# Patient Record
Sex: Female | Born: 1935
Health system: Southern US, Community
[De-identification: ages and names within clinical notes are randomized; demographics above are authoritative.]

## PROBLEM LIST (undated history)

## (undated) DIAGNOSIS — I480 Paroxysmal atrial fibrillation: Secondary | ICD-10-CM

## (undated) DIAGNOSIS — I509 Heart failure, unspecified: Secondary | ICD-10-CM

## (undated) DIAGNOSIS — I10 Essential (primary) hypertension: Secondary | ICD-10-CM

## (undated) DIAGNOSIS — F419 Anxiety disorder, unspecified: Secondary | ICD-10-CM

## (undated) DIAGNOSIS — M8580 Other specified disorders of bone density and structure, unspecified site: Secondary | ICD-10-CM

## (undated) DIAGNOSIS — R0602 Shortness of breath: Secondary | ICD-10-CM

## (undated) DIAGNOSIS — Z7901 Long term (current) use of anticoagulants: Secondary | ICD-10-CM

## (undated) DIAGNOSIS — I313 Pericardial effusion (noninflammatory): Secondary | ICD-10-CM

## (undated) DIAGNOSIS — G459 Transient cerebral ischemic attack, unspecified: Secondary | ICD-10-CM

## (undated) DIAGNOSIS — G4700 Insomnia, unspecified: Secondary | ICD-10-CM

## (undated) HISTORY — DX: Essential (primary) hypertension: I10

## (undated) HISTORY — DX: Long term (current) use of anticoagulants: Z79.01

## (undated) HISTORY — DX: Paroxysmal atrial fibrillation: I48.0

## (undated) HISTORY — PX: APPENDECTOMY: SHX54

## (undated) HISTORY — PX: POLYPECTOMY: SHX149

## (undated) HISTORY — PX: CATARACT EXTRACTION: SUR2

## (undated) HISTORY — DX: Other specified disorders of bone density and structure, unspecified site: M85.80

## (undated) HISTORY — DX: Insomnia, unspecified: G47.00

## (undated) HISTORY — DX: Transient cerebral ischemic attack, unspecified: G45.9

## (undated) HISTORY — PX: OTHER SURGICAL HISTORY: SHX169

---

## 1998-01-21 ENCOUNTER — Other Ambulatory Visit: Admission: RE | Admit: 1998-01-21 | Discharge: 1998-01-21 | Payer: Self-pay | Admitting: *Deleted

## 1998-09-17 ENCOUNTER — Emergency Department (HOSPITAL_COMMUNITY): Admission: EM | Admit: 1998-09-17 | Discharge: 1998-09-17 | Payer: Self-pay | Admitting: *Deleted

## 1998-09-17 ENCOUNTER — Encounter: Payer: Self-pay | Admitting: *Deleted

## 1998-09-23 ENCOUNTER — Ambulatory Visit (HOSPITAL_COMMUNITY): Admission: RE | Admit: 1998-09-23 | Discharge: 1998-09-23 | Payer: Self-pay | Admitting: Orthopedic Surgery

## 1998-09-23 ENCOUNTER — Encounter: Payer: Self-pay | Admitting: Orthopedic Surgery

## 1998-09-25 ENCOUNTER — Encounter: Payer: Self-pay | Admitting: Orthopedic Surgery

## 1998-09-25 ENCOUNTER — Ambulatory Visit (HOSPITAL_COMMUNITY): Admission: RE | Admit: 1998-09-25 | Discharge: 1998-09-25 | Payer: Self-pay | Admitting: Orthopedic Surgery

## 1999-09-02 ENCOUNTER — Encounter: Payer: Self-pay | Admitting: Gynecology

## 1999-09-02 ENCOUNTER — Encounter: Admission: RE | Admit: 1999-09-02 | Discharge: 1999-09-02 | Payer: Self-pay | Admitting: Gynecology

## 2000-02-05 ENCOUNTER — Other Ambulatory Visit: Admission: RE | Admit: 2000-02-05 | Discharge: 2000-02-05 | Payer: Self-pay | Admitting: Gynecology

## 2000-09-02 ENCOUNTER — Encounter: Payer: Self-pay | Admitting: Gynecology

## 2000-09-02 ENCOUNTER — Encounter: Admission: RE | Admit: 2000-09-02 | Discharge: 2000-09-02 | Payer: Self-pay | Admitting: Gynecology

## 2001-02-20 ENCOUNTER — Other Ambulatory Visit: Admission: RE | Admit: 2001-02-20 | Discharge: 2001-02-20 | Payer: Self-pay | Admitting: Gynecology

## 2001-09-06 ENCOUNTER — Encounter: Payer: Self-pay | Admitting: Gynecology

## 2001-09-06 ENCOUNTER — Encounter: Admission: RE | Admit: 2001-09-06 | Discharge: 2001-09-06 | Payer: Self-pay | Admitting: Gynecology

## 2002-03-07 ENCOUNTER — Other Ambulatory Visit: Admission: RE | Admit: 2002-03-07 | Discharge: 2002-03-07 | Payer: Self-pay | Admitting: Gynecology

## 2003-01-07 ENCOUNTER — Encounter: Admission: RE | Admit: 2003-01-07 | Discharge: 2003-01-07 | Payer: Self-pay | Admitting: Gynecology

## 2003-04-15 ENCOUNTER — Other Ambulatory Visit: Admission: RE | Admit: 2003-04-15 | Discharge: 2003-04-15 | Payer: Self-pay | Admitting: Gynecology

## 2003-11-30 ENCOUNTER — Emergency Department (HOSPITAL_COMMUNITY): Admission: EM | Admit: 2003-11-30 | Discharge: 2003-11-30 | Payer: Self-pay | Admitting: Family Medicine

## 2003-12-05 ENCOUNTER — Ambulatory Visit: Payer: Self-pay | Admitting: Cardiology

## 2004-01-02 ENCOUNTER — Ambulatory Visit: Payer: Self-pay | Admitting: Cardiology

## 2004-01-30 ENCOUNTER — Ambulatory Visit: Payer: Self-pay | Admitting: Internal Medicine

## 2004-02-27 ENCOUNTER — Ambulatory Visit: Payer: Self-pay | Admitting: Cardiology

## 2004-03-02 ENCOUNTER — Encounter: Admission: RE | Admit: 2004-03-02 | Discharge: 2004-03-02 | Payer: Self-pay | Admitting: Gynecology

## 2004-03-26 ENCOUNTER — Ambulatory Visit: Payer: Self-pay | Admitting: Cardiology

## 2004-04-20 ENCOUNTER — Ambulatory Visit: Payer: Self-pay | Admitting: Internal Medicine

## 2004-04-23 ENCOUNTER — Ambulatory Visit: Payer: Self-pay | Admitting: Internal Medicine

## 2004-05-18 ENCOUNTER — Ambulatory Visit: Payer: Self-pay | Admitting: Cardiology

## 2004-06-15 ENCOUNTER — Ambulatory Visit: Payer: Self-pay | Admitting: Cardiology

## 2004-07-13 ENCOUNTER — Ambulatory Visit: Payer: Self-pay | Admitting: Cardiology

## 2004-08-05 ENCOUNTER — Ambulatory Visit: Payer: Self-pay | Admitting: Cardiology

## 2004-09-04 ENCOUNTER — Ambulatory Visit: Payer: Self-pay | Admitting: Cardiology

## 2004-09-29 ENCOUNTER — Ambulatory Visit: Payer: Self-pay | Admitting: Cardiology

## 2004-10-22 ENCOUNTER — Ambulatory Visit: Payer: Self-pay | Admitting: Internal Medicine

## 2004-10-29 ENCOUNTER — Ambulatory Visit: Payer: Self-pay | Admitting: Cardiology

## 2004-10-29 ENCOUNTER — Ambulatory Visit: Payer: Self-pay

## 2004-11-26 ENCOUNTER — Ambulatory Visit: Payer: Self-pay | Admitting: Cardiology

## 2004-12-09 ENCOUNTER — Ambulatory Visit: Payer: Self-pay | Admitting: Internal Medicine

## 2004-12-28 ENCOUNTER — Ambulatory Visit: Payer: Self-pay | Admitting: Cardiology

## 2005-01-21 ENCOUNTER — Ambulatory Visit: Payer: Self-pay | Admitting: *Deleted

## 2005-02-03 ENCOUNTER — Ambulatory Visit: Payer: Self-pay | Admitting: Internal Medicine

## 2005-02-04 ENCOUNTER — Ambulatory Visit: Payer: Self-pay | Admitting: Cardiology

## 2005-02-18 ENCOUNTER — Ambulatory Visit: Payer: Self-pay | Admitting: Cardiology

## 2005-03-18 ENCOUNTER — Ambulatory Visit: Payer: Self-pay | Admitting: Cardiology

## 2005-03-22 ENCOUNTER — Encounter: Admission: RE | Admit: 2005-03-22 | Discharge: 2005-03-22 | Payer: Self-pay | Admitting: Gynecology

## 2005-04-08 ENCOUNTER — Ambulatory Visit: Payer: Self-pay | Admitting: Cardiology

## 2005-05-07 ENCOUNTER — Ambulatory Visit: Payer: Self-pay | Admitting: Cardiology

## 2005-06-04 ENCOUNTER — Ambulatory Visit: Payer: Self-pay | Admitting: Internal Medicine

## 2005-06-16 ENCOUNTER — Ambulatory Visit: Payer: Self-pay | Admitting: Internal Medicine

## 2005-06-16 ENCOUNTER — Ambulatory Visit: Payer: Self-pay | Admitting: Cardiology

## 2005-07-12 ENCOUNTER — Other Ambulatory Visit: Admission: RE | Admit: 2005-07-12 | Discharge: 2005-07-12 | Payer: Self-pay | Admitting: Gynecology

## 2005-07-14 ENCOUNTER — Ambulatory Visit: Payer: Self-pay | Admitting: *Deleted

## 2005-07-30 ENCOUNTER — Ambulatory Visit: Payer: Self-pay | Admitting: Internal Medicine

## 2005-08-06 ENCOUNTER — Ambulatory Visit: Payer: Self-pay | Admitting: Cardiology

## 2005-08-20 ENCOUNTER — Ambulatory Visit: Payer: Self-pay | Admitting: Cardiology

## 2005-09-03 ENCOUNTER — Ambulatory Visit: Payer: Self-pay | Admitting: Cardiology

## 2005-09-20 ENCOUNTER — Ambulatory Visit: Payer: Self-pay | Admitting: Cardiology

## 2005-10-11 ENCOUNTER — Ambulatory Visit: Payer: Self-pay | Admitting: Cardiology

## 2005-11-01 ENCOUNTER — Ambulatory Visit: Payer: Self-pay | Admitting: Internal Medicine

## 2005-11-08 ENCOUNTER — Ambulatory Visit: Payer: Self-pay | Admitting: Cardiology

## 2005-11-18 ENCOUNTER — Ambulatory Visit: Payer: Self-pay | Admitting: Internal Medicine

## 2005-12-06 ENCOUNTER — Ambulatory Visit: Payer: Self-pay | Admitting: Cardiovascular Disease

## 2005-12-23 ENCOUNTER — Ambulatory Visit: Payer: Self-pay | Admitting: Internal Medicine

## 2006-01-06 ENCOUNTER — Ambulatory Visit: Payer: Self-pay | Admitting: Internal Medicine

## 2006-01-20 ENCOUNTER — Ambulatory Visit: Payer: Self-pay | Admitting: Cardiology

## 2006-02-01 ENCOUNTER — Ambulatory Visit: Payer: Self-pay | Admitting: Internal Medicine

## 2006-02-14 ENCOUNTER — Ambulatory Visit: Payer: Self-pay | Admitting: Internal Medicine

## 2006-02-17 ENCOUNTER — Ambulatory Visit: Payer: Self-pay | Admitting: Internal Medicine

## 2006-03-17 ENCOUNTER — Ambulatory Visit: Payer: Self-pay | Admitting: Cardiology

## 2006-04-18 ENCOUNTER — Ambulatory Visit: Payer: Self-pay | Admitting: Cardiology

## 2006-05-03 ENCOUNTER — Encounter: Admission: RE | Admit: 2006-05-03 | Discharge: 2006-05-03 | Payer: Self-pay | Admitting: Internal Medicine

## 2006-05-11 ENCOUNTER — Encounter: Admission: RE | Admit: 2006-05-11 | Discharge: 2006-05-11 | Payer: Self-pay | Admitting: Orthopedic Surgery

## 2006-05-17 ENCOUNTER — Ambulatory Visit: Payer: Self-pay | Admitting: Cardiology

## 2006-06-16 ENCOUNTER — Ambulatory Visit: Payer: Self-pay | Admitting: Cardiology

## 2006-06-16 ENCOUNTER — Ambulatory Visit: Payer: Self-pay | Admitting: Internal Medicine

## 2006-07-01 ENCOUNTER — Ambulatory Visit: Payer: Self-pay | Admitting: Cardiology

## 2006-07-28 ENCOUNTER — Ambulatory Visit: Payer: Self-pay | Admitting: Internal Medicine

## 2006-08-25 ENCOUNTER — Ambulatory Visit: Payer: Self-pay | Admitting: Cardiology

## 2006-09-22 ENCOUNTER — Ambulatory Visit: Payer: Self-pay | Admitting: Cardiology

## 2006-10-20 ENCOUNTER — Ambulatory Visit: Payer: Self-pay | Admitting: Cardiology

## 2006-11-03 ENCOUNTER — Encounter: Payer: Self-pay | Admitting: *Deleted

## 2006-11-03 DIAGNOSIS — N809 Endometriosis, unspecified: Secondary | ICD-10-CM | POA: Insufficient documentation

## 2006-11-03 DIAGNOSIS — M949 Disorder of cartilage, unspecified: Secondary | ICD-10-CM

## 2006-11-03 DIAGNOSIS — I10 Essential (primary) hypertension: Secondary | ICD-10-CM | POA: Insufficient documentation

## 2006-11-03 DIAGNOSIS — M899 Disorder of bone, unspecified: Secondary | ICD-10-CM | POA: Insufficient documentation

## 2006-11-03 DIAGNOSIS — G4700 Insomnia, unspecified: Secondary | ICD-10-CM | POA: Insufficient documentation

## 2006-11-03 DIAGNOSIS — Z9189 Other specified personal risk factors, not elsewhere classified: Secondary | ICD-10-CM | POA: Insufficient documentation

## 2006-11-03 DIAGNOSIS — I4891 Unspecified atrial fibrillation: Secondary | ICD-10-CM | POA: Insufficient documentation

## 2006-11-17 ENCOUNTER — Ambulatory Visit: Payer: Self-pay | Admitting: Cardiology

## 2006-11-23 ENCOUNTER — Ambulatory Visit: Payer: Self-pay | Admitting: Internal Medicine

## 2006-12-01 ENCOUNTER — Ambulatory Visit: Payer: Self-pay | Admitting: Cardiology

## 2006-12-29 ENCOUNTER — Ambulatory Visit: Payer: Self-pay | Admitting: Cardiovascular Disease

## 2007-01-30 ENCOUNTER — Ambulatory Visit: Payer: Self-pay | Admitting: Cardiology

## 2007-02-01 ENCOUNTER — Encounter: Payer: Self-pay | Admitting: Internal Medicine

## 2007-02-07 ENCOUNTER — Encounter: Admission: RE | Admit: 2007-02-07 | Discharge: 2007-02-07 | Payer: Self-pay | Admitting: Surgery

## 2007-02-13 ENCOUNTER — Ambulatory Visit: Payer: Self-pay | Admitting: Cardiology

## 2007-02-27 ENCOUNTER — Ambulatory Visit: Payer: Self-pay | Admitting: Internal Medicine

## 2007-03-30 ENCOUNTER — Ambulatory Visit: Payer: Self-pay | Admitting: Cardiovascular Disease

## 2007-04-27 ENCOUNTER — Ambulatory Visit: Payer: Self-pay | Admitting: Internal Medicine

## 2007-05-03 ENCOUNTER — Ambulatory Visit: Payer: Self-pay | Admitting: Cardiovascular Disease

## 2007-05-16 ENCOUNTER — Ambulatory Visit: Payer: Self-pay | Admitting: Cardiology

## 2007-06-06 ENCOUNTER — Ambulatory Visit: Payer: Self-pay | Admitting: Cardiovascular Disease

## 2007-06-12 ENCOUNTER — Encounter: Admission: RE | Admit: 2007-06-12 | Discharge: 2007-06-12 | Payer: Self-pay | Admitting: Gynecology

## 2007-06-15 ENCOUNTER — Ambulatory Visit: Payer: Self-pay | Admitting: Internal Medicine

## 2007-06-16 ENCOUNTER — Ambulatory Visit: Payer: Self-pay | Admitting: Internal Medicine

## 2007-06-16 DIAGNOSIS — R197 Diarrhea, unspecified: Secondary | ICD-10-CM | POA: Insufficient documentation

## 2007-06-20 ENCOUNTER — Encounter: Payer: Self-pay | Admitting: Internal Medicine

## 2007-06-22 ENCOUNTER — Telehealth: Payer: Self-pay | Admitting: Internal Medicine

## 2007-06-27 ENCOUNTER — Encounter: Payer: Self-pay | Admitting: Internal Medicine

## 2007-07-04 ENCOUNTER — Ambulatory Visit: Payer: Self-pay | Admitting: Internal Medicine

## 2007-07-18 ENCOUNTER — Ambulatory Visit: Payer: Self-pay | Admitting: Cardiovascular Disease

## 2007-08-08 ENCOUNTER — Ambulatory Visit: Payer: Self-pay | Admitting: Internal Medicine

## 2007-08-21 ENCOUNTER — Ambulatory Visit: Payer: Self-pay | Admitting: Cardiovascular Disease

## 2007-08-30 ENCOUNTER — Ambulatory Visit: Payer: Self-pay | Admitting: Internal Medicine

## 2007-08-30 DIAGNOSIS — R1310 Dysphagia, unspecified: Secondary | ICD-10-CM | POA: Insufficient documentation

## 2007-09-04 ENCOUNTER — Ambulatory Visit: Payer: Self-pay | Admitting: Cardiology

## 2007-09-20 ENCOUNTER — Telehealth (INDEPENDENT_AMBULATORY_CARE_PROVIDER_SITE_OTHER): Payer: Self-pay | Admitting: *Deleted

## 2007-09-21 ENCOUNTER — Encounter: Payer: Self-pay | Admitting: Internal Medicine

## 2007-09-25 ENCOUNTER — Ambulatory Visit: Payer: Self-pay | Admitting: Cardiology

## 2007-09-26 ENCOUNTER — Encounter: Payer: Self-pay | Admitting: Internal Medicine

## 2007-09-26 ENCOUNTER — Ambulatory Visit (HOSPITAL_COMMUNITY): Admission: RE | Admit: 2007-09-26 | Discharge: 2007-09-26 | Payer: Self-pay | Admitting: Internal Medicine

## 2007-09-28 ENCOUNTER — Encounter: Payer: Self-pay | Admitting: Internal Medicine

## 2007-10-03 ENCOUNTER — Encounter (INDEPENDENT_AMBULATORY_CARE_PROVIDER_SITE_OTHER): Payer: Self-pay | Admitting: *Deleted

## 2007-10-23 ENCOUNTER — Ambulatory Visit: Payer: Self-pay | Admitting: Cardiovascular Disease

## 2007-10-27 ENCOUNTER — Ambulatory Visit: Payer: Self-pay | Admitting: Gastroenterology

## 2007-11-03 ENCOUNTER — Ambulatory Visit: Payer: Self-pay | Admitting: Cardiology

## 2007-11-15 ENCOUNTER — Telehealth: Payer: Self-pay | Admitting: Internal Medicine

## 2007-11-17 ENCOUNTER — Ambulatory Visit: Payer: Self-pay | Admitting: Cardiology

## 2007-12-08 ENCOUNTER — Ambulatory Visit: Payer: Self-pay | Admitting: Internal Medicine

## 2007-12-15 ENCOUNTER — Ambulatory Visit: Payer: Self-pay | Admitting: Cardiovascular Disease

## 2008-01-04 ENCOUNTER — Ambulatory Visit: Payer: Self-pay | Admitting: Cardiology

## 2008-02-01 ENCOUNTER — Ambulatory Visit: Payer: Self-pay | Admitting: Cardiovascular Disease

## 2008-02-21 ENCOUNTER — Telehealth: Payer: Self-pay | Admitting: Internal Medicine

## 2008-02-22 ENCOUNTER — Ambulatory Visit: Payer: Self-pay | Admitting: Internal Medicine

## 2008-02-22 DIAGNOSIS — R310 Gross hematuria: Secondary | ICD-10-CM | POA: Insufficient documentation

## 2008-02-22 LAB — CONVERTED CEMR LAB
Ketones, ur: NEGATIVE mg/dL
Urine Glucose: NEGATIVE mg/dL
Urobilinogen, UA: 0.2 (ref 0.0–1.0)

## 2008-02-27 ENCOUNTER — Encounter: Payer: Self-pay | Admitting: Internal Medicine

## 2008-02-29 ENCOUNTER — Ambulatory Visit: Payer: Self-pay | Admitting: Internal Medicine

## 2008-03-22 ENCOUNTER — Encounter: Payer: Self-pay | Admitting: Internal Medicine

## 2008-03-28 ENCOUNTER — Ambulatory Visit: Payer: Self-pay | Admitting: Cardiovascular Disease

## 2008-04-23 ENCOUNTER — Ambulatory Visit: Payer: Self-pay | Admitting: Cardiology

## 2008-05-21 ENCOUNTER — Ambulatory Visit: Payer: Self-pay | Admitting: Cardiology

## 2008-06-04 ENCOUNTER — Ambulatory Visit: Payer: Self-pay | Admitting: Internal Medicine

## 2008-06-18 ENCOUNTER — Encounter: Admission: RE | Admit: 2008-06-18 | Discharge: 2008-06-18 | Payer: Self-pay | Admitting: Gynecology

## 2008-06-18 ENCOUNTER — Ambulatory Visit: Payer: Self-pay | Admitting: Cardiology

## 2008-06-25 ENCOUNTER — Encounter: Payer: Self-pay | Admitting: *Deleted

## 2008-07-09 ENCOUNTER — Encounter (INDEPENDENT_AMBULATORY_CARE_PROVIDER_SITE_OTHER): Payer: Self-pay | Admitting: Cardiology

## 2008-07-09 ENCOUNTER — Ambulatory Visit: Payer: Self-pay | Admitting: Internal Medicine

## 2008-07-09 LAB — CONVERTED CEMR LAB: Protime: 17.1

## 2008-07-23 ENCOUNTER — Ambulatory Visit: Payer: Self-pay | Admitting: Internal Medicine

## 2008-07-31 ENCOUNTER — Encounter: Payer: Self-pay | Admitting: *Deleted

## 2008-08-08 ENCOUNTER — Ambulatory Visit: Payer: Self-pay | Admitting: Cardiology

## 2008-08-08 LAB — CONVERTED CEMR LAB
POC INR: 3.1
Prothrombin Time: 21.4 s

## 2008-09-04 ENCOUNTER — Encounter: Payer: Self-pay | Admitting: Internal Medicine

## 2008-09-05 ENCOUNTER — Ambulatory Visit: Payer: Self-pay | Admitting: Internal Medicine

## 2008-10-03 ENCOUNTER — Ambulatory Visit: Payer: Self-pay | Admitting: Cardiovascular Disease

## 2008-10-03 LAB — CONVERTED CEMR LAB: POC INR: 3.5

## 2008-10-31 ENCOUNTER — Ambulatory Visit: Payer: Self-pay | Admitting: Internal Medicine

## 2008-10-31 LAB — CONVERTED CEMR LAB: POC INR: 2.2

## 2008-11-18 ENCOUNTER — Ambulatory Visit: Payer: Self-pay | Admitting: Internal Medicine

## 2008-11-27 ENCOUNTER — Ambulatory Visit: Payer: Self-pay | Admitting: Cardiology

## 2008-12-26 ENCOUNTER — Ambulatory Visit: Payer: Self-pay | Admitting: Cardiovascular Disease

## 2009-01-22 ENCOUNTER — Ambulatory Visit: Payer: Self-pay | Admitting: Cardiology

## 2009-02-18 ENCOUNTER — Ambulatory Visit: Payer: Self-pay | Admitting: Internal Medicine

## 2009-02-19 ENCOUNTER — Ambulatory Visit: Payer: Self-pay | Admitting: Cardiology

## 2009-03-19 ENCOUNTER — Ambulatory Visit: Payer: Self-pay | Admitting: Internal Medicine

## 2009-04-01 ENCOUNTER — Encounter: Payer: Self-pay | Admitting: Internal Medicine

## 2009-04-16 ENCOUNTER — Ambulatory Visit: Payer: Self-pay | Admitting: Internal Medicine

## 2009-05-14 ENCOUNTER — Ambulatory Visit: Payer: Self-pay | Admitting: Cardiology

## 2009-05-14 LAB — CONVERTED CEMR LAB: POC INR: 2

## 2009-06-10 ENCOUNTER — Ambulatory Visit: Payer: Self-pay | Admitting: Internal Medicine

## 2009-06-10 LAB — CONVERTED CEMR LAB: POC INR: 2.4

## 2009-06-25 ENCOUNTER — Encounter: Admission: RE | Admit: 2009-06-25 | Discharge: 2009-06-25 | Payer: Self-pay | Admitting: Gynecology

## 2009-07-09 ENCOUNTER — Ambulatory Visit: Payer: Self-pay | Admitting: Internal Medicine

## 2009-08-06 ENCOUNTER — Ambulatory Visit: Payer: Self-pay | Admitting: Internal Medicine

## 2009-08-06 LAB — CONVERTED CEMR LAB: POC INR: 2.2

## 2009-08-11 ENCOUNTER — Ambulatory Visit: Payer: Self-pay | Admitting: Internal Medicine

## 2009-08-11 LAB — CONVERTED CEMR LAB
Basophils Relative: 0.5 % (ref 0.0–3.0)
Calcium: 9.5 mg/dL (ref 8.4–10.5)
GFR calc non Af Amer: 87.48 mL/min (ref >60)
Hemoglobin: 14.6 g/dL (ref 12.0–15.0)
Lymphocytes Relative: 33.4 % (ref 12.0–46.0)
Monocytes Relative: 8.4 % (ref 3.0–12.0)
Neutro Abs: 4 10*3/uL (ref 1.4–7.7)
RBC: 4.2 M/uL (ref 3.87–5.11)
Sodium: 141 meq/L (ref 135–145)
TSH: 0.96 microintl units/mL (ref 0.35–5.50)
Vitamin B-12: >1500 pg/mL — ABNORMAL HIGH (ref 211–911)

## 2009-09-04 ENCOUNTER — Ambulatory Visit: Payer: Self-pay | Admitting: Cardiovascular Disease

## 2009-09-22 ENCOUNTER — Ambulatory Visit: Payer: Self-pay | Admitting: Cardiology

## 2009-09-22 LAB — CONVERTED CEMR LAB: POC INR: 2.3

## 2009-10-20 ENCOUNTER — Ambulatory Visit: Payer: Self-pay | Admitting: Cardiovascular Disease

## 2009-11-17 ENCOUNTER — Ambulatory Visit: Payer: Self-pay | Admitting: Internal Medicine

## 2009-12-08 ENCOUNTER — Telehealth: Payer: Self-pay | Admitting: Internal Medicine

## 2009-12-15 ENCOUNTER — Ambulatory Visit: Payer: Self-pay | Admitting: Cardiovascular Disease

## 2009-12-15 LAB — CONVERTED CEMR LAB: POC INR: 2.1

## 2010-01-09 ENCOUNTER — Telehealth: Payer: Self-pay | Admitting: Internal Medicine

## 2010-01-09 ENCOUNTER — Ambulatory Visit: Payer: Self-pay | Admitting: Internal Medicine

## 2010-01-09 DIAGNOSIS — F341 Dysthymic disorder: Secondary | ICD-10-CM | POA: Insufficient documentation

## 2010-01-12 ENCOUNTER — Ambulatory Visit: Payer: Self-pay | Admitting: Internal Medicine

## 2010-01-12 LAB — CONVERTED CEMR LAB: INR: 2.3

## 2010-02-09 ENCOUNTER — Ambulatory Visit: Admission: RE | Admit: 2010-02-09 | Discharge: 2010-02-09 | Payer: Self-pay | Source: Home / Self Care

## 2010-02-15 ENCOUNTER — Encounter: Payer: Self-pay | Admitting: Orthopedic Surgery

## 2010-02-24 NOTE — Assessment & Plan Note (Signed)
Summary: 1 year return.amber      Allergies Added: NKDA  Primary Provider:  Norins  CC:  1 year return/  Pt feeling good but is unhappy with weight gain.  Marland Kitchen  History of Present Illness: Selena Taylor is seen in followup for paroxysmal atrial fibrillation which is largely quiet.   They have recently moved to [facility] loft.He is in respite care at [facility].  she is adjusting to her husband's dementia and comes in   much more relaxed.  she had no palpitations and has no problems with chest pain shortness of breath or peripheral edema  Current Medications (verified): 1)  Coumadin 5 Mg  Tabs (Warfarin Sodium) .... Take As Directed By Coumadin Clinic. 2)  Altace 5 Mg  Tabs (Ramipril) .... Take One Tablet Once Daily 3)  Cardizem Cd 240 Mg  Cp24 (Diltiazem Hcl Coated Beads) .... Take One Tablet Once Daily 4)  Multivitamins   Tabs (Multiple Vitamin) .... Take One Tablet Once Daily 5)  Fish Oil 1000 Mg  Caps (Omega-3 Fatty Acids) .... Take One Capsule Daily 6)  Vitamin B .... Take One Tablet Once Daily 7)  Grape Seed .... Take One Tablet Once Daily 8)  Calcium 500 500 Mg  Tabs (Calcium Carbonate) .... Take One Tablet Once Daily 9)  Vitamin D 1000 Unit  Tabs (Cholecalciferol) .... Take One Tablet Once Daily 10)  Ambien 10 Mg  Tabs (Zolpidem Tartrate) .... Take One Tablet At Bedtime  Allergies (verified): No Known Drug Allergies  Past History:  Past Medical History: Last updated: 06/03/2008 Current Problems:  TRANSIENT ISCHEMIC ATTACKS, HX OF (ICD-V12.50) PAROXYSMAL ATRIAL FIBRILLATION (ICD-427.31) HYPERTENSION (ICD-401.9) GROSS HEMATURIA (ICD-599.71) DYSPHAGIA (ICD-787.20) DIARRHEA (ICD-787.91) POLYPECTOMY, HX OF (ICD-V15.9) Hx of ENDOMETRIOSIS (ICD-617.9) OSTEOPENIA (ICD-733.90) INSOMNIA, CHRONIC (ICD-307.42) TOTAL HYSTERECTOMY AND BILATERAL SALPINGOOPHERECTOMY, HX OF (ICD-V45.77)  Past Surgical History: Last updated: 10/27/2007 POLYPECTOMY, HX OF (ICD-V15.9) TOTAL HYSTERECTOMY  AND BILATERAL SALPINGOOPHERECTOMY, HX OF (ICD-V45.77) Appendectomy Skin Cancer Removal  Family History: Last updated: 10/27/2007 Myloma: Mother No FH of Colon Cancer:  Social History: Last updated: 10/27/2007 Occupation: Retired Patient has never smoked.  Alcohol Use - yes -wine Illicit Drug Use - no  Vital Signs:  Patient profile:   75 year old female Height:      62 inches Weight:      127 pounds BMI:     23.31 Pulse rate:   69 / minute Pulse rhythm:   regular BP sitting:   128 / 72  (left arm) Cuff size:   regular  Vitals Entered By: Selena Taylor CMA (Jun 10, 2009 10:51 AM)  Physical Exam  General:  The patient was alert and oriented in no acute distress. HEENT Normal.  Neck veins were flat, carotids were brisk.  Lungs were clear.  Heart sounds were regular without murmurs or gallops.  Abdomen was soft with active bowel sounds. There is no clubbing cyanosis or edema. Skin Warm and dry    EKG  Procedure date:  06/10/2009  Findings:      sinus rhythm at 69 Intervals 0.14/0.08/0.42 Axis LX Isolated PVC  EKG  Procedure date:  06/10/2009  Findings:      again isolated PAC  Impression & Recommendations:  Problem # 1:  PAROXYSMAL ATRIAL FIBRILLATION (ICD-427.31) the patient is maintaining sinus rhythm as best as we know. Her prior TIA she is on Coumadin and tolerating that well.  One issue that we will need to discuss his whether she would like take Pradaxa. Her updated medication  list for this problem includes:    Coumadin 5 Mg Tabs (Warfarin sodium) .Marland Kitchen... Take as directed by coumadin clinic.  Orders: EKG w/ Interpretation (93000)  Problem # 2:  TRANSIENT ISCHEMIC ATTACKS, HX OF (ICD-V12.50) as above  Patient Instructions: 1)  Your physician wants you to follow-up in:  12 months with Dr Graciela Husbands. You will receive a reminder letter in the mail two months in advance. If you don't receive a letter, please call our office to schedule the follow-up  appointment.

## 2010-02-24 NOTE — Medication Information (Signed)
Summary: rov/ez  Anticoagulant Therapy  Managed by: Eda Keys, PharmD Referring MD: Sherryl Manges MD PCP: Link Snuffer MD: Ladona Ridgel MD, Sharlot Gowda Indication 1: Atrial Fibrillation (ICD-427.31) Lab Used: [facility] Site: [facility] INR POC 2.5 INR RANGE 2 - 3  Dietary changes: no    Health status changes: no    Bleeding/hemorrhagic complications: no    Recent/future hospitalizations: no    Any changes in medication regimen? no    Recent/future dental: no  Any missed doses?: no       Is patient compliant with meds? yes       Allergies: No Known Drug Allergies  Anticoagulation Management History:      The patient is taking warfarin and comes in today for a routine follow up visit.  Positive risk factors for bleeding include an age of 33 years or older.  The bleeding index is 'intermediate risk'.  Positive CHADS2 values include History of HTN.  Negative CHADS2 values include Age > 84 years old.  The start date was 01/31/2001.  Anticoagulation responsible provider: Ladona Ridgel MD, Sharlot Gowda.  INR POC: 2.5.  Cuvette Lot#: [id number].  Exp: 04/2010.    Anticoagulation Management Assessment/Plan:      The patient's current anticoagulation dose is Coumadin 5 mg  tabs: Take as directed by coumadin clinic..  The target INR is 2.0-3.0.  The next INR is due 04/16/2009.  Anticoagulation instructions were given to patient.  Results were reviewed/authorized by Eda Keys, PharmD.  She was notified by Eda Keys.         Prior Anticoagulation Instructions: INR: 2.0 Take extra 1/2 tablet tomorrow then resume to same dosage of 1/2 tablet daily except 1 tablet on Mondays, Wednesdays and Fridays Recheck in 4 weeks  Current Anticoagulation Instructions: INR 2.5  Continue taking 1 tablet on Monday, Wednesday, and Friday and take 1/2 tablet all other days.  Return to clinic in 4 weeks.

## 2010-02-24 NOTE — Medication Information (Signed)
Summary: rov/sp      Allergies Added: NKDA Anticoagulant Therapy  Managed by: Earvin Hansen, Pharm D Referring MD: Sherryl Manges MD PCP: Link Snuffer MD: Excell Seltzer MD, Casimiro Needle Indication 1: Atrial Fibrillation (ICD-427.31) Lab Used: [facility] Site: [facility] INR POC 2.6 INR RANGE 2 - 3  Dietary changes: no    Health status changes: no    Bleeding/hemorrhagic complications: no    Recent/future hospitalizations: no    Any changes in medication regimen? no    Recent/future dental: no  Any missed doses?: no       Is patient compliant with meds? yes       Current Medications (verified): 1)  Coumadin 5 Mg  Tabs (Warfarin Sodium) .... Take As Directed By Coumadin Clinic. 2)  Altace 5 Mg  Tabs (Ramipril) .... Take One Tablet Once Daily 3)  Cardizem Cd 240 Mg  Cp24 (Diltiazem Hcl Coated Beads) .... Take One Tablet Once Daily 4)  Multivitamins   Tabs (Multiple Vitamin) .... Take One Tablet Once Daily 5)  Fish Oil 1000 Mg  Caps (Omega-3 Fatty Acids) .... Take One Capsule Daily 6)  Vitamin B .... Take One Tablet Once Daily 7)  Grape Seed .... Take One Tablet Once Daily 8)  Calcium 500 500 Mg  Tabs (Calcium Carbonate) .... Take One Tablet Once Daily 9)  Vitamin D 1000 Unit  Tabs (Cholecalciferol) .... Take One Tablet Once Daily 10)  Ambien 10 Mg  Tabs (Zolpidem Tartrate) .... Take One Tablet At Bedtime  Allergies (verified): No Known Drug Allergies  Anticoagulation Management History:      Positive risk factors for bleeding include an age of 72 years or older.  The bleeding index is 'intermediate risk'.  Positive CHADS2 values include History of HTN.  Negative CHADS2 values include Age > 61 years old.  The start date was 01/31/2001.  Anticoagulation responsible provider: Excell Seltzer MD, Casimiro Needle.  INR POC: 2.6.  Exp: 10/2010.    Anticoagulation Management Assessment/Plan:      The patient's current anticoagulation dose is Coumadin 5 mg  tabs: Take as directed by coumadin  clinic..  The target INR is 2.0-3.0.  The next INR is due 11/17/2009.  Anticoagulation instructions were given to patient.  Results were reviewed/authorized by Earvin Hansen, Pharm D.  She was notified by Earvin Hansen PharmD.         Prior Anticoagulation Instructions: INR 2.3  Continue taking 1/2 tablet (2.5mg ) every day except take 1 tablet (5mg ) on Mondays, Wedensdays, and Fridays.  Recheck 9/26.   Current Anticoagulation Instructions: Continue taking 1/2 tablet (2.5 mg) daily except for 1 tablet (5 mg) on Mondays, Wednesdays, and Fridays.

## 2010-02-24 NOTE — Medication Information (Signed)
Summary: rov/tm  Anticoagulant Therapy  Managed by: Bethena Midget, RN, BSN Referring MD: Sherryl Manges MD PCP: Link Snuffer MD: Johney Frame MD, Fayrene Fearing Indication 1: Atrial Fibrillation (ICD-427.31) Lab Used: [facility] Site: [facility] INR POC 2.2 INR RANGE 2 - 3  Dietary changes: no    Health status changes: no    Bleeding/hemorrhagic complications: no    Recent/future hospitalizations: no    Any changes in medication regimen? no    Recent/future dental: no  Any missed doses?: no       Is patient compliant with meds? yes       Allergies: No Known Drug Allergies  Anticoagulation Management History:      The patient is taking warfarin and comes in today for a routine follow up visit.  Positive risk factors for bleeding include an age of 73 years or older.  The bleeding index is 'intermediate risk'.  Positive CHADS2 values include History of HTN.  Negative CHADS2 values include Age > 5 years old.  The start date was 01/31/2001.  Anticoagulation responsible provider: Annlouise Gerety MD, Fayrene Fearing.  INR POC: 2.2.  Cuvette Lot#: [id number].  Exp: 09/2010.    Anticoagulation Management Assessment/Plan:      The patient's current anticoagulation dose is Coumadin 5 mg  tabs: Take as directed by coumadin clinic..  The target INR is 2.0-3.0.  The next INR is due 09/03/2009.  Anticoagulation instructions were given to patient.  Results were reviewed/authorized by Bethena Midget, RN, BSN.  She was notified by Bethena Midget, RN, BSN.         Prior Anticoagulation Instructions: INR 2.6 Continue 2.5mg s daily except 5mg s on Mondays, Wednesdays and Fridays. Recheck in 4 weeks.   Current Anticoagulation Instructions: INR 2.2 Continue 2.5mg s daily except 5mg s on Mondays, Wednesdays and Fridays. Recheck in 4 weeks.

## 2010-02-24 NOTE — Medication Information (Signed)
Summary: rov/ewj  Anticoagulant Therapy  Managed by: Bethena Midget, RN, BSN Referring MD: Sherryl Manges MD PCP: Link Snuffer MD: Shirlee Latch MD, Desi Rowe Indication 1: Atrial Fibrillation (ICD-427.31) Lab Used: [facility] Site: [facility] INR POC 2.0 INR RANGE 2 - 3  Dietary changes: no    Health status changes: no    Bleeding/hemorrhagic complications: no    Recent/future hospitalizations: no    Any changes in medication regimen? no    Recent/future dental: no  Any missed doses?: no       Is patient compliant with meds? yes       Allergies: No Known Drug Allergies  Anticoagulation Management History:      The patient is taking warfarin and comes in today for a routine follow up visit.  Positive risk factors for bleeding include an age of 73 years or older.  The bleeding index is 'intermediate risk'.  Positive CHADS2 values include History of HTN.  Negative CHADS2 values include Age > 29 years old.  The start date was 01/31/2001.  Anticoagulation responsible provider: Shirlee Latch MD, Shama Monfils.  INR POC: 2.0.  Cuvette Lot#: [id number].  Exp: 06/2010.    Anticoagulation Management Assessment/Plan:      The patient's current anticoagulation dose is Coumadin 5 mg  tabs: Take as directed by coumadin clinic..  The target INR is 2.0-3.0.  The next INR is due 06/10/2009.  Anticoagulation instructions were given to patient.  Results were reviewed/authorized by Bethena Midget, RN, BSN.  She was notified by Bethena Midget, RN, BSN.         Prior Anticoagulation Instructions: INR 2.0  Take 1 tablet tomorrow then resume same dosage 1/2 tablet daily except 1 tablet on Mondays, Wednesdays, and Fridays.  Recheck in 4 weeks.    Current Anticoagulation Instructions: INR 2.0 Continue 2.5mg s daily except 5mg s on Mondays, Wednesdays and Fridays. Recheck in 4 weeks.

## 2010-02-24 NOTE — Assessment & Plan Note (Signed)
Summary: SHINGLES SHOT/AETNA,MEDICARE/LB - coming at 9:30 am/pt called...   Nurse Visit   Allergies: No Known Drug Allergies  Immunizations Administered:  Zostavax # 1:    Vaccine Type: Zostavax    Site: left deltoid    Mfr: Merck    Dose: 0.5 ml    Route: [place]    Given by: Lucious Groves    Exp. Date: 02/21/2010    Lot #: [id number]    VIS given: 11/06/04 given February 18, 2009.  Orders Added: 1)  Zoster (Shingles) Vaccine Live [90736] 2)  Admin 1st Vaccine [id number]

## 2010-02-24 NOTE — Assessment & Plan Note (Signed)
Summary: yearly f/u / medicare / labs after - pt aware/cd   Vital Signs:  Patient profile:   75 year old female Height:      62 inches Weight:      125 pounds BMI:     22.95 O2 Sat:      97 % on Room air Temp:     98.1 degrees F oral Pulse rate:   69 / minute BP sitting:   118 / 70  (left arm) Cuff size:   regular  Vitals Entered By: Bill Salinas CMA (August 11, 2009 2:26 PM)  O2 Flow:  Room air  Vision Screening:      Vision Comments: Last eye exam was Aug. 2010 with normal exam   Primary Care Provider:  Taiwo Fish   History of Present Illness: Patient presents for medical follow-up. 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 She feels safe, there has been no violence, but she is seeing that she will not be able to keep up this role indefinitely.  She has  been to Gyn and had a normal exam in the fall. She has had a mammogram.  She is current with Dr. Graciela Husbands and she cointinues on coumadin that has been well controlled.   She has not had colonoscopy. We discussed the value of this study and that she can be bridged with lovenox for the procedure.   She is current with immunizations.   Allergies: No Known Drug Allergies  Past History:  Past Medical History: Last updated: 06/03/2008 Current Problems:  TRANSIENT ISCHEMIC ATTACKS, HX OF (ICD-V12.50) PAROXYSMAL ATRIAL FIBRILLATION (ICD-427.31) HYPERTENSION (ICD-401.9) GROSS HEMATURIA (ICD-599.71) DYSPHAGIA (ICD-787.20) DIARRHEA (ICD-787.91) POLYPECTOMY, HX OF (ICD-V15.9) Hx of ENDOMETRIOSIS (ICD-617.9) OSTEOPENIA (ICD-733.90) INSOMNIA, CHRONIC (ICD-307.42) TOTAL HYSTERECTOMY AND BILATERAL SALPINGOOPHERECTOMY, HX OF (ICD-V45.77)  Past Surgical History: Last updated: 10/27/2007 POLYPECTOMY, HX OF (ICD-V15.9) TOTAL HYSTERECTOMY AND BILATERAL SALPINGOOPHERECTOMY, HX OF (ICD-V45.77) Appendectomy Skin Cancer Removal  Family History: Myloma: Mother Father- deceased @87 :  Pneumonia after CABG Brother - had myloma- in remission for 8 years ('11) No FH of Colon Cancer:  Social History: HS[employer]; Became a stewardness Married - 1959 1 son - '65; 1 daughter '60; 2 grandchildren 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 End of life Care: no DNR, DNI, no futile or heroic measures. Patient has never smoked.  Alcohol Use - yes -wine Illicit Drug Use - no  Review of Systems       The patient complains of weight gain.  The patient denies anorexia, fever, weight loss, vision loss, decreased hearing, chest pain, syncope, dyspnea on exertion, prolonged cough, headaches, abdominal pain, severe indigestion/heartburn, incontinence, difficulty walking, depression, abnormal bleeding, enlarged lymph nodes, and breast masses.         nocturia  Physical Exam  General:  WNWD well groomed white female in no distress Head:  Normocephalic and atraumatic without obvious abnormalities.  Eyes:  vision grossly intact, pupils equal, pupils round, corneas and lenses clear, no injection, no optic disk abnormalities, and no retinal abnormalitiies.   Ears:  External ear exam shows no significant lesions or deformities.  Otoscopic examination reveals clear canals, tympanic membranes are intact bilaterally without bulging, retraction, inflammation or discharge. Hearing is grossly normal bilaterally. Nose:  no external deformity and no external erythema.   Mouth:  Oral mucosa and oropharynx without lesions or exudates.  Teeth in good repair. Neck:  supple, full ROM, no thyromegaly, and no carotid bruits.   Chest Wall:  no  deformities and no tenderness.   Breasts:  deferred to gyn Lungs:  Normal respiratory effort, chest expands symmetrically. Lungs are clear to auscultation, no crackles or wheezes. Heart:  Normal rate and regular rhythm with frequent PVCs. S1 and S2 normal without gallop, murmur, click, rub or other extra sounds. Abdomen:  soft, non-tender, normal bowel  sounds, no distention, no guarding, and no hepatomegaly.   Genitalia:  deferred to gyn Msk:  normal ROM, no joint tenderness, no joint swelling, no joint warmth, and no joint deformities.   Pulses:  2+ radial and DP pulses Extremities:  No clubbing, cyanosis, edema, or deformity noted with normal full range of motion of all joints.   Neurologic:  alert & oriented X3, cranial nerves II-XII intact, gait normal, and DTRs symmetrical and normal.   Skin:  turgor normal and color normal.  Fragil skin - full exam deferred to dermatology Cervical Nodes:  no anterior cervical adenopathy and no posterior cervical adenopathy.   Psych:  Oriented X3, normally interactive, good eye contact, and not anxious appearing.     Impression & Recommendations:  Problem # 1:  TRANSIENT ISCHEMIC ATTACKS, HX OF (ICD-V12.50) Very stable with no events. she is on full anticoagulation.   Problem # 2:  PAROXYSMAL ATRIAL FIBRILLATION (ICD-427.31)  In sinus rhythm today. she has recently seen Dr. Graciela Husbands who felt she was doing well. She is fully anticoagulated.  Her updated medication list for this problem includes:    Coumadin 5 Mg Tabs (Warfarin sodium) .Marland Kitchen... Take as directed by coumadin clinic.    Cardizem Cd 240 Mg Cp24 (Diltiazem hcl coated beads) .Marland Kitchen... Take one tablet once daily  Orders: TLB-TSH (Thyroid Stimulating Hormone) (84443-TSH)  Problem # 3:  HYPERTENSION (ICD-401.9)  Her updated medication list for this problem includes:    Altace 5 Mg Tabs (Ramipril) .Marland Kitchen... Take one tablet once daily    Cardizem Cd 240 Mg Cp24 (Diltiazem hcl coated beads) .Marland Kitchen... Take one tablet once daily  Orders: TLB-BMP (Basic Metabolic Panel-BMET) (80048-METABOL)  BP today: 118/70 Prior BP: 128/72 (06/10/2009)  Excellent control on present medications - continue the same.   Problem # 4:  Preventive Health Care (ICD-V70.0) Patient with no new medical problems or events. Her exam is normal. Her lab results are within normal  limits. 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 We did discuss the need for intermediate and long-term planning in regard to the care of her husband and the financial nuance of the situation. She is advised to seek expert counsel in this regard.   She is current with her gynecologist. She is current with mammography. She has not had colonoscopy because of her concerns centered on anti-coagulation and stroke prevention I explained the process of bridging and that we can safely manage this should she decide to heed my advice to have colorectal cancer screening.   She is stable emotionally and not depressed, though exhausted. She has no fall risk or injury risk. She does try to exercise several times a week and she is very careful with her diet.  In summary - a very nice woman who is medically stable at this time. She will return as needed or 1 year.   Complete Medication List: 1)  Coumadin 5 Mg Tabs (Warfarin sodium) .... Take as directed by coumadin clinic. 2)  Altace 5 Mg Tabs (Ramipril) .... Take one tablet once daily 3)  Cardizem Cd 240 Mg Cp24 (Diltiazem hcl coated  beads) .... Take one tablet once daily 4)  Multivitamins Tabs (Multiple vitamin) .... Take one tablet once daily 5)  Fish Oil 1000 Mg Caps (Omega-3 fatty acids) .... Take one capsule daily 6)  Vitamin B  .... Take one tablet once daily 7)  Grape Seed  .... Take one tablet once daily 8)  Calcium 500 500 Mg Tabs (Calcium carbonate) .... Take one tablet once daily 9)  Vitamin D 1000 Unit Tabs (Cholecalciferol) .... Take one tablet once daily 10)  Ambien 10 Mg Tabs (Zolpidem tartrate) .... Take one tablet at bedtime  Other Orders: TLB-CBC Platelet - w/Differential (85025-CBCD) TLB-B12 + Folate Pnl (82746_82607-B12/FOL) TD Toxoids IM 7 YR + (16109) Admin 1st Vaccine (60454) Subsequent annual wellness visit with prevention plan (U9811)   Selena Taylor Note: All result statuses are Final  unless otherwise noted.  Tests: (1) BMP (METABOL)   Sodium                    141 mEq/L                   135-145   Potassium                 4.5 mEq/L                   3.5-5.1   Chloride                  108 mEq/L                   96-112   Carbon Dioxide            29 mEq/L                    19-32   Glucose                   91 mg/dL                    91-47   BUN                       13 mg/dL                    8-29   Creatinine                0.7 mg/dL                   5.6-2.1   Calcium                   9.5 mg/dL                   3.0-86.5   GFR                       87.48 mL/min                >60  Tests: (2) CBC Platelet w/Diff (CBCD)   White Cell Count          7.3 K/uL                    4.5-10.5   Red Cell Count            4.20 Mil/uL  3.87-5.11   Hemoglobin                14.6 g/dL                   60.4-54.0   Hematocrit                42.1 %                      36.0-46.0   MCV                  [H]  100.2 fl                    78.0-100.0   MCHC                      34.6 g/dL                   98.1-19.1   RDW                       14.0 %                      11.5-14.6   Platelet Count            207.0 K/uL                  150.0-400.0   Neutrophil %              55.4 %                      43.0-77.0   Lymphocyte %              33.4 %                      12.0-46.0   Monocyte %                8.4 %                       3.0-12.0   Eosinophils%              2.3 %                       0.0-5.0   Basophils %               0.5 %                       0.0-3.0   Neutrophill Absolute      4.0 K/uL                    1.4-7.7   Lymphocyte Absolute       2.4 K/uL                    0.7-4.0   Monocyte Absolute         0.6 K/uL                    0.1-1.0  Eosinophils, Absolute                             0.2 K/uL  0.0-0.7   Basophils Absolute        0.0 K/uL                    0.0-0.1  Tests: (3) TSH (TSH)   FastTSH                   0.96 uIU/mL                  0.35-5.50  Tests: (4) B12 + Folate Panel (B12/FOL)   Vitamin B12          [H]  >1500 pg/mL                 211-911   Folate                    16.7 ng/mL Prescriptions: AMBIEN 10 MG  TABS (ZOLPIDEM TARTRATE) Take one tablet at bedtime  #90 x 3   Entered and Authorized by:   Jacques Navy MD   Signed by:   Jacques Navy MD on 08/11/2009   Method used:   Print then Give to Patient   RxID:   [id number]    Preventive Care Screening  Bone Density:    Date:  06/26/2008    Results:  abnormal std dev   Immunizations Administered:  Tetanus Vaccine:    Vaccine Type: Td    Site: right deltoid    Mfr: Sanofi Pasteur    Dose: 0.5 ml    Route: IM    Given by: Ami Bullins CMA    Exp. Date: 02/26/2011    Lot #: [id number]    VIS given: 12/13/06 version given August 11, 2009.  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 she is able to exercise is clear evidence, along with a normal exam, that Selena Taylor is fully independent in all of her own personal activities of daily living.

## 2010-02-24 NOTE — Medication Information (Signed)
Summary: rov/eac  Anticoagulant Therapy  Managed by: Cloyde Reams, RN, BSN Referring MD: Sherryl Manges MD PCP: Link Snuffer MD: Johney Frame MD, Fayrene Fearing Indication 1: Atrial Fibrillation (ICD-427.31) Lab Used: [facility] Site: [facility] INR POC 2.0 INR RANGE 2 - 3  Dietary changes: no    Health status changes: no    Bleeding/hemorrhagic complications: no    Recent/future hospitalizations: no    Any changes in medication regimen? yes       Details: Pt was on abx since last visit.  Completed a couple weeks ago.    Recent/future dental: no  Any missed doses?: no       Is patient compliant with meds? yes       Allergies (verified): No Known Drug Allergies  Anticoagulation Management History:      The patient is taking warfarin and comes in today for a routine follow up visit.  Positive risk factors for bleeding include an age of 73 years or older.  The bleeding index is 'intermediate risk'.  Positive CHADS2 values include History of HTN.  Negative CHADS2 values include Age > 39 years old.  The start date was 01/31/2001.  Anticoagulation responsible provider: Kanyia Heaslip MD, Fayrene Fearing.  INR POC: 2.0.  Cuvette Lot#: [id number].  Exp: 05/2010.    Anticoagulation Management Assessment/Plan:      The patient's current anticoagulation dose is Coumadin 5 mg  tabs: Take as directed by coumadin clinic..  The target INR is 2.0-3.0.  The next INR is due 05/14/2009.  Anticoagulation instructions were given to patient.  Results were reviewed/authorized by Cloyde Reams, RN, BSN.  She was notified by Cloyde Reams RN.         Prior Anticoagulation Instructions: INR 2.5  Continue taking 1 tablet on Monday, Wednesday, and Friday and take 1/2 tablet all other days.  Return to clinic in 4 weeks.   Current Anticoagulation Instructions: INR 2.0  Take 1 tablet tomorrow then resume same dosage 1/2 tablet daily except 1 tablet on Mondays, Wednesdays, and Fridays.  Recheck in 4 weeks.

## 2010-02-24 NOTE — Medication Information (Signed)
Summary: rov/ewj  Anticoagulant Therapy  Managed by: Leota Sauers, PharmD, BCPS, CPP Referring MD: Sherryl Manges MD PCP: Link Snuffer MD: Excell Seltzer MD, Casimiro Needle Indication 1: Atrial Fibrillation (ICD-427.31) Lab Used: [facility] Site: [facility] INR POC 2.1 INR RANGE 2 - 3  Dietary changes: yes       Details: inc salads  Health status changes: no    Bleeding/hemorrhagic complications: no    Recent/future hospitalizations: no    Any changes in medication regimen? no    Recent/future dental: no  Any missed doses?: no       Is patient compliant with meds? yes       Current Medications (verified): 1)  Coumadin 5 Mg  Tabs (Warfarin Sodium) .... Take As Directed By Coumadin Clinic. 2)  Altace 5 Mg  Tabs (Ramipril) .... Take One Tablet Once Daily 3)  Cardizem Cd 240 Mg  Cp24 (Diltiazem Hcl Coated Beads) .... Take One Tablet Once Daily 4)  Multivitamins   Tabs (Multiple Vitamin) .... Take One Tablet Once Daily 5)  Fish Oil 1000 Mg  Caps (Omega-3 Fatty Acids) .... Take One Capsule Daily 6)  Vitamin B .... Take One Tablet Once Daily 7)  Grape Seed .... Take One Tablet Once Daily 8)  Calcium 500 500 Mg  Tabs (Calcium Carbonate) .... Take One Tablet Once Daily 9)  Vitamin D 1000 Unit  Tabs (Cholecalciferol) .... Take One Tablet Once Daily 10)  Ambien 10 Mg  Tabs (Zolpidem Tartrate) .... Take One Tablet At Bedtime  Allergies (verified): No Known Drug Allergies  Anticoagulation Management History:      The patient is taking warfarin and comes in today for a routine follow up visit.  Positive risk factors for bleeding include an age of 73 years or older.  The bleeding index is 'intermediate risk'.  Positive CHADS2 values include History of HTN.  Negative CHADS2 values include Age > 73 years old.  The start date was 01/31/2001.  Anticoagulation responsible provider: Excell Seltzer MD, Casimiro Needle.  INR POC: 2.1.  Cuvette Lot#: [id number].  Exp: 11/2010.    Anticoagulation Management  Assessment/Plan:      The patient's current anticoagulation dose is Coumadin 5 mg  tabs: Take as directed by coumadin clinic..  The target INR is 2.0-3.0.  The next INR is due 01/12/2010.  Anticoagulation instructions were given to patient.  Results were reviewed/authorized by Leota Sauers, PharmD, BCPS, CPP.         Prior Anticoagulation Instructions: INR 2.2  Continue on same dosage 1/2 tablet daily except 1 tablet on Mondays, Wednesdays, and Fridays.  Recheck in 4 weeks.  Current Anticoagulation Instructions: INR 2.1  Couamdin 5mg  tabs, 1 tab MON,WED, FRI 1/2 tab all other days

## 2010-02-24 NOTE — Medication Information (Signed)
Summary: rov/tm  Anticoagulant Therapy  Managed by: Shelby Dubin, PharmD, BCPS, CPP Referring MD: Sherryl Manges MD PCP: Link Snuffer MD: Juanda Chance MD, Wah Sabic Indication 1: Atrial Fibrillation (ICD-427.31) Lab Used: [facility] Site: [facility] INR POC 2.0 INR RANGE 2 - 3  Dietary changes: no    Health status changes: no    Bleeding/hemorrhagic complications: no    Recent/future hospitalizations: no    Any changes in medication regimen? no    Recent/future dental: no  Any missed doses?: no       Is patient compliant with meds? yes       Allergies (verified): No Known Drug Allergies  Anticoagulation Management History:      The patient is taking warfarin and comes in today for a routine follow up visit.  Positive risk factors for bleeding include an age of 75 years or older.  The bleeding index is 'intermediate risk'.  Positive CHADS2 values include History of HTN.  Negative CHADS2 values include Age > 1 years old.  The start date was 01/31/2001.  Anticoagulation responsible provider: Juanda Chance MD, Smitty Cords.  INR POC: 2.0.  Cuvette Lot#: [id number].  Exp: 04/2010.    Anticoagulation Management Assessment/Plan:      The patient's current anticoagulation dose is Coumadin 5 mg  tabs: Take as directed by coumadin clinic..  The target INR is 2.0-3.0.  The next INR is due 03/19/2009.  Anticoagulation instructions were given to patient.  Results were reviewed/authorized by Shelby Dubin, PharmD, BCPS, CPP.  She was notified by Ysidro Evert, Pharm D Candidate.         Prior Anticoagulation Instructions: INR 2.2 Continue 2.5mg s daily except 5mg s on Mondays, Wednesdays and Fridays. Recheck in 4 wks.  Current Anticoagulation Instructions: INR: 2.0 Take extra 1/2 tablet tomorrow then resume to same dosage of 1/2 tablet daily except 1 tablet on Mondays, Wednesdays and Fridays Recheck in 4 weeks

## 2010-02-24 NOTE — Letter (Signed)
Summary: Beather Arbour MD  Beather Arbour MD   Imported By: Sherian Rein 04/14/2009 09:56:17  _____________________________________________________________________  External Attachment:    Type:   Image     Comment:   External Document

## 2010-02-24 NOTE — Medication Information (Signed)
Summary: rov/tm  Anticoagulant Therapy  Managed by: Weston Brass, PharmD Referring MD: Sherryl Manges MD PCP: Link Snuffer MD: Excell Seltzer MD, Casimiro Needle Indication 1: Atrial Fibrillation (ICD-427.31) [facility]  Site: [facility] INR POC 2.4 INR RANGE 2 - 3  Dietary changes: no    Health status changes: no    Bleeding/hemorrhagic complications: no    Recent/future hospitalizations: no    Any changes in medication regimen? no    Recent/future dental: no  Any missed doses?: no       Is patient compliant with meds? yes       Allergies: No Known Drug Allergies  Anticoagulation Management History:      The patient is taking warfarin and comes in today for a routine follow up visit.  Positive risk factors for bleeding include an age of 75 years or older.  The bleeding index is 'intermediate risk'.  Positive CHADS2 values include History of HTN.  Negative CHADS2 values include Age > 19 years old.  The start date was 01/31/2001.  Anticoagulation responsible provider: Excell Seltzer MD, Casimiro Needle.  INR POC: 2.4.  Cuvette Lot#: [id number].  Exp: 10/2010.    Anticoagulation Management Assessment/Plan:      The patient's current anticoagulation dose is Coumadin 5 mg  tabs: Take as directed by coumadin clinic..  The target INR is 2.0-3.0.  The next INR is due 10/01/2009.  Anticoagulation instructions were given to patient.  Results were reviewed/authorized by Weston Brass, PharmD.  She was notified by Liana Gerold, PharmD Candidate.         Prior Anticoagulation Instructions: INR 2.2 Continue 2.5mg s daily except 5mg s on Mondays, Wednesdays and Fridays. Recheck in 4 weeks.   Current Anticoagulation Instructions: INR 2.4  Continue with 1/2 tablet daily except 1 tablet Mon, Wed and Fri.  Return to clinic in 4 weeks.

## 2010-02-24 NOTE — Medication Information (Signed)
Summary: rov/tm  Anticoagulant Therapy  Managed by: Bethena Midget, RN, BSN Referring MD: Sherryl Manges MD PCP: Link Snuffer MD: Gala Romney MD, Reuel Boom Indication 1: Atrial Fibrillation (ICD-427.31) Lab Used: [facility] Site: [facility] INR POC 2.6 INR RANGE 2 - 3  Dietary changes: no    Health status changes: no    Bleeding/hemorrhagic complications: no    Recent/future hospitalizations: no    Any changes in medication regimen? no    Recent/future dental: no  Any missed doses?: no       Is patient compliant with meds? yes       Allergies: No Known Drug Allergies  Anticoagulation Management History:      The patient is taking warfarin and comes in today for a routine follow up visit.  Positive risk factors for bleeding include an age of 73 years or older.  The bleeding index is 'intermediate risk'.  Positive CHADS2 values include History of HTN.  Negative CHADS2 values include Age > 73 years old.  The start date was 01/31/2001.  Anticoagulation responsible provider: Bensimhon MD, Reuel Boom.  INR POC: 2.6.  Cuvette Lot#: [id number].  Exp: 08/2010.    Anticoagulation Management Assessment/Plan:      The patient's current anticoagulation dose is Coumadin 5 mg  tabs: Take as directed by coumadin clinic..  The target INR is 2.0-3.0.  The next INR is due 08/06/2009.  Anticoagulation instructions were given to patient.  Results were reviewed/authorized by Bethena Midget, RN, BSN.  She was notified by Bethena Midget, RN, BSN.         Prior Anticoagulation Instructions: INR 2.4 Continue 2.5mg  everyday except 5mg  on Mondays, Wednesdays and Fridays. Recheck in 4 weeks.   Current Anticoagulation Instructions: INR 2.6 Continue 2.5mg s daily except 5mg s on Mondays, Wednesdays and Fridays. Recheck in 4 weeks.

## 2010-02-24 NOTE — Progress Notes (Signed)
Summary: Selena Taylor  Phone Note Call from Patient Call back at [facility] Phone [phone]   Caller: Patient Summary of Call: Patient called requesting a refill for her Ambeien. She states giving husband some of hers and made her run out quicker. Please adivse Thanks.Alvy Beal Archie CMA  December 08, 2009 1:06 PM   Follow-up for Phone Call        see phone note under Wynn Maudlin. Ok to refill ambien # 90 with 3 refills.  Follow-up by: Jacques Navy MD,  December 08, 2009 1:34 PM  Additional Follow-up for Phone Call Additional follow up Details #1::        Pt informed  Additional Follow-up by: Lamar Sprinkles, CMA,  December 09, 2009 10:25 AM    Additional Follow-up for Phone Call Additional follow up Details #2::    Pt informed, rx to be faxed today Follow-up by: Lamar Sprinkles, CMA,  December 09, 2009 10:26 AM  Prescriptions: AMBIEN 10 MG  TABS (ZOLPIDEM TARTRATE) Take one tablet at bedtime  #90 x 1   Entered by:   Lamar Sprinkles, CMA   Authorized by:   Jacques Navy MD   Signed by:   Lamar Sprinkles, CMA on 12/09/2009   Method used:   Printed then faxed to ...       [facility] (mail-order)             , [place]         Ph: [phone]       Fax: [phone]   RxID:   [id number]

## 2010-02-24 NOTE — Medication Information (Signed)
Summary: Selena Taylor  Anticoagulant Therapy  Managed by: Cloyde Reams, RN, BSN Referring MD: Sherryl Manges MD PCP: Link Snuffer MD: Johney Frame MD, Fayrene Fearing Indication 1: Atrial Fibrillation (ICD-427.31) Lab Used: [facility] Site: [facility] INR POC 2.2 INR RANGE 2 - 3  Dietary changes: yes       Details: Diet has varied, pt moving eating out more.   Health status changes: no    Bleeding/hemorrhagic complications: no    Recent/future hospitalizations: no    Any changes in medication regimen? no    Recent/future dental: no  Any missed doses?: no       Is patient compliant with meds? yes       Allergies: No Known Drug Allergies  Anticoagulation Management History:      The patient is taking warfarin and comes in today for a routine follow up visit.  Positive risk factors for bleeding include an age of 73 years or older.  The bleeding index is 'intermediate risk'.  Positive CHADS2 values include History of HTN.  Negative CHADS2 values include Age > 75 years old.  The start date was 01/31/2001.  Anticoagulation responsible Aleja Yearwood: Allred MD, Fayrene Fearing.  INR POC: 2.2.  Cuvette Lot#: [id number].  Exp: 11/2010.    Anticoagulation Management Assessment/Plan:      The patient's current anticoagulation dose is Coumadin 5 mg  tabs: Take as directed by coumadin clinic..  The target INR is 2.0-3.0.  The next INR is due 12/15/2009.  Anticoagulation instructions were given to patient.  Results were reviewed/authorized by Cloyde Reams, RN, BSN.  She was notified by Cloyde Reams RN.         Prior Anticoagulation Instructions: Continue taking 1/2 tablet (2.5 mg) daily except for 1 tablet (5 mg) on Mondays, Wednesdays, and Fridays.  Current Anticoagulation Instructions: INR 2.2  Continue on same dosage 1/2 tablet daily except 1 tablet on Mondays, Wednesdays, and Fridays.  Recheck in 4 weeks.

## 2010-02-24 NOTE — Assessment & Plan Note (Signed)
Summary: flu shot/men/cd   Nurse Visit   Allergies: No Known Drug Allergies  Orders Added: 1)  Flu Vaccine 44yrs + MEDICARE PATIENTS [Q2039] 2)  Administration Flu vaccine - MCR [G0008]       Flu Vaccine Consent Questions     Do you have a history of severe allergic reactions to this vaccine? no    Any prior history of allergic reactions to egg and/or gelatin? no    Do you have a sensitivity to the preservative Thimersol? no    Do you have a past history of Guillan-Barre Syndrome? no    Do you currently have an acute febrile illness? no    Have you ever had a severe reaction to latex? no    Vaccine information given and explained to patient? yes    Are you currently pregnant? no    Lot Number:[id number]   Exp Date:07/25/2010   Site Given  Left Deltoid IM

## 2010-02-24 NOTE — Medication Information (Signed)
Summary: rov/sp  Anticoagulant Therapy  Managed by: Weston Brass, PharmD Referring MD: Sherryl Manges MD PCP: Link Snuffer MD: Riley Kill MD, Maisie Fus Indication 1: Atrial Fibrillation (ICD-427.31) Lab Used: [facility] Site: [facility] INR POC 2.3 INR RANGE 2 - 3  Dietary changes: no    Health status changes: no    Bleeding/hemorrhagic complications: yes       Details: Pt came in today due to a bruise on her lower right leg.  It looks normal and has started getting better since it happened.    Recent/future hospitalizations: no    Any changes in medication regimen? no    Recent/future dental: no  Any missed doses?: no       Is patient compliant with meds? yes       Allergies: No Known Drug Allergies  Anticoagulation Management History:      The patient is taking warfarin and comes in today for a routine follow up visit.  Positive risk factors for bleeding include an age of 73 years or older.  The bleeding index is 'intermediate risk'.  Positive CHADS2 values include History of HTN.  Negative CHADS2 values include Age > 58 years old.  The start date was 01/31/2001.  Anticoagulation responsible provider: Riley Kill MD, Maisie Fus.  INR POC: 2.3.  Cuvette Lot#: [id number].  Exp: 10/2010.    Anticoagulation Management Assessment/Plan:      The patient's current anticoagulation dose is Coumadin 5 mg  tabs: Take as directed by coumadin clinic..  The target INR is 2.0-3.0.  The next INR is due 10/20/2009.  Anticoagulation instructions were given to patient.  Results were reviewed/authorized by Weston Brass, PharmD.  She was notified by Gweneth Fritter, PharmD Candidate.         Prior Anticoagulation Instructions: INR 2.4  Continue with 1/2 tablet daily except 1 tablet Mon, Wed and Fri.  Return to clinic in 4 weeks.  Current Anticoagulation Instructions: INR 2.3  Continue taking 1/2 tablet (2.5mg ) every day except take 1 tablet (5mg ) on Mondays, Wedensdays, and Fridays.  Recheck 9/26.

## 2010-02-24 NOTE — Medication Information (Signed)
Summary: rov/tm  Anticoagulant Therapy  Managed by: Bethena Midget, RN, BSN Referring MD: Sherryl Manges MD PCP: Link Snuffer MD: Graciela Husbands MD, Viviann Spare Indication 1: Atrial Fibrillation (ICD-427.31) Lab Used: [facility] Site: [facility] INR POC 2.4 INR RANGE 2 - 3  Dietary changes: no    Health status changes: no    Bleeding/hemorrhagic complications: no    Recent/future hospitalizations: no    Any changes in medication regimen? no    Recent/future dental: no  Any missed doses?: no       Is patient compliant with meds? yes      Comments: Seeing Dr Graciela Husbands today   Allergies: No Known Drug Allergies  Anticoagulation Management History:      The patient is taking warfarin and comes in today for a routine follow up visit.  Positive risk factors for bleeding include an age of 73 years or older.  The bleeding index is 'intermediate risk'.  Positive CHADS2 values include History of HTN.  Negative CHADS2 values include Age > 62 years old.  The start date was 01/31/2001.  Anticoagulation responsible provider: Graciela Husbands MD, Viviann Spare.  INR POC: 2.4.  Cuvette Lot#: [id number].  Exp: 08/2010.    Anticoagulation Management Assessment/Plan:      The patient's current anticoagulation dose is Coumadin 5 mg  tabs: Take as directed by coumadin clinic..  The target INR is 2.0-3.0.  The next INR is due 07/09/2009.  Anticoagulation instructions were given to patient.  Results were reviewed/authorized by Bethena Midget, RN, BSN.  She was notified by Bethena Midget, RN, BSN.         Prior Anticoagulation Instructions: INR 2.0 Continue 2.5mg s daily except 5mg s on Mondays, Wednesdays and Fridays. Recheck in 4 weeks.   Current Anticoagulation Instructions: INR 2.4 Continue 2.5mg  everyday except 5mg  on Mondays, Wednesdays and Fridays. Recheck in 4 weeks.

## 2010-02-26 NOTE — Progress Notes (Signed)
Summary: OV NOW  Phone Note Call from Patient   Summary of Call: Pt's daugther called very upset & concerned. Pt woke up this am w/nausea and all over body shakyness. No pain but has some c/o numbness in one hand. She is comming in now for eval.  Initial call taken by: Lamar Sprinkles, CMA,  January 09, 2010 10:24 AM  Follow-up for Phone Call        pt here for appt Follow-up by: Ami Bullins CMA,  January 09, 2010 11:00 AM

## 2010-02-26 NOTE — Medication Information (Signed)
Summary: rov coumadin = [facility]   Anticoagulant Therapy  Managed by: Leota Sauers, PharmD, BCPS, CPP Referring MD: Sherryl Manges MD PCP: Link Snuffer MD: Tenny Craw MD, Gunnar Fusi Indication 1: Atrial Fibrillation (ICD-427.31) Lab Used: [facility] Site: [facility] INR RANGE 2 - 3  Dietary changes: no    Health status changes: no    Bleeding/hemorrhagic complications: no    Recent/future hospitalizations: no    Any changes in medication regimen? no    Recent/future dental: no  Any missed doses?: no       Is patient compliant with meds? yes       Allergies: No Known Drug Allergies  Anticoagulation Management History:      Positive risk factors for bleeding include an age of 73 years or older.  The bleeding index is 'intermediate risk'.  Positive CHADS2 values include History of HTN.  Negative CHADS2 values include Age > 73 years old.  The start date was 01/31/2001.  Today's INR is 2.3.  Anticoagulation responsible provider: Tenny Craw MD, Gunnar Fusi.  Cuvette Lot#: [id number].  Exp: 01/2011.    Anticoagulation Management Assessment/Plan:      The patient's current anticoagulation dose is Coumadin 5 mg  tabs: Take as directed by coumadin clinic..  The target INR is 2.0-3.0.  The next INR is due 02/09/2010.  Anticoagulation instructions were given to patient.  Results were reviewed/authorized by Leota Sauers, PharmD, BCPS, CPP.         Prior Anticoagulation Instructions: INR 2.1  Couamdin 5mg  tabs, 1 tab MON,WED, FRI 1/2 tab all other days  Current Anticoagulation Instructions: INR 2.3 The patient is to continue with the same dose of coumadin.  This dosage includes:  1 tablet (5mg ) on Mon, Wed and Fri 1/2 tablet (2.5mg ) the rest of the days Recheck INR in 4 weeks

## 2010-02-26 NOTE — Medication Information (Signed)
Summary: ROV/tp   Anticoagulant Therapy  Managed by: Geoffry Paradise, PharmD Referring MD: Sherryl Manges MD PCP: Link Snuffer MD: Daleen Squibb MD, Maisie Fus Indication 1: Atrial Fibrillation (ICD-427.31) Lab Used: [facility] Site: [facility] INR RANGE 2 - 3  Dietary changes: no    Health status changes: no    Bleeding/hemorrhagic complications: no    Recent/future hospitalizations: no    Any changes in medication regimen? no    Recent/future dental: no  Any missed doses?: no       Is patient compliant with meds? yes       Allergies: No Known Drug Allergies  Anticoagulation Management History:      Positive risk factors for bleeding include an age of 75 years or older.  The bleeding index is 'intermediate risk'.  Positive CHADS2 values include History of HTN.  Negative CHADS2 values include Age > 31 years old.  The start date was 01/31/2001.  Her last INR was 2.3.  Anticoagulation responsible provider: Daleen Squibb MD, Maisie Fus.  Exp: 01/2011.    Anticoagulation Management Assessment/Plan:      The patient's current anticoagulation dose is Coumadin 5 mg  tabs: Take as directed by coumadin clinic..  The target INR is 2.0-3.0.  The next INR is due 03/09/2010.  Anticoagulation instructions were given to patient.  Results were reviewed/authorized by Geoffry Paradise, PharmD.         Prior Anticoagulation Instructions: INR 2.3 The patient is to continue with the same dose of coumadin.  This dosage includes:  1 tablet (5mg ) on Mon, Wed and Fri 1/2 tablet (2.5mg ) the rest of the days Recheck INR in 4 weeks  Current Anticoagulation Instructions: INR:  2.5  Your INR is at goal today.  Please continue your Coumadin at 1 tablet Monday, Wednesday, Friday and 1/2 a table on other days of the week.  Please return to clinic in 4 weeks for another INr check.

## 2010-02-26 NOTE — Assessment & Plan Note (Signed)
Summary: nausea/chills/SD   Vital Signs:  Patient profile:   75 year old female Height:      62 inches Weight:      122 pounds BMI:     22.39 O2 Sat:      98 % on Room air Temp:     97.7 degrees F oral Pulse rate:   87 / minute BP sitting:   160 / 88  (left arm) Cuff size:   regular  Vitals Entered By: Bill Salinas CMA (January 09, 2010 11:08 AM)  O2 Flow:  Room air CC: pt here with onset of nausea and trimbling all over that started this morning/ ab   Primary Care Provider:  [facility]  CC:  pt here with onset of nausea and trimbling all over that started this morning/ ab.  History of Present Illness: Mrs. Crocker is seen acutely for agitation and tremors. She reports that this morning after assisting her husband with his morning toilet she found herself having uncontrolable tremors and a feeling of severe anxiety. She identifies no particular triggering event. Her daughter is present and is responsible for bringing her to the doctor. 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 ON questioning Mrs. Jess Barters admits that she feels helpless, hopeless, lonely, sad and tearful, exhausted, sufferes anhedonia, perseveration and is more irritible.   Current Medications (verified): 1)  Coumadin 5 Mg  Tabs (Warfarin Sodium) .... Take As Directed By Coumadin Clinic. 2)  Altace 5 Mg  Tabs (Ramipril) .... Take One Tablet Once Daily 3)  Cardizem Cd 240 Mg  Cp24 (Diltiazem Hcl Coated Beads) .... Take One Tablet Once Daily 4)  Multivitamins   Tabs (Multiple Vitamin) .... Take One Tablet Once Daily 5)  Fish Oil 1000 Mg  Caps (Omega-3 Fatty Acids) .... Take One Capsule Daily 6)  Vitamin B .... Take One Tablet Once Daily 7)  Grape Seed .... Take One Tablet Once Daily 8)  Calcium 500 500 Mg  Tabs (Calcium Carbonate) .... Take One Tablet Once Daily 9)  Vitamin D 1000 Unit   Tabs (Cholecalciferol) .... Take One Tablet Once Daily 10)  Ambien 10 Mg  Tabs (Zolpidem Tartrate) .... Take One Tablet At Bedtime  Allergies (verified): No Known Drug Allergies  Past History:  Past Medical History: Last updated: 06/03/2008 Current Problems:  TRANSIENT ISCHEMIC ATTACKS, HX OF (ICD-V12.50) PAROXYSMAL ATRIAL FIBRILLATION (ICD-427.31) HYPERTENSION (ICD-401.9) GROSS HEMATURIA (ICD-599.71) DYSPHAGIA (ICD-787.20) DIARRHEA (ICD-787.91) POLYPECTOMY, HX OF (ICD-V15.9) Hx of ENDOMETRIOSIS (ICD-617.9) OSTEOPENIA (ICD-733.90) INSOMNIA, CHRONIC (ICD-307.42) TOTAL HYSTERECTOMY AND BILATERAL SALPINGOOPHERECTOMY, HX OF (ICD-V45.77)  Past Surgical History: Last updated: 10/27/2007 POLYPECTOMY, HX OF (ICD-V15.9) TOTAL HYSTERECTOMY AND BILATERAL SALPINGOOPHERECTOMY, HX OF (ICD-V45.77) Appendectomy Skin Cancer Removal  Social History: Last updated: 08/11/2009 HSG; Became a stewardness Married - 1959 1 son - '65; 1 daughter '60; 2 grandchildren 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 End of life Care: no DNR, DNI, no futile or heroic measures. Patient has never smoked.  Alcohol Use - yes -wine Illicit Drug Use - no  Review of Systems Psych:  Complains of anxiety, depression, easily tearful, and irritability; denies alternate hallucination ( auditory/visual), easily angered, mental problems, sense of great danger, suicidal thoughts/plans, and thoughts of violence.  Physical Exam  General:  alert, well-developed, well-nourished, and well-hydrated.   Head:  normocephalic and atraumatic.   Eyes:  pupils equal  and pupils round.   Lungs:  normal respiratory effort.   Heart:  normal rate and regular rhythm.   Neurologic:  alert & oriented X3, cranial nerves II-XII intact, and gait normal.  Involuntary tremulousness. Psych:  Oriented X3, memory intact for recent and remote, normally interactive, good eye contact, and severely anxious.     Impression &  Recommendations:  Problem # 1:  DEPRESSION/ANXIETY (ICD-300.4) Patient with severe anxiety and depression. No suidcidal ideation or thoughts of harm to self or others. Has insight into the difficulties of her situation and does admit that alternative arrangements need to be made in regard to the care of her husband. This is a difficult task to undertaker emotionally and finanacially. Her daughter, present today, along with her son-in-law and son are very supportive.  Plan - acute treatment - Alprazolam 0.5 mg given in the office           Will start sertraline 50mg  by mouth once daily and alprazolam 0.5 mg q4 as needed           f/u office visit in 2-3 weeks. She will call sooner if needed.   Complete Medication List: 1)  Coumadin 5 Mg Tabs (Warfarin sodium) .... Take as directed by coumadin clinic. 2)  Altace 5 Mg Tabs (Ramipril) .... Take one tablet once daily 3)  Cardizem Cd 240 Mg Cp24 (Diltiazem hcl coated beads) .... Take one tablet once daily 4)  Multivitamins Tabs (Multiple vitamin) .... Take one tablet once daily 5)  Fish Oil 1000 Mg Caps (Omega-3 fatty acids) .... Take one capsule daily 6)  Vitamin B  .... Take one tablet once daily 7)  Grape Seed  .... Take one tablet once daily 8)  Calcium 500 500 Mg Tabs (Calcium carbonate) .... Take one tablet once daily 9)  Vitamin D 1000 Unit Tabs (Cholecalciferol) .... Take one tablet once daily 10)  Ambien 10 Mg Tabs (Zolpidem tartrate) .... Take one tablet at bedtime 11)  Sertraline Hcl 50 Mg Tabs (Sertraline hcl) .Marland Kitchen.. 1 by mouth once daily 12)  Alprazolam 0.5 Mg Tabs (Alprazolam) .Marland Kitchen.. 1 by mouth q 4 as needed for anxiety Prescriptions: ALPRAZOLAM 0.5 MG TABS (ALPRAZOLAM) 1 by mouth q 4 as needed for anxiety  #10 x 2   Entered and Authorized by:   Jacques Navy MD   Signed by:   Jacques Navy MD on 01/09/2010   Method used:   Handwritten   RxID:   [id number] SERTRALINE HCL 50 MG TABS (SERTRALINE HCL) 1 by mouth once daily   #30 x 12   Entered and Authorized by:   Jacques Navy MD   Signed by:   Jacques Navy MD on 01/09/2010   Method used:   Electronically to        [facility] [address] [phone]* (retail)       [address]       [place], [place]  [address]       Ph: [phone] or [phone]       Fax: [phone]   RxID:   [id number]    Orders Added: 1)  Est. Patient Level III [75643]

## 2010-03-09 ENCOUNTER — Encounter (INDEPENDENT_AMBULATORY_CARE_PROVIDER_SITE_OTHER): Payer: Medicare Other

## 2010-03-09 ENCOUNTER — Encounter: Payer: Self-pay | Admitting: Cardiology

## 2010-03-09 DIAGNOSIS — I4891 Unspecified atrial fibrillation: Secondary | ICD-10-CM

## 2010-03-09 DIAGNOSIS — Z7901 Long term (current) use of anticoagulants: Secondary | ICD-10-CM

## 2010-03-16 DIAGNOSIS — I4891 Unspecified atrial fibrillation: Secondary | ICD-10-CM

## 2010-03-16 DIAGNOSIS — Z8679 Personal history of other diseases of the circulatory system: Secondary | ICD-10-CM

## 2010-03-16 DIAGNOSIS — G459 Transient cerebral ischemic attack, unspecified: Secondary | ICD-10-CM

## 2010-03-18 NOTE — Medication Information (Signed)
Summary: Coumadin Clinic  Medications Added SERTRALINE HCL 50 MG TABS (SERTRALINE HCL) 1/2 a tablet by mouth once daily       Anticoagulant Therapy  Managed by: Geoffry Paradise, PharmD Referring MD: Sherryl Manges MD PCP: Link Snuffer MD: Jens Som MD, Arlys John Indication 1: Atrial Fibrillation (ICD-427.31) Lab Used: [facility] Site: [facility] INR POC 2.4 INR RANGE 2 - 3  Dietary changes: no    Health status changes: no    Bleeding/hemorrhagic complications: no    Recent/future hospitalizations: no    Any changes in medication regimen? no    Recent/future dental: no  Any missed doses?: no       Is patient compliant with meds? yes       Allergies: No Known Drug Allergies  Anticoagulation Management History:      The patient is taking warfarin and comes in today for a routine follow up visit.  Positive risk factors for bleeding include an age of 73 years or older.  The bleeding index is 'intermediate risk'.  Positive CHADS2 values include History of HTN.  Negative CHADS2 values include Age > 30 years old.  The start date was 01/31/2001.  Her last INR was 2.3.  Anticoagulation responsible provider: Jens Som MD, Arlys John.  INR POC: 2.4.  Cuvette Lot#: [id number].  Exp: 01/2011.    Anticoagulation Management Assessment/Plan:      The patient's current anticoagulation dose is Coumadin 5 mg  tabs: Take as directed by coumadin clinic..  The target INR is 2.0-3.0.  The next INR is due 04/06/2010.  Anticoagulation instructions were given to patient.  Results were reviewed/authorized by Geoffry Paradise, PharmD.         Prior Anticoagulation Instructions: INR:  2.5  Your INR is at goal today.  Please continue your Coumadin at 1 tablet Monday, Wednesday, Friday and 1/2 a table on other days of the week.  Please return to clinic in 4 weeks for another INr check.    Current Anticoagulation Instructions: INR:  2.4 (goal 2-3)  Your INR is at goal today.  Continue taking 1/2 a tablet  everyday except 1 tablet on Monday, Wednesday, and Friday.  Return to clinic in 4 weeks for another INR check.

## 2010-04-06 ENCOUNTER — Encounter: Payer: Self-pay | Admitting: Cardiology

## 2010-04-06 ENCOUNTER — Encounter (INDEPENDENT_AMBULATORY_CARE_PROVIDER_SITE_OTHER): Payer: Medicare Other

## 2010-04-06 DIAGNOSIS — I4891 Unspecified atrial fibrillation: Secondary | ICD-10-CM

## 2010-04-06 DIAGNOSIS — Z7901 Long term (current) use of anticoagulants: Secondary | ICD-10-CM

## 2010-04-14 NOTE — Medication Information (Signed)
Summary: ROV/TP  Anticoagulant Therapy  Managed by: Bethena Midget, RN, BSN Referring MD: Sherryl Manges MD PCP: Link Snuffer MD: Riley Kill MD, Maisie Fus Indication 1: Atrial Fibrillation (ICD-427.31) Lab Used: [facility] Site: [facility] INR POC 2.6 INR RANGE 2 - 3  Dietary changes: no    Health status changes: no    Bleeding/hemorrhagic complications: no    Recent/future hospitalizations: no    Any changes in medication regimen? no    Recent/future dental: no  Any missed doses?: no       Is patient compliant with meds? yes       Allergies: No Known Drug Allergies  Anticoagulation Management History:      The patient is taking warfarin and comes in today for a routine follow up visit.  Positive risk factors for bleeding include an age of 73 years or older.  The bleeding index is 'intermediate risk'.  Positive CHADS2 values include History of HTN.  Negative CHADS2 values include Age > 73 years old.  The start date was 01/31/2001.  Her last INR was 2.3.  Anticoagulation responsible provider: Riley Kill MD, Maisie Fus.  INR POC: 2.6.  Cuvette Lot#: [id number].  Exp: 03/2011.    Anticoagulation Management Assessment/Plan:      The patient's current anticoagulation dose is Coumadin 5 mg  tabs: Take as directed by coumadin clinic..  The target INR is 2.0-3.0.  The next INR is due 05/04/2010.  Anticoagulation instructions were given to patient.  Results were reviewed/authorized by Bethena Midget, RN, BSN.  She was notified by Bethena Midget, RN, BSN.         Prior Anticoagulation Instructions: INR:  2.4 (goal 2-3)  Your INR is at goal today.  Continue taking 1/2 a tablet everyday except 1 tablet on Monday, Wednesday, and Friday.  Return to clinic in 4 weeks for another INR check.    Current Anticoagulation Instructions: INR 2.6 Continue 2.5mg  everyday except 5mg s on Mondays, Wednesdays and Fridays. Recheck in 4 weeks.

## 2010-05-04 ENCOUNTER — Encounter: Payer: Medicare Other | Admitting: *Deleted

## 2010-05-05 ENCOUNTER — Ambulatory Visit (INDEPENDENT_AMBULATORY_CARE_PROVIDER_SITE_OTHER): Payer: Medicare Other | Admitting: *Deleted

## 2010-05-05 DIAGNOSIS — Z7901 Long term (current) use of anticoagulants: Secondary | ICD-10-CM

## 2010-05-05 DIAGNOSIS — I4891 Unspecified atrial fibrillation: Secondary | ICD-10-CM

## 2010-05-05 DIAGNOSIS — G459 Transient cerebral ischemic attack, unspecified: Secondary | ICD-10-CM

## 2010-05-05 DIAGNOSIS — Z8679 Personal history of other diseases of the circulatory system: Secondary | ICD-10-CM

## 2010-05-05 LAB — POCT INR: INR: 2.8

## 2010-05-11 ENCOUNTER — Ambulatory Visit (INDEPENDENT_AMBULATORY_CARE_PROVIDER_SITE_OTHER): Payer: Medicare Other | Admitting: Internal Medicine

## 2010-05-11 DIAGNOSIS — H9209 Otalgia, unspecified ear: Secondary | ICD-10-CM

## 2010-05-11 DIAGNOSIS — F341 Dysthymic disorder: Secondary | ICD-10-CM

## 2010-05-11 DIAGNOSIS — H9202 Otalgia, left ear: Secondary | ICD-10-CM

## 2010-05-11 DIAGNOSIS — R252 Cramp and spasm: Secondary | ICD-10-CM

## 2010-05-11 NOTE — Progress Notes (Signed)
  Subjective:    Patient ID: Selena Taylor, female    DOB: [birth year], 75 y.o.   MRN: [id number]  HPI Presents acutely for a problem with a sore throat and pain in the area of the left ear. She has had no fever or chills, hearing loss, drainage or swallowing problems. The pain is much better at this time.   She is having nocturnal leg cramps  Anxiety - she was unable to tolerate Zoloft due to [place].   PMH, FamHx and SocHx reviewed  In centricity for any changes and relevance.   Review of Systems  Constitutional: Negative.   HENT:       Mild left earache- resolved. Mild sore throat, more left, resolved.  Eyes: Negative.   Respiratory: Negative.   Cardiovascular: Negative.   Genitourinary: Negative.   Musculoskeletal:       Leg cramping  Neurological: Negative.   Psychiatric/Behavioral: Negative for dysphoric mood and decreased concentration.       Objective:   Physical Exam WNWD white woman in no distress HEENT - EACs/TMs normal, throat clear, minimal tenderness over left TMJ, no crepitus. Lungs- CTAP Cor- RRR Neuro- nonfocal Psych - alert, oriented, calm       Assessment & Plan:  1. Leg cramps - mild symptoms  Plan - quinine via tonic water; consider Ginko, stretches  2. Earache- no sign of infection. Suspect possible mild TMJ  Plan - small bites, no gum, etc.  3. Anxiety - intolerant of SSRI due to [place]. Doing well by her report at this time. No medications indicated.

## 2010-06-01 ENCOUNTER — Ambulatory Visit (INDEPENDENT_AMBULATORY_CARE_PROVIDER_SITE_OTHER): Payer: Medicare Other | Admitting: *Deleted

## 2010-06-01 DIAGNOSIS — I4891 Unspecified atrial fibrillation: Secondary | ICD-10-CM

## 2010-06-01 DIAGNOSIS — G459 Transient cerebral ischemic attack, unspecified: Secondary | ICD-10-CM

## 2010-06-01 DIAGNOSIS — Z8679 Personal history of other diseases of the circulatory system: Secondary | ICD-10-CM

## 2010-06-03 ENCOUNTER — Other Ambulatory Visit: Payer: Self-pay | Admitting: Internal Medicine

## 2010-06-03 DIAGNOSIS — Z1231 Encounter for screening mammogram for malignant neoplasm of breast: Secondary | ICD-10-CM

## 2010-06-05 ENCOUNTER — Encounter: Payer: Self-pay | Admitting: Internal Medicine

## 2010-06-05 ENCOUNTER — Encounter: Payer: Self-pay | Admitting: *Deleted

## 2010-06-08 ENCOUNTER — Encounter: Payer: Self-pay | Admitting: Internal Medicine

## 2010-06-08 ENCOUNTER — Ambulatory Visit (INDEPENDENT_AMBULATORY_CARE_PROVIDER_SITE_OTHER): Payer: Medicare Other | Admitting: Internal Medicine

## 2010-06-08 VITALS — BP 110/64 | HR 110 | Ht 62.0 in | Wt 122.0 lb

## 2010-06-08 DIAGNOSIS — I4891 Unspecified atrial fibrillation: Secondary | ICD-10-CM

## 2010-06-08 MED ORDER — DILTIAZEM HCL ER COATED BEADS 240 MG PO CP24: 240.0000 mg | ORAL_CAPSULE | Freq: Every day | ORAL | Status: DC

## 2010-06-08 MED ORDER — RAMIPRIL 5 MG PO TABS: 5.0000 mg | ORAL_TABLET | Freq: Every day | ORAL | Status: DC

## 2010-06-08 MED ORDER — DIGOXIN 125 MCG PO TABS: 125.0000 ug | ORAL_TABLET | Freq: Every day | ORAL | Status: DC

## 2010-06-08 MED ORDER — WARFARIN SODIUM 5 MG PO TABS: 5.0000 mg | ORAL_TABLET | ORAL | Status: DC

## 2010-06-08 NOTE — Assessment & Plan Note (Signed)
She is worse and have suggested to her that she work with Dr Debby Bud to find an alternative solution to currnet one

## 2010-06-08 NOTE — Patient Instructions (Addendum)
Your physician has recommended you make the following change in your medication: Start Digoxin (lanoxin) 0.125mg  one tablet once daily.  Your physician wants you to follow-up in: 6 months. You will receive a reminder letter in the mail two months in advance. If you don't receive a letter, please call our office to schedule the follow-up appointment.

## 2010-06-08 NOTE — Assessment & Plan Note (Signed)
History of atrial fibrillation we discussed cardioversion and rate control. Her blood pressure is 110 limiting our of titration. She is not interested in beta blockers because of the potential for psychomotor retardation given her overall sense of ill being. We will put her on Lanoxin 0.125. In the event that her symptoms persist over the next couple of days she will cause and will undertake cardioversion.

## 2010-06-08 NOTE — Progress Notes (Signed)
  HPI  Selena Taylor is a 76 y.o. female  seen in followup for paroxysmal atrial fibrillation which is largely quiet.   They have recently moved to [facility] loft.He is in respite care at [facility].  she is adjusting to her husband's dementia and comes in   much more relaxed.  she  has no problems with chest pain shortness of breath or peripheral edema; she has had palpitations in the last 24 hours.  The situation with her husband has become progressively more difficult. She is feeling overwhelmed depressed and fatigued  Past Medical History  Diagnosis Date  . TIA (transient ischemic attack)   . PAF (paroxysmal atrial fibrillation)   . HTN (hypertension)   . Gross hematuria   . Dysphagia   . Endometriosis   . Osteopenia   . Insomnia     Past Surgical History  Procedure Date  . Polypectomy   . Total hysterectomy and bilateral salpingoopherectomy   . Appendectomy   . Skin cancer removal     Current Outpatient Prescriptions  Medication Sig Dispense Refill  . B Complex-C (B-COMPLEX WITH VITAMIN C) tablet Take 1 tablet by mouth daily.        . Calcium Carbonate-Vitamin D (CALCIUM 500 + D PO) Take by mouth.        . Cholecalciferol (VITAMIN D) 1000 UNITS capsule Take 1,000 Units by mouth daily.        Marland Kitchen diltiazem (CARDIZEM CD) 240 MG 24 hr capsule Take 240 mg by mouth daily.        . Grape Seed 100 MG CAPS Take by mouth.        . Omega-3 Fatty Acids (FISH OIL) 1000 MG CAPS Take by mouth.        . ramipril (ALTACE) 5 MG tablet Take 5 mg by mouth daily.        Marland Kitchen warfarin (COUMADIN) 5 MG tablet Take by mouth as directed.        . zolpidem (AMBIEN) 10 MG tablet Take 10 mg by mouth at bedtime as needed.        Marland Kitchen DISCONTD: MULTIPLE VITAMINS PO Take by mouth.          Allergies  Allergen Reactions  . Novocain     Review of Systems negative except from HPI and PMH  Physical Exam Well developed and well nourished but sad and worn out in no acute distress HENT normal E scleral  and icterus clear Neck Supple JVP flat; carotids brisk and full Clear to ausculation IrRegular rate and rhythmIr Soft with active bowel sounds No clubbing cyanosis and edema Alert and oriented, grossly normal motor and sensory function Skin Warm and Dry  ECG fibrillation at 110 next item multi-site but only x0.23 Axis is 74 Nonspecific T wave Assessment and  Plan

## 2010-06-09 NOTE — Assessment & Plan Note (Signed)
[name]                         ELECTROPHYSIOLOGY OFFICE NOTE   Selena Taylor                         MRN:          [id number]  DATE:06/15/2007                            DOB:          1935/07/10    Selena Taylor comes in in followup for atrial fibrillation.  In the context  of hypertension, she is doing quite well without significant symptomatic  palpitations.  She is undergoing a great deal of stress though.  She is  selling her house, her husband has progressive dementia, and is quite  clingy.   MEDICATIONS INCLUDE:  1. Altace 5.  2. Cardizem 240.  3. Coumadin.  4. Ambien.   PHYSICAL EXAMINATION:  Her blood pressure is 135/69.  Her pulse is 72.  Her weight was 116 down from 130 a year ago.  LUNGS:  Clear.  HEART:  Sounds were regular.  EXTREMITIES:  Without edema.   ELECTROCARDIOGRAM:  Demonstrates sinus rhythm.   IMPRESSION:  1. Paroxysmal atrial fibrillation.  2. Hypertension.  3. Significant psychosocial stress.   We will plan to continue her current medications and I have refilled  them.  We will see her, again, in 1 year's time.     Duke Salvia, MD, [facility]  Electronically Signed    SCK/MedQ  DD: 06/15/2007  DT: 06/15/2007  Job #: [id number]

## 2010-06-09 NOTE — Assessment & Plan Note (Signed)
[facility]                         ELECTROPHYSIOLOGY OFFICE NOTE   KAZUE, CERRO                         MRN:          [id number]  DATE:06/16/2006                            DOB:          1935-04-16    Selena Taylor comes in and she has paroxysmal atrial fibrillation and  hypertension and she is doing really pretty well.  Exercise tolerance is  good.  She just won the [employer] for free-throw shooting in her age  bracket.  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   Her INR was a little bit subtherapeutic today.  She was worrying about  that.  There is no antibiotics and no changes in her diet.  There is  exposure to vitamin D and I will need to explore this.   PHYSICAL EXAMINATION:  VITAL SIGNS:  Blood pressure 124/76, pulse 70.  LUNGS:  Clear.  HEART:  Sounds were regular.   Electrocardiogram today demonstrated sinus rhythm at 65 with intervals  of 0.14/0.07/0.41 with a burst of nonsustained atrial couplets at the  end of the strip.   IMPRESSION:  1. Paroxysmal atrial fibrillation, atrial ectopy.  2. Hypertension.  3. Coumadin.   Selena Taylor is stable and I will plan to see her again in a year.     Duke Salvia, MD, [facility]  Electronically Signed    SCK/MedQ  DD: 06/16/2006  DT: 06/16/2006  Job #: [id number]

## 2010-06-12 NOTE — Assessment & Plan Note (Signed)
[facility]                         ELECTROPHYSIOLOGY OFFICE NOTE   ARTHI, MCDONALD                         MRN:          [id number]  DATE:01/06/2006                            DOB:          27-Apr-1935    Ms. Pensyl comes in today. She is doing pretty well. She has had some  atrial fibrillation. When we increased her Cardizem from 120 to 240 she  actually did much much better. She feels like she may be breaking  through a little bit now at this point. She has had one series of a  couple of bad days but otherwise has been doing quite well. She also  continues to complain of not being able to sleep.   MEDICATIONS:  1. Cardizem 240.  2. Altace 5.  3. Coumadin.  4. Ambien.  5. Prilosec.  6. Other nutraceuticals.   PHYSICAL EXAMINATION:  VITAL SIGNS:  Her blood pressure was 124/76, her  pulse was 70.  LUNGS:  Clear.  HEART:  Sounds were regular.   Electrocardiogram  demonstrated sinus rhythm at 70 with intervals of  0.15/0.08/0.4. The axis was 60 degrees.   IMPRESSION:  1. Paroxysmal atrial fibrillation.  2. Hypertension.  3. Coumadin.  4. Sleep disturbance.   Ms. Bally is stable. I suggested that she discuss with Dr. Debby Bud the  possibility of a sleep referral for her sleep hygiene and her  pharmacotherapy.   We will see her again in 1 year's time.   I have refilled her prescriptions today for Cardizem 240 and Altace 5  and I have told her that she can take an extra Cardizem as needed for  bad days.     Duke Salvia, MD, [facility]  Electronically Signed    SCK/MedQ  DD: 01/06/2006  DT: 01/06/2006  Job #: [id number]

## 2010-06-13 ENCOUNTER — Observation Stay (HOSPITAL_COMMUNITY)
Admission: EM | Admit: 2010-06-13 | Discharge: 2010-06-15 | Disposition: A | Discharge status: Home / Self Care | Payer: Medicare Other | Attending: Cardiology | Admitting: Cardiology

## 2010-06-13 ENCOUNTER — Emergency Department (HOSPITAL_COMMUNITY): Payer: Medicare Other

## 2010-06-13 DIAGNOSIS — F341 Dysthymic disorder: Secondary | ICD-10-CM | POA: Insufficient documentation

## 2010-06-13 DIAGNOSIS — Z7901 Long term (current) use of anticoagulants: Secondary | ICD-10-CM | POA: Insufficient documentation

## 2010-06-13 DIAGNOSIS — R079 Chest pain, unspecified: Secondary | ICD-10-CM | POA: Insufficient documentation

## 2010-06-13 DIAGNOSIS — M948X9 Other specified disorders of cartilage, unspecified sites: Secondary | ICD-10-CM | POA: Insufficient documentation

## 2010-06-13 DIAGNOSIS — I4891 Unspecified atrial fibrillation: Secondary | ICD-10-CM

## 2010-06-13 DIAGNOSIS — Z8673 Personal history of transient ischemic attack (TIA), and cerebral infarction without residual deficits: Secondary | ICD-10-CM | POA: Insufficient documentation

## 2010-06-13 DIAGNOSIS — R5381 Other malaise: Secondary | ICD-10-CM | POA: Insufficient documentation

## 2010-06-13 DIAGNOSIS — I10 Essential (primary) hypertension: Secondary | ICD-10-CM | POA: Insufficient documentation

## 2010-06-13 DIAGNOSIS — I2510 Atherosclerotic heart disease of native coronary artery without angina pectoris: Secondary | ICD-10-CM | POA: Insufficient documentation

## 2010-06-13 LAB — COMPREHENSIVE METABOLIC PANEL
AST: 21 U/L (ref 0–37)
Albumin: 3.7 g/dL (ref 3.5–5.2)
Chloride: 102 mEq/L (ref 96–112)
Creatinine, Ser: 0.58 mg/dL (ref 0.4–1.2)
GFR calc Af Amer: >60 mL/min (ref >60)
Potassium: 3.6 mEq/L (ref 3.5–5.1)
Total Bilirubin: 0.2 mg/dL — ABNORMAL LOW (ref 0.3–1.2)

## 2010-06-13 LAB — CBC
HCT: 42.3 % (ref 36.0–46.0)
MCH: 33 pg (ref 26.0–34.0)
MCHC: 34 g/dL (ref 30.0–36.0)
MCV: 97 fL (ref 78.0–100.0)
RDW: 12.6 % (ref 11.5–15.5)

## 2010-06-13 LAB — PROTIME-INR: INR: 2.14 — ABNORMAL HIGH (ref 0.00–1.49)

## 2010-06-13 LAB — TSH: TSH: 0.865 u[IU]/mL (ref 0.350–4.500)

## 2010-06-13 LAB — CK TOTAL AND CKMB (NOT AT ARMC): CK, MB: 2.8 ng/mL (ref 0.3–4.0)

## 2010-06-13 LAB — POCT CARDIAC MARKERS: CKMB, poc: 1.5 ng/mL (ref 1.0–8.0)

## 2010-06-13 IMAGING — CR DG CHEST 1V PORT
1 series · 1 of 1 positions shown · non-contrast
Comparison: None.

CLINICAL DATA: Chest pain, tachycardia

PORTABLE CHEST - 1 VIEW

[AP]
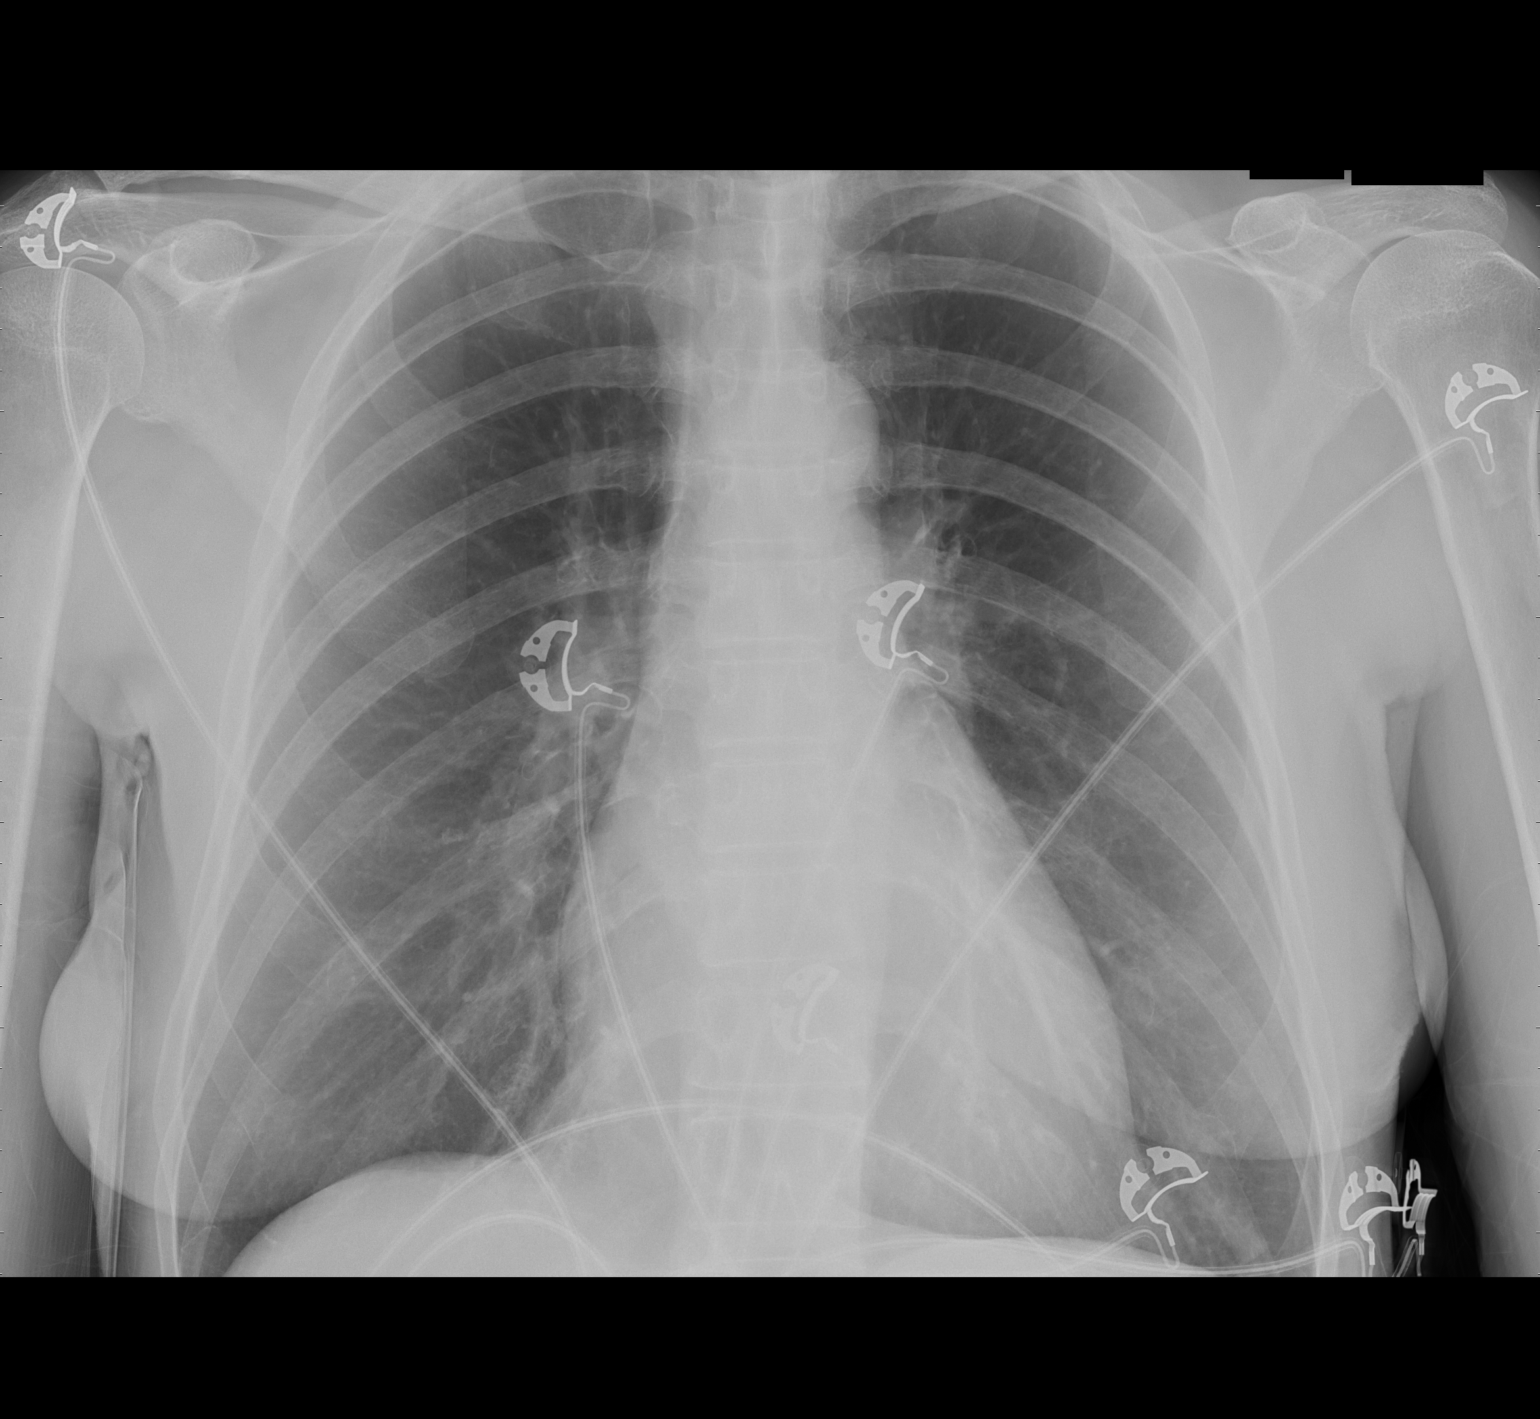

[1 of 1 positions shown; findings below may reference images not displayed]

FINDINGS: Heart size within normal limits.  Negative for heart
failure.  Lungs are clear without pneumonia or effusion
IMPRESSION: Negative

## 2010-06-14 LAB — CARDIAC PANEL(CRET KIN+CKTOT+MB+TROPI)
CK, MB: 2.4 ng/mL (ref 0.3–4.0)
CK, MB: 2.6 ng/mL (ref 0.3–4.0)
Relative Index: INVALID (ref 0.0–2.5)
Total CK: 92 U/L (ref 7–177)
Troponin I: <0.3 ng/mL (ref <0.30)

## 2010-06-15 ENCOUNTER — Encounter: Payer: Self-pay | Admitting: Internal Medicine

## 2010-06-15 ENCOUNTER — Telehealth: Payer: Self-pay

## 2010-06-15 LAB — PROTIME-INR
INR: 1.92 — ABNORMAL HIGH (ref 0.00–1.49)
Prothrombin Time: 22.1 seconds — ABNORMAL HIGH (ref 11.6–15.2)

## 2010-06-15 NOTE — Telephone Encounter (Signed)
[facility] Triage Call Report Triage Record Num: [id number] Operator: Geanie Berlin Patient Name: Selena Taylor Call Date & Time: 06/13/2010 12:20:45PM Patient Phone: [phone] PCP: Illene Regulus Patient Gender: Female PCP Fax : [phone] Patient DOB: 07-14-1935 Practice Name: [facility] Reason for Call: Emergent call: Called re "chest tightness" L breast area intermittently since 06/11/10. Afebrile. Pulse 144 and BP 168/109. Advised to call 911 now for chest pain for > 5 min per Chest Pain Guideline. Protocol(s) Used: Chest Pain Recommended Outcome per Protocol: Activate EMS 911 Reason for Outcome: Chest discomfort associated with shortness of breath, sweating, nausea, vomiting, lightheadedness, or fainting lasting 5 or more minutes now or within the last hour Care Advice: ~ An adult should stay with the patient, preferably one trained in CPR. ~ IMMEDIATE ACTION Write down provider's name. List or place the following in a bag for transport with the patient: current prescription and/or nonprescription medications; alternative treatments, therapies and medications; and street drugs. ~ ~ Place person in a position of comfort and loosen tight clothing. 06/13/2010 12:25:42PM Page 1 of 1 [email or web address]

## 2010-06-16 NOTE — Discharge Summary (Signed)
NAMEANTONYA, Taylor                  ACCOUNT NO.:  [id number]  MEDICAL RECORD NO.:  [id number]           PATIENT TYPE:  O  LOCATION:  3741                         FACILITY:  [facility]  PHYSICIAN:  Camp Gopal C. Sharona Rovner, MD, FACCDATE OF BIRTH:  [birth year]  DATE OF ADMISSION:  06/13/2010 DATE OF DISCHARGE:  06/14/2010                              DISCHARGE SUMMARY   PRIMARY CARDIOLOGIST:  Duke Salvia, MD, [facility].  PRIMARY CARE PHYSICIAN:  Rosalyn Gess. Norins, MD.  REASON FOR ADMISSION:  Atrial fibrillation with rapid ventricular rate.  DISCHARGE DIAGNOSES: 1. Paroxysmal atrial fibrillation.     a.     Converted to normal sinus rhythm prior to discharge. 2. Chronic Coumadin therapy. 3. History of transient ischemic attacks. 4. Hypertension. 5. Depression/anxiety.  ADMISSION HISTORY:  [name] is a 75 year old female patient with a history of paroxysmal atrial fibrillation on chronic Coumadin therapy. He was seen by Dr. Graciela Husbands in the office on Jun 08, 2010.  She was back in AFib at that time, but deferred proceeding with cardioversion.  She then presented to the emergency room on the day of admission with increasing palpitations and chest tightness.  She converted to normal sinus rhythm in the emergency room.  She was kept overnight for observation given the inferolateral ST-segment depression while she had a rapid rate as well as her chest tightness.  HOSPITAL COURSE:  The patient remained in sinus rhythm throughout her admission.  Cardiac enzymes were negative x3, ruling her out for myocardial infarction.  She had had some bradycardia in the middle of the night and her diltiazem drip was stopped.  She was evaluated by Dr. Daleen Squibb on the morning of discharge and felt to be stable enough for discharge to home.  Given her EKG changes and symptoms, we plan on setting her up for an outpatient stress Myoview study.  If this is negative for ischemic heart disease, she can certainly be considered  for class IC antiarrhythmic.  She can follow up with Dr. Graciela Husbands to discuss this further.  LABORATORY AND ANCILLARY DATA:  Hemoglobin 14.4.  INR 2.14.  Potassium 3.6, glucose 131, creatinine 0.58.  Cardiac markers negative x3.  TSH 0.865.  Magnesium 1.8.  Chest x-ray negative.  ALLERGIES:  NOVOCAINE.  DISCHARGE MEDICATIONS: 1. Ambien 10 mg at bedtime p.r.n. 2. Calcium citrate 2 tablets daily. 3. Coumadin 5 mg Monday, Wednesday, Friday and 2.5 mg all other days. 4. Digoxin 0.125 mg daily. 5. Diltiazem CD 240 mg daily. 6. Grapeseed extract 1000 mg q.p.m. 7. Ramipril 5 mg daily.  ACTIVITY:  Increase activity slowly.  DIET:  Low-fat, low-sodium diet.  WOUND CARE:  Not applicable.  FOLLOWUP: 1. She will be set up for an outpatient stress Myoview study in the     next 1-2 weeks. 2. She will be set up for a followup with Dr. Graciela Husbands in the next 2-4     weeks to review the findings on her stress test and to discuss     whether or not she should be placed on a class IC antiarrhythmic.  Total physician and PA time greater  than 30 minutes since discharge.     Tereso Newcomer, PA-C   ______________________________ Jesse Sans Daleen Squibb, MD, [facility]    SW/MEDQ  D:  06/14/2010  T:  06/14/2010  Job:  [id number]  cc:   Duke Salvia, MD, [facility] Rosalyn Gess. Norins, MD  Electronically Signed by Tereso Newcomer PA-C on 06/14/2010 04:12:34 PM Electronically Signed by Valera Castle MD [facility] on 06/16/2010 08:00:36 AM

## 2010-06-23 ENCOUNTER — Encounter: Payer: Medicare Other | Admitting: *Deleted

## 2010-06-24 ENCOUNTER — Other Ambulatory Visit (HOSPITAL_COMMUNITY): Payer: Medicare Other | Admitting: Radiology

## 2010-06-24 ENCOUNTER — Encounter: Payer: Medicare Other | Admitting: *Deleted

## 2010-06-24 ENCOUNTER — Encounter (HOSPITAL_COMMUNITY): Payer: Medicare Other | Admitting: Radiology

## 2010-07-01 NOTE — Discharge Summary (Signed)
NAMESCARLETT, Selena Taylor                  ACCOUNT NO.:  [id number]  MEDICAL RECORD NO.:  [id number]           PATIENT TYPE:  O  LOCATION:  3741                         FACILITY:  [facility]  PHYSICIAN:  Selena Salvia, MD, FACCDATE OF BIRTH:  06/29/1935  DATE OF ADMISSION:  06/13/2010 DATE OF DISCHARGE:  06/15/2010                              DISCHARGE SUMMARY   PRIMARY CARDIOLOGIST:  Selena Salvia, MD, [facility]  PRIMARY CARE PROVIDER:  Rosalyn Gess. Norins, MD  DISCHARGE DIAGNOSIS:  Paroxysmal atrial fibrillation.  SECONDARY DIAGNOSES: 1. Hypertension. 2. History of transient ischemic attack. 3. Depression, anxiety. 4. Osteopenia. 5. History of endometriosis. 6. History of gross hematuria. 7. Status post abdominal hysterectomy and bilateral salpingo-     oophorectomy. 8. Status post appendectomy. 9. Status post melanoma excision. 10.Status post polypectomy.  ALLERGIES:  NOVOCAINE.  PROCEDURES:  None.  HISTORY OF PRESENT ILLNESS:  A 75 year old female with prior history of paroxysmal atrial fibrillation on chronic Coumadin therapy who was recently seen by Dr. Graciela Taylor in the office on Jun 08, 2010, was noted to be back in atrial fibrillation with rate 110.  Cardioversion was off at that time but she deferred.  She was subsequently placed on digoxin in addition to her long-term diltiazem therapy but unfortunately, the patient experienced increasing tachy palpitations.  On Jun 13, 2010, she found her heart rate to be 114 at home.  She presented to the [facility] emergency department.  While there, she was placed on IV diltiazem infusion at 10 mg an hour and subsequently converted to sinus rhythm. Of note, while she was tachycardic, her ECG showed inferolateral ST- segment depression of approximately 1-2 mm.  Decision was made to observe the patient overnight.  HOSPITAL COURSE:  Initially, the patient did well overnight with plans for discharge on Jun 14, 2010.  However, prior to  discharge, the patient had recurrent atrial fibrillation with rates in the 90s.  After discussion with Electrophysiology, a decision was made to initiate flecainide therapy at 50 mg b.i.d.  With initiation of flecainide, the patient has had no further atrial fibrillation.  We will discharge her home today in good condition.  As she did have inferolateral ST-segment depression on admission, we have arranged for an outpatient exercise Myoview next week.  DISCHARGE LABORATORY DATA:  Hemoglobin 14.4, hematocrit 42.3, WBC 7.5, platelets 227, INR 1.92.  Sodium 138, potassium 3.6, chloride 102, CO2 24, BUN 16, creatinine 0.58, glucose 131.  Total bilirubin 0.2, alkaline phosphatase 61, AST 21, ALT 14, total protein 6.9, albumin 3.7, calcium 9.0, magnesium 1.8, CK 92, MB 2.6, troponin-I less than 0.30.  TSH 0.865.  DISPOSITION:  The patient will be discharged home today in good condition.  FOLLOWUP PLANS AND APPOINTMENTS:  The patient will undergo exercise Myoview next week in our office on Jun 22, 2010, at 9:45 a.m.  She will see [facility] at 2 p.m. on the same day.  She will follow up with Dr. Graciela Taylor in July 27, 2010, at 10:15 a.m.  DISCHARGE MEDICATIONS: 1. Flecainide 50 mg b.i.d. 2. Ambien 10 mg at bedtime. 3.  Citracal 2 tablets daily. 4. Coumadin 5 mg 1 tablet Monday, Wednesday, Friday, 1/2 tablet all     other days. 5. Diltiazem CD 240 mg daily. 6. Grapeseed extract 1000 mg daily. 7. Enalapril 5 mg daily. 8. We have discontinued digoxin.  OUTSTANDING LABORATORY STUDIES:  Followup Myoview and INR as outlined above.  DURATION OF DISCHARGE ENCOUNTER:  45 minutes including physician time.     Selena Taylor, ANP   ______________________________ Selena Salvia, MD, [facility]    CB/MEDQ  D:  06/15/2010  T:  06/15/2010  Job:  [id number]  cc:   Selena Gess. Norins, MD  Electronically Signed by Selena Taylor ANP on 06/18/2010 05:10:33 PM Electronically  Signed by Selena Manges MD [facility] on 07/01/2010 07:35:41 AM

## 2010-07-02 ENCOUNTER — Ambulatory Visit (HOSPITAL_COMMUNITY): Payer: Medicare Other | Attending: Internal Medicine | Admitting: Radiology

## 2010-07-02 ENCOUNTER — Ambulatory Visit (INDEPENDENT_AMBULATORY_CARE_PROVIDER_SITE_OTHER): Payer: Medicare Other | Admitting: *Deleted

## 2010-07-02 VITALS — Ht 62.0 in | Wt 121.0 lb

## 2010-07-02 DIAGNOSIS — R0789 Other chest pain: Secondary | ICD-10-CM

## 2010-07-02 DIAGNOSIS — I4891 Unspecified atrial fibrillation: Secondary | ICD-10-CM

## 2010-07-02 DIAGNOSIS — Z8679 Personal history of other diseases of the circulatory system: Secondary | ICD-10-CM

## 2010-07-02 DIAGNOSIS — R079 Chest pain, unspecified: Secondary | ICD-10-CM | POA: Insufficient documentation

## 2010-07-02 DIAGNOSIS — G459 Transient cerebral ischemic attack, unspecified: Secondary | ICD-10-CM

## 2010-07-02 MED ORDER — TECHNETIUM TC 99M TETROFOSMIN IV KIT
11.0000 | PACK | Freq: Once | INTRAVENOUS | Status: AC | PRN
  Administered 2010-07-02: 11 via INTRAVENOUS

## 2010-07-02 MED ORDER — TECHNETIUM TC 99M TETROFOSMIN IV KIT
33.0000 | PACK | Freq: Once | INTRAVENOUS | Status: AC | PRN
  Administered 2010-07-02: 33 via INTRAVENOUS

## 2010-07-02 NOTE — Progress Notes (Signed)
[facility] SITE 3 NUCLEAR MED [address] [place] [address] [phone]  Cardiology Nuclear Med Study  Selena Taylor is a 75 y.o. female [id number] 03/11/1935   Nuclear Med Background Indication for Stress Test:  Evaluation for Ischemia and Post Hospital on 06/15/10 with PAF, CP and            abnormal EKG, (-) enzymes; Flecainide was started 2-weeks ago, need to                                             assess for ventricular arrhythmia History:  '03 BMW:[id number], EF>70%, '04 Echo:normal LVF, h/o PAF Cardiac Risk Factors: Family History - CAD, Hypertension and TIA  Symptoms:  Chest Tightness (none since discharge), Dizziness, Fatigue, Palpitations and Rapid HR   Nuclear Pre-Procedure Caffeine/Decaff Intake:  None NPO After: 5:00pm   Lungs:  Clear. IV 0.9% NS with Angio Cath:  22g  IV Site: R Hand  IV Started by:  Irean Hong, RN  Chest Size (in):  33 Cup Size: A  Height: 5\' 2"  (1.575 m)  Weight:  121 lb (54.885 kg)  BMI:  Body mass index is 22.13 kg/(m^2). Tech Comments:  N/A    Nuclear Med Study 1 or 2 day study: 1 day  Stress Test Type:  Stress  Reading MD: Cassell Clement, MD  Order Authorizing Provider:  Sherryl Manges, MD  Resting Radionuclide: Technetium 46m Tetrofosmin  Resting Radionuclide Dose: 11 mCi   Stress Radionuclide:  Technetium 67m Tetrofosmin  Stress Radionuclide Dose: 33 mCi           Stress Protocol Rest HR: 60 Stress HR: 130  Rest BP: 137/76 Stress BP: 203/77  Exercise Time (min): 9:45 METS: 10.4   Predicted Max HR: 148 bpm % Max HR: 87.84 bpm Rate Pressure Product: 02725   Dose of Adenosine (mg):  n/a Dose of Lexiscan: n/a mg  Dose of Atropine (mg): n/a Dose of Dobutamine: n/a mcg/kg/min (at max HR)  Stress Test Technologist: Smiley Houseman, CMA-N  Nuclear Technologist:  Domenic Polite, CNMT     Rest Procedure:  Myocardial perfusion imaging was performed at rest 45 minutes following the intravenous administration  of Technetium 35m Tetrofosmin.  Rest ECG: No acute changes.  Stress Procedure:  The patient exercised for 9:45 on the treadmill utilizing the Bruce protocol.  The patient stopped due to fatigue and denied any chest pain.  There were no diagnostic ST-T wave changes.  She did have a mild hypertensive response, 203/77, at peak exercise.  Technetium 7m Tetrofosmin was injected at peak exercise and myocardial perfusion imaging was performed after a brief delay. Stress ECG: No significant change from baseline ECG  QPS Raw Data Images:  Normal; no motion artifact; normal heart/lung ratio. Stress Images:  Normal homogeneous uptake in all areas of the myocardium. Rest Images:  Normal homogeneous uptake in all areas of the myocardium. Subtraction (SDS):  No evidence of ischemia. Transient Ischemic Dilatation (Normal <1.22):  1.07 Lung/Heart Ratio (Normal <0.45):  .27  Quantitative Gated Spect Images QGS EDV:  63 ml QGS ESV:  16 ml QGS cine images:  NL LV Function; NL Wall Motion QGS EF: 74%  Impression Exercise Capacity:  Good exercise capacity. BP Response:  Hypertensive blood pressure response. Clinical Symptoms:  No chest pain. ECG Impression:  No significant ST segment change suggestive of  ischemia. Comparison with Prior Nuclear Study: No significant change from previous study  Overall Impression:  Normal stress nuclear study.     Cassell Clement

## 2010-07-03 NOTE — Progress Notes (Signed)
COPY ROUTED TO DR. KLEIN.Falecha L Clark ° °

## 2010-07-05 NOTE — Progress Notes (Signed)
Please Inform Patient  Thanks  

## 2010-07-08 ENCOUNTER — Ambulatory Visit: Payer: Medicare Other

## 2010-07-13 ENCOUNTER — Other Ambulatory Visit: Payer: Self-pay | Admitting: Internal Medicine

## 2010-07-13 MED ORDER — ZOLPIDEM TARTRATE 10 MG PO TABS: 10.0000 mg | ORAL_TABLET | Freq: Every evening | ORAL | Status: DC | PRN

## 2010-07-14 ENCOUNTER — Ambulatory Visit: Payer: Medicare Other

## 2010-07-15 NOTE — Progress Notes (Signed)
The patient is aware of her results.  

## 2010-07-27 ENCOUNTER — Ambulatory Visit (INDEPENDENT_AMBULATORY_CARE_PROVIDER_SITE_OTHER): Payer: Medicare Other | Admitting: Internal Medicine

## 2010-07-27 ENCOUNTER — Encounter: Payer: Medicare Other | Admitting: *Deleted

## 2010-07-27 ENCOUNTER — Ambulatory Visit (INDEPENDENT_AMBULATORY_CARE_PROVIDER_SITE_OTHER): Payer: Medicare Other | Admitting: *Deleted

## 2010-07-27 DIAGNOSIS — I4891 Unspecified atrial fibrillation: Secondary | ICD-10-CM

## 2010-07-27 DIAGNOSIS — G459 Transient cerebral ischemic attack, unspecified: Secondary | ICD-10-CM

## 2010-07-27 DIAGNOSIS — Z8679 Personal history of other diseases of the circulatory system: Secondary | ICD-10-CM

## 2010-07-27 DIAGNOSIS — I10 Essential (primary) hypertension: Secondary | ICD-10-CM

## 2010-07-27 MED ORDER — PROPRANOLOL HCL 80 MG PO CP24: 80.0000 mg | ORAL_CAPSULE | Freq: Every day | ORAL | Status: DC

## 2010-07-27 MED ORDER — ATENOLOL 50 MG PO TABS: 50.0000 mg | ORAL_TABLET | Freq: Every day | ORAL | Status: DC

## 2010-07-27 MED ORDER — METOPROLOL SUCCINATE ER 50 MG PO TB24: 50.0000 mg | ORAL_TABLET | Freq: Every day | ORAL | Status: DC

## 2010-07-27 NOTE — Patient Instructions (Addendum)
Your physician has recommended you make the following change in your medication: You have been given prescriptions for 3 different beta blockers to take in any order. Do not take more than one prescription at a time. 1) Atenolol 50mg  one tablet daily. 2) Metoprolol succ 50mg  one tablet daily. 3) Inderal LA 80mg  one capsule daily.  Your physician has recommended that you have a Cardioversion (DCCV) after your coumadin levels have been therapeutic for 3 weeks. Electrical Cardioversion uses a jolt of electricity to your heart either through paddles or wired patches attached to your chest. This is a controlled, usually prescheduled, procedure. Defibrillation is done under light anesthesia in the hospital, and you usually go home the day of the procedure. This is done to get your heart back into a normal rhythm. You are not awake for the procedure. Please see the instruction sheet given to you today.

## 2010-07-27 NOTE — Assessment & Plan Note (Signed)
As above; maybe this will be improved by her beta blockers

## 2010-07-27 NOTE — Assessment & Plan Note (Signed)
The patient has persistent atrial fibrillation. Unfortunately her INR is subtherapeutic. We will work on restarting our clock. We will anticipate cardioversion when her INRs have been above 2 for 3 weeks.  Her blood pressure is higher today. This will allow [place] to try beta blocker therapy to try to augment rate control. She's been given prescriptions for atenolol 50, metoprolol succinate 50, and Inderal LA 80. She will take them randomly.

## 2010-07-27 NOTE — Progress Notes (Signed)
  HPI  Selena Taylor is a 75 y.o. female seen in followup for paroxysmal atrial fibrillation which has become persistent. We've tried to get her therapeutic with her INR. This is improved somewhat challenging. In the past blood pressure has precluded up titration of her rate controlling drugs.  Recently her husband has been moved to [place]. This is the major shift. It has brought to attention family dynamic issues with her daughter. She is working on this. It has been a very stressful time.    Past Medical History  Diagnosis Date  . TIA (transient ischemic attack)   . PAF (paroxysmal atrial fibrillation)   . HTN (hypertension)   . Gross hematuria   . Dysphagia   . Endometriosis   . Osteopenia   . Insomnia     Past Surgical History  Procedure Date  . Polypectomy   . Total hysterectomy and bilateral salpingoopherectomy   . Appendectomy   . Skin cancer removal     Current Outpatient Prescriptions  Medication Sig Dispense Refill  . B Complex-C (B-COMPLEX WITH VITAMIN C) tablet Take 1 tablet by mouth daily.        . Calcium Carbonate-Vitamin D (CALCIUM 500 + D PO) Take by mouth.        . Cholecalciferol (VITAMIN D) 1000 UNITS capsule Take 1,000 Units by mouth daily.        Marland Kitchen diltiazem (CARDIZEM CD) 240 MG 24 hr capsule Take 1 capsule (240 mg total) by mouth daily.  90 capsule  3  . flecainide (TAMBOCOR) 50 MG tablet Take 50 mg by mouth 2 (two) times daily.        . Grape Seed 100 MG CAPS Take by mouth.        . Omega-3 Fatty Acids (FISH OIL) 1000 MG CAPS Take by mouth.        . ramipril (ALTACE) 5 MG tablet Take 1 tablet (5 mg total) by mouth daily.  90 tablet  3  . warfarin (COUMADIN) 5 MG tablet Take 1 tablet (5 mg total) by mouth as directed.  90 tablet  3  . zolpidem (AMBIEN) 10 MG tablet Take 1 tablet (10 mg total) by mouth at bedtime as needed.  30 tablet  5    Allergies  Allergen Reactions  . Novocain     Review of Systems negative except from HPI and  PMH  Physical Exam Well developed and well nourished in no acute distress HENT normal E scleral and icterus clear Neck Supple Clear to ausculation Irregularly irregular rate which is rapid Soft with active bowel sounds No clubbing cyanosis and edema Alert and oriented, grossly normal motor and sensory function Skin Warm and Dry  ECG Atrial fibrillation at a rate of 121 Intervals-/0.08/0.33 Axis is 23 Nonspecific ST-T changes Assessment and  Plan

## 2010-07-28 NOTE — H&P (Signed)
Selena Taylor, Selena Taylor                  ACCOUNT NO.:  [id number]  MEDICAL RECORD NO.:  [id number]           PATIENT TYPE:  O  LOCATION:  3741                         FACILITY:  [facility]  PHYSICIAN:  Makaylia Hewett C. Nachmen Mansel, MD, FACCDATE OF BIRTH:  April 09, 1935  DATE OF ADMISSION:  06/13/2010 DATE OF DISCHARGE:                             HISTORY & PHYSICAL   PRIMARY CARDIOLOGIST:  Duke Salvia, MD, [facility]  PRIMARY CARE PHYSICIAN:  Rosalyn Gess. Norins, MD  REASON FOR ADMISSION:  Atrial fibrillation.  HISTORY OF PRESENT ILLNESS:  Selena Taylor is a very pleasant 75 year old female patient with history of paroxysmal atrial fibrillation, on chronic Coumadin therapy.  She saw Dr. Graciela Husbands in the office on Jun 08, 2010, and was noted to be back in atrial fibrillation at a heart rate of 110.  Cardioversion was offered to her at that time, but she deferred. She was placed on digoxin 0.25 mg daily in addition to her diltiazem 240 mg daily.  Since then, she has noted increasing tachy palpitations.  She also noticed some chest tightness.  She checked her blood pressure and heart rate today, and it was 169/110 with a heart rate of 144.  She decided to come to the emergency room.  She felt some chest tightness with this.  She denies any shortness of breath.  She denies any history of orthopnea or PND.  She denies syncope.  She has been dizzy.  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 She does significant amounts of heavy lifting and exertion without chest pain or shortness of breath.  She came in the emergency room, was placed on IV diltiazem at 10 mg per hour.  She converted to normal sinus rhythm just prior to my entering the room with heart rates in the 60s to 70s. She feels better and could tell that she has gone back in normal rhythm.  PAST MEDICAL HISTORY: 1. Paroxysmal atrial fibrillation.     a.     CHADS2 score of 3 with a history of hypertension and TIAs. 2.  Hypertension. 3. Depression/anxiety. 4. History of TIA. 5. History of gross hematuria in the past. 6. History of endometriosis. 7. Osteopenia.  PAST SURGICAL HISTORY: 1. Status post total abdominal hysterectomy and bilateral salpingo-     oophorectomy. 2. Status post appendectomy. 3. Status post melanoma excision. 4. Status post polypectomy.  ALLERGIES:  NOVOCAINE.  MEDICATIONS: 1. B complex vitamin plus C daily. 2. Calcium plus vitamin D daily. 3. Vitamin D 1000 units daily. 4. Digoxin 0.25 mg daily. 5. Diltiazem 240 mg daily. 6. Grape seed extract. 7. Fish oil 1000 mg daily. 8. Altace 5 mg daily. 9. Coumadin 2.5 mg daily except for 5 mg on Mondays, Wednesdays, and     Fridays. 10.Ambien 2 mg nightly p.r.n.  SOCIAL HISTORY:  The patient lives in [place] with her husband.  She is retired.  She denies tobacco or alcohol abuse.  FAMILY HISTORY:  Significant for CAD in her father who had bypass since 61s.  REVIEW OF SYSTEMS:  Please see HPI.  She denies any  fevers, chills, cough, melena, hematochezia, hematuria, dysuria, skin or hair changes. All other systems reviewed and are negative.  PHYSICAL EXAM:  GENERAL:  She is a well-nourished and well-developed female not in distress. VITAL SIGNS:  Blood pressure is 127/67, pulse is 68, respirations 18, temperature 98.6, oxygen saturation 99% on room air. HEENT:  Normal. NECK:  Without JVD. LYMPH:  Without lymphadenopathy. ENDOCRINE:  Without thyromegaly. VASCULAR:  Carotids without bruits bilaterally; dorsalis pedis and posterior tibials pulses are 2+ bilaterally. CARDIAC:  Normal S1 and S2.  Regular rate and rhythm without murmur. LUNGS:  Clear to auscultation bilaterally without wheezing, rhonchi, or rales. ABDOMEN:  Soft and nontender without organomegaly. EXTREMITIES:  Without edema. SKIN:  Warm and dry without rashes. MUSCULOSKELETAL:  Without joint deformity. Neurologic:  She is alert and oriented x3.   Cranial nerves II-XII grossly intact.  Strength is normal and equal in all extremities.  Chest x-ray negative.  EKG, initial atrial fibrillation with a heart rate of 128 and 1-2 mm of inferolateral ST depression.  Followup EKG demonstrates normal sinus rhythm, heart rate 63, normal axis, no acute changes.  LABORATORY DATA:  Hemoglobin 14.4.  Potassium 3.6, creatinine 0.58.  ALT 14, total protein 6.9, albumin 3.7.  INR 2.14.  Troponin I negative x1.  ASSESSMENT AND PLAN:  Paroxysmal atrial fibrillation:  The patient converted to normal sinus rhythm in the emergency room.  She was also interviewed and seen by Dr. Daleen Squibb.  She did note that she has been in and out of atrial fibrillation over the last several weeks.  She is currently on IV diltiazem 10 mg per hour and digoxin 0.25 mg daily.  She is on Coumadin and is therapeutic with her INR.  Her CHAD2 score is 3 with a history of hypertension and TIAs.  She currently feels better, but she did have inferolateral ST depression on her electrocardiogram and her heart rate was elevated.  She also noticed some chest tightness. We will observe her overnight and check serial cardiac markers.  We will plan on proceeding with outpatient stress Myoview study.  If this is negative for ischemia, we will likely place her on flecainide as an antiarrhythmic.  If her cardiac enzymes are positive for myocardial infarction, she will likely need cardiac catheterization as an inpatient.     Tereso Newcomer, PA-C   ______________________________ Jesse Sans Daleen Squibb, MD, [facility]    SW/MEDQ  D:  06/13/2010  T:  06/14/2010  Job:  [id number]  cc:   Duke Salvia, MD, [facility] Rosalyn Gess. Norins, MD  Electronically Signed by Tereso Newcomer PA-C on 07/27/2010 12:04:27 PM Electronically Signed by Valera Castle MD [facility] on 07/28/2010 10:35:53 AM

## 2010-08-03 ENCOUNTER — Ambulatory Visit (INDEPENDENT_AMBULATORY_CARE_PROVIDER_SITE_OTHER): Payer: Medicare Other | Admitting: *Deleted

## 2010-08-03 DIAGNOSIS — Z8679 Personal history of other diseases of the circulatory system: Secondary | ICD-10-CM

## 2010-08-03 DIAGNOSIS — G459 Transient cerebral ischemic attack, unspecified: Secondary | ICD-10-CM

## 2010-08-03 DIAGNOSIS — I4891 Unspecified atrial fibrillation: Secondary | ICD-10-CM

## 2010-08-03 LAB — POCT INR: INR: 2.8

## 2010-08-10 ENCOUNTER — Other Ambulatory Visit: Payer: Self-pay | Admitting: Internal Medicine

## 2010-08-10 ENCOUNTER — Ambulatory Visit (INDEPENDENT_AMBULATORY_CARE_PROVIDER_SITE_OTHER): Payer: Medicare Other | Admitting: *Deleted

## 2010-08-10 DIAGNOSIS — Z8679 Personal history of other diseases of the circulatory system: Secondary | ICD-10-CM

## 2010-08-10 DIAGNOSIS — G459 Transient cerebral ischemic attack, unspecified: Secondary | ICD-10-CM

## 2010-08-10 DIAGNOSIS — I4891 Unspecified atrial fibrillation: Secondary | ICD-10-CM

## 2010-08-17 ENCOUNTER — Ambulatory Visit (INDEPENDENT_AMBULATORY_CARE_PROVIDER_SITE_OTHER): Payer: Medicare Other | Admitting: *Deleted

## 2010-08-17 DIAGNOSIS — G459 Transient cerebral ischemic attack, unspecified: Secondary | ICD-10-CM

## 2010-08-17 DIAGNOSIS — Z8679 Personal history of other diseases of the circulatory system: Secondary | ICD-10-CM

## 2010-08-17 DIAGNOSIS — I4891 Unspecified atrial fibrillation: Secondary | ICD-10-CM

## 2010-08-24 ENCOUNTER — Telehealth: Payer: Self-pay | Admitting: Internal Medicine

## 2010-08-24 ENCOUNTER — Ambulatory Visit (INDEPENDENT_AMBULATORY_CARE_PROVIDER_SITE_OTHER): Payer: Medicare Other | Admitting: *Deleted

## 2010-08-24 DIAGNOSIS — I4891 Unspecified atrial fibrillation: Secondary | ICD-10-CM

## 2010-08-24 DIAGNOSIS — G459 Transient cerebral ischemic attack, unspecified: Secondary | ICD-10-CM

## 2010-08-24 DIAGNOSIS — Z8679 Personal history of other diseases of the circulatory system: Secondary | ICD-10-CM

## 2010-08-24 NOTE — Telephone Encounter (Signed)
Per pt call, pt wants to know if pt needs to continue taking metoprolol 50 mg. If so pt needs a RX of metoprolol called into [facility].  Pt said she has called about this 2x and wants a call back to inform pt is she does need to continue taking medication or not. Please return pt call to advise/discuss.

## 2010-08-24 NOTE — Telephone Encounter (Signed)
I spoke with the patient. She does have fatigue on the metoprolol. She has not tried any of the other 2 beta blockers that she was given. I have advised her to try another to see if she has the fatigue that she is feeling. She is agreeable to doing this. We will also aim at setting her up for DCCV on 09/04/10 at her request. I will call her back on Wednesday to discuss this with her.

## 2010-08-26 ENCOUNTER — Telehealth: Payer: Self-pay | Admitting: *Deleted

## 2010-08-26 ENCOUNTER — Encounter: Payer: Self-pay | Admitting: *Deleted

## 2010-08-26 DIAGNOSIS — I4891 Unspecified atrial fibrillation: Secondary | ICD-10-CM

## 2010-08-26 NOTE — Telephone Encounter (Signed)
Late entry: I left a message earlier today for the patient to call regarding her instructions for DCCV on 09/04/10.

## 2010-08-26 NOTE — Telephone Encounter (Signed)
Pt rtn call [phone] ok to leave message

## 2010-08-26 NOTE — Telephone Encounter (Signed)
I called the patient back and left a message that her DCCV is on 09/04/10 @ 11:00am and to arrive at 9:00am. I made her aware I will leave written instructions in the coumadin clinic for when she comes back on 8/6.

## 2010-08-26 NOTE — Telephone Encounter (Signed)
I left a message for the patient to call regarding her DCCV instructions. The instructions say Dr. Graciela Husbands will be doing her DCCV, but it is acutally Dr. Tenny Craw. The patient is scheduled to come on 08/31/10 for an INR. I will leave written instructions for her with [facility] after speaking with her.

## 2010-08-31 ENCOUNTER — Ambulatory Visit (INDEPENDENT_AMBULATORY_CARE_PROVIDER_SITE_OTHER): Payer: Medicare Other | Admitting: *Deleted

## 2010-08-31 DIAGNOSIS — G459 Transient cerebral ischemic attack, unspecified: Secondary | ICD-10-CM

## 2010-08-31 DIAGNOSIS — I4891 Unspecified atrial fibrillation: Secondary | ICD-10-CM

## 2010-08-31 DIAGNOSIS — Z8679 Personal history of other diseases of the circulatory system: Secondary | ICD-10-CM

## 2010-08-31 LAB — POCT INR: INR: 2.3

## 2010-09-04 ENCOUNTER — Ambulatory Visit (HOSPITAL_COMMUNITY)
Admission: RE | Admit: 2010-09-04 | Discharge: 2010-09-04 | Disposition: A | Discharge status: Home / Self Care | Payer: Medicare Other | Source: Ambulatory Visit | Attending: Internal Medicine | Admitting: Internal Medicine

## 2010-09-04 DIAGNOSIS — R9431 Abnormal electrocardiogram [ECG] [EKG]: Secondary | ICD-10-CM | POA: Insufficient documentation

## 2010-09-04 DIAGNOSIS — I4891 Unspecified atrial fibrillation: Secondary | ICD-10-CM | POA: Insufficient documentation

## 2010-09-07 ENCOUNTER — Other Ambulatory Visit: Payer: Self-pay | Admitting: Internal Medicine

## 2010-09-07 DIAGNOSIS — I4891 Unspecified atrial fibrillation: Secondary | ICD-10-CM

## 2010-09-07 NOTE — Telephone Encounter (Signed)
Pt wants refill but last office note was advised to try 3 different rx. This was July 2 or July 3 with 30 day rx each beta blocker.

## 2010-09-11 ENCOUNTER — Other Ambulatory Visit: Payer: Self-pay | Admitting: *Deleted

## 2010-09-16 ENCOUNTER — Ambulatory Visit (INDEPENDENT_AMBULATORY_CARE_PROVIDER_SITE_OTHER): Payer: Medicare Other | Admitting: *Deleted

## 2010-09-16 ENCOUNTER — Telehealth: Payer: Self-pay | Admitting: *Deleted

## 2010-09-16 ENCOUNTER — Encounter: Payer: Medicare Other | Admitting: *Deleted

## 2010-09-16 DIAGNOSIS — Z8679 Personal history of other diseases of the circulatory system: Secondary | ICD-10-CM

## 2010-09-16 DIAGNOSIS — I4891 Unspecified atrial fibrillation: Secondary | ICD-10-CM

## 2010-09-16 DIAGNOSIS — G459 Transient cerebral ischemic attack, unspecified: Secondary | ICD-10-CM

## 2010-09-16 NOTE — Telephone Encounter (Signed)
Per [facility], the patient has further questions about her medications. I have left a message for her to call.

## 2010-09-17 NOTE — Telephone Encounter (Signed)
I spoke with the patient. She states she does not know if she should continue atenolol or not. She states that when refills are requested on this, the office denies them. She states she could not tolerate metoprolol due to fatigue. She does have less fatigue on atenolol. I have advised her to try her third beta blocker, propranolol, and see how she does with that. She is agreeable with doing that and will contact me after she has taken propranolol. She will then let me know which beta blocker she likes the best, and we will then call in a more permanent RX for her. She is agreeable with this plan.

## 2010-09-17 NOTE — Telephone Encounter (Signed)
Returning call back to nurse.  

## 2010-10-11 ENCOUNTER — Other Ambulatory Visit: Payer: Self-pay | Admitting: Internal Medicine

## 2010-10-14 ENCOUNTER — Ambulatory Visit (INDEPENDENT_AMBULATORY_CARE_PROVIDER_SITE_OTHER): Payer: Medicare Other | Admitting: *Deleted

## 2010-10-14 DIAGNOSIS — I4891 Unspecified atrial fibrillation: Secondary | ICD-10-CM

## 2010-10-14 DIAGNOSIS — Z8679 Personal history of other diseases of the circulatory system: Secondary | ICD-10-CM

## 2010-10-14 DIAGNOSIS — G459 Transient cerebral ischemic attack, unspecified: Secondary | ICD-10-CM

## 2010-10-19 ENCOUNTER — Ambulatory Visit: Payer: Medicare Other

## 2010-10-23 ENCOUNTER — Encounter: Payer: Self-pay | Admitting: Internal Medicine

## 2010-10-30 ENCOUNTER — Other Ambulatory Visit: Payer: Self-pay | Admitting: Internal Medicine

## 2010-11-09 ENCOUNTER — Ambulatory Visit (INDEPENDENT_AMBULATORY_CARE_PROVIDER_SITE_OTHER): Payer: Medicare Other | Admitting: *Deleted

## 2010-11-09 DIAGNOSIS — G459 Transient cerebral ischemic attack, unspecified: Secondary | ICD-10-CM

## 2010-11-09 DIAGNOSIS — I4891 Unspecified atrial fibrillation: Secondary | ICD-10-CM

## 2010-11-09 DIAGNOSIS — Z8679 Personal history of other diseases of the circulatory system: Secondary | ICD-10-CM

## 2010-11-09 LAB — POCT INR: INR: 3.2

## 2010-12-01 ENCOUNTER — Other Ambulatory Visit: Payer: Self-pay | Admitting: Internal Medicine

## 2010-12-07 ENCOUNTER — Ambulatory Visit (INDEPENDENT_AMBULATORY_CARE_PROVIDER_SITE_OTHER): Payer: Medicare Other | Admitting: *Deleted

## 2010-12-07 DIAGNOSIS — Z8679 Personal history of other diseases of the circulatory system: Secondary | ICD-10-CM

## 2010-12-07 DIAGNOSIS — I4891 Unspecified atrial fibrillation: Secondary | ICD-10-CM

## 2010-12-07 DIAGNOSIS — G459 Transient cerebral ischemic attack, unspecified: Secondary | ICD-10-CM

## 2010-12-14 ENCOUNTER — Encounter: Payer: Self-pay | Admitting: Internal Medicine

## 2010-12-14 ENCOUNTER — Ambulatory Visit (INDEPENDENT_AMBULATORY_CARE_PROVIDER_SITE_OTHER): Payer: Medicare Other | Admitting: Internal Medicine

## 2010-12-14 VITALS — BP 128/70 | HR 51 | Ht 62.0 in | Wt 125.0 lb

## 2010-12-14 DIAGNOSIS — I4891 Unspecified atrial fibrillation: Secondary | ICD-10-CM

## 2010-12-14 MED ORDER — PROPRANOLOL HCL ER 80 MG PO CP24: ORAL_CAPSULE | ORAL | Status: DC

## 2010-12-14 MED ORDER — RAMIPRIL 5 MG PO TABS: 5.0000 mg | ORAL_TABLET | Freq: Every day | ORAL | Status: DC

## 2010-12-14 MED ORDER — DILTIAZEM HCL ER COATED BEADS 240 MG PO CP24: 240.0000 mg | ORAL_CAPSULE | Freq: Every day | ORAL | Status: DC

## 2010-12-14 MED ORDER — FLECAINIDE ACETATE 50 MG PO TABS: ORAL_TABLET | ORAL | Status: DC

## 2010-12-14 NOTE — Progress Notes (Signed)
  HPI  Selena Taylor is a 75 y.o. female seen in followup for paroxysmal atrial fibrillation which has become persistent. We've tried to get her therapeutic with her INR. This is improved somewhat challenging. In the past blood pressure has precluded up titration of her rate controlling drugs.  Recently her husband has been moved to [place]. This is the major shift. The dynamics in the family are better   The three beta bolockes all made her tired  She has had a couple of spells where she felt like her legs were not part of her although she could walk     Past Medical History  Diagnosis Date  . TIA (transient ischemic attack)   . PAF (paroxysmal atrial fibrillation)   . HTN (hypertension)   . Gross hematuria   . Dysphagia   . Endometriosis   . Osteopenia   . Insomnia     Past Surgical History  Procedure Date  . Polypectomy   . Total hysterectomy and bilateral salpingoopherectomy   . Appendectomy   . Skin cancer removal     Current Outpatient Prescriptions  Medication Sig Dispense Refill  . atenolol (TENORMIN) 50 MG tablet TAKE 1 TABLET BY MOUTH DAILY  30 tablet  6  . B Complex-C (B-COMPLEX WITH VITAMIN C) tablet Take 1 tablet by mouth daily.        . Calcium Carbonate-Vitamin D (CALCIUM 500 + D PO) Take by mouth.        . Cholecalciferol (VITAMIN D) 1000 UNITS capsule Take 1,000 Units by mouth daily.        Marland Kitchen diltiazem (CARDIZEM CD) 240 MG 24 hr capsule Take 1 capsule (240 mg total) by mouth daily.  90 capsule  3  . flecainide (TAMBOCOR) 50 MG tablet Take 50 mg by mouth daily.       . Grape Seed 100 MG CAPS Take by mouth.        . metoprolol (TOPROL-XL) 50 MG 24 hr tablet Take 1 tablet (50 mg total) by mouth daily.  30 tablet  0  . Omega-3 Fatty Acids (FISH OIL) 1000 MG CAPS Take by mouth.        . propranolol (INDERAL LA) 80 MG 24 hr capsule TAKE 1 CAPSULE BY MOUTH DAILY  30 capsule  0  . ramipril (ALTACE) 5 MG tablet Take 1 tablet (5 mg total) by mouth daily.  90  tablet  3  . warfarin (COUMADIN) 5 MG tablet Take 1 tablet (5 mg total) by mouth as directed.  90 tablet  3  . zolpidem (AMBIEN) 10 MG tablet Take 1 tablet (10 mg total) by mouth at bedtime as needed.  30 tablet  5    Allergies  Allergen Reactions  . Novocain     Review of Systems negative except from HPI and PMH  Physical Exam Well developed and well nourished in no acute distress HENT normal E scleral and icterus clear Neck Supple JVP flat; carotids brisk and full Clear to ausculation Regular rate and rhythm, no murmurs gallops or rub Soft with active bowel sounds No clubbing cyanosis and edema Alert and oriented, grossly normal motor and sensory function Skin Warm and Dry  ECG  Assessment and  Plan

## 2010-12-14 NOTE — Assessment & Plan Note (Signed)
reivewed meds   She will resume her flecanide as bidl  Will begin taking the betablcoker prn as she is having less afib

## 2010-12-14 NOTE — Patient Instructions (Signed)
Your physician has recommended you make the following change in your medication:  1) Increase flecainide to 50mg  one tablet twice daily. 2) Take inderal as needed only  Your physician wants you to follow-up in: 6 months. You will receive a reminder letter in the mail two months in advance. If you don't receive a letter, please call our office to schedule the follow-up appointment.

## 2010-12-18 ENCOUNTER — Ambulatory Visit: Payer: Medicare Other

## 2010-12-22 ENCOUNTER — Other Ambulatory Visit: Payer: Self-pay

## 2010-12-22 ENCOUNTER — Encounter (HOSPITAL_COMMUNITY): Payer: Self-pay | Admitting: Emergency Medicine

## 2010-12-22 ENCOUNTER — Emergency Department (HOSPITAL_COMMUNITY)
Admission: EM | Admit: 2010-12-22 | Discharge: 2010-12-22 | Disposition: A | Discharge status: Home / Self Care | Payer: Medicare Other | Attending: Emergency Medicine | Admitting: Emergency Medicine

## 2010-12-22 DIAGNOSIS — Z8673 Personal history of transient ischemic attack (TIA), and cerebral infarction without residual deficits: Secondary | ICD-10-CM | POA: Insufficient documentation

## 2010-12-22 DIAGNOSIS — Z79899 Other long term (current) drug therapy: Secondary | ICD-10-CM | POA: Insufficient documentation

## 2010-12-22 DIAGNOSIS — R002 Palpitations: Secondary | ICD-10-CM | POA: Insufficient documentation

## 2010-12-22 DIAGNOSIS — Z7901 Long term (current) use of anticoagulants: Secondary | ICD-10-CM | POA: Insufficient documentation

## 2010-12-22 DIAGNOSIS — I10 Essential (primary) hypertension: Secondary | ICD-10-CM | POA: Insufficient documentation

## 2010-12-22 DIAGNOSIS — R079 Chest pain, unspecified: Secondary | ICD-10-CM | POA: Insufficient documentation

## 2010-12-22 DIAGNOSIS — I4891 Unspecified atrial fibrillation: Secondary | ICD-10-CM | POA: Insufficient documentation

## 2010-12-22 LAB — COMPREHENSIVE METABOLIC PANEL
Alkaline Phosphatase: 77 U/L (ref 39–117)
BUN: 11 mg/dL (ref 6–23)
Creatinine, Ser: 0.61 mg/dL (ref 0.50–1.10)
GFR calc Af Amer: >90 mL/min (ref >90)
Glucose, Bld: 112 mg/dL — ABNORMAL HIGH (ref 70–99)
Potassium: 3.8 mEq/L (ref 3.5–5.1)
Total Bilirubin: 0.3 mg/dL (ref 0.3–1.2)
Total Protein: 7.5 g/dL (ref 6.0–8.3)

## 2010-12-22 LAB — CBC
HCT: 43.8 % (ref 36.0–46.0)
Hemoglobin: 14.1 g/dL (ref 12.0–15.0)
MCH: 31.3 pg (ref 26.0–34.0)
MCV: 97.1 fL (ref 78.0–100.0)
RBC: 4.51 MIL/uL (ref 3.87–5.11)

## 2010-12-22 LAB — CK TOTAL AND CKMB (NOT AT ARMC): Total CK: 99 U/L (ref 7–177)

## 2010-12-22 LAB — DIFFERENTIAL
Eosinophils Absolute: 0.3 10*3/uL (ref 0.0–0.7)
Lymphs Abs: 2 10*3/uL (ref 0.7–4.0)
Monocytes Absolute: 0.4 10*3/uL (ref 0.1–1.0)
Monocytes Relative: 7 % (ref 3–12)
Neutrophils Relative %: 56 % (ref 43–77)

## 2010-12-22 LAB — TROPONIN I: Troponin I: <0.3 ng/mL (ref <0.30)

## 2010-12-22 MED ORDER — DILTIAZEM HCL 25 MG/5ML IV SOLN
INTRAVENOUS | Status: AC
  Filled 2010-12-22: qty 5

## 2010-12-22 MED ORDER — DILTIAZEM HCL 50 MG/10ML IV SOLN
10.0000 mg | Freq: Once | INTRAVENOUS | Status: AC
  Administered 2010-12-22: 10 mg via INTRAVENOUS

## 2010-12-22 NOTE — ED Notes (Signed)
Pt was eatting dinner with husband. She lives at home. Pt reports she starting feeling weak and felt like she was going into afib. Pt has sign cardiac hx.

## 2010-12-22 NOTE — ED Provider Notes (Signed)
History     CSN: [id number] Arrival date & time: 12/22/2010  7:05 PM   First MD Initiated Contact with Patient 12/22/10 1943      Chief Complaint  Patient presents with  . Chest Pain    (Consider location/radiation/quality/duration/timing/severity/associated sxs/prior treatment) HPI Comments: History of paroxysmal Afib.  Was having dinner with husband at [facility] facility when she starting having palpitations, feeling like she was going into afib.  Has been admitted before for same.  No cause found in workup many years ago.  Patient is a 75 y.o. female presenting with palpitations.  Palpitations  This is a recurrent problem. The current episode started less than 1 hour ago. The problem occurs constantly. The problem has not changed since onset.The problem is associated with stress. Pertinent negatives include no diaphoresis, no fever, no malaise/fatigue, no numbness, no chest pain, no near-syncope, no orthopnea and no syncope.    Past Medical History  Diagnosis Date  . TIA (transient ischemic attack)   . PAF (paroxysmal atrial fibrillation)   . HTN (hypertension)   . Gross hematuria   . Dysphagia   . Endometriosis   . Osteopenia   . Insomnia     Past Surgical History  Procedure Date  . Polypectomy   . Total hysterectomy and bilateral salpingoopherectomy   . Appendectomy   . Skin cancer removal     No family history on file.  History  Substance Use Topics  . Smoking status: Never Smoker   . Smokeless tobacco: Not on file  . Alcohol Use: 8.4 oz/week    14 Glasses of wine per week    OB History    Grav Para Term Preterm Abortions TAB SAB Ect Mult Living                  Review of Systems  Constitutional: Negative for fever, malaise/fatigue and diaphoresis.  Cardiovascular: Positive for palpitations. Negative for chest pain, orthopnea, syncope and near-syncope.  Neurological: Negative for numbness.  All other systems reviewed and are  negative.    Allergies  Novocain  Home Medications   Current Outpatient Rx  Name Route Sig Dispense Refill  . B COMPLEX-C PO TABS Oral Take 1 tablet by mouth daily.      Marland Kitchen CALCIUM 500 + D PO Oral Take by mouth.      Marland Kitchen VITAMIN D 1000 UNITS PO CAPS Oral Take 1,000 Units by mouth daily.      Marland Kitchen DILTIAZEM HCL COATED BEADS 240 MG PO CP24 Oral Take 1 capsule (240 mg total) by mouth daily. 90 capsule 3  . FLECAINIDE ACETATE 50 MG PO TABS  Take one tablet by mouth twice daily 180 tablet 3  . GRAPE SEED 100 MG PO CAPS Oral Take by mouth.      Marland Kitchen FISH OIL 1000 MG PO CAPS Oral Take by mouth.      Marland Kitchen PROPRANOLOL HCL ER 80 MG PO CP24  Take one capsule by mouth daily as needed 90 capsule 1  . RAMIPRIL 5 MG PO TABS Oral Take 1 tablet (5 mg total) by mouth daily. 90 tablet 3  . WARFARIN SODIUM 5 MG PO TABS Oral Take 0.5-1 mg by mouth as directed. Takes 1 tablet on Monday Wednesday and Friday Takes half tab on Tuesday, Thursday, Saturday and Sunday     . ZOLPIDEM TARTRATE 10 MG PO TABS Oral Take 10 mg by mouth at bedtime as needed. For sleep       BP 154/86  Pulse 115  Temp(Src) 98.2 F (36.8 C) (Oral)  Resp 17  SpO2 100%  Physical Exam  Nursing note and vitals reviewed. Constitutional: She is oriented to person, place, and time. She appears well-developed and well-nourished. No distress.  HENT:  Head: Normocephalic and atraumatic.  Neck: Normal range of motion. Neck supple.  Cardiovascular:       Irregularly irregular.  Rapid.  Pulmonary/Chest: Effort normal and breath sounds normal. No respiratory distress. She has no wheezes.  Abdominal: Soft. Bowel sounds are normal. She exhibits no distension. There is no tenderness.  Musculoskeletal: Normal range of motion.  Neurological: She is alert and oriented to person, place, and time.  Skin: Skin is warm and dry. She is not diaphoretic.    ED Course  Procedures (including critical care time)  Labs Reviewed - No data to display No results  found.   No diagnosis found.   Date: 12/22/2010  Rate: 121  Rhythm: atrial fibrillation  QRS Axis: normal  Intervals: n/a  ST/T Wave abnormalities: nonspecific ST changes  Conduction Disutrbances:none  Narrative Interpretation:   Old EKG Reviewed: changes noted    MDM  The patient arrived in Afib with RVR.  She has a history of this and a cardiologist that follows her for it.  Once she arrived here, she was given 10 mg of iv cardizem and workup was initiated.  Shortly thereafter, she converted to a sinus rhythm.  Her laboratory studies were unremarkable, and the INR was 2.9.  After discussion with the patient, the decision was made to discharge her to home.  She is to follow up with her cardiologist in the next week and return should her symptoms worsen or change.        Geoffery Lyons, MD 12/22/10 [phone]

## 2010-12-23 ENCOUNTER — Telehealth: Payer: Self-pay | Admitting: Internal Medicine

## 2010-12-23 NOTE — Telephone Encounter (Signed)
New message:  Pt went to ED last night with a fib and was given a medication drip.  She came out of the atrial fib and is just very tired.  Will be out after 11:30 and back after 1:30. Please call her and discuss what she should do, if anything.

## 2010-12-23 NOTE — Telephone Encounter (Signed)
Patient was in the ED department last night with Atrial Fibrillation. Patient was treated and back in SR. Patient is to be seen in the office next week . An appointment was made for 12/31/10 at 12:00 noon patient aware.

## 2010-12-24 ENCOUNTER — Other Ambulatory Visit: Payer: Self-pay | Admitting: Internal Medicine

## 2010-12-25 ENCOUNTER — Ambulatory Visit
Admission: RE | Admit: 2010-12-25 | Discharge: 2010-12-25 | Disposition: A | Discharge status: Home / Self Care | Payer: Medicare Other | Source: Ambulatory Visit | Attending: Internal Medicine | Admitting: Internal Medicine

## 2010-12-25 DIAGNOSIS — Z1231 Encounter for screening mammogram for malignant neoplasm of breast: Secondary | ICD-10-CM

## 2010-12-31 ENCOUNTER — Ambulatory Visit (INDEPENDENT_AMBULATORY_CARE_PROVIDER_SITE_OTHER): Payer: Medicare Other | Admitting: *Deleted

## 2010-12-31 ENCOUNTER — Ambulatory Visit (INDEPENDENT_AMBULATORY_CARE_PROVIDER_SITE_OTHER): Payer: Medicare Other | Admitting: Physician Assistant

## 2010-12-31 ENCOUNTER — Encounter: Payer: Self-pay | Admitting: Physician Assistant

## 2010-12-31 VITALS — BP 112/60 | HR 46 | Ht 62.0 in | Wt 125.0 lb

## 2010-12-31 DIAGNOSIS — G459 Transient cerebral ischemic attack, unspecified: Secondary | ICD-10-CM

## 2010-12-31 DIAGNOSIS — I4891 Unspecified atrial fibrillation: Secondary | ICD-10-CM

## 2010-12-31 DIAGNOSIS — Z8679 Personal history of other diseases of the circulatory system: Secondary | ICD-10-CM

## 2010-12-31 MED ORDER — PROPRANOLOL HCL 60 MG PO CP24: 60.0000 mg | ORAL_CAPSULE | Freq: Every day | ORAL | Status: DC

## 2010-12-31 NOTE — Progress Notes (Signed)
lmom for pt to increase flecainide to 75 mg bid and to have a GXT 7-10 days after increasing flecainide. I will try to reach pt back tomorrow.  Danielle Rankin

## 2010-12-31 NOTE — Patient Instructions (Addendum)
Your physician recommends that you schedule a follow-up appointment in: 6 WEEKS WITH DR. Graciela Husbands PER Tereso Newcomer, A-C  Your physician has recommended you make the following change in your medication: STOP TAKING INDERAL 80 MG AND START TAKING INDERAL 60 MG DAILY A NEW PRESCRIPTION WAS CALLED IN TODAY.

## 2010-12-31 NOTE — Assessment & Plan Note (Addendum)
Maintaining sinus rhythm.  She was hospitalized in 5/12 and made a recent trip to the emergency room 11/12 with recurrent atrial fibrillation with rapid ventricular rate.  She is bradycardic.  She is tired and I feel that she is symptomatic from the bradycardia.  I will have her decrease her Inderal to 60 mg a day.  I will discuss her case further with Dr. Graciela Husbands this afternoon to decide whether or not we should increase her flecainide vs changing her to a different antiarrhythmic.  She remains on Coumadin therapy for stroke prophylaxis.  Followup with Dr. Graciela Husbands in 6 weeks.  I did speak with Dr. Graciela Husbands after the patient left the office.  We will increase her flecainide to 75 mg twice a day.  She will be set up for an ETT to rule out proarrhythmia 7-10 days after increasing her dose.

## 2010-12-31 NOTE — Progress Notes (Signed)
[address] [place], [place]  [address] Phone: [phone] Fax:  [phone]  Date:  12/31/2010   Name:  Selena Taylor       DOB:  Jul 24, 1935 MRN:  [id number]  PCP:  Dr. Debby Bud Primary Electrophysiologist:  Dr. Sherryl Manges    History of Present Illness: Selena Taylor is a 75 y.o. female presents for post ED follow up.  She has a history of paroxysmal atrial fibrillation.  It has recently become persistent.  Blood pressures have limited up titration of her rate controlling medications in the past.  She underwent a trial of 3 separate beta blockers that all made her tired recently with Dr. Graciela Husbands.  Other history includes a history of TIA, hypertension.  Cardiolite 8/03: Normal perfusion, EF greater than 70%.  Echocardiogram 1/04: Preserved LV contraction, no valvular abnormalities.  She was last seen by Dr. Graciela Husbands 11/19 and her flecainide was changed from qd to b.i.d. dosing.  Her beta blocker was changed to p.r.n. dosing.  He planmed to see her back in 6 months.  She presented to the emergency room 11/27 with atrial fibrillation.  Heart rate was 121.  She converted to normal sinus rhythm with IV diltiazem.  Followup EKG demonstrated sinus rhythm at a rate of 61.  Labs: Potassium 3.8, creatinine 0.61, ALT 17, hemoglobin 14.1, INR 2.4, troponin negative.  TSH was 0.865 and 5/12.  She was asked to follow up today.  She has been taking her propranolol 80 mg daily over the last month.  She feels tired.  She feels a soreness in her chest at times in the evening.  She denies exertional chest pain or shortness of breath.  She denies orthopnea, PND or edema.  She denies syncope or near-syncope.  She denies any further rapid palpitations since her visit to the emergency room.  Past Medical History  Diagnosis Date  . TIA (transient ischemic attack)   . PAF (paroxysmal atrial fibrillation)   . HTN (hypertension)   . Gross hematuria   . Dysphagia   . Endometriosis   .  Osteopenia   . Insomnia     Current Outpatient Prescriptions  Medication Sig Dispense Refill  . B Complex-C (B-COMPLEX WITH VITAMIN C) tablet Take 1 tablet by mouth daily.        . Calcium Carbonate-Vitamin D (CALCIUM 500 + D PO) Take by mouth.        . Cholecalciferol (VITAMIN D) 1000 UNITS capsule Take 1,000 Units by mouth daily.        Marland Kitchen diltiazem (CARDIZEM CD) 240 MG 24 hr capsule Take 1 capsule (240 mg total) by mouth daily.  90 capsule  3  . flecainide (TAMBOCOR) 50 MG tablet Take one tablet by mouth twice daily  180 tablet  3  . Grape Seed 100 MG CAPS Take by mouth.        . Omega-3 Fatty Acids (FISH OIL) 1000 MG CAPS Take by mouth.        . propranolol (INDERAL LA) 80 MG 24 hr capsule Take one capsule by mouth daily as needed  90 capsule  1  . ramipril (ALTACE) 5 MG tablet Take 1 tablet (5 mg total) by mouth daily.  90 tablet  3  . warfarin (COUMADIN) 5 MG tablet Take 0.5-1 mg by mouth as directed. Takes 1 tablet on Monday Wednesday and Friday Takes half tab on Tuesday, Thursday, Saturday and Sunday       . zolpidem (AMBIEN)  10 MG tablet Take 10 mg by mouth at bedtime as needed. For sleep         Allergies: Allergies  Allergen Reactions  . Novocain     Rapid heart rate    History  Substance Use Topics  . Smoking status: Never Smoker   . Smokeless tobacco: Not on file  . Alcohol Use: 8.4 oz/week    14 Glasses of wine per week     ROS:  Please see the history of present illness.   General ROS: positive for  - weight gain.  Psychological ROS: positive for - anxiety.  All other systems reviewed and negative.   PHYSICAL EXAM: VS:  BP 112/60  Pulse 46  Ht 5\' 2"  (1.575 m)  Wt 125 lb (56.7 kg)  BMI 22.86 kg/m2 Well nourished, well developed, in no acute distress HEENT: normal Neck: no JVD Cardiac:  normal S1, S2; RRR; no murmur Lungs:  clear to auscultation bilaterally, no wheezing, rhonchi or rales Abd: soft, nontender, no hepatomegaly Ext: no edema Skin: warm and  dry Neuro:  CNs 2-12 intact, no focal abnormalities noted  EKG:   Sinus bradycardia, heart rate 46, normal axis, no ischemic changes.  ASSESSMENT AND PLAN:

## 2011-01-01 ENCOUNTER — Other Ambulatory Visit: Payer: Self-pay | Admitting: Physician Assistant

## 2011-01-01 ENCOUNTER — Telehealth: Payer: Self-pay | Admitting: Internal Medicine

## 2011-01-01 ENCOUNTER — Telehealth: Payer: Self-pay | Admitting: *Deleted

## 2011-01-01 DIAGNOSIS — I4891 Unspecified atrial fibrillation: Secondary | ICD-10-CM

## 2011-01-01 NOTE — Telephone Encounter (Signed)
I spoke with the patient. She states that Danielle Rankin, CMA had already called her back.

## 2011-01-01 NOTE — Telephone Encounter (Signed)
Refill request for Zolpidem 10 mg tablets SIG take one tablet by mouth at bedtime prn for sleep QTY 30 Please advise refills

## 2011-01-01 NOTE — Telephone Encounter (Signed)
Fu call °Returning your call °

## 2011-01-01 NOTE — Telephone Encounter (Signed)
Ok for refill x 5 

## 2011-01-03 NOTE — Telephone Encounter (Signed)
k x 5 

## 2011-01-04 ENCOUNTER — Encounter: Payer: Medicare Other | Admitting: *Deleted

## 2011-01-04 MED ORDER — ZOLPIDEM TARTRATE 10 MG PO TABS: 10.0000 mg | ORAL_TABLET | Freq: Every evening | ORAL | Status: DC | PRN

## 2011-01-04 NOTE — Telephone Encounter (Signed)
Refill called in for pt.  

## 2011-01-13 ENCOUNTER — Ambulatory Visit (INDEPENDENT_AMBULATORY_CARE_PROVIDER_SITE_OTHER): Payer: Medicare Other | Admitting: Physician Assistant

## 2011-01-13 ENCOUNTER — Encounter (HOSPITAL_COMMUNITY): Payer: Self-pay | Admitting: Pharmacy Technician

## 2011-01-13 ENCOUNTER — Encounter: Payer: Self-pay | Admitting: *Deleted

## 2011-01-13 ENCOUNTER — Ambulatory Visit (INDEPENDENT_AMBULATORY_CARE_PROVIDER_SITE_OTHER): Payer: Medicare Other | Admitting: *Deleted

## 2011-01-13 DIAGNOSIS — Z8679 Personal history of other diseases of the circulatory system: Secondary | ICD-10-CM

## 2011-01-13 DIAGNOSIS — G459 Transient cerebral ischemic attack, unspecified: Secondary | ICD-10-CM

## 2011-01-13 DIAGNOSIS — I4891 Unspecified atrial fibrillation: Secondary | ICD-10-CM

## 2011-01-13 LAB — POCT INR: INR: 2.5

## 2011-01-13 MED ORDER — SODIUM CHLORIDE 0.9 % IV SOLN: 250.0000 mL | INTRAVENOUS | Status: DC

## 2011-01-13 MED ORDER — SODIUM CHLORIDE 0.9 % IJ SOLN: 3.0000 mL | Freq: Two times a day (BID) | INTRAMUSCULAR | Status: DC

## 2011-01-13 MED ORDER — SODIUM CHLORIDE 0.9 % IJ SOLN: 3.0000 mL | INTRAMUSCULAR | Status: DC | PRN

## 2011-01-13 MED ORDER — HYDROCORTISONE 1 % EX CREA: 1.0000 "application " | TOPICAL_CREAM | Freq: Three times a day (TID) | CUTANEOUS | Status: DC | PRN

## 2011-01-13 NOTE — Progress Notes (Signed)
Patient in today for ETT due to recent increase in flecainide.  However, ECG demonstrates AFib vs AFlutter with HR 100.  D/w Dr. [name] (DOD).  Will cancel ETT.  I will have her INR checked today.  I will speak to Dr. Sherryl Manges this afternoon re:  Change anti-arrhythmic drug vs adjusting flecainide vs proceeding with DCCV to restore NSR.    She has been advised to check her BP and HR tomorrow.  If HR still in 100s or higher, she will take Propranolol 80 mg instead of 60 mg.    I spoke with Dr. Sherryl Manges.  Will proceed with DCCV tomorrow.  She will come to the office in the AM and have an ECG.  Also check INR again.  If in AFib/Flutter and INR ok, proceed with DCCV.  Lab Results  Component Value Date   INR 2.5 01/13/2011   INR 2.2 12/31/2010   INR 2.94* 12/22/2010   PROTIME 17.1 07/09/2008    Tereso Newcomer, PA-C  5:55 PM 01/13/2011

## 2011-01-13 NOTE — Patient Instructions (Signed)
Check your blood pressure and heart rate in the morning. If BP is > 120 (top number) AND heart rate is 90-100 or higher, take Propranolol (Inderal) 80 mg instead of 60 mg.   If heart rate is less than 90, continue to take Propranolol 60 mg. I am going to talk to Dr. Sherryl Manges today about your heart rhythm and will be in touch with you.

## 2011-01-14 ENCOUNTER — Other Ambulatory Visit: Payer: Self-pay

## 2011-01-14 ENCOUNTER — Ambulatory Visit (HOSPITAL_COMMUNITY): Payer: Medicare Other | Admitting: *Deleted

## 2011-01-14 ENCOUNTER — Ambulatory Visit (INDEPENDENT_AMBULATORY_CARE_PROVIDER_SITE_OTHER): Payer: Medicare Other

## 2011-01-14 ENCOUNTER — Ambulatory Visit (HOSPITAL_COMMUNITY)
Admission: RE | Admit: 2011-01-14 | Discharge: 2011-01-14 | Disposition: A | Discharge status: Home / Self Care | Payer: Medicare Other | Source: Ambulatory Visit | Attending: Internal Medicine | Admitting: Internal Medicine

## 2011-01-14 ENCOUNTER — Encounter (HOSPITAL_COMMUNITY): Payer: Self-pay | Admitting: *Deleted

## 2011-01-14 ENCOUNTER — Encounter: Payer: Medicare Other | Admitting: *Deleted

## 2011-01-14 ENCOUNTER — Encounter (HOSPITAL_COMMUNITY)
Admission: RE | Disposition: A | Discharge status: Home / Self Care | Payer: Self-pay | Source: Ambulatory Visit | Attending: Internal Medicine

## 2011-01-14 DIAGNOSIS — I4891 Unspecified atrial fibrillation: Secondary | ICD-10-CM | POA: Insufficient documentation

## 2011-01-14 DIAGNOSIS — I4892 Unspecified atrial flutter: Secondary | ICD-10-CM

## 2011-01-14 HISTORY — PX: CARDIOVERSION: SHX1299

## 2011-01-14 LAB — CBC
Hemoglobin: 14.6 g/dL (ref 12.0–15.0)
MCH: 33.3 pg (ref 26.0–34.0)
MCHC: 33.5 g/dL (ref 30.0–36.0)
Platelets: 221 10*3/uL (ref 150–400)

## 2011-01-14 LAB — BASIC METABOLIC PANEL
Calcium: 9.3 mg/dL (ref 8.4–10.5)
GFR calc Af Amer: 83 mL/min — ABNORMAL LOW (ref >90)
GFR calc non Af Amer: 71 mL/min — ABNORMAL LOW (ref >90)
Potassium: 4.6 mEq/L (ref 3.5–5.1)
Sodium: 141 mEq/L (ref 135–145)

## 2011-01-14 SURGERY — CARDIOVERSION
Anesthesia: Monitor Anesthesia Care | Wound class: Clean

## 2011-01-14 MED ORDER — PROPOFOL 10 MG/ML IV EMUL
INTRAVENOUS | Status: DC | PRN
  Administered 2011-01-14: 100 mg via INTRAVENOUS

## 2011-01-14 MED ORDER — SODIUM CHLORIDE 0.9 % IJ SOLN: 3.0000 mL | Freq: Two times a day (BID) | INTRAMUSCULAR | Status: DC

## 2011-01-14 MED ORDER — SODIUM CHLORIDE 0.9 % IV SOLN: 250.0000 mL | INTRAVENOUS | Status: DC

## 2011-01-14 MED ORDER — SODIUM CHLORIDE 0.9 % IJ SOLN: 3.0000 mL | INTRAMUSCULAR | Status: DC | PRN

## 2011-01-14 MED ORDER — HYDROCORTISONE 1 % EX CREA
1.0000 "application " | TOPICAL_CREAM | Freq: Three times a day (TID) | CUTANEOUS | Status: DC | PRN
  Filled 2011-01-14: qty 28

## 2011-01-14 MED ORDER — SODIUM CHLORIDE 0.9 % IV SOLN
INTRAVENOUS | Status: DC | PRN
  Administered 2011-01-14: 14:00:00 via INTRAVENOUS

## 2011-01-14 NOTE — Procedures (Signed)
Electrical Cardioversion Procedure Note Selena Taylor [id number] Jun 05, 1935  Procedure: Electrical Cardioversion Indications:  Atrial Flutter  Procedure Details Consent: Risks of procedure as well as the alternatives and risks of each were explained to the (patient/caregiver).  Consent for procedure obtained. Time Out: Verified patient identification, verified procedure, site/side was marked, verified correct patient position, special equipment/implants available, medications/allergies/relevent history reviewed, required imaging and test results available.  Performed  Patient placed on cardiac monitor, pulse oximetry, supplemental oxygen as necessary.  Sedation given: propafol 100 mg Pacer pads placed anterior and posterior chest.  Cardioverted 2 time(s).  Cardioverted at 150J.  Evaluation Findings: Post procedure EKG shows: NSR Complications:The patient was initially given 120 J and converted promptly to sinus rhythm.  However 2 minutes later while still under anaesthesia she went back into atrial flutter and was shocked with 150J and converted to NSR again.  Patient did tolerate procedure well.   Cassell Clement 01/14/2011, 2:14 PM

## 2011-01-14 NOTE — Anesthesia Preprocedure Evaluation (Addendum)
Anesthesia Evaluation  Patient identified by MRN, date of birth, ID band Patient awake    Reviewed: Allergy & Precautions, H&P , NPO status , Patient's Chart, lab work & pertinent test results, reviewed documented beta blocker date and time   Airway Mallampati: II TM Distance: >3 FB Neck ROM: Full    Dental  (+) Dental Advisory Given and Teeth Intact   Pulmonary          Cardiovascular hypertension, Pt. on home beta blockers + dysrhythmias     Neuro/Psych    GI/Hepatic   Endo/Other    Renal/GU      Musculoskeletal   Abdominal   Peds  Hematology   Anesthesia Other Findings   Reproductive/Obstetrics                          Anesthesia Physical Anesthesia Plan  ASA: II  Anesthesia Plan: General   Post-op Pain Management:    Induction: Intravenous  Airway Management Planned: Mask  Additional Equipment:   Intra-op Plan:   Post-operative Plan:   Informed Consent:   Plan Discussed with: CRNA and Anesthesiologist  Anesthesia Plan Comments:         Anesthesia Quick Evaluation

## 2011-01-14 NOTE — Progress Notes (Signed)
Monitor NSR, remains alert oriented x 3 without complaints.  Discharge pending

## 2011-01-14 NOTE — Anesthesia Postprocedure Evaluation (Signed)
  Anesthesia Post-op Note  Patient: Selena Taylor  Procedure(s) Performed:  CARDIOVERSION - To be completed in Neuro OR 33 time slot 0830 12/20  Patient Location: PACU and Short Stay  Anesthesia Type: General  Level of Consciousness: awake and alert   Airway and Oxygen Therapy: Patient Spontanous Breathing and Patient connected to nasal cannula oxygen  Post-op Pain: none  Post-op Assessment: Post-op Vital signs reviewed, Patient's Cardiovascular Status Stable, Respiratory Function Stable, Patent Airway, No signs of Nausea or vomiting and Pain level controlled  Post-op Vital Signs: Reviewed and stable  Complications: No apparent anesthesia complications2

## 2011-01-14 NOTE — Transfer of Care (Signed)
Immediate Anesthesia Transfer of Care Note  Patient: Selena Taylor  Procedure(s) Performed:  CARDIOVERSION - To be completed in Neuro OR 33 time slot 0830 12/20  Patient Location: PACU  Anesthesia Type: General  Level of Consciousness: awake, oriented and patient cooperative  Airway & Oxygen Therapy: Patient Spontanous Breathing and Patient connected to nasal cannula oxygen  Post-op Assessment: Report given to PACU RN, Post -op Vital signs reviewed and stable and Patient moving all extremities X 4  Post vital signs: Reviewed and stable  Complications: No apparent anesthesia complications

## 2011-01-14 NOTE — H&P (View-Only) (Signed)
[address] [place], [place]  [address] Phone: [phone] Fax:  [phone]  Date:  12/31/2010   Name:  Selena Taylor       DOB:  01/21/1936 MRN:  [id number]  PCP:  Dr. Norins Primary Electrophysiologist:  Dr. Steven Klein    History of Present Illness: Selena Taylor is a 75 y.o. female presents for post ED follow up.  She has a history of paroxysmal atrial fibrillation.  It has recently become persistent.  Blood pressures have limited up titration of her rate controlling medications in the past.  She underwent a trial of 3 separate beta blockers that all made her tired recently with Dr. Klein.  Other history includes a history of TIA, hypertension.  Cardiolite 8/03: Normal perfusion, EF greater than 70%.  Echocardiogram 1/04: Preserved LV contraction, no valvular abnormalities.  She was last seen by Dr. Klein 11/19 and her flecainide was changed from qd to b.i.d. dosing.  Her beta blocker was changed to p.r.n. dosing.  He planmed to see her back in 6 months.  She presented to the emergency room 11/27 with atrial fibrillation.  Heart rate was 121.  She converted to normal sinus rhythm with IV diltiazem.  Followup EKG demonstrated sinus rhythm at a rate of 61.  Labs: Potassium 3.8, creatinine 0.61, ALT 17, hemoglobin 14.1, INR 2.4, troponin negative.  TSH was 0.865 and 5/12.  She was asked to follow up today.  She has been taking her propranolol 80 mg daily over the last month.  She feels tired.  She feels a soreness in her chest at times in the evening.  She denies exertional chest pain or shortness of breath.  She denies orthopnea, PND or edema.  She denies syncope or near-syncope.  She denies any further rapid palpitations since her visit to the emergency room.  Past Medical History  Diagnosis Date  . TIA (transient ischemic attack)   . PAF (paroxysmal atrial fibrillation)   . HTN (hypertension)   . Gross hematuria   . Dysphagia   . Endometriosis   .  Osteopenia   . Insomnia     Current Outpatient Prescriptions  Medication Sig Dispense Refill  . B Complex-C (B-COMPLEX WITH VITAMIN C) tablet Take 1 tablet by mouth daily.        . Calcium Carbonate-Vitamin D (CALCIUM 500 + D PO) Take by mouth.        . Cholecalciferol (VITAMIN D) 1000 UNITS capsule Take 1,000 Units by mouth daily.        . diltiazem (CARDIZEM CD) 240 MG 24 hr capsule Take 1 capsule (240 mg total) by mouth daily.  90 capsule  3  . flecainide (TAMBOCOR) 50 MG tablet Take one tablet by mouth twice daily  180 tablet  3  . Grape Seed 100 MG CAPS Take by mouth.        . Omega-3 Fatty Acids (FISH OIL) 1000 MG CAPS Take by mouth.        . propranolol (INDERAL LA) 80 MG 24 hr capsule Take one capsule by mouth daily as needed  90 capsule  1  . ramipril (ALTACE) 5 MG tablet Take 1 tablet (5 mg total) by mouth daily.  90 tablet  3  . warfarin (COUMADIN) 5 MG tablet Take 0.5-1 mg by mouth as directed. Takes 1 tablet on Monday Wednesday and Friday Takes half tab on Tuesday, Thursday, Saturday and Sunday       . zolpidem (AMBIEN)   10 MG tablet Take 10 mg by mouth at bedtime as needed. For sleep         Allergies: Allergies  Allergen Reactions  . Novocain     Rapid heart rate    History  Substance Use Topics  . Smoking status: Never Smoker   . Smokeless tobacco: Not on file  . Alcohol Use: 8.4 oz/week    14 Glasses of wine per week     ROS:  Please see the history of present illness.   General ROS: positive for  - weight gain.  Psychological ROS: positive for - anxiety.  All other systems reviewed and negative.   PHYSICAL EXAM: VS:  BP 112/60  Pulse 46  Ht 5' 2" (1.575 m)  Wt 125 lb (56.7 kg)  BMI 22.86 kg/m2 Well nourished, well developed, in no acute distress HEENT: normal Neck: no JVD Cardiac:  normal S1, S2; RRR; no murmur Lungs:  clear to auscultation bilaterally, no wheezing, rhonchi or rales Abd: soft, nontender, no hepatomegaly Ext: no edema Skin: warm and  dry Neuro:  CNs 2-12 intact, no focal abnormalities noted  EKG:   Sinus bradycardia, heart rate 46, normal axis, no ischemic changes.  ASSESSMENT AND PLAN:  

## 2011-01-14 NOTE — Anesthesia Procedure Notes (Signed)
Procedure Name: MAC Date/Time: 01/14/2011 2:10 PM Performed by: Malachi Pro Pre-anesthesia Checklist: Patient identified, Emergency Drugs available, Suction available, Patient being monitored and Timeout performed Patient Re-evaluated:Patient Re-evaluated prior to inductionPreoxygenation: Pre-oxygenation with 100% oxygen Intubation Type: IV induction Ventilation: Mask ventilation without difficulty

## 2011-01-14 NOTE — Anesthesia Postprocedure Evaluation (Signed)
  Anesthesia Post-op Note  Patient: Selena Taylor  Procedure(s) Performed:  CARDIOVERSION - To be completed in Neuro OR 33 time slot 0830 12/20  Patient Location: PACU  Anesthesia Type: General  Level of Consciousness: awake and oriented  Airway and Oxygen Therapy: Patient Spontanous Breathing and Patient connected to nasal cannula oxygen  Post-op Pain: none  Post-op Assessment: Post-op Vital signs reviewed and Patient's Cardiovascular Status Stable  Post-op Vital Signs: Reviewed and stable  Complications: No apparent anesthesia complications

## 2011-01-14 NOTE — Preoperative (Signed)
Beta Blockers   Reason not to administer Beta Blockers:Not Applicable 

## 2011-01-14 NOTE — Interval H&P Note (Signed)
History and Physical Interval Note:  01/14/2011 12:14 PM  Selena Taylor  has presented today for surgery, with the diagnosis of a-fib  The various methods of treatment have been discussed with the patient and family. After consideration of risks, benefits and other options for treatment, the patient has consented to  Procedure(s): CARDIOVERSION as a surgical intervention .  The patients' history has been reviewed, patient examined, no change in status, stable for surgery.  I have reviewed the patients' chart and labs.  Questions were answered to the patient's satisfaction.     Sherryl Manges

## 2011-01-15 ENCOUNTER — Encounter (HOSPITAL_COMMUNITY): Payer: Self-pay | Admitting: Internal Medicine

## 2011-01-22 NOTE — Progress Notes (Signed)
Patient in for ECG prior to DCCV.

## 2011-01-28 ENCOUNTER — Ambulatory Visit (INDEPENDENT_AMBULATORY_CARE_PROVIDER_SITE_OTHER): Payer: Medicare Other | Admitting: *Deleted

## 2011-01-28 DIAGNOSIS — I4891 Unspecified atrial fibrillation: Secondary | ICD-10-CM

## 2011-01-28 DIAGNOSIS — G459 Transient cerebral ischemic attack, unspecified: Secondary | ICD-10-CM

## 2011-01-28 DIAGNOSIS — Z8679 Personal history of other diseases of the circulatory system: Secondary | ICD-10-CM

## 2011-01-28 LAB — POCT INR: INR: 3.1

## 2011-02-10 ENCOUNTER — Encounter: Payer: Self-pay | Admitting: Internal Medicine

## 2011-02-10 ENCOUNTER — Ambulatory Visit (INDEPENDENT_AMBULATORY_CARE_PROVIDER_SITE_OTHER): Payer: Medicare Other | Admitting: Internal Medicine

## 2011-02-10 VITALS — BP 140/80 | HR 52 | Ht 62.5 in | Wt 128.8 lb

## 2011-02-10 DIAGNOSIS — R42 Dizziness and giddiness: Secondary | ICD-10-CM

## 2011-02-10 DIAGNOSIS — I4891 Unspecified atrial fibrillation: Secondary | ICD-10-CM

## 2011-02-10 NOTE — Assessment & Plan Note (Addendum)
While she has been enjoying sinus rhythm, it seems as if she is not tolerating the flecainide with dizziness and a malodor. We will discontinue the drug realizing the atrial fibrillation will recur and give her a week or 2 to let us know what happens with the above-mentioned side effects. In the interim will obtain a 2-D echo to see whether her atrial dimensions are sufficient to also consider catheter ablation  In the event that we pursue drug therapy we would use Rythmol initially. She is young for amiodarone and her QTC precludes the use of a type III

## 2011-02-10 NOTE — Progress Notes (Signed)
HPI  Selena Taylor is a 76 y.o. female seen in followup for paroxysmal atrial fibrillation which has become persistent. We've tried to get her therapeutic with her INR. This is improved somewhat challenging. In the past blood pressure has precluded up titration of her rate controlling drugs.  We have attempted to use flecandie to control her rhythm as opposed to beta blockers whichwere assoc withfatigue  She underwent cardioversion on 12/20  She complains of dizziness and a sense of malodor arising from her body  She is better off is sinus rhythm   Past Medical History  Diagnosis Date  . TIA (transient ischemic attack)   . PAF (paroxysmal atrial fibrillation)   . HTN (hypertension)   . Gross hematuria   . Dysphagia   . Endometriosis   . Osteopenia   . Insomnia     Past Surgical History  Procedure Date  . Polypectomy   . Total hysterectomy and bilateral salpingoopherectomy   . Appendectomy   . Skin cancer removal   . Cardioversion 01/14/2011    Procedure: CARDIOVERSION;  Surgeon: Duke Salvia, MD;  Location: [facility] OR;  Service: Cardiovascular;  Laterality: N/A;  To be completed in Neuro OR 33 time slot 0830 12/20    Current Outpatient Prescriptions  Medication Sig Dispense Refill  . B Complex-C (B-COMPLEX WITH VITAMIN C) tablet Take 1 tablet by mouth daily.        . Calcium Carbonate-Vitamin D (CALCIUM 500 + D PO) Take 1 tablet by mouth daily.       . Cholecalciferol (VITAMIN D) 1000 UNITS capsule Take 1,000 Units by mouth daily.       Marland Kitchen diltiazem (CARDIZEM CD) 240 MG 24 hr capsule Take 1 capsule (240 mg total) by mouth daily.  90 capsule  3  . flecainide (TAMBOCOR) 50 MG tablet Take 75 mg by mouth 2 (two) times daily.       . Grape Seed 100 MG CAPS Take 1 capsule by mouth daily.       . Omega-3 Fatty Acids (FISH OIL) 1000 MG CAPS Take 1,000 mg by mouth daily.       . propranolol (INDERAL LA) 60 MG 24 hr capsule Take 60 mg by mouth daily.        . ramipril (ALTACE) 5 MG tablet  Take 1 tablet (5 mg total) by mouth daily.  90 tablet  3  . warfarin (COUMADIN) 5 MG tablet Take 0.5-1 mg by mouth as directed. Takes 1 tablet on Monday Wednesday and Friday Takes half tab on Tuesday, Thursday, Saturday and Sunday       . zolpidem (AMBIEN) 10 MG tablet Take 1 tablet (10 mg total) by mouth at bedtime as needed. For sleep  30 tablet  5   Current Facility-Administered Medications  Medication Dose Route Frequency Provider Last Rate Last Dose  . 0.9 %  sodium chloride infusion  250 mL Intravenous Continuous [name], PA      . sodium chloride 0.9 % injection 3 mL  3 mL Intravenous Q12H Scott T Weaver, PA      . sodium chloride 0.9 % injection 3 mL  3 mL Intravenous PRN [name], PA        Allergies  Allergen Reactions  . Novocain     Rapid heart rate    Review of Systems negative except from HPI and PMH  Physical Exam BP 140/80  Pulse 52  Ht 5' 2.5" (1.588 m)  Wt 128  lb 12.8 oz (58.423 kg)  BMI 23.18 kg/m2 Well developed and well nourished in no acute distress HENT normal E scleral and icterus clear Neck Supple JVP flat; carotids brisk and full Clear to ausculation Slow but Regular rate and rhythm, no murmurs gallops or rub Soft with active bowel sounds No clubbing cyanosis none Edema Alert and oriented, grossly normal motor and sensory function Skin Warm and Dry  ECG sinus at 52 with QTc 464  Assessment and  Plan

## 2011-02-10 NOTE — Assessment & Plan Note (Signed)
As above Will see whether resolved with discontinuation of the drug

## 2011-02-10 NOTE — Patient Instructions (Addendum)
Your physician wants you to follow-up in: May 2013 with Dr Graciela Husbands ill receive a reminder letter in the mail two months in advance. If you don't receive a letter, please call our office to schedule the follow-up appointment.  Your physician has requested that you have an echocardiogram. Echocardiography is a painless test that uses sound waves to create images of your heart. It provides your doctor with information about the size and shape of your heart and how well your heart's chambers and valves are working. This procedure takes approximately one hour. There are no restrictions for this procedure.  Your physician has recommended you make the following change in your medication:  1) Stop Flecainide  Call the office in 2 weeks and let us know how your symptoms and side effects are doing.

## 2011-02-12 ENCOUNTER — Telehealth: Payer: Self-pay | Admitting: Internal Medicine

## 2011-02-12 NOTE — Telephone Encounter (Signed)
Spoke with patient and she rechecked blood pressure and it was 185/80.  Discussed with Dr [place] and will increase her Altace to 10 mg daily and see Lawson Fiscal NP on 1/21.  Advised patient.  Patient heart rate 62 but is unsure if she is A fib or not.

## 2011-02-12 NOTE — Telephone Encounter (Signed)
New problem Pt said she just took her  BP 186/80 no chest pain, no sob  She said she just feels shaky. She wants to know what she needs to do

## 2011-02-15 ENCOUNTER — Ambulatory Visit (INDEPENDENT_AMBULATORY_CARE_PROVIDER_SITE_OTHER): Payer: Medicare Other | Admitting: Nurse Practitioner

## 2011-02-15 ENCOUNTER — Encounter: Payer: Self-pay | Admitting: Nurse Practitioner

## 2011-02-15 VITALS — BP 146/86 | HR 80 | Ht 62.0 in | Wt 123.0 lb

## 2011-02-15 DIAGNOSIS — R5383 Other fatigue: Secondary | ICD-10-CM

## 2011-02-15 DIAGNOSIS — I10 Essential (primary) hypertension: Secondary | ICD-10-CM

## 2011-02-15 DIAGNOSIS — R5381 Other malaise: Secondary | ICD-10-CM

## 2011-02-15 DIAGNOSIS — I4891 Unspecified atrial fibrillation: Secondary | ICD-10-CM

## 2011-02-15 DIAGNOSIS — R42 Dizziness and giddiness: Secondary | ICD-10-CM

## 2011-02-15 DIAGNOSIS — R002 Palpitations: Secondary | ICD-10-CM

## 2011-02-15 LAB — CBC WITH DIFFERENTIAL/PLATELET
Basophils Absolute: 0 10*3/uL (ref 0.0–0.1)
Basophils Relative: 0.5 % (ref 0.0–3.0)
Eosinophils Absolute: 0.2 10*3/uL (ref 0.0–0.7)
Eosinophils Relative: 2.8 % (ref 0.0–5.0)
HCT: 43 % (ref 36.0–46.0)
Hemoglobin: 14.7 g/dL (ref 12.0–15.0)
Lymphocytes Relative: 25 % (ref 12.0–46.0)
Lymphs Abs: 1.8 10*3/uL (ref 0.7–4.0)
MCHC: 34.2 g/dL (ref 30.0–36.0)
MCV: 100.2 fl — ABNORMAL HIGH (ref 78.0–100.0)
Monocytes Absolute: 0.4 10*3/uL (ref 0.1–1.0)
Monocytes Relative: 6 % (ref 3.0–12.0)
Neutro Abs: 4.7 10*3/uL (ref 1.4–7.7)
Neutrophils Relative %: 65.7 % (ref 43.0–77.0)
Platelets: 211 10*3/uL (ref 150.0–400.0)
RBC: 4.3 Mil/uL (ref 3.87–5.11)
RDW: 13.2 % (ref 11.5–14.6)
WBC: 7.1 10*3/uL (ref 4.5–10.5)

## 2011-02-15 LAB — BASIC METABOLIC PANEL
BUN: 15 mg/dL (ref 6–23)
CO2: 27 mEq/L (ref 19–32)
Calcium: 8.9 mg/dL (ref 8.4–10.5)
Chloride: 105 mEq/L (ref 96–112)
Creatinine, Ser: 0.8 mg/dL (ref 0.4–1.2)
GFR: 76.89 mL/min (ref >60.00)
Glucose, Bld: 100 mg/dL — ABNORMAL HIGH (ref 70–99)
Potassium: 4.7 mEq/L (ref 3.5–5.1)
Sodium: 141 mEq/L (ref 135–145)

## 2011-02-15 LAB — TSH: TSH: 1 u[IU]/mL (ref 0.35–5.50)

## 2011-02-15 MED ORDER — MECLIZINE HCL 25 MG PO TABS: 25.0000 mg | ORAL_TABLET | Freq: Three times a day (TID) | ORAL | Status: AC | PRN

## 2011-02-15 MED ORDER — RAMIPRIL 10 MG PO CAPS: 10.0000 mg | ORAL_CAPSULE | Freq: Every day | ORAL | Status: DC

## 2011-02-15 NOTE — Assessment & Plan Note (Addendum)
Has had complaints of dizziness for almost 2 weeks. She does not feel like it is related to her rhythm. She saw no difference with the discontinuation of her flecainide. We will check some labs today. Will try her on some Meclizine prn. If she sees no improvement, I would favor CT of the head and probable ENT referral. Patient is agreeable to this plan and will call if any problems develop in the interim.    Discussed with Dr. Graciela Husbands on 02/16/11. EKG is reviewed with him as well. Her rate was controlled. She will proceed on with her echo tomorrow. She does need a follow up appointment. If her dizziness persists, will proceed with CT of the head and ENT referral. Will also need to readdress starting her Flecainide.   I have spoken to the patient by phone. She is much better with just one dose of Meclizine. I will see her in 2 weeks. She will get her echo. We will decide about her flecainide at that visit.

## 2011-02-15 NOTE — Assessment & Plan Note (Signed)
Blood pressure was up on Friday. Her altace has been increased. We will check BMET today. She will monitor at home. I am not convinced that her elevated blood pressure is related to the dizziness either.

## 2011-02-15 NOTE — Assessment & Plan Note (Signed)
She continues to have PAF. She is on coumadin. Levels have been therapeutic. She is no longer on Flecainide.

## 2011-02-15 NOTE — Progress Notes (Signed)
Selena Taylor Date of Birth: [birth year] Medical Record #[id number]  History of Present Illness: [name] is seen back today for a work in visit. She is seen for Dr. Graciela Husbands. She has a history of PAF. She remains on chronic coumadin. She had been on Flecainide but this was stopped last week at her appointment with Dr. Graciela Husbands. She is for an echo on Wednesday. The Flecainide was stopped due to dizziness. She sees no difference. She doesn't really think her dizziness is related to her rhythm. She notes that it is a feeling inside of her head. She denies the room spinning. She has not had syncope. No cough or URI reported. No sinus issues. No headaches. Has never tried meclizine. Reportedly has had remote CT of her head about 7 years ago. Never seen by ENT. Remains under a lot of stress with her husband. He is demented and lives in assisted living. She still provides a lot of his care. Her blood pressure was elevated on Friday and her Altace was increased. Blood pressure is better today, but her dizziness persists.   Current Outpatient Prescriptions on File Prior to Visit  Medication Sig Dispense Refill  . B Complex-C (B-COMPLEX WITH VITAMIN C) tablet Take 1 tablet by mouth daily.        . Cholecalciferol (VITAMIN D) 1000 UNITS capsule Take 1,000 Units by mouth daily.       Marland Kitchen diltiazem (CARDIZEM CD) 240 MG 24 hr capsule Take 1 capsule (240 mg total) by mouth daily.  90 capsule  3  . Grape Seed 100 MG CAPS Take 1 capsule by mouth daily.       . Omega-3 Fatty Acids (FISH OIL) 1000 MG CAPS Take 1,000 mg by mouth daily.       . propranolol (INDERAL LA) 60 MG 24 hr capsule Take 60 mg by mouth daily.        . ramipril (ALTACE) 5 MG tablet Take 10 mg by mouth daily.      Marland Kitchen warfarin (COUMADIN) 5 MG tablet Take 0.5-1 mg by mouth as directed. Takes 1 tablet on Monday Wednesday and Friday Takes half tab on Tuesday, Thursday, Saturday and Sunday       . zolpidem (AMBIEN) 10 MG tablet Take 1 tablet (10 mg total) by  mouth at bedtime as needed. For sleep  30 tablet  5  . DISCONTD: propranolol (INDERAL LA) 60 MG 24 hr capsule Take 1 capsule (60 mg total) by mouth daily.  30 capsule  11   Current Facility-Administered Medications on File Prior to Visit  Medication Dose Route Frequency Provider Last Rate Last Dose  . 0.9 %  sodium chloride infusion  250 mL Intravenous Continuous [name], PA      . sodium chloride 0.9 % injection 3 mL  3 mL Intravenous Q12H Scott T Weaver, PA      . sodium chloride 0.9 % injection 3 mL  3 mL Intravenous PRN [name], PA        Allergies  Allergen Reactions  . Novocain     Rapid heart rate    Past Medical History  Diagnosis Date  . TIA (transient ischemic attack)   . PAF (paroxysmal atrial fibrillation)   . HTN (hypertension)   . Gross hematuria   . Dysphagia   . Endometriosis   . Osteopenia   . Insomnia     Past Surgical History  Procedure Date  . Polypectomy   . Total hysterectomy and  bilateral salpingoopherectomy   . Appendectomy   . Skin cancer removal   . Cardioversion 01/14/2011    Procedure: CARDIOVERSION;  Surgeon: Duke Salvia, MD;  Location: [facility] OR;  Service: Cardiovascular;  Laterality: N/A;  To be completed in Neuro OR 33 time slot 0830 12/20    History  Smoking status  . Never Smoker   Smokeless tobacco  . Not on file    History  Alcohol Use  . 8.4 oz/week  . 14 Glasses of wine per week    History reviewed. No pertinent family history.  Review of Systems: The review of systems is positive for dizziness. No chest pain. No shortness of breath.  All other systems were reviewed and are negative.  Physical Exam: BP 146/86  Pulse 80  Ht 5\' 2"  (1.575 m)  Wt 123 lb (55.792 kg)  BMI 22.50 kg/m2 Patient is very pleasant and in no acute distress. She looks younger than her stated age. Skin is warm and dry. Color is normal.  HEENT is unremarkable. Normocephalic/atraumatic. PERRL. Sclera are nonicteric. Neck is supple. No  masses. No JVD. Lungs are clear. Cardiac exam shows a regular rate and rhythm. But her EKG did catch her in atrial fib/flutter with a controlled rate. Abdomen is soft. Extremities are without edema. Gait and ROM are intact. No gross neurologic deficits noted.   LABORATORY DATA: EKG shows sinus rhythm and then with atrial fib/flutter. Her rate was controlled. No pauses noted.   Assessment / Plan:

## 2011-02-15 NOTE — Patient Instructions (Addendum)
Stay on the 10 mg of Altace daily. I have sent the prescription to the drug store.   You may try some Meclizine 25 mg up to three times a day for dizziness. This prescription is at the drug store.  Monitor your blood pressure at home and keep a diary.   We will check some labs today.  Keep your appointment for your echo for later this week.  Call the [facility] office at [phone] if you have any questions, problems or concerns.

## 2011-02-17 ENCOUNTER — Ambulatory Visit (HOSPITAL_COMMUNITY): Payer: Medicare Other | Attending: Internal Medicine | Admitting: Radiology

## 2011-02-17 DIAGNOSIS — Z8673 Personal history of transient ischemic attack (TIA), and cerebral infarction without residual deficits: Secondary | ICD-10-CM | POA: Insufficient documentation

## 2011-02-17 DIAGNOSIS — I4891 Unspecified atrial fibrillation: Secondary | ICD-10-CM | POA: Insufficient documentation

## 2011-02-17 DIAGNOSIS — R5381 Other malaise: Secondary | ICD-10-CM | POA: Insufficient documentation

## 2011-02-17 DIAGNOSIS — R5383 Other fatigue: Secondary | ICD-10-CM | POA: Insufficient documentation

## 2011-02-17 DIAGNOSIS — R002 Palpitations: Secondary | ICD-10-CM | POA: Insufficient documentation

## 2011-02-17 DIAGNOSIS — I10 Essential (primary) hypertension: Secondary | ICD-10-CM | POA: Insufficient documentation

## 2011-02-17 DIAGNOSIS — R42 Dizziness and giddiness: Secondary | ICD-10-CM | POA: Insufficient documentation

## 2011-02-17 DIAGNOSIS — I059 Rheumatic mitral valve disease, unspecified: Secondary | ICD-10-CM

## 2011-02-25 ENCOUNTER — Ambulatory Visit (INDEPENDENT_AMBULATORY_CARE_PROVIDER_SITE_OTHER): Payer: Medicare Other

## 2011-02-25 ENCOUNTER — Ambulatory Visit (INDEPENDENT_AMBULATORY_CARE_PROVIDER_SITE_OTHER): Payer: Medicare Other | Admitting: Nurse Practitioner

## 2011-02-25 ENCOUNTER — Encounter: Payer: Medicare Other | Admitting: *Deleted

## 2011-02-25 ENCOUNTER — Encounter: Payer: Self-pay | Admitting: Nurse Practitioner

## 2011-02-25 VITALS — BP 120/78 | HR 60 | Ht 62.0 in | Wt 125.0 lb

## 2011-02-25 DIAGNOSIS — R42 Dizziness and giddiness: Secondary | ICD-10-CM

## 2011-02-25 DIAGNOSIS — G459 Transient cerebral ischemic attack, unspecified: Secondary | ICD-10-CM

## 2011-02-25 DIAGNOSIS — Z8679 Personal history of other diseases of the circulatory system: Secondary | ICD-10-CM

## 2011-02-25 DIAGNOSIS — I10 Essential (primary) hypertension: Secondary | ICD-10-CM

## 2011-02-25 DIAGNOSIS — I4891 Unspecified atrial fibrillation: Secondary | ICD-10-CM

## 2011-02-25 LAB — BASIC METABOLIC PANEL
BUN: 19 mg/dL (ref 6–23)
CO2: 29 mEq/L (ref 19–32)
Calcium: 8.9 mg/dL (ref 8.4–10.5)
Chloride: 105 mEq/L (ref 96–112)
Creatinine, Ser: 0.9 mg/dL (ref 0.4–1.2)
GFR: 66.02 mL/min (ref >60.00)
Glucose, Bld: 82 mg/dL (ref 70–99)
Potassium: 3.9 mEq/L (ref 3.5–5.1)
Sodium: 143 mEq/L (ref 135–145)

## 2011-02-25 NOTE — Patient Instructions (Addendum)
Stay on your current medicines.  We are going to recheck your kidney function and potassium level today.  Call the [facility] office at [phone] if you have any questions, problems or concerns.   We will see you back in 4 months.

## 2011-02-25 NOTE — Assessment & Plan Note (Signed)
Blood pressure looks great. No change in her current medicines. She will continue to monitor at home.

## 2011-02-25 NOTE — Assessment & Plan Note (Signed)
She is basically unaware of her atrial fib. She is committed to her coumadin. She really does not want to restart the Flecainide and I do not think we can make her feel any better than she is currently feeling. She is happy with this plan. We will see her back in 4 months. Patient is agreeable to this plan and will call if any problems develop in the interim.

## 2011-02-25 NOTE — Assessment & Plan Note (Signed)
She is greatly improved with the Meclizine and thinks this was more of a "bug". No need for ENT or CT scan at this time.

## 2011-02-25 NOTE — Progress Notes (Signed)
Selena Taylor Date of Birth: 1935-10-17 Medical Record #[id number]  History of Present Illness: Selena Taylor is seen back today for a follow up visit. She is seen for Dr. Graciela Husbands. She has PAF and is on chronic coumadin. She has had issues with dizziness. There was felt to be some relation to her Flecainide and this was stopped. When I saw her back earlier in the month, I gave her some Meclizine and left her off of the Flecainide.  She is on higher doses of Altace for better blood pressure control.  She comes in today. She is doing so much better. Her dizziness has improved. She is using Meclizine prn. Her blood pressure has been great. She is really not aware of her atrial fib and does not wish to resume the Flecainide. Her echo was satisfactory with mild LAE and normal systolic function.   Current Outpatient Prescriptions on File Prior to Visit  Medication Sig Dispense Refill  . B Complex-C (B-COMPLEX WITH VITAMIN C) tablet Take 1 tablet by mouth daily.        . Cholecalciferol (VITAMIN D) 1000 UNITS capsule Take 1,000 Units by mouth daily.       Marland Kitchen diltiazem (CARDIZEM CD) 240 MG 24 hr capsule Take 1 capsule (240 mg total) by mouth daily.  90 capsule  3  . Grape Seed 100 MG CAPS Take 1 capsule by mouth daily.       . meclizine (ANTIVERT) 25 MG tablet Take 1 tablet (25 mg total) by mouth 3 (three) times daily as needed.  30 tablet  0  . Omega-3 Fatty Acids (FISH OIL) 1000 MG CAPS Take 1,000 mg by mouth daily.       . propranolol (INDERAL LA) 60 MG 24 hr capsule Take 60 mg by mouth daily.        . ramipril (ALTACE) 10 MG capsule Take 1 capsule (10 mg total) by mouth daily.  30 capsule  11  . warfarin (COUMADIN) 5 MG tablet Take 0.5-1 mg by mouth as directed. Takes 1 tablet on Monday Wednesday and Friday Takes half tab on Tuesday, Thursday, Saturday and Sunday       . zolpidem (AMBIEN) 10 MG tablet Take 1 tablet (10 mg total) by mouth at bedtime as needed. For sleep  30 tablet  5  . DISCONTD: propranolol  (INDERAL LA) 60 MG 24 hr capsule Take 1 capsule (60 mg total) by mouth daily.  30 capsule  11   Current Facility-Administered Medications on File Prior to Visit  Medication Dose Route Frequency Provider Last Rate Last Dose  . 0.9 %  sodium chloride infusion  250 mL Intravenous Continuous [name], PA      . sodium chloride 0.9 % injection 3 mL  3 mL Intravenous Q12H Scott T Weaver, PA      . sodium chloride 0.9 % injection 3 mL  3 mL Intravenous PRN [name], PA        Allergies  Allergen Reactions  . Novocain     Rapid heart rate    Past Medical History  Diagnosis Date  . TIA (transient ischemic attack)   . PAF (paroxysmal atrial fibrillation)     has been on Flecainide in the past. Stopped 02/10/11; Remains on coumadin anticoagulation  . HTN (hypertension)   . Gross hematuria   . Dysphagia   . Endometriosis   . Osteopenia   . Insomnia   . Chronic anticoagulation     on coumadin  .  Dizziness     improved with Meclizine    Past Surgical History  Procedure Date  . Polypectomy   . Total hysterectomy and bilateral salpingoopherectomy   . Appendectomy   . Skin cancer removal   . Cardioversion 01/14/2011    Procedure: CARDIOVERSION;  Surgeon: Duke Salvia, MD;  Location: [facility] OR;  Service: Cardiovascular;  Laterality: N/A;  To be completed in Neuro OR 33 time slot 0830 12/20    History  Smoking status  . Never Smoker   Smokeless tobacco  . Not on file    History  Alcohol Use  . 8.4 oz/week  . 14 Glasses of wine per week    No family history on file.  Review of Systems: The review of systems is per the HPI. She remains under a lot of stress in caring for her husband but seems to have better perspective in that regards. We talked about this extensively at her last visit.  All other systems were reviewed and are negative.  Physical Exam: BP 120/78  Pulse 60  Ht 5\' 2"  (1.575 m)  Wt 125 lb (56.7 kg)  BMI 22.86 kg/m2 Patient is very pleasant and in no  acute distress. Skin is warm and dry. Color is normal.  HEENT is unremarkable. Normocephalic/atraumatic. PERRL. Sclera are nonicteric. Neck is supple. No masses. No JVD. Lungs are clear. Cardiac exam shows a regular rate and rhythm today. Abdomen is soft. Extremities are without edema. Gait and ROM are intact. No gross neurologic deficits noted.   LABORATORY DATA: BMET is pending   Assessment / Plan:

## 2011-03-25 ENCOUNTER — Ambulatory Visit (INDEPENDENT_AMBULATORY_CARE_PROVIDER_SITE_OTHER): Payer: Medicare Other

## 2011-03-25 DIAGNOSIS — Z8679 Personal history of other diseases of the circulatory system: Secondary | ICD-10-CM

## 2011-03-25 DIAGNOSIS — I4891 Unspecified atrial fibrillation: Secondary | ICD-10-CM

## 2011-03-25 DIAGNOSIS — G459 Transient cerebral ischemic attack, unspecified: Secondary | ICD-10-CM

## 2011-04-15 ENCOUNTER — Ambulatory Visit (INDEPENDENT_AMBULATORY_CARE_PROVIDER_SITE_OTHER): Payer: Medicare Other | Admitting: Pharmacist

## 2011-04-15 DIAGNOSIS — Z8679 Personal history of other diseases of the circulatory system: Secondary | ICD-10-CM

## 2011-04-15 DIAGNOSIS — G459 Transient cerebral ischemic attack, unspecified: Secondary | ICD-10-CM

## 2011-04-15 DIAGNOSIS — I4891 Unspecified atrial fibrillation: Secondary | ICD-10-CM

## 2011-04-16 ENCOUNTER — Telehealth: Payer: Self-pay | Admitting: *Deleted

## 2011-04-16 DIAGNOSIS — I4891 Unspecified atrial fibrillation: Secondary | ICD-10-CM

## 2011-04-16 NOTE — Telephone Encounter (Signed)
At that dose it is unlikely it is responsible for the dizziness.lets try atenolol 25 in its stead

## 2011-04-16 NOTE — Telephone Encounter (Signed)
Muse, Franchot Mimes, RN - Dizziness More Detail >>      Dizziness      Raul Del, RN        Sent: Thu April 15, 2011 11:33 AM    To: Jefferey Pica, RN        ZANOVIA ROTZ    MRN: [id number] DOB: 1936/01/04     Pt Home: [phone]               Message     Pt complained of dizziness and feeling bad, on Propanolol 60mg s. Pt wants to know if dose can be lowered? Please call her [phone]. Thanks.      Copied from [employer]. Will forward to Dr. Graciela Husbands for review. Sherri Rad, RN, BSN

## 2011-04-19 MED ORDER — ATENOLOL 25 MG PO TABS: 25.0000 mg | ORAL_TABLET | Freq: Every day | ORAL | Status: DC

## 2011-04-19 NOTE — Telephone Encounter (Signed)
I spoke with the patient. She is aware of Dr. Odessa Fleming recommendations. She will d/c propranolol and start atenolol 25 mg once daily. I have advised if she wants to wait a day or two after stopping the propranolol before starting the atenolol to see if her symptoms completely resolve, she may do this. I also inquired if she still has meclizine to take. She states she did not get this filled. I explained if her symptoms persist with the med change, she should fill her meclizine prescription. She voices understanding.

## 2011-05-01 ENCOUNTER — Encounter (HOSPITAL_COMMUNITY): Payer: Self-pay | Admitting: *Deleted

## 2011-05-01 ENCOUNTER — Other Ambulatory Visit: Payer: Self-pay

## 2011-05-01 ENCOUNTER — Telehealth: Payer: Self-pay | Admitting: Physician Assistant

## 2011-05-01 ENCOUNTER — Inpatient Hospital Stay (HOSPITAL_COMMUNITY)
Admission: EM | Admit: 2011-05-01 | Discharge: 2011-05-03 | DRG: 310 | Disposition: A | Discharge status: Home / Self Care | Payer: Medicare Other | Attending: Internal Medicine | Admitting: Internal Medicine

## 2011-05-01 ENCOUNTER — Emergency Department (HOSPITAL_COMMUNITY): Payer: Medicare Other

## 2011-05-01 DIAGNOSIS — M899 Disorder of bone, unspecified: Secondary | ICD-10-CM | POA: Diagnosis present

## 2011-05-01 DIAGNOSIS — Z7901 Long term (current) use of anticoagulants: Secondary | ICD-10-CM

## 2011-05-01 DIAGNOSIS — I480 Paroxysmal atrial fibrillation: Secondary | ICD-10-CM

## 2011-05-01 DIAGNOSIS — I4891 Unspecified atrial fibrillation: Principal | ICD-10-CM | POA: Diagnosis present

## 2011-05-01 DIAGNOSIS — Z8673 Personal history of transient ischemic attack (TIA), and cerebral infarction without residual deficits: Secondary | ICD-10-CM

## 2011-05-01 DIAGNOSIS — G4700 Insomnia, unspecified: Secondary | ICD-10-CM | POA: Diagnosis present

## 2011-05-01 DIAGNOSIS — I959 Hypotension, unspecified: Secondary | ICD-10-CM | POA: Diagnosis present

## 2011-05-01 DIAGNOSIS — I10 Essential (primary) hypertension: Secondary | ICD-10-CM | POA: Diagnosis present

## 2011-05-01 DIAGNOSIS — M949 Disorder of cartilage, unspecified: Secondary | ICD-10-CM | POA: Diagnosis present

## 2011-05-01 LAB — BASIC METABOLIC PANEL
BUN: 13 mg/dL (ref 6–23)
Calcium: 9 mg/dL (ref 8.4–10.5)
Creatinine, Ser: 0.69 mg/dL (ref 0.50–1.10)
GFR calc Af Amer: >90 mL/min (ref >90)
GFR calc non Af Amer: 84 mL/min — ABNORMAL LOW (ref >90)

## 2011-05-01 LAB — URINALYSIS, ROUTINE W REFLEX MICROSCOPIC
Bilirubin Urine: NEGATIVE
Ketones, ur: 40 mg/dL — AB
Leukocytes, UA: NEGATIVE
Nitrite: NEGATIVE
Protein, ur: NEGATIVE mg/dL

## 2011-05-01 LAB — CBC
HCT: 43.6 % (ref 36.0–46.0)
Hemoglobin: 15 g/dL (ref 12.0–15.0)
RDW: 13.3 % (ref 11.5–15.5)
WBC: 5.3 10*3/uL (ref 4.0–10.5)

## 2011-05-01 LAB — POCT I-STAT TROPONIN I: Troponin i, poc: 0.01 ng/mL (ref 0.00–0.08)

## 2011-05-01 LAB — POCT I-STAT, CHEM 8
BUN: 14 mg/dL (ref 6–23)
Calcium, Ion: 1.14 mmol/L (ref 1.12–1.32)
Chloride: 104 mEq/L (ref 96–112)
Creatinine, Ser: 0.8 mg/dL (ref 0.50–1.10)
TCO2: 25 mmol/L (ref 0–100)

## 2011-05-01 LAB — TROPONIN I: Troponin I: <0.3 ng/mL (ref <0.30)

## 2011-05-01 LAB — CARDIAC PANEL(CRET KIN+CKTOT+MB+TROPI): CK, MB: 1.9 ng/mL (ref 0.3–4.0)

## 2011-05-01 IMAGING — CR DG CHEST 2V
2 series · 2 of 2 positions shown · non-contrast
Comparison: [DATE]

CLINICAL DATA: Midsternal chest pain and atrial fibrillation.

CHEST - 2 VIEW

[w chest pa]
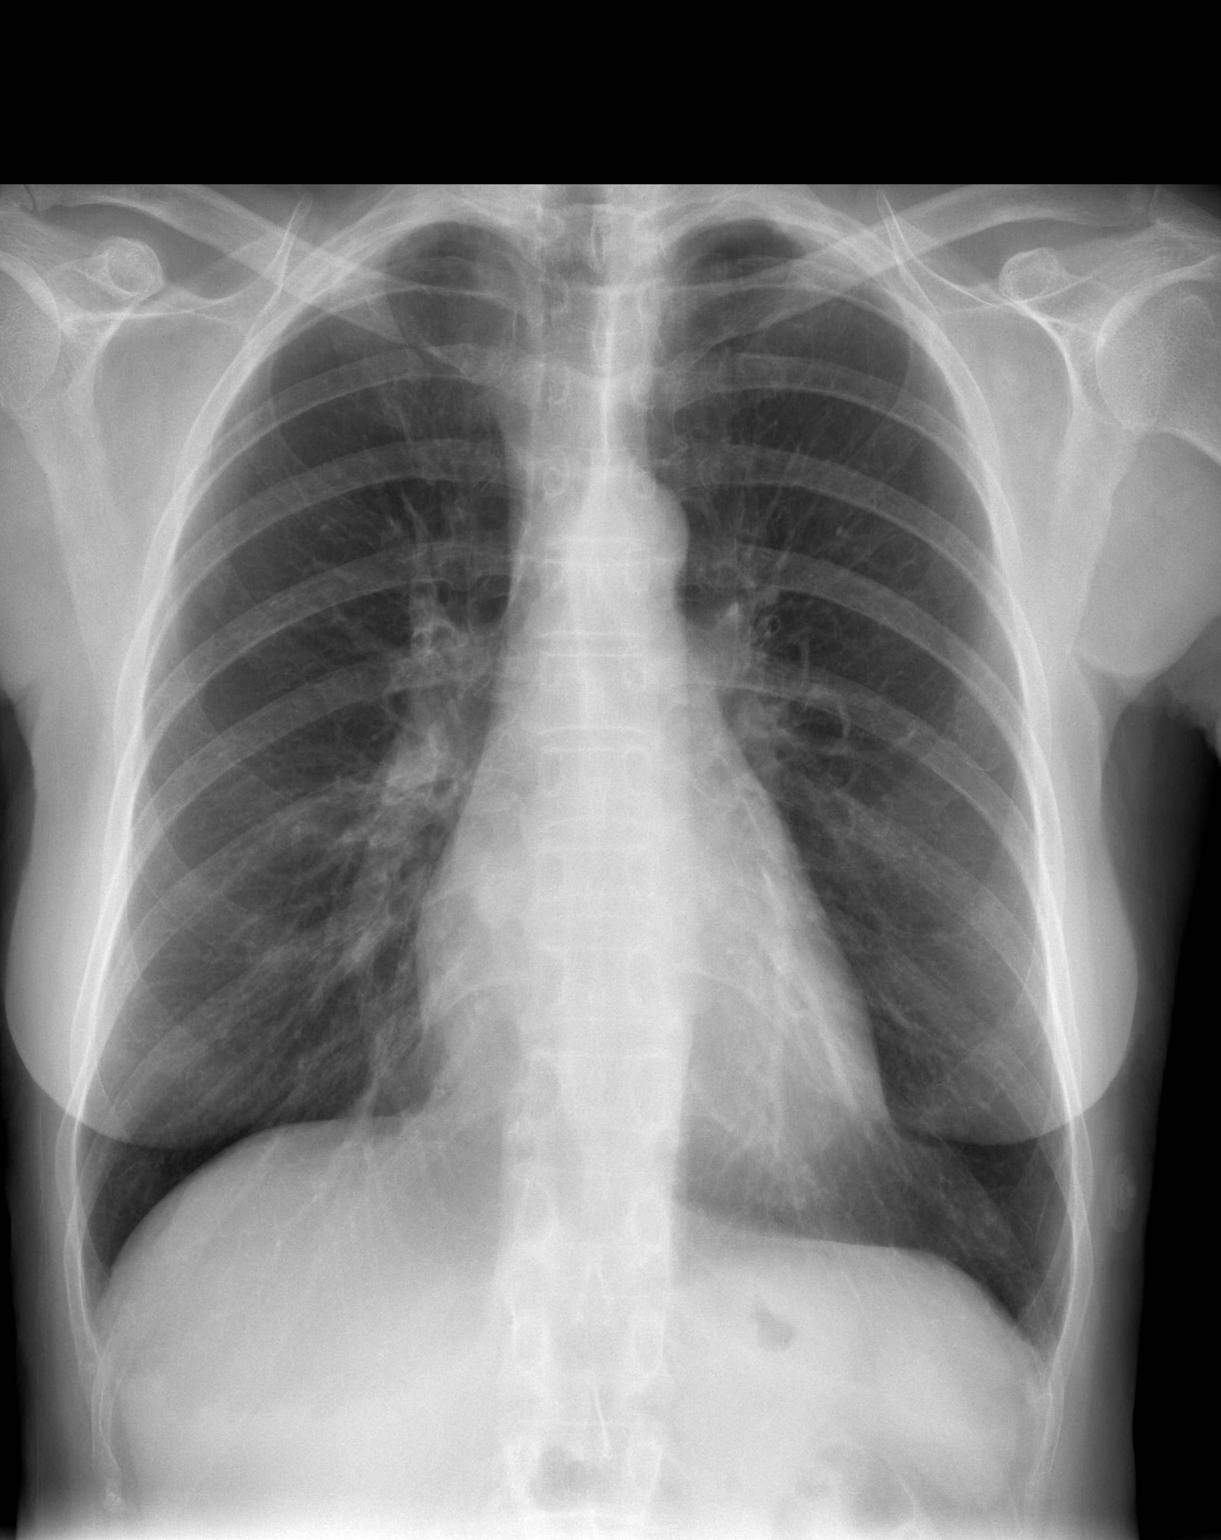

[w chest lat]
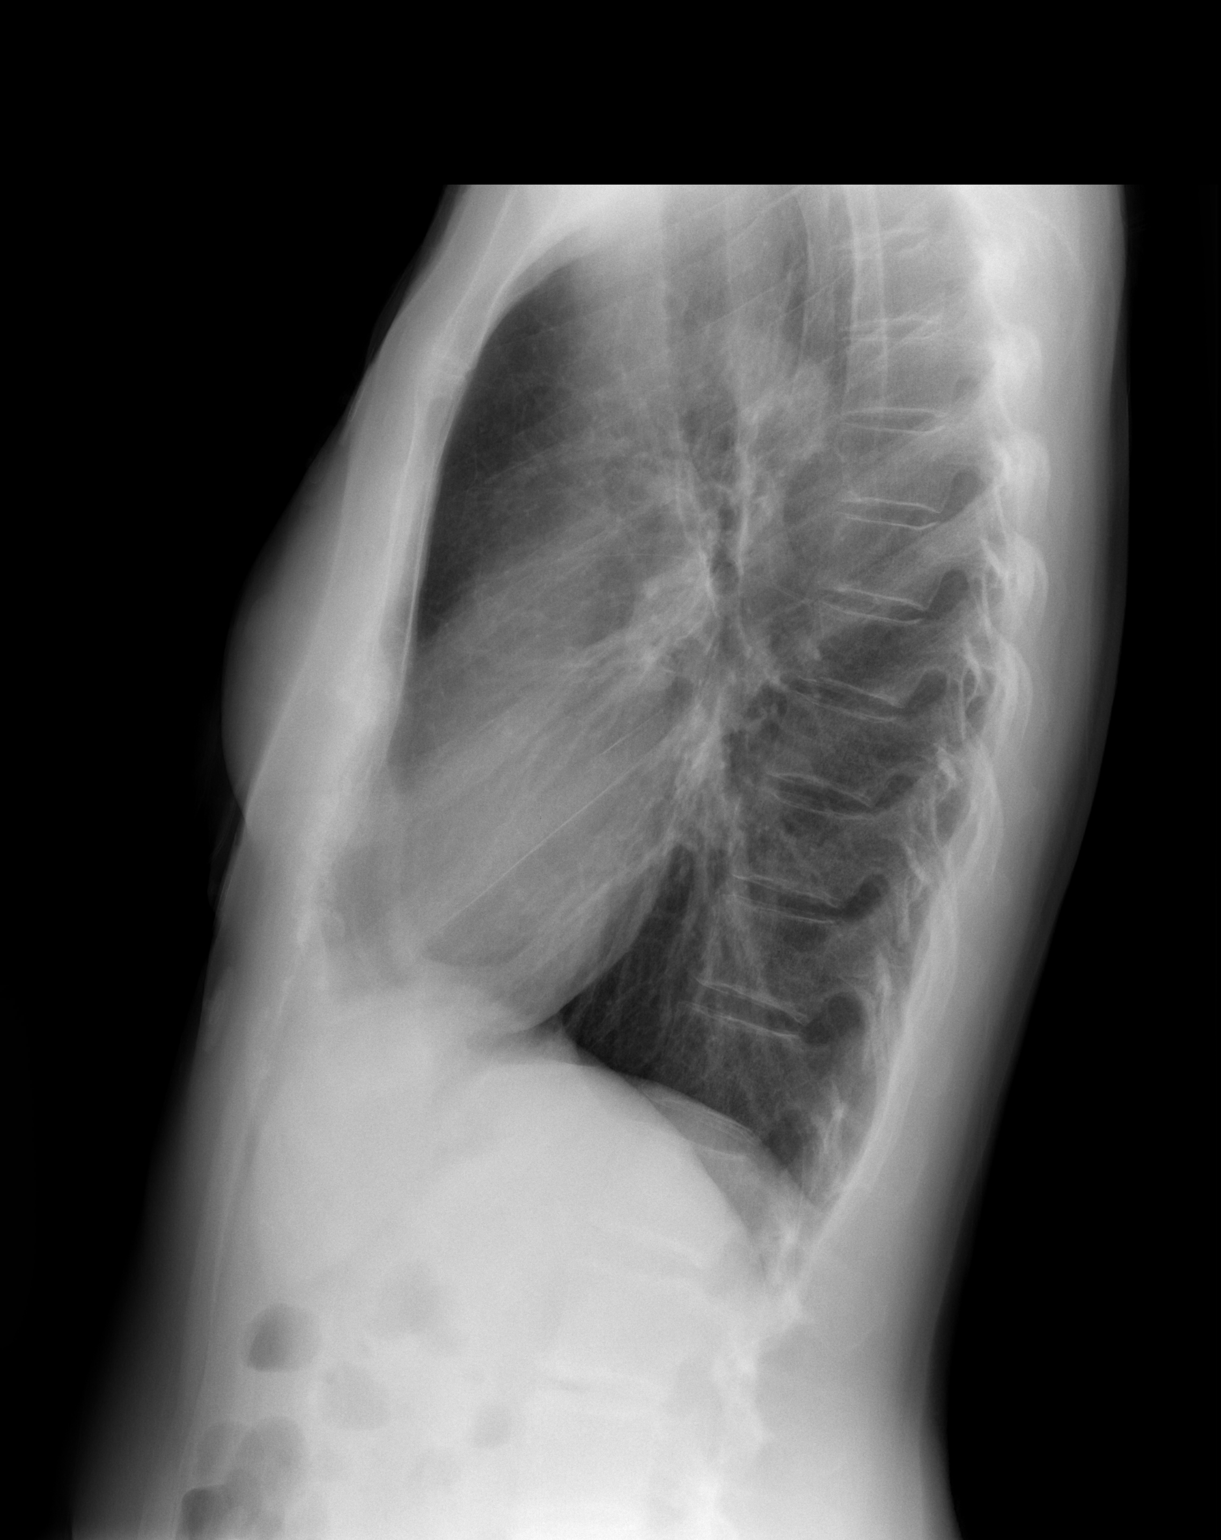

[2 of 2 positions shown; findings below may reference images not displayed]

FINDINGS: The heart size and mediastinal contours are within normal
limits.  Both lungs are clear.  The visualized skeletal structures
are unremarkable.
IMPRESSION: No active disease.

## 2011-05-01 MED ORDER — NITROGLYCERIN 0.4 MG SL SUBL: 0.4000 mg | SUBLINGUAL_TABLET | SUBLINGUAL | Status: DC | PRN

## 2011-05-01 MED ORDER — ACETAMINOPHEN 325 MG PO TABS: 650.0000 mg | ORAL_TABLET | ORAL | Status: DC | PRN

## 2011-05-01 MED ORDER — ATENOLOL 25 MG PO TABS
25.0000 mg | ORAL_TABLET | Freq: Every day | ORAL | Status: DC
  Administered 2011-05-02 – 2011-05-03 (×2): 25 mg via ORAL
  Filled 2011-05-01 (×3): qty 1

## 2011-05-01 MED ORDER — B COMPLEX-C PO TABS
1.0000 | ORAL_TABLET | Freq: Every day | ORAL | Status: DC
  Administered 2011-05-02 – 2011-05-03 (×2): 1 via ORAL
  Filled 2011-05-01 (×3): qty 1

## 2011-05-01 MED ORDER — HEPARIN BOLUS VIA INFUSION
2000.0000 [IU] | Freq: Once | INTRAVENOUS | Status: AC
  Administered 2011-05-01: 2000 [IU] via INTRAVENOUS
  Filled 2011-05-01: qty 2000

## 2011-05-01 MED ORDER — METOPROLOL TARTRATE 1 MG/ML IV SOLN
5.0000 mg | Freq: Once | INTRAVENOUS | Status: DC
  Filled 2011-05-01: qty 5

## 2011-05-01 MED ORDER — WARFARIN - PHARMACIST DOSING INPATIENT: Freq: Every day | Status: DC

## 2011-05-01 MED ORDER — ASPIRIN EC 81 MG PO TBEC
81.0000 mg | DELAYED_RELEASE_TABLET | Freq: Every day | ORAL | Status: DC
  Filled 2011-05-01: qty 1

## 2011-05-01 MED ORDER — ONDANSETRON HCL 4 MG/2ML IJ SOLN: 4.0000 mg | Freq: Four times a day (QID) | INTRAMUSCULAR | Status: DC | PRN

## 2011-05-01 MED ORDER — DILTIAZEM HCL ER COATED BEADS 240 MG PO CP24
240.0000 mg | ORAL_CAPSULE | Freq: Every day | ORAL | Status: DC
  Administered 2011-05-02 – 2011-05-03 (×2): 240 mg via ORAL
  Filled 2011-05-01 (×2): qty 1

## 2011-05-01 MED ORDER — VITAMIN D3 25 MCG (1000 UNIT) PO TABS
1000.0000 [IU] | ORAL_TABLET | Freq: Every day | ORAL | Status: DC
  Administered 2011-05-02 – 2011-05-03 (×2): 1000 [IU] via ORAL
  Filled 2011-05-01 (×2): qty 1

## 2011-05-01 MED ORDER — SODIUM CHLORIDE 0.45 % IV SOLN
INTRAVENOUS | Status: DC
  Administered 2011-05-01 – 2011-05-02 (×2): via INTRAVENOUS

## 2011-05-01 MED ORDER — VITAMIN D 1000 UNITS PO CAPS: 1000.0000 [IU] | ORAL_CAPSULE | Freq: Every day | ORAL | Status: DC

## 2011-05-01 MED ORDER — HEPARIN (PORCINE) IN NACL 100-0.45 UNIT/ML-% IJ SOLN
750.0000 [IU]/h | INTRAMUSCULAR | Status: DC
  Administered 2011-05-01 – 2011-05-03 (×2): 750 [IU]/h via INTRAVENOUS
  Filled 2011-05-01 (×3): qty 250

## 2011-05-01 MED ORDER — WARFARIN SODIUM 2.5 MG PO TABS
2.5000 mg | ORAL_TABLET | Freq: Once | ORAL | Status: AC
  Administered 2011-05-01: 2.5 mg via ORAL
  Filled 2011-05-01: qty 1

## 2011-05-01 MED ORDER — ZOLPIDEM TARTRATE 5 MG PO TABS
10.0000 mg | ORAL_TABLET | Freq: Every day | ORAL | Status: DC
  Administered 2011-05-01 – 2011-05-02 (×2): 10 mg via ORAL
  Filled 2011-05-01 (×2): qty 2

## 2011-05-01 NOTE — ED Notes (Addendum)
Hulan Fess, RN withholding metoprolol due to BP 95/58. Notified MD.

## 2011-05-01 NOTE — ED Notes (Signed)
Report called to unit, pt transported on cardiac monitor.

## 2011-05-01 NOTE — Progress Notes (Signed)
ANTICOAGULATION CONSULT NOTE - Initial Consult  Pharmacy Consult for Heparin/Coumadin Indication: atrial fibrillation  Allergies  Allergen Reactions  . Novocain     Rapid heart rate    Patient Measurements:   Heparin Dosing Weight: 57 kg  Vital Signs: Temp: 98.3 F (36.8 C) (04/06 1056) Temp src: Oral (04/06 1056) BP: 96/52 mmHg (04/06 1451) Pulse Rate: 107  (04/06 1451)  Labs:  Basename 05/01/11 1150 05/01/11 1113 05/01/11 1058  HGB 16.0* -- 15.0  HCT 47.0* -- 43.6  PLT -- -- 173  APTT -- -- --  LABPROT -- 17.9* --  INR -- 1.45 --  HEPARINUNFRC -- -- --  CREATININE 0.80 0.69 --  CKTOTAL -- -- --  CKMB -- -- --  TROPONINI -- <0.30 --   The CrCl is unknown because both a height and weight (above a minimum accepted value) are required for this calculation.  Medical History: Past Medical History  Diagnosis Date  . TIA (transient ischemic attack)   . PAF (paroxysmal atrial fibrillation)     has been on Flecainide in the past. Stopped 02/10/11; Remains on coumadin anticoagulation  . HTN (hypertension)   . Gross hematuria   . Dysphagia   . Endometriosis   . Osteopenia   . Insomnia   . Chronic anticoagulation     on coumadin  . Dizziness     improved with Meclizine    Medications:  Scheduled:    . aspirin EC  81 mg Oral Daily  . atenolol  25 mg Oral Daily  . B-complex with vitamin C  1 tablet Oral Daily  . diltiazem  240 mg Oral Daily  . Vitamin D  1,000 Units Oral Daily  . zolpidem  10 mg Oral QHS  . DISCONTD: metoprolol  5 mg Intravenous Once   Home warfarin dose: 2.5mg  daily except 5mg  on Mon/Fri.  Pt states this was decreased by 2.5mg  weekly about 2 weeks ago for supra-therapeutic INR. Pt reports compliance with warfarin and diet.  Assessment: Pt on chronic warfarin for PAF presents to hospital today with symptomatic Afib. INR sub-therapeutic on admission. Plan to bridge with heparin until INR>2. No bleeding noted. Pt has already taken 2.5mg  warfarin  today.  Goal of Therapy:  INR 2-3 Heparin level 0.3-0.7 units/ml   Plan:  1) Give additional 2.5mg  warfarin today 2) Initial heparin 2000 unit IV bolus then start infusion at 750 units/hr. 3) Check 8hr heparin level 4) Check daily heparin level, CBC, and INR.  Elson Clan 05/01/2011,4:01 PM

## 2011-05-01 NOTE — ED Provider Notes (Addendum)
Patient has been screened and stretcher triage today.  Patient has a known history of paroxysmal atrial fibrillation.  Her EKG here initially showed a normal sinus rhythm but on the monitor patient is intermittently in A. fib here this morning.  Patient has some mild chest tightness associated with this but no difficulty breathing.  Patient has had appropriate laboratory studies sent and chest x-ray ordered.  Her cardiologist is Dr. Graciela Husbands patient to be transferred to a module for further workup and evaluation and consultation with cardiology.  Nat Christen, MD 05/01/11 1132  Is there is no rooms available in the back prior to patient's results returning I will be consulting cardiology from stretcher triage.  Chief complaint: Palpitations. History of present illness: Patient presents with intermittent feelings of palpitations since she woke up this morning.  Patient notes she felt well yesterday.  This morning she woke up and just felt really tired and intermittently has noted that her heart rate has been abnormal.  Patient does have a known history of paroxysmal atrial fibrillation.  She does take Coumadin for this.  She notes she's noticed no medications.  She does not have any inciting factors.  No increased caffeine usage.  Patient has mild chest tightness but no shortness of breath, nausea, vomiting or fevers.  No cough or other illness.  Review of systems: As per history of present illness and all other systems are reviewed and negative. Physical exam: Constitutional: Patient appears well lying on the bed. HEENT: Normocephalic and atraumatic.  Pupils are equal round reactive to light.  Extraocular movements are intact.  Face is symmetric.   Neck: Trachea is midline.  No cervical adenopathy. Cardiac: Patient is regularly irregular with no murmurs, rubs or gallops noted on exam. Pulmonary: Clear to auscultation bilaterally with no wheezing. Abdomen: Soft, nontender, nondistended.  neuro:  Normal speech no focal weakness or numbness on exam.  Face is symmetric. Muscle skeletal: No lower extremity edema or tenderness. Skin: No rashes or lesions. Psychiatric: Normal mood and affect  Results for orders placed during the hospital encounter of 05/01/11  CBC      Component Value Range   WBC 5.3  4.0 - 10.5 (K/uL)   RBC 4.53  3.87 - 5.11 (MIL/uL)   Hemoglobin 15.0  12.0 - 15.0 (g/dL)   HCT 45.4  09.8 - 11.9 (%)   MCV 96.2  78.0 - 100.0 (fL)   MCH 33.1  26.0 - 34.0 (pg)   MCHC 34.4  30.0 - 36.0 (g/dL)   RDW 14.7  82.9 - 56.2 (%)   Platelets 173  150 - 400 (K/uL)  BASIC METABOLIC PANEL      Component Value Range   Sodium 134 (*) 135 - 145 (mEq/L)   Potassium 4.7  3.5 - 5.1 (mEq/L)   Chloride 99  96 - 112 (mEq/L)   CO2 23  19 - 32 (mEq/L)   Glucose, Bld 101 (*) 70 - 99 (mg/dL)   BUN 13  6 - 23 (mg/dL)   Creatinine, Ser 1.30  0.50 - 1.10 (mg/dL)   Calcium 9.0  8.4 - 86.5 (mg/dL)   GFR calc non Af Amer 84 (*) >90 (mL/min)   GFR calc Af Amer >90  >90 (mL/min)  URINALYSIS, ROUTINE W REFLEX MICROSCOPIC      Component Value Range   Color, Urine YELLOW  YELLOW    APPearance CLEAR  CLEAR    Specific Gravity, Urine 1.020  1.005 - 1.030    pH 7.0  5.0 - 8.0    Glucose, UA NEGATIVE  NEGATIVE (mg/dL)   Hgb urine dipstick NEGATIVE  NEGATIVE    Bilirubin Urine NEGATIVE  NEGATIVE    Ketones, ur 40 (*) NEGATIVE (mg/dL)   Protein, ur NEGATIVE  NEGATIVE (mg/dL)   Urobilinogen, UA 0.2  0.0 - 1.0 (mg/dL)   Nitrite NEGATIVE  NEGATIVE    Leukocytes, UA NEGATIVE  NEGATIVE   PROTIME-INR      Component Value Range   Prothrombin Time 17.9 (*) 11.6 - 15.2 (seconds)   INR 1.45  0.00 - 1.49   TROPONIN I      Component Value Range   Troponin I <0.30  <0.30 (ng/mL)  POCT I-STAT, CHEM 8      Component Value Range   Sodium 138  135 - 145 (mEq/L)   Potassium 5.0  3.5 - 5.1 (mEq/L)   Chloride 104  96 - 112 (mEq/L)   BUN 14  6 - 23 (mg/dL)   Creatinine, Ser 2.13  0.50 - 1.10 (mg/dL)    Glucose, Bld 086 (*) 70 - 99 (mg/dL)   Calcium, Ion 5.78  4.69 - 1.32 (mmol/L)   TCO2 25  0 - 100 (mmol/L)   Hemoglobin 16.0 (*) 12.0 - 15.0 (g/dL)   HCT 62.9 (*) 52.8 - 46.0 (%)  POCT I-STAT TROPONIN I      Component Value Range   Troponin i, poc 0.01  0.00 - 0.08 (ng/mL)   Comment 3            Dg Chest 2 View  05/01/2011  *RADIOLOGY REPORT*  Clinical Data:  Midsternal chest pain and atrial fibrillation.  CHEST - 2 VIEW  Comparison: 06/13/2010  Findings: The heart size and mediastinal contours are within normal limits.  Both lungs are clear.  The visualized skeletal structures are unremarkable.  IMPRESSION: No active disease.  Original Report Authenticated By: Reola Calkins, M.D.    Date: 05/01/2011  Rate: 69  Rhythm: normal sinus rhythm  QRS Axis: normal  Intervals: normal  ST/T Wave abnormalities: normal  Conduction Disutrbances:none  Narrative Interpretation:   Old EKG Reviewed: unchanged from 01/14/2011  Despite patient's normal EKG initially on the monitor here she has intermittently been in atrial fibrillation with heart rates up to the 120s.  I'm going to contact cardiology for further recommendations on this patient as she otherwise has normal laboratory studies and no specific inciting factors for her A. fib today.       Nat Christen, MD 05/01/11 1257  I have discussed this patient with Dr. Mayford Knife from cardiology and she will see and evaluate the patient.  Nat Christen, MD 05/01/11 1320

## 2011-05-01 NOTE — ED Notes (Signed)
Arm band placed on patient, tech applying monitor, lab at the bedside

## 2011-05-01 NOTE — ED Notes (Signed)
Cardiology at the bedside. Pt remains on cardiac monitor. Vital signs stable. Denies chest pain. Family remains at bedside.

## 2011-05-01 NOTE — ED Notes (Signed)
To ed for eval after feeling like she has been in afib since yesterday. Hx of same. States she woke this am and felt very tired. No sob. C/o left chest tightness.

## 2011-05-01 NOTE — ED Notes (Signed)
Pt presents to department for evaluation of midsternal chest pressure and afib. Onset last night while at home playing chess. Now states she feels like her heart is "jumping around." does take cardizem and coumadin. 6/10 chest pressure at the time. Denies SOB. Respirations unlabored. Lung sounds clear and equal bilaterally. She is conscious alert and oriented x4. Skin warm and dry. No signs of distress noted at the time.

## 2011-05-01 NOTE — Telephone Encounter (Signed)
Pt called because she was back in atrial fib. She may have been in it last pm, but was definitely in it this am. She took her morning meds. She is symptomatic and feels bad. Her SBP at one point was 88 and before her am meds, her HR was 166.   Discussed options, pt feels she is not tolerating this well and needs to come in. Advised her not to drive.

## 2011-05-01 NOTE — H&P (Signed)
Admit date: 05/01/2011 Referring Physician:  Dr. Golda Acre Primary Cardiologist:  Dr. Arthur Holms Chief complaint/reason for admission:atrial fibrillation with RVR  HPI: This is a 75yo WF with a history of PAF, HTN, TIA and chronic systemic anticoagulation who presented to the ER with compliants of palpitations.  This started last PM but this am was much more pronounced.  When she got up this am she was very fatigued.  She noticed some mild chest tightness this am that is intermittent and lasts a few seconds at a time.  She denies any SOB or nausea but had some mild diaphoresis this am.  In the ER she was found to be in atrial fibrillation with RVR.  She was noted to go in and out of PAF in the ER.  BP was low at 89/53mmHg.  She says that she has 2 glasses of wine nightly.  Apparently she had been on Flecainidein the past but she thinks this was stopped due to dizziness. In January she was switched from propranolol to Atenolol due to dizziness with propranolol.    PMH:    Past Medical History  Diagnosis Date  . TIA (transient ischemic attack)   . PAF (paroxysmal atrial fibrillation)     has been on Flecainide in the past. Stopped 02/10/11; Remains on coumadin anticoagulation  . HTN (hypertension)   . Gross hematuria   . Dysphagia   . Endometriosis   . Osteopenia   . Insomnia   . Chronic anticoagulation     on coumadin  . Dizziness     improved with Meclizine    PSH:    Past Surgical History  Procedure Date  . Polypectomy   . Total hysterectomy and bilateral salpingoopherectomy   . Appendectomy   . Skin cancer removal   . Cardioversion 01/14/2011    Procedure: CARDIOVERSION;  Surgeon: Duke Salvia, MD;  Location: [facility] OR;  Service: Cardiovascular;  Laterality: N/A;  To be completed in Neuro OR 33 time slot 0830 12/20    ALLERGIES:   Novocain  Prior to Admit Meds:   (Not in a hospital admission) Family HX:   History reviewed. No pertinent family history. Social HX:    History   Social  History  . Marital Status: Married    Spouse Name: N/A    Number of Children: N/A  . Years of Education: N/A   Occupational History  . Not on file.   Social History Main Topics  . Smoking status: Never Smoker   . Smokeless tobacco: Not on file  . Alcohol Use: 8.4 oz/week    14 Glasses of wine per week     1-2 glasses of wine nightly  . Drug Use: No  . Sexually Active: Not Currently   Other Topics Concern  . 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     ROS:  All 11 ROS were addressed and are negative except what is stated in the HPI  PHYSICAL EXAM Filed Vitals:   05/01/11 1322  BP: 105/66  Pulse: 125  Temp:   Resp: 21   General: Well developed, well nourished, in no acute distress Head: Eyes PERRLA, No xanthomas.   Normal cephalic and atramatic  Lungs:   Clear bilaterally to auscultation and percussion. Heart:   Irregularly irregular S1 S2  Pulses are 2+ & equal. Abdomen: Bowel sounds are positive, abdomen soft and non-tender without masses Extremities:   No clubbing, cyanosis or edema.  DP +1 Neuro: Alert and oriented X 3. Psych:  Good affect, responds appropriately   Labs:   Lab Results  Component Value Date   WBC 5.3 05/01/2011   HGB 16.0* 05/01/2011   HCT 47.0* 05/01/2011   MCV 96.2 05/01/2011   PLT 173 05/01/2011    Lab 05/01/11 1150 05/01/11 1113  NA 138 --  K 5.0 --  CL 104 --  CO2 -- 23  BUN 14 --  CREATININE 0.80 --  CALCIUM -- 9.0  PROT -- --  BILITOT -- --  ALKPHOS -- --  ALT -- --  AST -- --  GLUCOSE 105* --   Lab Results  Component Value Date   CKTOTAL 99 12/22/2010   CKMB 3.6 12/22/2010   TROPONINI <0.30 05/01/2011   No results found for this basename: PTT   Lab Results  Component Value Date   INR 1.45 05/01/2011   INR 2.2 04/15/2011   INR  3.6 03/25/2011   PROTIME 17.1 07/09/2008         Radiology:  *RADIOLOGY REPORT*  Clinical Data: Midsternal chest pain and atrial fibrillation.  CHEST - 2 VIEW  Comparison: 06/13/2010  Findings: The heart size and mediastinal contours are within normal  limits. Both lungs are clear. The visualized skeletal structures  are unremarkable.  IMPRESSION:  No active disease.  Original Report Authenticated By: Reola Calkins, M.D.   EKG:  NSR with runs of PAF  ASSESSMENT:  1.  PAF - patient is going in and out of afib in ER and is symptomatic from it 2.  Hypotension 3.  Chest pressure in setting of PAF 4.  Systemic anticoagulation with subtherapeutic INR  PLAN:   1.  Admit to tele bed 2.  Cycle cardiac enzymes 3.  IVF hydration due to low BP 4.  IV Heparin gtt per pharmacy until INR > 2 5.  Coumadin per pharmacy protocol 6.  Continue Atenolol as BP tolerates (patient took Atenolol this am) 7.  Per Dr. Odessa Fleming OV note in January, he was going to get another echo to determine whether she would be appropriate for atrial fibrillation ablation and also consider initiating Rhythmol if she had reoccurrence of afib - 2D echo showed normal LVF with EF 55-60% with mild LAE  8.  Outpatient sleep study should be considered given patient's recurrent PAF with history of snoring and awakenings due to gasping for air 9.  Further work-up per Dr. Cipriano Bunker, MD  05/01/2011  1:54 PM

## 2011-05-02 DIAGNOSIS — I4891 Unspecified atrial fibrillation: Principal | ICD-10-CM

## 2011-05-02 LAB — CARDIAC PANEL(CRET KIN+CKTOT+MB+TROPI)
Relative Index: INVALID (ref 0.0–2.5)
Relative Index: INVALID (ref 0.0–2.5)
Relative Index: INVALID (ref 0.0–2.5)
Total CK: 69 U/L (ref 7–177)
Total CK: 69 U/L (ref 7–177)
Troponin I: <0.3 ng/mL (ref <0.30)
Troponin I: <0.3 ng/mL (ref <0.30)

## 2011-05-02 LAB — HEPARIN LEVEL (UNFRACTIONATED): Heparin Unfractionated: 0.57 IU/mL (ref 0.30–0.70)

## 2011-05-02 LAB — CBC
HCT: 40.2 % (ref 36.0–46.0)
HCT: 42.6 % (ref 36.0–46.0)
Hemoglobin: 13.8 g/dL (ref 12.0–15.0)
Hemoglobin: 14.5 g/dL (ref 12.0–15.0)
MCH: 32.6 pg (ref 26.0–34.0)
MCHC: 34 g/dL (ref 30.0–36.0)
MCHC: 34.3 g/dL (ref 30.0–36.0)
RDW: 13.3 % (ref 11.5–15.5)
WBC: 3.7 10*3/uL — ABNORMAL LOW (ref 4.0–10.5)

## 2011-05-02 LAB — DIFFERENTIAL
Basophils Absolute: 0 10*3/uL (ref 0.0–0.1)
Basophils Relative: 1 % (ref 0–1)
Eosinophils Absolute: 0.1 10*3/uL (ref 0.0–0.7)
Lymphocytes Relative: 59 % — ABNORMAL HIGH (ref 12–46)
Monocytes Absolute: 0.4 10*3/uL (ref 0.1–1.0)
Neutro Abs: 1 10*3/uL — ABNORMAL LOW (ref 1.7–7.7)
Neutrophils Relative %: 26 % — ABNORMAL LOW (ref 43–77)

## 2011-05-02 LAB — PROTIME-INR: INR: 1.48 (ref 0.00–1.49)

## 2011-05-02 LAB — TSH: TSH: 1.196 u[IU]/mL (ref 0.350–4.500)

## 2011-05-02 MED ORDER — WARFARIN SODIUM 5 MG PO TABS
5.0000 mg | ORAL_TABLET | Freq: Once | ORAL | Status: AC
  Administered 2011-05-02: 5 mg via ORAL
  Filled 2011-05-02: qty 1

## 2011-05-02 MED ORDER — PROPAFENONE HCL 225 MG PO TABS
225.0000 mg | ORAL_TABLET | Freq: Two times a day (BID) | ORAL | Status: DC
  Administered 2011-05-02 – 2011-05-03 (×3): 225 mg via ORAL
  Filled 2011-05-02 (×4): qty 1

## 2011-05-02 NOTE — Progress Notes (Signed)
Pt states her skin is thin and fragile and would prefer staff to use paper tape when possible.

## 2011-05-02 NOTE — Progress Notes (Signed)
ANTICOAGULATION CONSULT NOTE - Initial Consult  Pharmacy Consult for Heparin/Coumadin Indication: atrial fibrillation  Allergies  Allergen Reactions  . Novocain     Rapid heart rate    Patient Measurements: Weight: 124 lb 9 oz (56.5 kg) Heparin Dosing Weight: 57 kg  Vital Signs: Temp: 98.9 F (37.2 C) (04/07 0556) Temp src: Oral (04/07 0556) BP: 112/64 mmHg (04/07 1017) Pulse Rate: 121  (04/07 1017)  Labs:  Basename 05/02/11 1140 05/02/11 0550 05/02/11 0001 05/02/11 05/01/11 1150 05/01/11 1113 05/01/11 1058  HGB -- 14.5 -- 13.8 -- -- --  HCT -- 42.6 -- 40.2 47.0* -- --  PLT -- 166 -- 152 -- -- 173  APTT -- -- -- -- -- -- --  LABPROT -- 18.2* -- -- -- 17.9* --  INR -- 1.48 -- -- -- 1.45 --  HEPARINUNFRC -- 0.57 0.48 -- -- -- --  CREATININE -- -- -- -- 0.80 0.69 --  CKTOTAL 69 69 -- 68 -- -- --  CKMB 2.3 2.0 -- 2.0 -- -- --  TROPONINI <0.30 <0.30 -- <0.30 -- -- --   The CrCl is unknown because both a height and weight (above a minimum accepted value) are required for this calculation.  Medical History: Past Medical History  Diagnosis Date  . TIA (transient ischemic attack)   . PAF (paroxysmal atrial fibrillation)     has been on Flecainide in the past. Stopped 02/10/11; Remains on coumadin anticoagulation  . HTN (hypertension)   . Gross hematuria   . Dysphagia   . Endometriosis   . Osteopenia   . Insomnia   . Chronic anticoagulation     on coumadin  . Dizziness     improved with Meclizine    Medications:  Scheduled:     . atenolol  25 mg Oral Daily  . B-complex with vitamin C  1 tablet Oral Daily  . cholecalciferol  1,000 Units Oral Daily  . diltiazem  240 mg Oral Daily  . heparin  2,000 Units Intravenous Once  . propafenone  225 mg Oral BID  . warfarin  2.5 mg Oral ONCE-1800  . Warfarin - Pharmacist Dosing Inpatient   Does not apply q1800  . zolpidem  10 mg Oral QHS  . DISCONTD: aspirin EC  81 mg Oral Daily  . DISCONTD: metoprolol  5 mg Intravenous  Once  . DISCONTD: Vitamin D  1,000 Units Oral Daily   Home warfarin dose: 2.5mg  daily except 5mg  on Mon/Fri.  Pt states this was decreased by 2.5mg  weekly about 2 weeks ago for supra-therapeutic INR. Pt reports compliance with warfarin and diet.  Assessment: Pt on chronic warfarin for PAF presents to hospital with symptomatic Afib. INR sub-therapeutic on admission. Plan to bridge with heparin until INR>2. No bleeding noted. INR rising to goal.  Goal of Therapy:  INR 2-3 Heparin level 0.3-0.7 units/ml   Plan:  1) Repeat warfarin 5mg  po x 1 today 2) Continue heparin infusion at 750 units/hr. 3) Check daily heparin level, CBC, and INR.  Elson Clan 05/02/2011,1:21 PM

## 2011-05-02 NOTE — Progress Notes (Signed)
ANTICOAGULATION CONSULT NOTE - Initial Consult  Pharmacy Consult for Heparin/Coumadin Indication: atrial fibrillation  Allergies  Allergen Reactions  . Novocain     Rapid heart rate    Patient Measurements: Weight: 124 lb 9 oz (56.5 kg) Heparin Dosing Weight: 57 kg  Vital Signs: Temp: 98.7 F (37.1 C) (04/06 1605) Temp src: Oral (04/06 1605) BP: 122/61 mmHg (04/06 1605) Pulse Rate: 83  (04/06 1605)  Labs:  Basename 05/02/11 0001 05/02/11 05/01/11 1702 05/01/11 1150 05/01/11 1113 05/01/11 1058  HGB -- 13.8 -- 16.0* -- --  HCT -- 40.2 -- 47.0* -- 43.6  PLT -- 152 -- -- -- 173  APTT -- -- -- -- -- --  LABPROT -- -- -- -- 17.9* --  INR -- -- -- -- 1.45 --  HEPARINUNFRC 0.48 -- -- -- -- --  CREATININE -- -- -- 0.80 0.69 --  CKTOTAL -- -- 77 -- -- --  CKMB -- -- 1.9 -- -- --  TROPONINI -- -- <0.30 -- <0.30 --   The CrCl is unknown because both a height and weight (above a minimum accepted value) are required for this calculation.  Assessment: 76 yo female with Afib for anticoagulation  Goal of Therapy:  INR 2-3 Heparin level 0.3-0.7 units/ml   Plan:  Continue Heparin at current rate  Follow-up am labs.  Eddie Candle 05/02/2011,12:56 AM

## 2011-05-02 NOTE — Progress Notes (Signed)
  Patient Name: Selena Taylor      SUBJECTIVE: With a history of PAF previously treated with flecainide SHe was admitted last night with paroxysms of rapid atrial fibrillation associated with some hypotension. These were associated with palpitations.  There was a very stressufl family interaction the night before all this started; this has been a trigger for her atrial fibrillation in the past.  She denies any recent medication changes. Her alcohol intake is stable. She has noted no change in the color of her stool or exercise tolerance. Past Medical History  Diagnosis Date  . TIA (transient ischemic attack)   . PAF (paroxysmal atrial fibrillation)     has been on Flecainide in the past. Stopped 02/10/11; Remains on coumadin anticoagulation  . HTN (hypertension)   . Gross hematuria   . Dysphagia   . Endometriosis   . Osteopenia   . Insomnia   . Chronic anticoagulation     on coumadin  . Dizziness     improved with Meclizine    PHYSICAL EXAM Filed Vitals:   05/01/11 1605 05/01/11 2200 05/02/11 0556 05/02/11 1017  BP: 122/61 119/69 97/68 112/64  Pulse: 83 56 63 121  Temp: 98.7 F (37.1 C) 98.1 F (36.7 C) 98.9 F (37.2 C)   TempSrc: Oral  Oral   Resp: 18 20 20    Weight: 124 lb 9 oz (56.5 kg)     SpO2: 96% 95% 96%     Well developed and nourished in no acute distress HENT normal Neck supple with JVP-flat Carotids brisk and full without bruits Clear Irregularly irregular rate and rhythm with rapid ventricular response, no murmurs or gallops Abd-soft with active BS without hepatomegaly No Clubbing cyanosis edema Skin-warm and dry A & Oriented  Grossly normal sensory and motor function   TELEMETRY: Reviewed telemetry pt in rapid atrial fibrillation  No intake or output data in the 24 hours ending 05/02/11 1037  LABS: Basic Metabolic Panel:  Lab 05/01/11 9562 05/01/11 1113  NA 138 134*  K 5.0 4.7  CL 104 99  CO2 -- 23  GLUCOSE 105* 101*  BUN 14 13  CREATININE  0.80 0.69  CALCIUM -- 9.0  MG -- --  PHOS -- --   Cardiac Enzymes:  Basename 05/02/11 0550 05/02/11 05/01/11 1702  CKTOTAL 69 68 77  CKMB 2.0 2.0 1.9  CKMBINDEX -- -- --  TROPONINI <0.30 <0.30 <0.30   CBC:  Lab 05/02/11 0550 05/02/11 05/01/11 1150 05/01/11 1058  WBC 3.7* 3.7* -- 5.3  NEUTROABS -- 1.0* -- --  HGB 14.5 13.8 16.0* 15.0  HCT 42.6 40.2 47.0* 43.6  MCV 96.4 95.0 -- 96.2  PLT 166 152 -- 173   PROTIME:  Basename 05/02/11 0550 05/01/11 1113  LABPROT 18.2* 17.9*  INR 1.48 1.45     ASSESSMENT AND PLAN:  Patient Active Hospital Problem List: PAROXYSMAL ATRIAL FIBRILLATION (11/03/2006)  she has recurrent paroxysms of atrial fibrillation. We will begin her on Rythmol 225 twice a day. We'll also check a TSH to see if there is underlying metabolic trigger for the frequent episodes is recently commencing.  Her INR was subtherapeutic. Her thromboembolic risk profile is quite high with a prior TIA; we have discussed alternative agents to Coumadin. She is currently on heparin so we will not make a transition here in hospital.  Will dc asa   Signed, Sherryl Manges MD  05/02/2011

## 2011-05-03 ENCOUNTER — Other Ambulatory Visit: Payer: Self-pay

## 2011-05-03 LAB — CBC
Hemoglobin: 12.7 g/dL (ref 12.0–15.0)
Platelets: 144 10*3/uL — ABNORMAL LOW (ref 150–400)
RBC: 3.88 MIL/uL (ref 3.87–5.11)
WBC: 3.8 10*3/uL — ABNORMAL LOW (ref 4.0–10.5)

## 2011-05-03 LAB — PATHOLOGIST SMEAR REVIEW

## 2011-05-03 LAB — PROTIME-INR
INR: 2.07 — ABNORMAL HIGH (ref 0.00–1.49)
Prothrombin Time: 23.7 seconds — ABNORMAL HIGH (ref 11.6–15.2)

## 2011-05-03 MED ORDER — OFF THE BEAT BOOK
Freq: Once | Status: AC
  Administered 2011-05-03: 06:00:00
  Filled 2011-05-03: qty 1

## 2011-05-03 MED ORDER — PROPAFENONE HCL 225 MG PO TABS: 225.0000 mg | ORAL_TABLET | Freq: Two times a day (BID) | ORAL | Status: DC

## 2011-05-03 MED ORDER — WARFARIN SODIUM 5 MG PO TABS
5.0000 mg | ORAL_TABLET | Freq: Once | ORAL | Status: DC
  Filled 2011-05-03: qty 1

## 2011-05-03 NOTE — Progress Notes (Addendum)
Patient Name: Selena Taylor      SUBJECTIVE: With a history of PAF previously treated with flecainide SHe was admitted sat night with paroxysms of rapid atrial fibrillation associated with some hypotension. These were associated with palpitations.  There was a very stressufl family interaction the night before all this started; this has been a trigger for her atrial fibrillation in the past.  She denies any recent medication changes. Her alcohol intake is stable. She has noted no change in the color of her stool or exercise tolerance. Past Medical History  Diagnosis Date  . TIA (transient ischemic attack)   . PAF (paroxysmal atrial fibrillation)     has been on Flecainide in the past. Stopped 02/10/11; Remains on coumadin anticoagulation  . HTN (hypertension)   . Gross hematuria   . Dysphagia   . Endometriosis   . Osteopenia   . Insomnia   . Chronic anticoagulation     on coumadin  . Dizziness     improved with Meclizine    PHYSICAL EXAM Filed Vitals:   05/02/11 1507 05/02/11 2148 05/03/11 0647 05/03/11 0750  BP: 114/66 113/59 110/67 112/69  Pulse: 58 55 55 61  Temp: 98.3 F (36.8 C) 98.4 F (36.9 C) 97 F (36.1 C) 98.6 F (37 C)  TempSrc: Oral Oral Oral Oral  Resp: 18 20 19 18   Weight:      SpO2: 95% 96% 93% 97%    Well developed and nourished in no acute distress HENT normal Neck supple with JVP-flat Carotids brisk and full without bruits Clear regular rate and rhythm with rapid ventricular response, no murmurs or gallops Abd-soft with active BS without hepatomegaly No Clubbing cyanosis edema Skin-warm and dry A & Oriented  Grossly normal sensory and motor function   TELEMETRY: Reviewed telemetry pt in rapid atrial fibrillation   Intake/Output Summary (Last 24 hours) at 05/03/11 0947 Last data filed at 05/03/11 0744  Gross per 24 hour  Intake   1470 ml  Output      0 ml  Net   1470 ml    LABS: Basic Metabolic Panel:  Lab 05/01/11 1610 05/01/11  1113  NA 138 134*  K 5.0 4.7  CL 104 99  CO2 -- 23  GLUCOSE 105* 101*  BUN 14 13  CREATININE 0.80 0.69  CALCIUM -- 9.0  MG -- --  PHOS -- --   Cardiac Enzymes:  Basename 05/02/11 1140 05/02/11 0550 05/02/11  CKTOTAL 69 69 68  CKMB 2.3 2.0 2.0  CKMBINDEX -- -- --  TROPONINI <0.30 <0.30 <0.30   CBC:  Lab 05/03/11 0601 05/02/11 0550 05/02/11 05/01/11 1150 05/01/11 1058  WBC 3.8* 3.7* 3.7* -- 5.3  NEUTROABS -- -- 1.0* -- --  HGB 12.7 14.5 13.8 16.0* 15.0  HCT 37.1 42.6 40.2 47.0* 43.6  MCV 95.6 96.4 95.0 -- 96.2  PLT 144* 166 152 -- 173   PROTIME:  Basename 05/03/11 0601 05/02/11 0550 05/01/11 1113  LABPROT 23.7* 18.2* 17.9*  INR 2.07* 1.48 1.45     ASSESSMENT AND PLAN:  Patient Active Hospital Problem List: PAROXYSMAL ATRIAL FIBRILLATION (11/03/2006)  she has recurrent paroxysms of atrial fibrillation. We will begin her on Rythmol 225 twice a day. We'll also check a TSH to see if there is underlying metabolic trigger for the frequent episodes is recently commencing.  Her INR was subtherapeutic. Her thromboembolic risk profile is quite high with a prior TIA; we have discussed alternative agents to Coumadin. She is currently on  heparin so we will not make a transition here in hospital.  Will dc asa  Tolerating rhyhtmol  Will discharge her on same with f/u sk in 3 weeks or so  D/c atenolol so as to avoid double betablockade  Will plan to discuss alternaitve anticoagulants in the office after she check wi her pharmacy regarding relative costs Signed, Sherryl Manges MD  05/03/2011

## 2011-05-03 NOTE — Progress Notes (Signed)
Patient given Off the Beat book for patient education. Will continue to monitor. 

## 2011-05-03 NOTE — Progress Notes (Deleted)
 CARDIOLOGY DISCHARGE SUMMARY   Patient ID: Selena Taylor MRN: [id number] DOB/AGE: 09/09/1935 76 y.o.  Admit date: 05/01/2011 Discharge date: 05/03/2011  Primary Discharge Diagnosis:  PAF Secondary Discharge Diagnosis:  Patient Active Problem List  Diagnoses  . INSOMNIA, CHRONIC  . HYPERTENSION  . PAROXYSMAL ATRIAL FIBRILLATION  . GROSS HEMATURIA  . ENDOMETRIOSIS  . OSTEOPENIA  . DYSPHAGIA  . DIARRHEA  . POLYPECTOMY, HX OF  . TIA (transient ischemic attack)  . Dizziness   Hospital Course: Ms. Tweedy is a 76-year-old female with a history of atrial fibrillation. She had palpitations and came to the hospital where she was in atrial fibrillation with rapid ventricular response. She had some problems with hypotension and was admitted for further evaluation and treatment.  She was in and out of A. fib. Her INR was subtherapeutic at 1.45. She was continued on Coumadin and started on heparin. She was initially continued on her atenolol, but was seen by Dr. Klein who reviewed the options. He felt that she did not tolerate her atrial fibrillation very well. He felt maintaining sinus rhythm was very important. She had not tolerated propanolol or flecainide in the past with dizziness and had had similar problems with a higher dose of atenolol. After reviewing the options he recommended Rythmol. Because of the Rythmol she should be off her atenolol.  By 05/03/2011, she was therapeutic on her Coumadin. She was maintaining sinus rhythm on the Rythmol. Dr. Klein noted that she had a stressful family interaction prior to her atrial fib which had been a trigger in the past. He felt her alcohol intake was stable. She was in the day without chest pain or shortness of breath and considered stable for discharge, to followup as an outpatient.  Labs:  Lab Results  Component Value Date   WBC 3.8* 05/03/2011   HGB 12.7 05/03/2011   HCT 37.1 05/03/2011   MCV 95.6 05/03/2011   PLT 144* 05/03/2011    Lab 05/01/11 1150  05/01/11 1113  NA 138 --  K 5.0 --  CL 104 --  CO2 -- 23  BUN 14 --  CREATININE 0.80 --  CALCIUM -- 9.0  PROT -- --  BILITOT -- --  ALKPHOS -- --  ALT -- --  AST -- --  GLUCOSE 105* --    Basename 05/02/11 1140 05/02/11 0550 05/02/11  CKTOTAL 69 69 68  CKMB 2.3 2.0 2.0  CKMBINDEX -- -- --  TROPONINI <0.30 <0.30 <0.30    Basename 05/03/11 0601  INR 2.07*       Radiology: Dg Chest 2 View  05/01/2011  *RADIOLOGY REPORT*  Clinical Data:  Midsternal chest pain and atrial fibrillation.  CHEST - 2 VIEW  Comparison: 06/13/2010  Findings: The heart size and mediastinal contours are within normal limits.  Both lungs are clear.  The visualized skeletal structures are unremarkable.  IMPRESSION: No active disease.  Original Report Authenticated By: GLENN T. YAMAGATA, M.D.    EKG: 03-May-2011 06:25:41 [facility] ROUTINE RECORD Suspect arm lead reversal, interpretation assumes no reversal Sinus bradycardia Lateral infarct , age undetermined ST & T wave abnormality, consider inferior ischemia Abnormal ECG 25mm/s 10mm/mV 150Hz 7.1.1 12SL 237 CID: [id number] Referred by: STEVEN C KLEIN Newly Acquired Vent. rate 52 BPM PR interval 150 ms QRS duration 84 ms QT/QTc 454/422 ms P-R-T axes 109 151 194   FOLLOW UP PLANS AND APPOINTMENTS Discharge Orders    Future Appointments: Provider: Department: Dept Phone: Center:   05/13/2011 2:00   PM [facility] [phone] None   05/24/2011 11:30 AM Steven C Klein, MD [facility] [phone] [facility]   06/29/2011 10:30 AM Steven C Klein, MD [facility] [address] [phone] [facility]     Future Orders Please Complete By Expires   Diet - low sodium heart healthy      Increase activity slowly        Allergies  Allergen Reactions  . Novocain     Rapid heart rate   Medication List  As of 05/03/2011  1:06 PM   STOP taking these medications         atenolol 25 MG tablet         TAKE these  medications         B-complex with vitamin C tablet   Take 1 tablet by mouth daily.      diltiazem 240 MG 24 hr capsule   Commonly known as: CARDIZEM CD   Take 1 capsule (240 mg total) by mouth daily.      Fish Oil 1000 MG Caps   Take 1,000 mg by mouth daily.      Grape Seed 100 MG Caps   Take 1 capsule by mouth daily.      propafenone 225 MG tablet   Commonly known as: RYTHMOL   Take 1 tablet (225 mg total) by mouth 2 (two) times daily.      ramipril 10 MG capsule   Commonly known as: ALTACE   Take 1 capsule (10 mg total) by mouth daily.      Vitamin D 1000 UNITS capsule   Take 1,000 Units by mouth daily.      warfarin 5 MG tablet   Commonly known as: COUMADIN   Take 2.5-5 mg by mouth as directed. 5mg on Monday and Friday. 2.5 mg the rest of the week.      zolpidem 10 MG tablet   Commonly known as: AMBIEN   Take 10 mg by mouth at bedtime. For sleep           Follow-up Information    Follow up with Michael Norins, MD. (As needed)       Follow up with Steven Klein, MD. (Monday, April 29th at 11:30)    Contact information:   [address], [address]  [place] [address] [phone]          BRING ALL MEDICATIONS WITH YOU TO FOLLOW UP APPOINTMENTS  Time spent with patient to include physician time: 35 min Signed: Lamarr Feenstra 05/03/2011, 1:06 PM Co-Sign MD  

## 2011-05-03 NOTE — Progress Notes (Signed)
ANTICOAGULATION CONSULT NOTE - Initial Consult  Pharmacy Consult for Heparin/Coumadin Indication: atrial fibrillation  Allergies  Allergen Reactions  . Novocain     Rapid heart rate    Patient Measurements: Weight: 124 lb 9 oz (56.5 kg) Heparin Dosing Weight: 57 kg  Vital Signs: Temp: 98.6 F (37 C) (04/08 0750) Temp src: Oral (04/08 0750) BP: 105/68 mmHg (04/08 0948) Pulse Rate: 60  (04/08 0948)  Labs:  Basename 05/03/11 0601 05/02/11 1140 05/02/11 0550 05/02/11 0001 05/02/11 05/01/11 1150 05/01/11 1113  HGB 12.7 -- 14.5 -- -- -- --  HCT 37.1 -- 42.6 -- 40.2 -- --  PLT 144* -- 166 -- 152 -- --  APTT -- -- -- -- -- -- --  LABPROT 23.7* -- 18.2* -- -- -- 17.9*  INR 2.07* -- 1.48 -- -- -- 1.45  HEPARINUNFRC 0.52 -- 0.57 0.48 -- -- --  CREATININE -- -- -- -- -- 0.80 0.69  CKTOTAL -- 69 69 -- 68 -- --  CKMB -- 2.3 2.0 -- 2.0 -- --  TROPONINI -- <0.30 <0.30 -- <0.30 -- --   The CrCl is unknown because both a height and weight (above a minimum accepted value) are required for this calculation.  Medical History: Past Medical History  Diagnosis Date  . TIA (transient ischemic attack)   . PAF (paroxysmal atrial fibrillation)     has been on Flecainide in the past. Stopped 02/10/11; Remains on coumadin anticoagulation  . HTN (hypertension)   . Gross hematuria   . Dysphagia   . Endometriosis   . Osteopenia   . Insomnia   . Chronic anticoagulation     on coumadin  . Dizziness     improved with Meclizine    Medications:  Scheduled:     . B-complex with vitamin C  1 tablet Oral Daily  . cholecalciferol  1,000 Units Oral Daily  . diltiazem  240 mg Oral Daily  . off the beat book   Does not apply Once  . propafenone  225 mg Oral BID  . warfarin  5 mg Oral ONCE-1800  . warfarin  5 mg Oral ONCE-1800  . Warfarin - Pharmacist Dosing Inpatient   Does not apply q1800  . zolpidem  10 mg Oral QHS  . DISCONTD: aspirin EC  81 mg Oral Daily  . DISCONTD: atenolol  25 mg Oral  Daily   Home warfarin dose: 2.5mg  daily except 5mg  on Mon/Fri.  Pt states this was decreased by 2.5mg  weekly about 2 weeks ago for supra-therapeutic INR. Pt reports compliance with warfarin and diet.  Assessment: Pt on chronic warfarin for PAF presents to hospital with symptomatic Afib. INR sub-therapeutic on admission. INR now 2.0 at goal so will d/c heparin bridge. No bleeding complications noted. Noted plans to possibly transition to a different oral anticoagulant at outpatient office visit.   Goal of Therapy:  INR 2-3 Heparin level 0.3-0.7 units/ml   Plan:  1) Repeat warfarin 5mg  po x 1 today 2) D/c heparin infusion 3) Check daily INR.  Severiano Gilbert 05/03/2011,10:15 AM

## 2011-05-03 NOTE — Progress Notes (Signed)
UR Completed. Simmons, Casy Brunetto F [phone]  

## 2011-05-03 NOTE — Progress Notes (Signed)
Pt given discharge instructions and both patient and her son verbalized understanding of instructions.  Pt advised to  Leave the building via wheelchair but declined adamantly and insisted upon ambulating out of facility.

## 2011-05-05 NOTE — Discharge Summary (Signed)
CARDIOLOGY DISCHARGE SUMMARY   Patient ID: Selena Taylor MRN: [id number] DOB/AGE: 02/06/1935 76 y.o.  Admit date: 05/01/2011 Discharge date: 05/03/2011  Primary Discharge Diagnosis:  PAF Secondary Discharge Diagnosis:  Patient Active Problem List  Diagnoses  . INSOMNIA, CHRONIC  . HYPERTENSION  . PAROXYSMAL ATRIAL FIBRILLATION  . GROSS HEMATURIA  . ENDOMETRIOSIS  . OSTEOPENIA  . DYSPHAGIA  . DIARRHEA  . POLYPECTOMY, HX OF  . TIA (transient ischemic attack)  . Dizziness   Hospital Course: Selena Taylor is a 76 year old female with a history of atrial fibrillation. She had palpitations and came to the hospital where she was in atrial fibrillation with rapid ventricular response. She had some problems with hypotension and was admitted for further evaluation and treatment.  She was in and out of A. fib. Her INR was subtherapeutic at 1.45. She was continued on Coumadin and started on heparin. She was initially continued on her atenolol, but was seen by Dr. Graciela Husbands who reviewed the options. He felt that she did not tolerate her atrial fibrillation very well. He felt maintaining sinus rhythm was very important. She had not tolerated propanolol or flecainide in the past with dizziness and had had similar problems with a higher dose of atenolol. After reviewing the options he recommended Rythmol. Because of the Rythmol she should be off her atenolol.  By 05/03/2011, she was therapeutic on her Coumadin. She was maintaining sinus rhythm on the Rythmol. Dr. Graciela Husbands noted that she had a stressful family interaction prior to her atrial fib which had been a trigger in the past. He felt her alcohol intake was stable. She was in the day without chest pain or shortness of breath and considered stable for discharge, to followup as an outpatient.  Labs:  Lab Results  Component Value Date   WBC 3.8* 05/03/2011   HGB 12.7 05/03/2011   HCT 37.1 05/03/2011   MCV 95.6 05/03/2011   PLT 144* 05/03/2011    Lab 05/01/11 1150  05/01/11 1113  NA 138 --  K 5.0 --  CL 104 --  CO2 -- 23  BUN 14 --  CREATININE 0.80 --  CALCIUM -- 9.0  PROT -- --  BILITOT -- --  ALKPHOS -- --  ALT -- --  AST -- --  GLUCOSE 105* --    Basename 05/02/11 1140 05/02/11 0550 05/02/11  CKTOTAL 69 69 68  CKMB 2.3 2.0 2.0  CKMBINDEX -- -- --  TROPONINI <0.30 <0.30 <0.30    Basename 05/03/11 0601  INR 2.07*       Radiology: Dg Chest 2 View  05/01/2011  *RADIOLOGY REPORT*  Clinical Data:  Midsternal chest pain and atrial fibrillation.  CHEST - 2 VIEW  Comparison: 06/13/2010  Findings: The heart size and mediastinal contours are within normal limits.  Both lungs are clear.  The visualized skeletal structures are unremarkable.  IMPRESSION: No active disease.  Original Report Authenticated By: Reola Calkins, M.D.    EKG: 03-May-2011 06:25:41 [facility] ROUTINE RECORD Suspect arm lead reversal, interpretation assumes no reversal Sinus bradycardia Lateral infarct , age undetermined ST & T wave abnormality, consider inferior ischemia Abnormal ECG 40mm/s 61mm/mV 150Hz  7.1.1 12SL 237 CID: 21 Referred by: Duke Salvia Newly Acquired Vent. rate 52 BPM PR interval 150 ms QRS duration 84 ms QT/QTc 454/422 ms P-R-T axes 109 151 194   FOLLOW UP PLANS AND APPOINTMENTS Discharge Orders    Future Appointments: Provider: Department: Dept Phone: Center:   05/13/2011 2:00  PM [facility] [phone] None   05/24/2011 11:30 AM Duke Salvia, MD [facility] [phone] [facility]   06/29/2011 10:30 AM Duke Salvia, MD [facility] [phone] [facility]     Future Orders Please Complete By Expires   Diet - low sodium heart healthy      Increase activity slowly        Allergies  Allergen Reactions  . Novocain     Rapid heart rate   Medication List  As of 05/03/2011  1:06 PM   STOP taking these medications         atenolol 25 MG tablet         TAKE these  medications         B-complex with vitamin C tablet   Take 1 tablet by mouth daily.      diltiazem 240 MG 24 hr capsule   Commonly known as: CARDIZEM CD   Take 1 capsule (240 mg total) by mouth daily.      Fish Oil 1000 MG Caps   Take 1,000 mg by mouth daily.      Grape Seed 100 MG Caps   Take 1 capsule by mouth daily.      propafenone 225 MG tablet   Commonly known as: RYTHMOL   Take 1 tablet (225 mg total) by mouth 2 (two) times daily.      ramipril 10 MG capsule   Commonly known as: ALTACE   Take 1 capsule (10 mg total) by mouth daily.      Vitamin D 1000 UNITS capsule   Take 1,000 Units by mouth daily.      warfarin 5 MG tablet   Commonly known as: COUMADIN   Take 2.5-5 mg by mouth as directed. 5mg  on Monday and Friday. 2.5 mg the rest of the week.      zolpidem 10 MG tablet   Commonly known as: AMBIEN   Take 10 mg by mouth at bedtime. For sleep           Follow-up Information    Follow up with Illene Regulus, MD. (As needed)       Follow up with Sherryl Manges, MD. (Monday, April 29th at 11:30)    Contact information:   [address], [address] [place] [address] [phone]          BRING ALL MEDICATIONS WITH YOU TO FOLLOW UP APPOINTMENTS  Time spent with patient to include physician time: 35 min Signed: Theodore Demark 05/03/2011, 1:06 PM Co-Sign MD

## 2011-05-10 ENCOUNTER — Telehealth: Payer: Self-pay | Admitting: Internal Medicine

## 2011-05-10 NOTE — Telephone Encounter (Signed)
New msg Pt wants to talk to you about side effects of propafenone. Please call her back

## 2011-05-10 NOTE — Telephone Encounter (Signed)
Spoke with pt, she was recently in the hosp and started on propafenone 225 mg bid. She reports that it makes her feel bad. She has headaches and other complaints. She wants to know what else she can take. Will discuss with dr Graciela Husbands.

## 2011-05-10 NOTE — Telephone Encounter (Signed)
Discussed pt issues with dr Graciela Husbands, pt told to try to make it until her follow up appt. Per dr Graciela Husbands it would be better for her in the long run. Pt aware and states she will be fine until she sees dr Graciela Husbands. She will call back if needed prior to appt.

## 2011-05-13 ENCOUNTER — Ambulatory Visit (INDEPENDENT_AMBULATORY_CARE_PROVIDER_SITE_OTHER): Payer: Medicare Other

## 2011-05-13 DIAGNOSIS — Z8679 Personal history of other diseases of the circulatory system: Secondary | ICD-10-CM

## 2011-05-13 DIAGNOSIS — I4891 Unspecified atrial fibrillation: Secondary | ICD-10-CM

## 2011-05-13 DIAGNOSIS — G459 Transient cerebral ischemic attack, unspecified: Secondary | ICD-10-CM

## 2011-05-13 LAB — POCT INR: INR: 3

## 2011-05-24 ENCOUNTER — Ambulatory Visit (INDEPENDENT_AMBULATORY_CARE_PROVIDER_SITE_OTHER): Payer: Medicare Other | Admitting: Internal Medicine

## 2011-05-24 ENCOUNTER — Ambulatory Visit (INDEPENDENT_AMBULATORY_CARE_PROVIDER_SITE_OTHER): Payer: Medicare Other | Admitting: *Deleted

## 2011-05-24 ENCOUNTER — Encounter: Payer: Self-pay | Admitting: *Deleted

## 2011-05-24 ENCOUNTER — Encounter: Payer: Self-pay | Admitting: Internal Medicine

## 2011-05-24 VITALS — BP 134/92 | HR 128 | Ht 62.5 in | Wt 125.0 lb

## 2011-05-24 DIAGNOSIS — I4891 Unspecified atrial fibrillation: Secondary | ICD-10-CM

## 2011-05-24 DIAGNOSIS — Z8679 Personal history of other diseases of the circulatory system: Secondary | ICD-10-CM

## 2011-05-24 DIAGNOSIS — G459 Transient cerebral ischemic attack, unspecified: Secondary | ICD-10-CM

## 2011-05-24 LAB — POCT INR: INR: 2.8

## 2011-05-24 NOTE — Assessment & Plan Note (Signed)
The patient has atrial flutter now in the setting of 1C antiarrhythmic therapy. We discussed options extensively. They would include cardioversion, catheter ablation of the flutter substrate with  ongoing use of 1C therapy. Alternatively we could try another antiarrhythmic drug but I would need to look at whether she be a candidate for dofetilide, or we could refer for catheter ablation of her atrial fibrillation and atrial flutter substrate. At about 25 minutes of discussion, we have elected to proceed with cardioversion and continuing her  current therapies

## 2011-05-24 NOTE — Patient Instructions (Signed)
Your physician has recommended that you have a Cardioversion (DCCV). Electrical Cardioversion uses a jolt of electricity to your heart either through paddles or wired patches attached to your chest. This is a controlled, usually prescheduled, procedure. Defibrillation is done under light anesthesia in the hospital, and you usually go home the day of the procedure. This is done to get your heart back into a normal rhythm. You are not awake for the procedure. Please see the instruction sheet given to you today.  Your physician recommends that you schedule a follow-up appointment in: 3-4 weeks with Dr. Graciela Husbands.

## 2011-05-24 NOTE — Progress Notes (Signed)
HPI  Selena Taylor is a 76 y.o. female Seen in followup for atrial arrhythmias specifically atrial fibrillation. She was recently hospitalized for atrial fibrillation with a rapid ventricular rate; previously she had flailed flecainide and so she was started on Rythmol.  She comes in now with recurrence of fluttering and some exercise intolerance. She is tolerating the Rythmol.    Past Medical History  Diagnosis Date  . TIA (transient ischemic attack)   . PAF (paroxysmal atrial fibrillation)     has been on Flecainide in the past. Stopped 02/10/11; Remains on coumadin anticoagulation  . HTN (hypertension)   . Gross hematuria   . Dysphagia   . Endometriosis   . Osteopenia   . Insomnia   . Chronic anticoagulation     on coumadin  . Dizziness     improved with Meclizine    Past Surgical History  Procedure Date  . Polypectomy   . Total hysterectomy and bilateral salpingoopherectomy   . Appendectomy   . Skin cancer removal   . Cardioversion 01/14/2011    Procedure: CARDIOVERSION;  Surgeon: Duke Salvia, MD;  Location: [facility] OR;  Service: Cardiovascular;  Laterality: N/A;  To be completed in Neuro OR 33 time slot 0830 12/20    Current Outpatient Prescriptions  Medication Sig Dispense Refill  . B Complex-C (B-COMPLEX WITH VITAMIN C) tablet Take 1 tablet by mouth daily.        . Cholecalciferol (VITAMIN D) 1000 UNITS capsule Take 1,000 Units by mouth daily.       Marland Kitchen diltiazem (CARDIZEM CD) 240 MG 24 hr capsule Take 1 capsule (240 mg total) by mouth daily.  90 capsule  3  . Grape Seed 100 MG CAPS Take 1 capsule by mouth daily.       . Omega-3 Fatty Acids (FISH OIL) 1000 MG CAPS Take 1,000 mg by mouth daily.       . propafenone (RYTHMOL) 225 MG tablet Take 1 tablet (225 mg total) by mouth 2 (two) times daily.  60 tablet  11  . ramipril (ALTACE) 10 MG capsule Take 1 capsule (10 mg total) by mouth daily.  30 capsule  11  . warfarin (COUMADIN) 5 MG tablet Take 2.5-5 mg by mouth as  directed. 5mg  on Monday and Friday. 2.5 mg the rest of the week.      . zolpidem (AMBIEN) 10 MG tablet Take 10 mg by mouth at bedtime. For sleep       Current Facility-Administered Medications  Medication Dose Route Frequency Provider Last Rate Last Dose  . 0.9 %  sodium chloride infusion  250 mL Intravenous Continuous [name], PA      . sodium chloride 0.9 % injection 3 mL  3 mL Intravenous Q12H Scott T Weaver, PA      . sodium chloride 0.9 % injection 3 mL  3 mL Intravenous PRN [name], PA        Allergies  Allergen Reactions  . Novocain     Rapid heart rate    Review of Systems negative except from HPI and PMH  Physical Exam BP 134/92  Pulse 128  Ht 5' 2.5" (1.588 m)  Wt 125 lb (56.7 kg)  BMI 22.50 kg/m2 Well developed and well nourished in no acute distress HENT normal E scleral and icterus clear Neck Supple JVP flat; carotids brisk and full Clear to ausculation Rapid but Regular rate and rhythm, no murmurs gallops or rub Soft with active bowel sounds  No clubbing cyanosis none Edema Alert and oriented, grossly normal motor and sensory function Skin Warm and Dry  Atrial flutter with an atrial cycle length of approximately 220 ms or so with a relatively typical pattern and 2-1 rate  Assessment and  Plan

## 2011-05-26 MED ORDER — SODIUM CHLORIDE 0.9 % IV SOLN
INTRAVENOUS | Status: DC
  Administered 2011-05-27: 10:00:00 via INTRAVENOUS

## 2011-05-27 ENCOUNTER — Encounter (HOSPITAL_COMMUNITY)
Admission: RE | Disposition: A | Discharge status: Home / Self Care | Payer: Self-pay | Source: Ambulatory Visit | Attending: Internal Medicine

## 2011-05-27 ENCOUNTER — Ambulatory Visit (HOSPITAL_COMMUNITY): Payer: Medicare Other | Admitting: Anesthesiology

## 2011-05-27 ENCOUNTER — Ambulatory Visit (HOSPITAL_COMMUNITY)
Admission: RE | Admit: 2011-05-27 | Discharge: 2011-05-27 | Disposition: A | Discharge status: Home / Self Care | Payer: Medicare Other | Source: Ambulatory Visit | Attending: Internal Medicine | Admitting: Internal Medicine

## 2011-05-27 ENCOUNTER — Encounter (HOSPITAL_COMMUNITY): Payer: Self-pay | Admitting: Anesthesiology

## 2011-05-27 DIAGNOSIS — I10 Essential (primary) hypertension: Secondary | ICD-10-CM | POA: Insufficient documentation

## 2011-05-27 DIAGNOSIS — Z7901 Long term (current) use of anticoagulants: Secondary | ICD-10-CM | POA: Insufficient documentation

## 2011-05-27 DIAGNOSIS — I4891 Unspecified atrial fibrillation: Secondary | ICD-10-CM

## 2011-05-27 DIAGNOSIS — Z8673 Personal history of transient ischemic attack (TIA), and cerebral infarction without residual deficits: Secondary | ICD-10-CM | POA: Insufficient documentation

## 2011-05-27 HISTORY — PX: CARDIOVERSION: SHX1299

## 2011-05-27 LAB — CBC
HCT: 40.5 % (ref 36.0–46.0)
Platelets: 210 10*3/uL (ref 150–400)
RDW: 13.5 % (ref 11.5–15.5)
WBC: 6.7 10*3/uL (ref 4.0–10.5)

## 2011-05-27 LAB — BASIC METABOLIC PANEL
Chloride: 106 mEq/L (ref 96–112)
GFR calc Af Amer: >90 mL/min (ref >90)
Potassium: 4.8 mEq/L (ref 3.5–5.1)

## 2011-05-27 LAB — PROTIME-INR: Prothrombin Time: 29.6 seconds — ABNORMAL HIGH (ref 11.6–15.2)

## 2011-05-27 SURGERY — CARDIOVERSION
Anesthesia: General | Wound class: Clean

## 2011-05-27 MED ORDER — SODIUM CHLORIDE 0.9 % IJ SOLN: 3.0000 mL | Freq: Two times a day (BID) | INTRAMUSCULAR | Status: DC

## 2011-05-27 MED ORDER — LIDOCAINE HCL (CARDIAC) 20 MG/ML IV SOLN
INTRAVENOUS | Status: DC | PRN
  Administered 2011-05-27: 50 mg via INTRAVENOUS

## 2011-05-27 MED ORDER — SODIUM CHLORIDE 0.9 % IJ SOLN: 3.0000 mL | INTRAMUSCULAR | Status: DC | PRN

## 2011-05-27 MED ORDER — HYDROCORTISONE 1 % EX CREA: 1.0000 "application " | TOPICAL_CREAM | Freq: Three times a day (TID) | CUTANEOUS | Status: DC | PRN

## 2011-05-27 MED ORDER — SODIUM CHLORIDE 0.9 % IV SOLN: 250.0000 mL | INTRAVENOUS | Status: DC

## 2011-05-27 MED ORDER — SODIUM CHLORIDE 0.9 % IV SOLN
INTRAVENOUS | Status: DC | PRN
  Administered 2011-05-27: 11:00:00 via INTRAVENOUS

## 2011-05-27 MED ORDER — PROPOFOL 10 MG/ML IV BOLUS
INTRAVENOUS | Status: DC | PRN
  Administered 2011-05-27: 100 mg via INTRAVENOUS

## 2011-05-27 NOTE — Preoperative (Signed)
Beta Blockers   Reason not to administer Beta Blockers:Not Applicable 

## 2011-05-27 NOTE — H&P (View-Only) (Signed)
HPI  Selena Taylor is a 75 y.o. female Seen in followup for atrial arrhythmias specifically atrial fibrillation. She was recently hospitalized for atrial fibrillation with a rapid ventricular rate; previously she had flailed flecainide and so she was started on Rythmol.  She comes in now with recurrence of fluttering and some exercise intolerance. She is tolerating the Rythmol.    Past Medical History  Diagnosis Date  . TIA (transient ischemic attack)   . PAF (paroxysmal atrial fibrillation)     has been on Flecainide in the past. Stopped 02/10/11; Remains on coumadin anticoagulation  . HTN (hypertension)   . Gross hematuria   . Dysphagia   . Endometriosis   . Osteopenia   . Insomnia   . Chronic anticoagulation     on coumadin  . Dizziness     improved with Meclizine    Past Surgical History  Procedure Date  . Polypectomy   . Total hysterectomy and bilateral salpingoopherectomy   . Appendectomy   . Skin cancer removal   . Cardioversion 01/14/2011    Procedure: CARDIOVERSION;  Surgeon: Ayame Rena C Lurine Imel, MD;  Location: [facility] OR;  Service: Cardiovascular;  Laterality: N/A;  To be completed in Neuro OR 33 time slot 0830 12/20    Current Outpatient Prescriptions  Medication Sig Dispense Refill  . B Complex-C (B-COMPLEX WITH VITAMIN C) tablet Take 1 tablet by mouth daily.        . Cholecalciferol (VITAMIN D) 1000 UNITS capsule Take 1,000 Units by mouth daily.       . diltiazem (CARDIZEM CD) 240 MG 24 hr capsule Take 1 capsule (240 mg total) by mouth daily.  90 capsule  3  . Grape Seed 100 MG CAPS Take 1 capsule by mouth daily.       . Omega-3 Fatty Acids (FISH OIL) 1000 MG CAPS Take 1,000 mg by mouth daily.       . propafenone (RYTHMOL) 225 MG tablet Take 1 tablet (225 mg total) by mouth 2 (two) times daily.  60 tablet  11  . ramipril (ALTACE) 10 MG capsule Take 1 capsule (10 mg total) by mouth daily.  30 capsule  11  . warfarin (COUMADIN) 5 MG tablet Take 2.5-5 mg by mouth as  directed. 5mg on Monday and Friday. 2.5 mg the rest of the week.      . zolpidem (AMBIEN) 10 MG tablet Take 10 mg by mouth at bedtime. For sleep       Current Facility-Administered Medications  Medication Dose Route Frequency Provider Last Rate Last Dose  . 0.9 %  sodium chloride infusion  250 mL Intravenous Continuous Scott T Weaver, PA      . sodium chloride 0.9 % injection 3 mL  3 mL Intravenous Q12H Scott T Weaver, PA      . sodium chloride 0.9 % injection 3 mL  3 mL Intravenous PRN Scott T Weaver, PA        Allergies  Allergen Reactions  . Novocain     Rapid heart rate    Review of Systems negative except from HPI and PMH  Physical Exam BP 134/92  Pulse 128  Ht 5' 2.5" (1.588 m)  Wt 125 lb (56.7 kg)  BMI 22.50 kg/m2 Well developed and well nourished in no acute distress HENT normal E scleral and icterus clear Neck Supple JVP flat; carotids brisk and full Clear to ausculation Rapid but Regular rate and rhythm, no murmurs gallops or rub Soft with active bowel sounds   No clubbing cyanosis none Edema Alert and oriented, grossly normal motor and sensory function Skin Warm and Dry  Atrial flutter with an atrial cycle length of approximately 220 ms or so with a relatively typical pattern and 2-1 rate  Assessment and  Plan  

## 2011-05-27 NOTE — Discharge Instructions (Signed)
Electrical Cardioversion Cardioversion is the delivery of a jolt of electricity to change the rhythm of the heart. Sticky patches or metal paddles are placed on the chest to deliver the electricity from a special device. This is done to restore a normal rhythm. A rhythm that is too fast or not regular keeps the heart from pumping well. Compared to medicines used to change an abnormal rhythm, cardioversion is faster and works better. It is also unpleasant and may dislodge blood clots from the heart. WHEN WOULD THIS BE DONE?  In an emergency:   There is low or no blood pressure as a result of the heart rhythm.   Normal rhythm must be restored as fast as possible to protect the brain and heart from further damage.   It may save a life.   For less serious heart rhythms, such as atrial fibrillation or flutter, in which:   The heart is beating too fast or is not regular.   The heart is still able to pump enough blood, but not as well as it should.   Medicine to change the rhythm has not worked.   It is safe to wait in order to allow time for preparation.  LET YOUR CAREGIVER KNOW ABOUT:   Every medicine you are taking. It is very important to do this! Know when to take or stop taking any of them.   Any time in the past that you have felt your heart was not beating normally.  RISKS AND COMPLICATIONS   Clots may form in the chambers of the heart if it is beating too fast. These clots may be dislodged during the procedure and travel to other parts of the body.   There is risk of a stroke during and after the procedure if a clot moves. Blood thinners lower this risk.   You may have a special test of your heart (TEE) to make sure there are no clots in your heart.  BEFORE THE PROCEDURE   You may have some tests to see how well your heart is working.   You may start taking blood thinners so your blood does not clot as easily.   Other drugs may be given to help your heart work better.   PROCEDURE (SCHEDULED)  The procedure is typically done in a hospital by a heart doctor (cardiologist).   You will be told when and where to go.   You may be given some medicine through an intravenous (IV) access to reduce discomfort and make you sleepy before the procedure.   Your whole body may move when the shock is delivered. Your chest may feel sore.   You may be able to go home after a few hours. Your heart rhythm will be watched to make sure it does not change.  HOME CARE INSTRUCTIONS   Only take medicine as directed by your caregiver. Be sure you understand how and when to take your medicine.   Learn how to feel your pulse and check it often.   Limit your activity for 48 hours.   Avoid caffeine and other stimulants as directed.  SEEK MEDICAL CARE IF:   You feel like your heart is beating too fast or your pulse is not regular.   You have any questions about your medicines.   You have bleeding that will not stop.  SEEK IMMEDIATE MEDICAL CARE IF:   You are dizzy or feel faint.   It is hard to breathe or you feel short of breath.     There is a change in discomfort in your chest.   Your speech is slurred or you have trouble moving your arm or leg on one side.   You get a muscle cramp.   Your fingers or toes turn cold or blue.  MAKE SURE YOU:   Understand these instructions.   Will watch your condition.   Will get help right away if you are not doing well or get worse.  Document Released: 01/01/2002 Document Revised: 12/31/2010 Document Reviewed: 05/03/2007 Exit[employer] Patient Information 2012 [employer]. 

## 2011-05-27 NOTE — Anesthesia Postprocedure Evaluation (Signed)
  Anesthesia Post-op Note  Patient: Selena Taylor  Procedure(s) Performed: Procedure(s) (LRB): CARDIOVERSION (N/A)  Patient Location: Short Stay  Anesthesia Type: General  Level of Consciousness: awake and alert   Airway and Oxygen Therapy: Patient Spontanous Breathing and Patient connected to nasal cannula oxygen  Post-op Pain: none  Post-op Assessment: Post-op Vital signs reviewed, Patient's Cardiovascular Status Stable, Respiratory Function Stable and Patent Airway  Post-op Vital Signs: Reviewed and stable  Complications: No apparent anesthesia complications

## 2011-05-27 NOTE — Anesthesia Preprocedure Evaluation (Addendum)
Anesthesia Evaluation  Patient identified by MRN, date of birth, ID band Patient awake    Reviewed: Allergy & Precautions, H&P , NPO status , Patient's Chart, lab work & pertinent test results  Airway Mallampati: II TM Distance: >3 FB Neck ROM: Full    Dental  (+) Teeth Intact, Caps and Dental Advisory Given   Pulmonary  breath sounds clear to auscultation        Cardiovascular hypertension, Pt. on medications + dysrhythmias Atrial Fibrillation Rhythm:Regular Rate:Normal     Neuro/Psych TIA   GI/Hepatic   Endo/Other    Renal/GU      Musculoskeletal   Abdominal   Peds  Hematology   Anesthesia Other Findings   Reproductive/Obstetrics                         Anesthesia Physical Anesthesia Plan  ASA: III  Anesthesia Plan: General   Post-op Pain Management:    Induction: Intravenous  Airway Management Planned: Mask  Additional Equipment:   Intra-op Plan:   Post-operative Plan:   Informed Consent: I have reviewed the patients History and Physical, chart, labs and discussed the procedure including the risks, benefits and alternatives for the proposed anesthesia with the patient or authorized representative who has indicated his/her understanding and acceptance.   Dental advisory given  Plan Discussed with: CRNA, Anesthesiologist and Surgeon  Anesthesia Plan Comments:         Anesthesia Quick Evaluation

## 2011-05-27 NOTE — CV Procedure (Signed)
Preop Dx  afl Post op DX  NSR  Procedure  DC Cardioversion   Pt was sedated by anesthesia receiving 100 mg Propafol  A synchronized shock 120 joules restored sinus RhythmNSR  Pt tolerated without difficulty

## 2011-05-27 NOTE — Transfer of Care (Signed)
Immediate Anesthesia Transfer of Care Note  Patient: Selena Taylor  Procedure(s) Performed: Procedure(s) (LRB): CARDIOVERSION (N/A)  Patient Location: PACU and Short Stay  Anesthesia Type: General  Level of Consciousness: awake and alert   Airway & Oxygen Therapy: Patient Spontanous Breathing and Patient connected to nasal cannula oxygen  Post-op Assessment: Report given to PACU RN, Post -op Vital signs reviewed and stable and Patient moving all extremities  Post vital signs: Reviewed and stable  Complications: No apparent anesthesia complications

## 2011-05-27 NOTE — Interval H&P Note (Signed)
History and Physical Interval Note:  05/27/2011 11:16 AM  Selena Taylor  has presented today for surgery, with the diagnosis of AFLUTTER  The various methods of treatment have been discussed with the patient and family. After consideration of risks, benefits and other options for treatment, the patient has consented to  Procedure(s) (LRB): CARDIOVERSION (N/A) as a surgical intervention .  The patients' history has been reviewed, patient examined, no change in status, stable for surgery.  I have reviewed the patients' chart and labs.  Questions were answered to the patient's satisfaction.     Sherryl Manges

## 2011-05-31 ENCOUNTER — Encounter (HOSPITAL_COMMUNITY): Payer: Self-pay | Admitting: Internal Medicine

## 2011-06-01 NOTE — Progress Notes (Signed)
Addended by: Vista Mink D on: 06/01/2011 05:51 PM   Modules accepted: Orders

## 2011-06-15 ENCOUNTER — Ambulatory Visit (INDEPENDENT_AMBULATORY_CARE_PROVIDER_SITE_OTHER): Payer: Medicare Other | Admitting: *Deleted

## 2011-06-15 DIAGNOSIS — I4891 Unspecified atrial fibrillation: Secondary | ICD-10-CM

## 2011-06-15 DIAGNOSIS — Z8679 Personal history of other diseases of the circulatory system: Secondary | ICD-10-CM

## 2011-06-15 DIAGNOSIS — G459 Transient cerebral ischemic attack, unspecified: Secondary | ICD-10-CM

## 2011-06-15 LAB — POCT INR: INR: 3.7

## 2011-06-24 ENCOUNTER — Encounter: Payer: Self-pay | Admitting: Internal Medicine

## 2011-06-24 ENCOUNTER — Other Ambulatory Visit: Payer: Self-pay

## 2011-06-24 ENCOUNTER — Ambulatory Visit (INDEPENDENT_AMBULATORY_CARE_PROVIDER_SITE_OTHER): Payer: Medicare Other | Admitting: Internal Medicine

## 2011-06-24 VITALS — BP 129/80 | HR 109 | Ht 62.0 in | Wt 124.8 lb

## 2011-06-24 DIAGNOSIS — I4891 Unspecified atrial fibrillation: Secondary | ICD-10-CM

## 2011-06-24 MED ORDER — DILTIAZEM HCL ER COATED BEADS 240 MG PO CP24: ORAL_CAPSULE | ORAL | Status: DC

## 2011-06-24 NOTE — Patient Instructions (Signed)
Your physician has recommended you make the following change in your medication:  1) Stop Rhythmol (propafenone). 2) Increase Diltiazem to 240 mg twice daily.  Your physician recommends that you schedule a follow-up appointment in: 3 weeks with Tereso Newcomer, PA.

## 2011-06-24 NOTE — Assessment & Plan Note (Signed)
Will control

## 2011-06-24 NOTE — Assessment & Plan Note (Signed)
The patient has recurrent atrial flutter in the context of atrial flutters and fibrillation. Her heart rate is fast. She has dizziness likely attributable to her Rythmol although she has baseline dizziness.  We spent about 30 minutes discussing options. These included 1 Augmented rate control, 2-adjunctive atrial flutter ablation in the context of ongoing 1C therapy-precluded by the symptoms associated with Rythmol and atrial fibrillation seen with flecainide, 3-amiodarone with its issues of side effects, 4-multaq with issues of increased mortality in patients with persistent arrhythmias, 5-catheter ablation of the atrial fibrillation and flutter substrates. We discussed potential risks associated with the latter.  We have decided to pursue the following course. We'll discontinue her Rythmol, increase her diltiazem from 240-480 at her blood pressure is relatively sparing of this, in the event that in 4 weeks her rate remains poorly controlled, we will resume diltiazem at 240 and begin her on amiodarone with the intention of cardioversion 3-4 weeks later. I will have her see one of the PAs at that intermediate visit for this assessment

## 2011-06-24 NOTE — Progress Notes (Signed)
HPI  Selena Taylor is a 76 y.o. female Is seen following DCCV undertaken on RHYTHMOL  She notes increase in weight althogh VS dont reflect that  She has complaints of dizziness which are worse since taking the Rythmol. There are some episodes of exercise intolerance and intermittent tachycardia palpitations  Echo January 2013-normal LV function and a left atrial dimension of 37   has reverted to AFLutter  I have reviewed tracings over the last year and they mostly represent flutter although different flutter waves;  Fib was clearly seen in May 2012  Past Medical History  Diagnosis Date  . TIA (transient ischemic attack)   . PAF (paroxysmal atrial fibrillation)     has been on Flecainide in the past. Stopped 02/10/11; Remains on coumadin anticoagulation  . HTN (hypertension)   . Gross hematuria   . Dysphagia   . Endometriosis   . Osteopenia   . Insomnia   . Chronic anticoagulation     on coumadin  . Dizziness     improved with Meclizine    Past Surgical History  Procedure Date  . Polypectomy   . Total hysterectomy and bilateral salpingoopherectomy   . Appendectomy   . Skin cancer removal   . Cardioversion 01/14/2011    Procedure: CARDIOVERSION;  Surgeon: Duke Salvia, MD;  Location: [facility] OR;  Service: Cardiovascular;  Laterality: N/A;  To be completed in Neuro OR 33 time slot 0830 12/20  . Cardioversion 05/27/2011    Procedure: CARDIOVERSION;  Surgeon: Duke Salvia, MD;  Location: [facility] OR;  Service: Cardiovascular;  Laterality: N/A;    Current Outpatient Prescriptions  Medication Sig Dispense Refill  . B Complex-C (B-COMPLEX WITH VITAMIN C) tablet Take 1 tablet by mouth daily.        . Cholecalciferol (VITAMIN D) 1000 UNITS capsule Take 1,000 Units by mouth daily.       Marland Kitchen diltiazem (CARDIZEM CD) 240 MG 24 hr capsule Take 1 capsule (240 mg total) by mouth daily.  90 capsule  3  . Grape Seed 100 MG CAPS Take 1 capsule by mouth daily.       . Omega-3 Fatty Acids (FISH OIL)  1000 MG CAPS Take 1,000 mg by mouth daily.       . propafenone (RYTHMOL) 225 MG tablet Take 225 mg by mouth 2 (two) times daily.      . ramipril (ALTACE) 10 MG capsule Take 1 capsule (10 mg total) by mouth daily.  30 capsule  11  . warfarin (COUMADIN) 5 MG tablet Take 2.5-5 mg by mouth as directed. 5mg  on Monday and Friday. 2.5 mg the rest of the week.      . zolpidem (AMBIEN) 10 MG tablet Take 10 mg by mouth at bedtime. For sleep       Current Facility-Administered Medications  Medication Dose Route Frequency Provider Last Rate Last Dose  . 0.9 %  sodium chloride infusion  250 mL Intravenous Continuous [name], PA      . sodium chloride 0.9 % injection 3 mL  3 mL Intravenous Q12H Scott T Weaver, PA      . sodium chloride 0.9 % injection 3 mL  3 mL Intravenous PRN [name], PA        Allergies  Allergen Reactions  . Procaine Hcl     Rapid heart rate    Review of Systems negative except from HPI and PMH  Physical Exam BP 129/80  Pulse 109  Ht 5'  2" (1.575 m)  Wt 124 lb 12.8 oz (56.609 kg)  BMI 22.83 kg/m2  Well developed and well nourished in no acute distress HENT normal E scleral and icterus clear Neck Supple JVP flat; carotids brisk and full Clear to ausculation Rapid Regular rate and rhythm, no murmurs gallops or rub Soft with active bowel sounds No clubbing cyanosis none Edema Alert and oriented, grossly normal motor and sensory function  Atrial flutter-relatively typical with an atrial cycle length 255 ms and a ventricular rate of 110  Skin Warm and Dry    Assessment and  Plan

## 2011-06-29 ENCOUNTER — Ambulatory Visit: Payer: Medicare Other | Admitting: Internal Medicine

## 2011-06-29 ENCOUNTER — Other Ambulatory Visit: Payer: Self-pay | Admitting: *Deleted

## 2011-06-29 ENCOUNTER — Ambulatory Visit (INDEPENDENT_AMBULATORY_CARE_PROVIDER_SITE_OTHER): Payer: Medicare Other

## 2011-06-29 DIAGNOSIS — I4891 Unspecified atrial fibrillation: Secondary | ICD-10-CM

## 2011-06-29 DIAGNOSIS — G459 Transient cerebral ischemic attack, unspecified: Secondary | ICD-10-CM

## 2011-06-29 DIAGNOSIS — Z8679 Personal history of other diseases of the circulatory system: Secondary | ICD-10-CM

## 2011-06-29 MED ORDER — ZOLPIDEM TARTRATE 10 MG PO TABS: 10.0000 mg | ORAL_TABLET | Freq: Every day | ORAL | Status: DC

## 2011-06-29 NOTE — Telephone Encounter (Signed)
Pt requesting refill on her zolpidem. Is this ok?... 06/29/11@9 :53am/LMB

## 2011-06-30 NOTE — Telephone Encounter (Signed)
Faxed script bck to [facility]... 06/30/11@10 :28am/LMB

## 2011-07-13 ENCOUNTER — Ambulatory Visit (INDEPENDENT_AMBULATORY_CARE_PROVIDER_SITE_OTHER): Payer: Medicare Other | Admitting: *Deleted

## 2011-07-13 DIAGNOSIS — G459 Transient cerebral ischemic attack, unspecified: Secondary | ICD-10-CM

## 2011-07-13 DIAGNOSIS — Z8679 Personal history of other diseases of the circulatory system: Secondary | ICD-10-CM

## 2011-07-13 DIAGNOSIS — I4891 Unspecified atrial fibrillation: Secondary | ICD-10-CM

## 2011-07-13 LAB — POCT INR: INR: 2.4

## 2011-07-14 ENCOUNTER — Encounter: Payer: Self-pay | Admitting: Physician Assistant

## 2011-07-14 ENCOUNTER — Ambulatory Visit (INDEPENDENT_AMBULATORY_CARE_PROVIDER_SITE_OTHER): Payer: Medicare Other | Admitting: Physician Assistant

## 2011-07-14 VITALS — BP 116/69 | HR 65 | Ht 62.0 in | Wt 125.0 lb

## 2011-07-14 DIAGNOSIS — I4891 Unspecified atrial fibrillation: Secondary | ICD-10-CM

## 2011-07-14 NOTE — Progress Notes (Signed)
[address] [place], [place]  [address] Phone: [phone] Fax:  [phone]  Date:  07/14/2011   Name:  Selena Taylor   DOB:  1935/10/14   MRN:  [id number]  PCP:  Illene Regulus, MD  Primary Cardiologist/Primary Electrophysiologist:  Dr. Sherryl Manges    History of Present Illness: Selena Taylor is a 76 y.o. female who returns for follow up.  She has a hx of atrial fibrillation, HTN, prior TIA.  She is on coumadin.  Echo 01/2011:  EF 55-60%, mild MR, mild LAE.  Myoview 6/12:  No ischemia, EF 74%.  She has been intolerant to multiple beta blockers.  She has failed flecainide.  She recently underwent DCCV while on Rythmol.  She f/u with Dr. Sherryl Manges 06/24/11.  She noted significant dizziness.  She was noted to be in AFlutter.  Several options were reviewed and it was ultimately decided to increase Diltiazem to 240 mg bid, stop Rythmol and follow up today.  If she continued to have poorly controlled HR, the plan was to start amiodarone and reduce diltiazem to 240 mg QD and plan DCCV again in about 3-4 weeks.  She is back in NSR today.  States she feels great.  Started feeling much better after stopping Rythmol.  No palpitations.  No chest pain, dyspnea, syncope, orthopnea, PND or edema.  She had a skin CA removed yesterday with her dermatologist and is doing well.  Pathology is pending.    Wt Readings from Last 3 Encounters:  07/14/11 125 lb (56.7 kg)  06/24/11 124 lb 12.8 oz (56.609 kg)  05/27/11 125 lb (56.7 kg)     Potassium  Date/Time Value Range Status  05/27/2011  9:54 AM 4.8  3.5 - 5.1 mEq/L Final     Creatinine, Ser  Date/Time Value Range Status  05/27/2011  9:54 AM 0.64  0.50 - 1.10 mg/dL Final     ALT  Date/Time Value Range Status  12/22/2010  8:23 PM 17  0 - 35 U/L Final     TSH  Date/Time Value Range Status  05/02/2011 11:29 AM 1.196  0.350 - 4.500 uIU/mL Final     Hemoglobin  Date/Time Value Range Status  05/27/2011  9:54 AM 13.7  12.0 -  15.0 g/dL Final    Past Medical History  Diagnosis Date  . TIA (transient ischemic attack)   . PAF (paroxysmal atrial fibrillation)     has been on Flecainide in the past. Stopped 02/10/11; Remains on coumadin anticoagulation; intol to Rythmol and failed DCCV; noted to be in AFlutter 5/13;  Echo 01/2011:  EF 55-60%, mild MR, mild LAE.  Myoview 6/12:  No ischemia, EF 74%  . HTN (hypertension)   . Gross hematuria   . Dysphagia   . Endometriosis   . Osteopenia   . Insomnia   . Chronic anticoagulation     on coumadin  . Dizziness     improved with Meclizine    Current Outpatient Prescriptions  Medication Sig Dispense Refill  . B Complex-C (B-COMPLEX WITH VITAMIN C) tablet Take 1 tablet by mouth daily.        . Cholecalciferol (VITAMIN D) 1000 UNITS capsule Take 1,000 Units by mouth daily.       . clindamycin (CLEOCIN) 300 MG capsule Take 300 mg by mouth 4 (four) times daily.       Marland Kitchen diltiazem (CARDIZEM CD) 240 MG 24 hr capsule Take one capsule by mouth twice daily.  60  capsule  6  . Grape Seed 100 MG CAPS Take 1 capsule by mouth daily.       Marland Kitchen HYDROcodone-acetaminophen (VICODIN) 5-500 MG per tablet Take 1 tablet by mouth as needed.       . Omega-3 Fatty Acids (FISH OIL) 1000 MG CAPS Take 1,000 mg by mouth daily.       Marland Kitchen warfarin (COUMADIN) 5 MG tablet Take 2.5-5 mg by mouth as directed. 5mg  on Monday and Friday. 2.5 mg the rest of the week.      . zolpidem (AMBIEN) 10 MG tablet Take 1 tablet (10 mg total) by mouth at bedtime. For sleep  30 tablet  5   Current Facility-Administered Medications  Medication Dose Route Frequency Provider Last Rate Last Dose  . 0.9 %  sodium chloride infusion  250 mL Intravenous Continuous [name], PA      . DISCONTD: sodium chloride 0.9 % injection 3 mL  3 mL Intravenous Q12H [name], PA      . DISCONTD: sodium chloride 0.9 % injection 3 mL  3 mL Intravenous PRN [name], PA        Allergies: Allergies  Allergen Reactions  .  Procaine Hcl     Rapid heart rate    History  Substance Use Topics  . Smoking status: Never Smoker   . Smokeless tobacco: Not on file  . Alcohol Use: 8.4 oz/week    14 Glasses of wine per week     1-2 glasses of wine nightly     PHYSICAL EXAM: VS:  BP 116/69  Pulse 65  Ht 5\' 2"  (1.575 m)  Wt 125 lb (56.7 kg)  BMI 22.86 kg/m2 Well nourished, well developed, in no acute distress HEENT: normal Neck: no JVD Cardiac:  normal S1, S2; RRR; no murmur Lungs:  clear to auscultation bilaterally, no wheezing, rhonchi or rales Abd: soft, nontender, no hepatomegaly Ext: no edema Skin: warm and dry; nasal bandage in place Neuro:  CNs 2-12 intact, no focal abnormalities noted  EKG:  NSR, HR 62, normal axis, PVC, no acute changes   ASSESSMENT AND PLAN:  1.  Atrial Fibrillation/Flutter She is maintaining NSR on just rate control Rx. She is feeling much better off of the Rythmol. States she feels "great!" Remains on coumadin. Continue current Rx. She is aware of when her heart goes out of rhythm. Follow up with Dr. Sherryl Manges in 6-8 weeks or sooner if she returns to AFib/Flutter.   Luna Glasgow, PA-C  10:23 AM 07/14/2011

## 2011-07-14 NOTE — Patient Instructions (Addendum)
Your physician recommends that you schedule a follow-up appointment in: 6-8 WEEKS WITH DR. Graciela Husbands    NO CHANGES WERE MADE TODAY

## 2011-08-09 ENCOUNTER — Ambulatory Visit (INDEPENDENT_AMBULATORY_CARE_PROVIDER_SITE_OTHER): Payer: Medicare Other

## 2011-08-09 DIAGNOSIS — Z8679 Personal history of other diseases of the circulatory system: Secondary | ICD-10-CM

## 2011-08-09 DIAGNOSIS — G459 Transient cerebral ischemic attack, unspecified: Secondary | ICD-10-CM

## 2011-08-09 DIAGNOSIS — I4891 Unspecified atrial fibrillation: Secondary | ICD-10-CM

## 2011-08-09 LAB — POCT INR: INR: 1.6

## 2011-08-23 ENCOUNTER — Ambulatory Visit (INDEPENDENT_AMBULATORY_CARE_PROVIDER_SITE_OTHER): Payer: Medicare Other | Admitting: *Deleted

## 2011-08-23 DIAGNOSIS — Z8679 Personal history of other diseases of the circulatory system: Secondary | ICD-10-CM

## 2011-08-23 DIAGNOSIS — I4891 Unspecified atrial fibrillation: Secondary | ICD-10-CM

## 2011-08-23 DIAGNOSIS — G459 Transient cerebral ischemic attack, unspecified: Secondary | ICD-10-CM

## 2011-08-23 LAB — POCT INR: INR: 2.4

## 2011-08-30 ENCOUNTER — Other Ambulatory Visit: Payer: Self-pay | Admitting: Internal Medicine

## 2011-08-30 MED ORDER — WARFARIN SODIUM 5 MG PO TABS: ORAL_TABLET | ORAL | Status: DC

## 2011-09-16 ENCOUNTER — Ambulatory Visit (INDEPENDENT_AMBULATORY_CARE_PROVIDER_SITE_OTHER): Payer: Medicare Other | Admitting: *Deleted

## 2011-09-16 DIAGNOSIS — Z8679 Personal history of other diseases of the circulatory system: Secondary | ICD-10-CM

## 2011-09-16 DIAGNOSIS — G459 Transient cerebral ischemic attack, unspecified: Secondary | ICD-10-CM

## 2011-09-16 DIAGNOSIS — I4891 Unspecified atrial fibrillation: Secondary | ICD-10-CM

## 2011-09-24 ENCOUNTER — Ambulatory Visit (INDEPENDENT_AMBULATORY_CARE_PROVIDER_SITE_OTHER): Payer: Medicare Other | Admitting: Internal Medicine

## 2011-09-24 ENCOUNTER — Encounter: Payer: Self-pay | Admitting: Internal Medicine

## 2011-09-24 VITALS — BP 136/72 | HR 64 | Ht 62.0 in | Wt 123.0 lb

## 2011-09-24 DIAGNOSIS — I10 Essential (primary) hypertension: Secondary | ICD-10-CM

## 2011-09-24 DIAGNOSIS — I4891 Unspecified atrial fibrillation: Secondary | ICD-10-CM

## 2011-09-24 NOTE — Assessment & Plan Note (Signed)
Stable. I suspect we will have reversion to atrial fibrillation at some point. Right now will continue on rate control; she might well be a candidate for catheter ablation

## 2011-09-24 NOTE — Progress Notes (Signed)
HPI  Selena Taylor is a 76 y.o. female Is seen following DCCV undertaken on RHYTHMOL  She has significant symptoms with her Rythmol was discontinued. She saw Tereso Newcomer 2 months ago and felt great.  Echo January 2013-normal LV function and a left atrial dimension of 37    She has afib and aflutter  But recently has been doing great. She is moved into a new house. Her husband remains in [place]. His dementia is worsening. Situations with the children are stable.  Past Medical History  Diagnosis Date  . TIA (transient ischemic attack)   . PAF (paroxysmal atrial fibrillation)     has been on Flecainide in the past. Stopped 02/10/11; Remains on coumadin anticoagulation; intol to Rythmol and failed DCCV; noted to be in AFlutter 5/13;  Echo 01/2011:  EF 55-60%, mild MR, mild LAE.  Myoview 6/12:  No ischemia, EF 74%  . HTN (hypertension)   . Gross hematuria   . Dysphagia   . Endometriosis   . Osteopenia   . Insomnia   . Chronic anticoagulation     on coumadin  . Dizziness     improved with Meclizine    Past Surgical History  Procedure Date  . Polypectomy   . Total hysterectomy and bilateral salpingoopherectomy   . Appendectomy   . Skin cancer removal   . Cardioversion 01/14/2011    Procedure: CARDIOVERSION;  Surgeon: Duke Salvia, MD;  Location: [facility] OR;  Service: Cardiovascular;  Laterality: N/A;  To be completed in Neuro OR 33 time slot 0830 12/20  . Cardioversion 05/27/2011    Procedure: CARDIOVERSION;  Surgeon: Duke Salvia, MD;  Location: [facility] OR;  Service: Cardiovascular;  Laterality: N/A;    Current Outpatient Prescriptions  Medication Sig Dispense Refill  . B Complex-C (B-COMPLEX WITH VITAMIN C) tablet Take 1 tablet by mouth daily.        . Cholecalciferol (VITAMIN D) 1000 UNITS capsule Take 1,000 Units by mouth daily.       Marland Kitchen diltiazem (CARDIZEM CD) 240 MG 24 hr capsule Take one capsule by mouth twice daily.  60 capsule  6  . Grape Seed 100 MG CAPS Take 1  capsule by mouth daily.       . Omega-3 Fatty Acids (FISH OIL) 1000 MG CAPS Take 1,000 mg by mouth daily.       . ramipril (ALTACE) 10 MG capsule Take 1 tablet by mouth daily.      Marland Kitchen warfarin (COUMADIN) 5 MG tablet Take as directed by anticoagulation clinic  90 tablet  1  . zolpidem (AMBIEN) 10 MG tablet Take 1 tablet (10 mg total) by mouth at bedtime. For sleep  30 tablet  5   Current Facility-Administered Medications  Medication Dose Route Frequency Provider Last Rate Last Dose  . 0.9 %  sodium chloride infusion  250 mL Intravenous Continuous [name], PA        Allergies  Allergen Reactions  . Procaine Hcl     Rapid heart rate    Review of Systems negative except from HPI and PMH  Physical Exam BP 136/72  Pulse 64  Ht 5\' 2"  (1.575 m)  Wt 123 lb (55.792 kg)  BMI 22.50 kg/m2  SpO2 98%  Well developed and well nourished in no acute distress HENT normal E scleral and icterus clear Neck Supple JVP flat; carotids brisk and full Clear to ausculation Rapid Regular rate and rhythm, no murmurs gallops or rub Soft with active  bowel sounds No clubbing cyanosis none Edema Alert and oriented, grossly normal motor and sensory function Skin Warm and Dry    Assessment and  Plan

## 2011-09-24 NOTE — Assessment & Plan Note (Signed)
Currently well controlled.  

## 2011-09-30 ENCOUNTER — Ambulatory Visit (INDEPENDENT_AMBULATORY_CARE_PROVIDER_SITE_OTHER): Payer: Medicare Other | Admitting: Pharmacist

## 2011-09-30 DIAGNOSIS — I4891 Unspecified atrial fibrillation: Secondary | ICD-10-CM

## 2011-09-30 DIAGNOSIS — G459 Transient cerebral ischemic attack, unspecified: Secondary | ICD-10-CM

## 2011-09-30 DIAGNOSIS — Z8679 Personal history of other diseases of the circulatory system: Secondary | ICD-10-CM

## 2011-10-14 ENCOUNTER — Encounter: Payer: Self-pay | Admitting: Internal Medicine

## 2011-10-14 ENCOUNTER — Ambulatory Visit (INDEPENDENT_AMBULATORY_CARE_PROVIDER_SITE_OTHER): Payer: Medicare Other | Admitting: Internal Medicine

## 2011-10-14 ENCOUNTER — Telehealth: Payer: Self-pay | Admitting: Internal Medicine

## 2011-10-14 ENCOUNTER — Ambulatory Visit (INDEPENDENT_AMBULATORY_CARE_PROVIDER_SITE_OTHER): Payer: Medicare Other | Admitting: *Deleted

## 2011-10-14 VITALS — BP 116/72 | HR 69 | Temp 97.8°F | Resp 16 | Wt 121.0 lb

## 2011-10-14 DIAGNOSIS — Z8679 Personal history of other diseases of the circulatory system: Secondary | ICD-10-CM

## 2011-10-14 DIAGNOSIS — R197 Diarrhea, unspecified: Secondary | ICD-10-CM

## 2011-10-14 DIAGNOSIS — I4891 Unspecified atrial fibrillation: Secondary | ICD-10-CM

## 2011-10-14 DIAGNOSIS — G459 Transient cerebral ischemic attack, unspecified: Secondary | ICD-10-CM

## 2011-10-14 NOTE — Telephone Encounter (Signed)
Selena Taylor calling, has been having diarrhea for a month.  No blood in the diarrhea.  Has 2-3 stools a day. Reports on slight, intermittent abdominal pain.  No fever.   Needs to be seen in 24 hours.  No appointments today with Dr. Debby Bud or any other provider in the office.  Can patient come by and leave a stool speciment for testing today?  She can be reached at [phone].

## 2011-10-14 NOTE — Telephone Encounter (Signed)
May add on today

## 2011-10-14 NOTE — Telephone Encounter (Signed)
Lm for patient to call back to schedule for today

## 2011-10-14 NOTE — Telephone Encounter (Signed)
Coming in at 4:00.

## 2011-10-14 NOTE — Patient Instructions (Addendum)
Change in bowel habit with frequent loose stools. Not by medical definition a serious diarrhea (more than 6 watery stools/24 hrs). No evidence by your report of an infectious diarrhea or an inflammatory colitis. This is not food poisoning.   Plan  No change in diet  Bulk laxative of choice, e.g. Metamucil, fibercon, etc, twice a day until bowel habit is back to normal  Add in a probiotic, e.g. Align, Activa, live culture yogurt  Call for increase frequency of stooling, blood or mucus in the stool, abdominal pain or fever.    Diarrhea Infections caused by germs (bacterial) or a virus commonly cause diarrhea. Your caregiver has determined that with time, rest and fluids, the diarrhea should improve. In general, eat normally while drinking more water than usual. Although water may prevent dehydration, it does not contain salt and minerals (electrolytes). Broths, weak tea without caffeine and oral rehydration solutions (ORS) replace fluids and electrolytes. Small amounts of fluids should be taken frequently. Large amounts at one time may not be tolerated. Plain water may be harmful in infants and the elderly. Oral rehydrating solutions (ORS) are available at pharmacies and grocery stores. ORS replace water and important electrolytes in proper proportions. Sports drinks are not as effective as ORS and may be harmful due to sugars worsening diarrhea.  ORS is especially recommended for use in children with diarrhea. As a general guideline for children, replace any new fluid losses from diarrhea and/or vomiting with ORS as follows:   If your child weighs 22 pounds or under (10 kg or less), give 60-120 mL ( -  cup or 2 - 4 ounces) of ORS for each episode of diarrheal stool or vomiting episode.   If your child weighs more than 22 pounds (more than 10 kgs), give 120-240 mL ( - 1 cup or 4 - 8 ounces) of ORS for each diarrheal stool or episode of vomiting.   While correcting for dehydration, children should  eat normally. However, foods high in sugar should be avoided because this may worsen diarrhea. Large amounts of carbonated soft drinks, juice, gelatin desserts and other highly sugared drinks should be avoided.   After correction of dehydration, other liquids that are appealing to the child may be added. Children should drink small amounts of fluids frequently and fluids should be increased as tolerated. Children should drink enough fluids to keep urine clear or pale yellow.   Adults should eat normally while drinking more fluids than usual. Drink small amounts of fluids frequently and increase as tolerated. Drink enough fluids to keep urine clear or pale yellow. Broths, weak decaffeinated tea, lemon lime soft drinks (allowed to go flat) and ORS replace fluids and electrolytes.   Avoid:   Carbonated drinks.   Juice.   Extremely hot or cold fluids.   Caffeine drinks.   Fatty, greasy foods.   Alcohol.   Tobacco.   Too much intake of anything at one time.   Gelatin desserts.   Probiotics are active cultures of beneficial bacteria. They may lessen the amount and number of diarrheal stools in adults. Probiotics can be found in yogurt with active cultures and in supplements.   Wash hands well to avoid spreading bacteria and virus.   Anti-diarrheal medications are not recommended for infants and children.   Only take over-the-counter or prescription medicines for pain, discomfort or fever as directed by your caregiver. Do not give aspirin to children because it may cause Reye's Syndrome.   For adults, ask your caregiver  if you should continue all prescribed and over-the-counter medicines.   If your caregiver has given you a follow-up appointment, it is very important to keep that appointment. Not keeping the appointment could result in a chronic or permanent injury, and disability. If there is any problem keeping the appointment, you must call back to this facility for assistance.  SEEK  IMMEDIATE MEDICAL CARE IF:    You or your child is unable to keep fluids down or other symptoms or problems become worse in spite of treatment.   Vomiting or diarrhea develops and becomes persistent.   There is vomiting of blood or bile (green material).   There is blood in the stool or the stools are black and tarry.   There is no urine output in 6-8 hours or there is only a small amount of very dark urine.   Abdominal pain develops, increases or localizes.   You have a fever.   Your baby is older than 3 months with a rectal temperature of 102 F (38.9 C) or higher.   Your baby is 41 months old or younger with a rectal temperature of 100.4 F (38 C) or higher.   You or your child develops excessive weakness, dizziness, fainting or extreme thirst.   You or your child develops a rash, stiff neck, severe headache or become irritable or sleepy and difficult to awaken.  MAKE SURE YOU:    Understand these instructions.   Will watch your condition.   Will get help right away if you are not doing well or get worse.  Document Released: 01/01/2002 Document Revised: 12/31/2010 Document Reviewed: 11/18/2008 [facility] Patient Information 2012 [place], [place].   Diet for Diarrhea, Adult Having frequent, runny stools (diarrhea) has many causes. Diarrhea may be caused or worsened by food or drink. Diarrhea may be relieved by changing your diet. IF YOU ARE NOT TOLERATING SOLID FOODS:  Drink enough water and fluids to keep your urine clear or pale yellow.   Avoid sugary drinks and sodas as well as milk-based beverages.   Avoid beverages containing caffeine and alcohol.   You may try rehydrating beverages. You can make your own by following this recipe:    tsp table salt.    tsp baking soda.   ? tsp salt substitute (potassium chloride).   1 tbs + 1 tsp sugar.   1 qt water.  As your stools become more solid, you can start eating solid foods. Add foods one at a time. If a certain  food causes your diarrhea to get worse, avoid that food and try other foods. A low fiber, low-fat, and lactose-free diet is recommended. Small, frequent meals may be better tolerated.   Starches  Allowed:  White, [place], and pita breads, plain rolls, buns, bagels. Plain muffins, matzo. Soda, saltine, or graham crackers. Pretzels, melba toast, zwieback. Cooked cereals made with water: cornmeal, farina, cream cereals. Dry cereals: refined corn, wheat, rice. Potatoes prepared any way without skins, refined macaroni, spaghetti, noodles, refined rice.   Avoid:  Bread, rolls, or crackers made with whole wheat, multi-grains, rye, bran seeds, nuts, or coconut. Corn tortillas or taco shells. Cereals containing whole grains, multi-grains, bran, coconut, nuts, or raisins. Cooked or dry oatmeal. Coarse wheat cereals, granola. Cereals advertised as "high-fiber." Potato skins. Whole grain pasta, wild or brown rice. Popcorn. Sweet potatoes/yams. Sweet rolls, doughnuts, waffles, pancakes, sweet breads.  Vegetables  Allowed: Strained tomato and vegetable juices. Most well-cooked and canned vegetables without seeds. Fresh: Tender lettuce, cucumber without  the skin, cabbage, spinach, bean sprouts.   Avoid: Fresh, cooked, or canned: Artichokes, baked beans, beet greens, broccoli, Brussels sprouts, corn, kale, legumes, peas, sweet potatoes. Cooked: Green or red cabbage, spinach. Avoid large servings of any vegetables, because vegetables shrink when cooked, and they contain more fiber per serving than fresh vegetables.  Fruit  Allowed: All fruit juices except prune juice. Cooked or canned: Apricots, applesauce, cantaloupe, cherries, fruit cocktail, grapefruit, grapes, kiwi, mandarin oranges, peaches, pears, plums, watermelon. Fresh: Apples without skin, ripe banana, grapes, cantaloupe, cherries, grapefruit, peaches, oranges, plums. Keep servings limited to  cup or 1 piece.   Avoid: Fresh: Apple with skin, apricots, mango,  pears, raspberries, strawberries. Prune juice, stewed or dried prunes. Dried fruits, raisins, dates. Large servings of all fresh fruits.  Meat and Meat Substitutes  Allowed: Ground or well-cooked tender beef, ham, veal, lamb, pork, or poultry. Eggs, plain cheese. Fish, oysters, shrimp, lobster, other seafoods. Liver, organ meats.   Avoid: Tough, fibrous meats with gristle. Peanut butter, smooth or chunky. Cheese, nuts, seeds, legumes, dried peas, beans, lentils.  Milk  Allowed: Yogurt, lactose-free milk, kefir, drinkable yogurt, buttermilk, soy milk.   Avoid: Milk, chocolate milk, beverages made with milk, such as milk shakes.  Soups  Allowed: Bouillon, broth, or soups made from allowed foods. Any strained soup.   Avoid: Soups made from vegetables that are not allowed, cream or milk-based soups.  Desserts and Sweets  Allowed: Sugar-free gelatin, sugar-free frozen ice pops made without sugar alcohol.   Avoid: Plain cakes and cookies, pie made with allowed fruit, pudding, custard, cream pie. Gelatin, fruit, ice, sherbet, frozen ice pops. Ice cream, ice milk without nuts. Plain hard candy, honey, jelly, molasses, syrup, sugar, chocolate syrup, gumdrops, marshmallows.  Fats and Oils  Allowed: Avoid any fats and oils.   Avoid: Seeds, nuts, olives, avocados. Margarine, butter, cream, mayonnaise, salad oils, plain salad dressings made from allowed foods. Plain gravy, crisp bacon without rind.  Beverages  Allowed: Water, decaffeinated teas, oral rehydration solutions, sugar-free beverages.   Avoid: Fruit juices, caffeinated beverages (coffee, tea, soda or pop), alcohol, sports drinks, or lemon-lime soda or pop.  Condiments  Allowed: Ketchup, mustard, horseradish, vinegar, cream sauce, cheese sauce, cocoa powder. Spices in moderation: allspice, basil, bay leaves, celery powder or leaves, cinnamon, cumin powder, curry powder, ginger, mace, marjoram, onion or garlic powder, oregano, paprika,  parsley flakes, ground pepper, rosemary, sage, savory, tarragon, thyme, turmeric.   Avoid: Coconut, honey.  Weight Monitoring: Weigh yourself every day. You should weigh yourself in the morning after you urinate and before you eat breakfast. Wear the same amount of clothing when you weigh yourself. Record your weight daily. Bring your recorded weights to your clinic visits. Tell your caregiver right away if you have gained 3 lb/1.4 kg or more in 1 day, 5 lb/2.3 kg in a week, or whatever amount you were told to report. SEEK IMMEDIATE MEDICAL CARE IF:    You are unable to keep fluids down.   You start to throw up (vomit) or diarrhea keeps coming back (persistent).   Abdominal pain develops, increases, or can be felt in one place (localizes).   You have an oral temperature above 102 F (38.9 C), not controlled by medicine.   Diarrhea contains blood or mucus.   You develop excessive weakness, dizziness, fainting, or extreme thirst.  MAKE SURE YOU:    Understand these instructions.   Will watch your condition.   Will get help right away if you are  not doing well or get worse.  Document Released: 04/03/2003 Document Revised: 12/31/2010 Document Reviewed: 07/25/2008 [facility] Patient Information 2012 [place], [place].

## 2011-10-17 NOTE — Progress Notes (Signed)
  Subjective:    Patient ID: Selena Taylor, female    DOB: 09-04-1935, 76 y.o.   MRN: [id number]  HPI Mrs. Calvillo presents for evaluation of changed bowel habit. She reports that for several days she has had loose, watery stools approximately 3 times a day. She did have a seafood meal prior to change in bowels but did not have any immediate symptoms. She denies any blood or mucus in the stool, denies fever or abdominal pain. She feels she has maintained good hydration.  PMH, FamHx and SocHx reviewed for any changes and relevance.  Current Outpatient Prescriptions on File Prior to Visit  Medication Sig Dispense Refill  . B Complex-C (B-COMPLEX WITH VITAMIN C) tablet Take 1 tablet by mouth daily.        . Cholecalciferol (VITAMIN D) 1000 UNITS capsule Take 1,000 Units by mouth daily.       Marland Kitchen diltiazem (CARDIZEM CD) 240 MG 24 hr capsule Take one capsule by mouth twice daily.  60 capsule  6  . Grape Seed 100 MG CAPS Take 1 capsule by mouth daily.       . Omega-3 Fatty Acids (FISH OIL) 1000 MG CAPS Take 1,000 mg by mouth daily.       . ramipril (ALTACE) 10 MG capsule Take 1 tablet by mouth daily.      Marland Kitchen warfarin (COUMADIN) 5 MG tablet Take as directed by anticoagulation clinic  90 tablet  1  . zolpidem (AMBIEN) 10 MG tablet Take 1 tablet (10 mg total) by mouth at bedtime. For sleep  30 tablet  5   Current Facility-Administered Medications on File Prior to Visit  Medication Dose Route Frequency Provider Last Rate Last Dose  . 0.9 %  sodium chloride infusion  250 mL Intravenous Continuous [name], PA          Review of Systems System review is negative for any constitutional, cardiac, pulmonary, GI or neuro symptoms or complaints other than as described in the HPI.     Objective:   Physical Exam Filed Vitals:   10/14/11 1604  BP: 116/72  Pulse: 69  Temp: 97.8 F (36.6 C)  Resp: 16   Gen'l- WNWD fair skinned white woman in no distress HEENT_ C&S clear Cor- 2+ radial pulse,  regular Pulm - normal respirations Abd - BS+, soft w/o guarding or rebound       Assessment & Plan:  Change in Bowel habit - minor diarrhea with no sign or evidence of infectious etiology.  Plan  Adequate hydration  Trial of bulk laxative of choice.  Call for persistent symptoms, fever, abdominal pain or blood/mucus in stool.

## 2011-10-28 ENCOUNTER — Ambulatory Visit (INDEPENDENT_AMBULATORY_CARE_PROVIDER_SITE_OTHER): Payer: Medicare Other | Admitting: *Deleted

## 2011-10-28 DIAGNOSIS — Z8679 Personal history of other diseases of the circulatory system: Secondary | ICD-10-CM

## 2011-10-28 DIAGNOSIS — I4891 Unspecified atrial fibrillation: Secondary | ICD-10-CM

## 2011-10-28 DIAGNOSIS — G459 Transient cerebral ischemic attack, unspecified: Secondary | ICD-10-CM

## 2011-10-29 ENCOUNTER — Telehealth: Payer: Self-pay | Admitting: Internal Medicine

## 2011-10-29 NOTE — Telephone Encounter (Signed)
New Problem:    Patient called in wanting to know if she could take something with acetaminophen in it.  Please call back.

## 2011-10-29 NOTE — Telephone Encounter (Signed)
Telephoned pt and discussed interactions and Tylenol use.  Her questions were answered.

## 2011-11-15 ENCOUNTER — Ambulatory Visit (INDEPENDENT_AMBULATORY_CARE_PROVIDER_SITE_OTHER): Payer: Medicare Other | Admitting: *Deleted

## 2011-11-15 DIAGNOSIS — I4891 Unspecified atrial fibrillation: Secondary | ICD-10-CM

## 2011-11-15 DIAGNOSIS — Z8679 Personal history of other diseases of the circulatory system: Secondary | ICD-10-CM

## 2011-11-15 DIAGNOSIS — G459 Transient cerebral ischemic attack, unspecified: Secondary | ICD-10-CM

## 2011-11-18 ENCOUNTER — Ambulatory Visit (INDEPENDENT_AMBULATORY_CARE_PROVIDER_SITE_OTHER): Payer: Medicare Other | Admitting: *Deleted

## 2011-11-18 DIAGNOSIS — Z23 Encounter for immunization: Secondary | ICD-10-CM

## 2011-11-25 ENCOUNTER — Other Ambulatory Visit: Payer: Self-pay | Admitting: Internal Medicine

## 2011-11-25 DIAGNOSIS — Z1231 Encounter for screening mammogram for malignant neoplasm of breast: Secondary | ICD-10-CM

## 2011-11-30 ENCOUNTER — Ambulatory Visit (INDEPENDENT_AMBULATORY_CARE_PROVIDER_SITE_OTHER): Payer: Medicare Other | Admitting: *Deleted

## 2011-11-30 DIAGNOSIS — I4891 Unspecified atrial fibrillation: Secondary | ICD-10-CM

## 2011-11-30 DIAGNOSIS — G459 Transient cerebral ischemic attack, unspecified: Secondary | ICD-10-CM

## 2011-11-30 DIAGNOSIS — Z8679 Personal history of other diseases of the circulatory system: Secondary | ICD-10-CM

## 2011-12-14 ENCOUNTER — Ambulatory Visit (INDEPENDENT_AMBULATORY_CARE_PROVIDER_SITE_OTHER): Payer: Medicare Other | Admitting: Pharmacist

## 2011-12-14 DIAGNOSIS — Z8679 Personal history of other diseases of the circulatory system: Secondary | ICD-10-CM

## 2011-12-14 DIAGNOSIS — I4891 Unspecified atrial fibrillation: Secondary | ICD-10-CM

## 2011-12-14 DIAGNOSIS — G459 Transient cerebral ischemic attack, unspecified: Secondary | ICD-10-CM

## 2011-12-27 ENCOUNTER — Ambulatory Visit (INDEPENDENT_AMBULATORY_CARE_PROVIDER_SITE_OTHER): Payer: Medicare Other | Admitting: Internal Medicine

## 2011-12-27 ENCOUNTER — Encounter: Payer: Self-pay | Admitting: Internal Medicine

## 2011-12-27 ENCOUNTER — Ambulatory Visit (INDEPENDENT_AMBULATORY_CARE_PROVIDER_SITE_OTHER)
Admission: RE | Admit: 2011-12-27 | Discharge: 2011-12-27 | Disposition: A | Discharge status: Home / Self Care | Payer: Medicare Other | Source: Ambulatory Visit | Attending: Internal Medicine | Admitting: Internal Medicine

## 2011-12-27 ENCOUNTER — Other Ambulatory Visit (INDEPENDENT_AMBULATORY_CARE_PROVIDER_SITE_OTHER): Payer: Medicare Other

## 2011-12-27 VITALS — BP 122/68 | HR 77 | Temp 97.9°F | Resp 10 | Wt 125.1 lb

## 2011-12-27 DIAGNOSIS — M79604 Pain in right leg: Secondary | ICD-10-CM

## 2011-12-27 DIAGNOSIS — M79609 Pain in unspecified limb: Secondary | ICD-10-CM

## 2011-12-27 DIAGNOSIS — I4891 Unspecified atrial fibrillation: Secondary | ICD-10-CM

## 2011-12-27 IMAGING — CR DG LUMBAR SPINE COMPLETE 4+V
5 series · 5 of 5 positions shown · non-contrast
Comparison: None.

CLINICAL DATA: Bilateral buttock and thigh pain.  Rule out
degenerative disc disease/degenerative joint disease.

LUMBAR SPINE - COMPLETE 4+ VIEW

[view not recorded (1 of 5)]
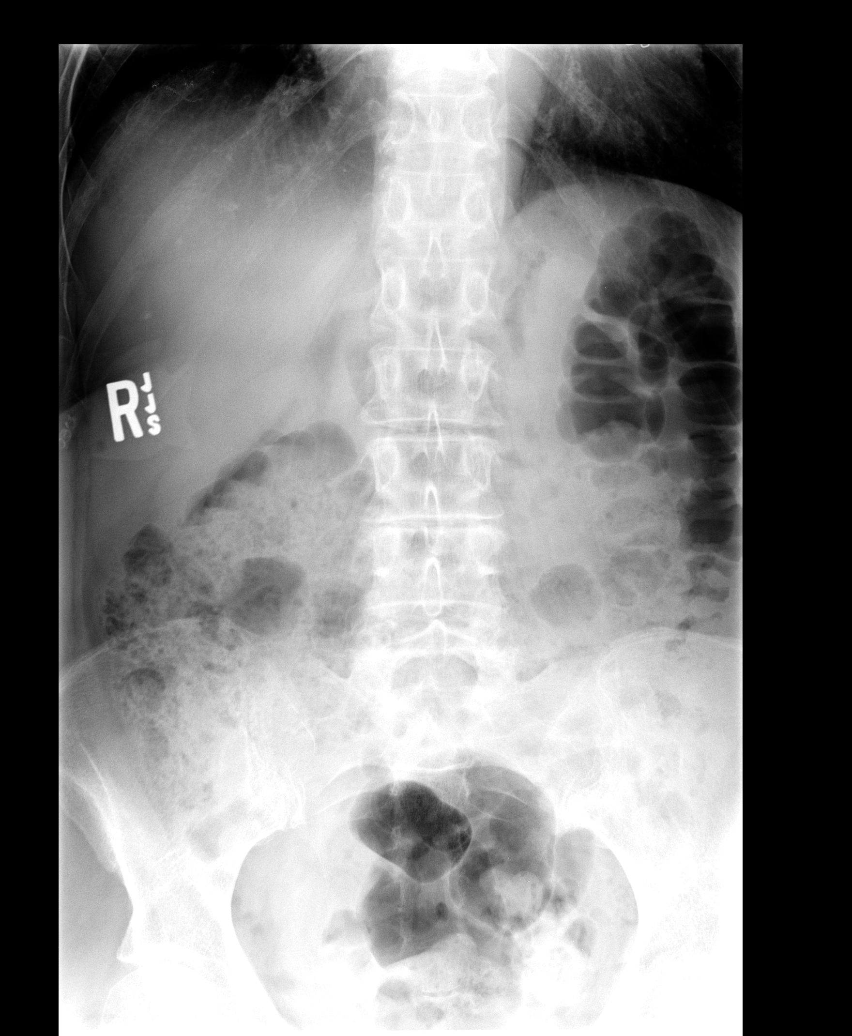

[view not recorded (2 of 5)]
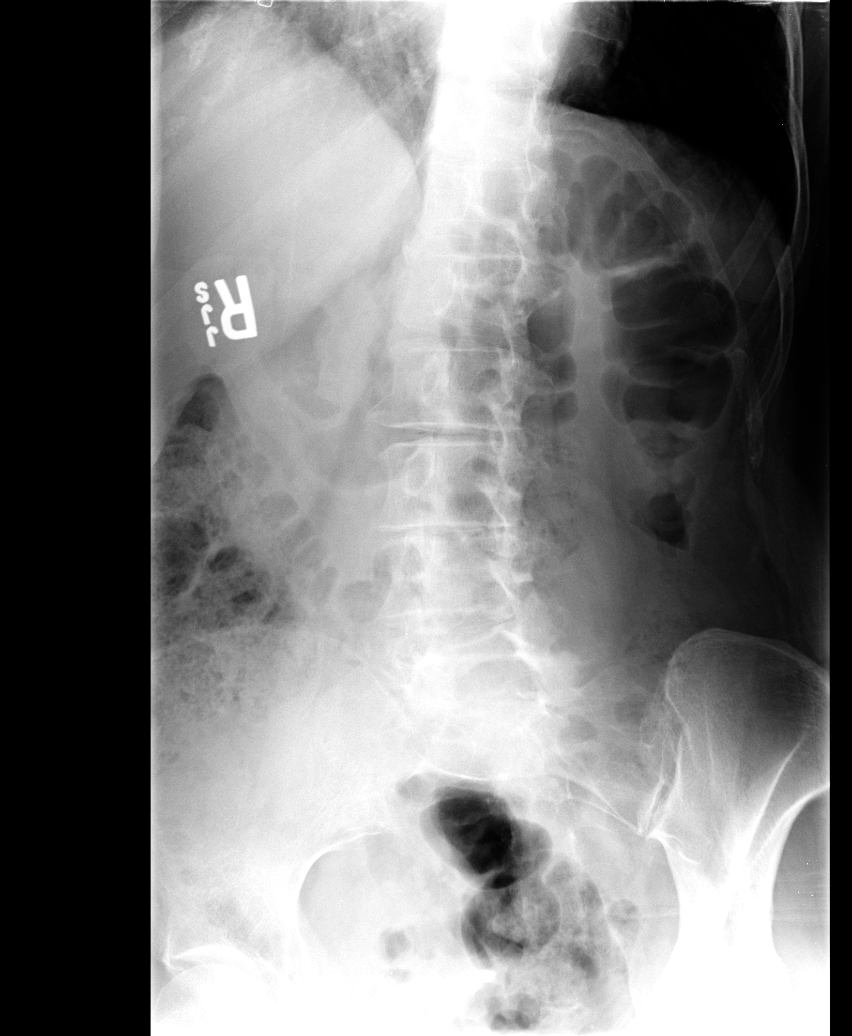

[view not recorded (3 of 5)]
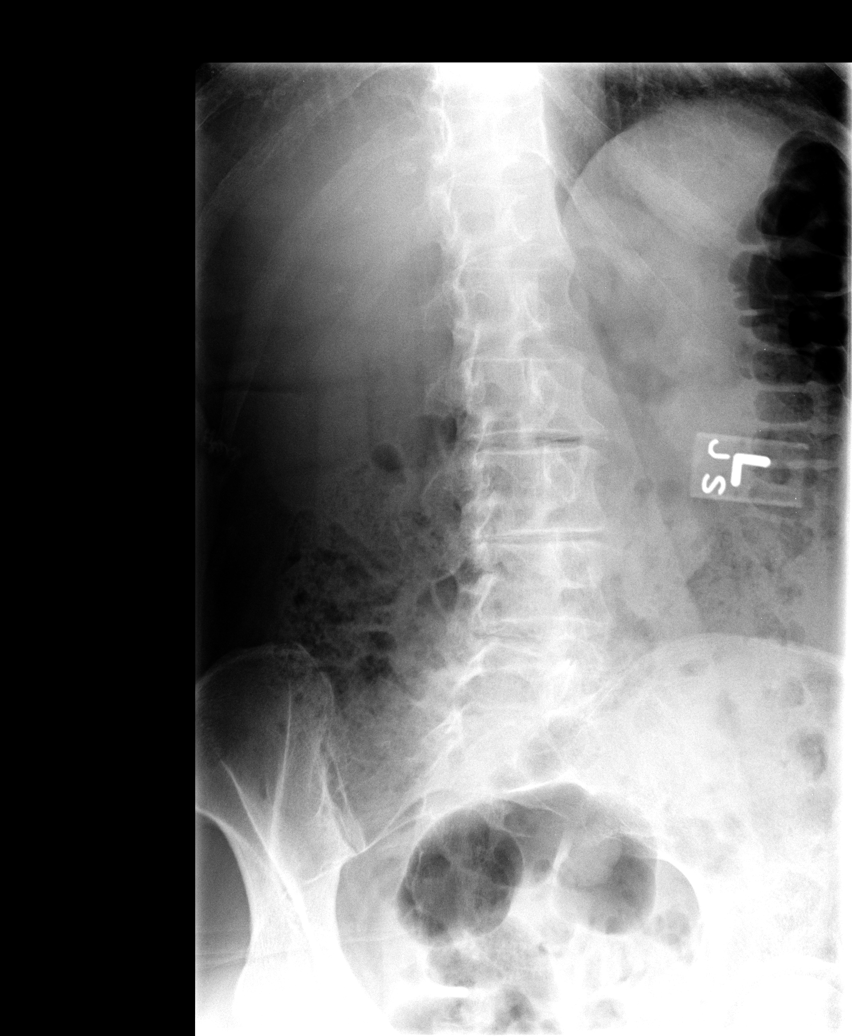

[view not recorded (4 of 5)]
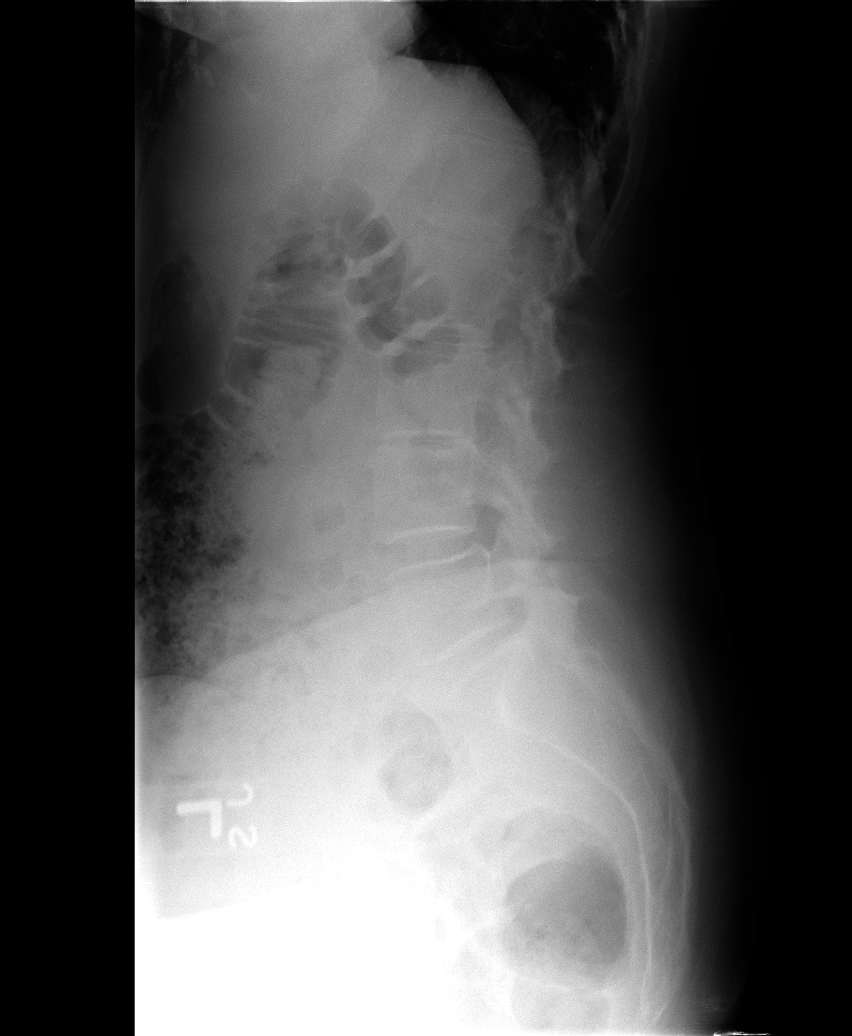

[view not recorded (5 of 5)]
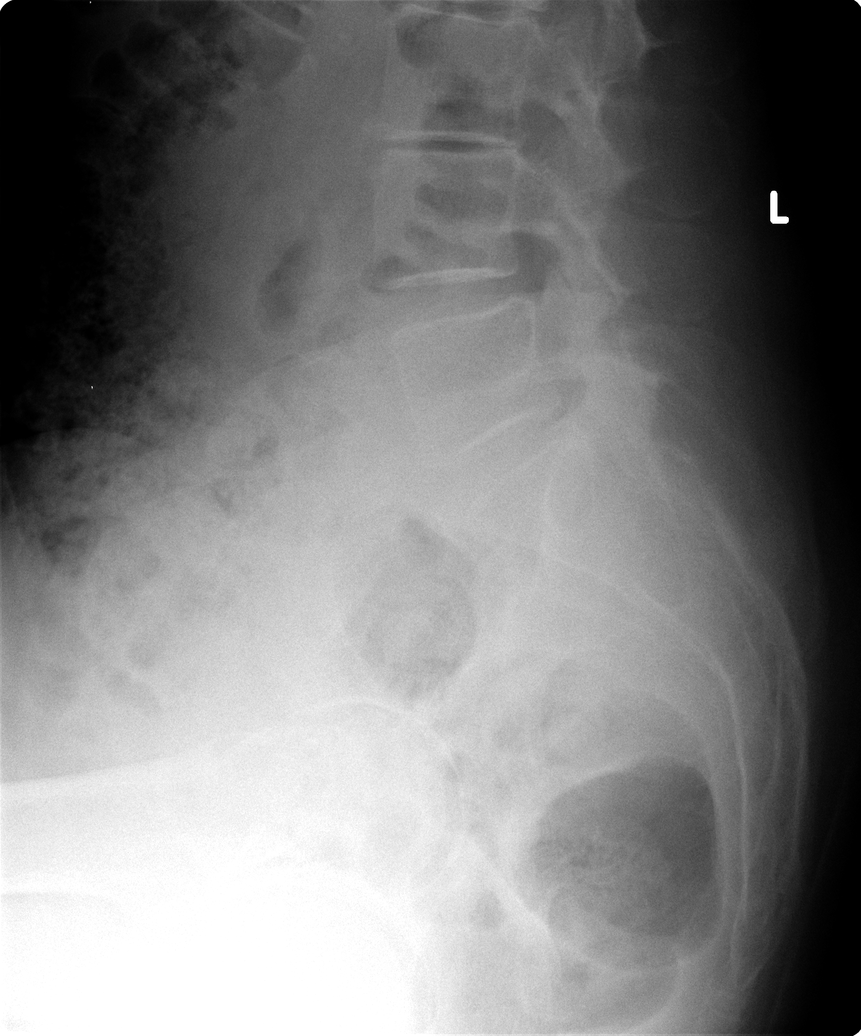

[5 of 5 positions shown; findings below may reference images not displayed]

FINDINGS: Vertebral body alignment and heights are within normal.
There is mild spondylosis.  There is moderate disc space narrowing
at the L3-4 level. There is mild disc space narrowing at the L2-3
level.  Facet arthropathy is present over the mid to lower lumbar
spine.
IMPRESSION: Mild spondylosis with degenerative disc disease at the L3-4 level
greater than the L2-3 level.  No acute findings.

## 2011-12-27 NOTE — Patient Instructions (Addendum)
Thank you for coming to see [place]. We're not yet sure what's causing your symptoms. Here is our visit summary and recommendations:  Today 1. X-ray: Plain lumbar spine films 2. Labs: Sedimentation rate (ESR), creatine kinase (CK-total)  At Home 1. Take Tylenol up to 3000 mg daily 2. Stretching, neck rolls, massage 3. Can resume walking for exercise 4. We may consider physical therapy if symptoms do not improve

## 2011-12-27 NOTE — Progress Notes (Signed)
Patient ID: Selena Taylor, female   DOB: Jun 24, 1935, 76 y.o.   MRN: [id number]  HPI Selena Taylor is a 76 year old female who presents with muscle pain of 2 months' duration. She has been experiencing pain in her leg, arms, and neck. The neck pain is sharp, and the pain in her arms and legs is achy.  She did yoga over the summer and attributes the start of her pains to this. The pain started at the end of September with both upper legs and buttocks, a wrenching pain with getting up from a seated position or walking up stairs. She now feels it specifically in her hamstrings bilaterally. She denies any back pain.  The pain in her left neck just started a couple of weeks ago. There is pain in both arms that occurs at night only and also been present only for the last couple of weeks.  The patient is on coumadin and has been taking tylenol every 6 hours as needed. She has tried heat pads for the backs of her legs and they help some.   She has been taking ramipril 10 mg for 4-5 years. No changes in dose. Her only recent medication dose change is diltiazem to 240 mg BID which was increased 6 months to a year ago.  She has not noticed any blood in her urine.  She walks for exercise 2 miles but has not in the last month, although she does not note any pain when walking for function. The pain is worse when getting up from a seated position or walking up stairs.   ROS -Denies fever, chills, chest pain, changes in weight -Denies palpitations   Past Medical History  Diagnosis Date  . TIA (transient ischemic attack)   . PAF (paroxysmal atrial fibrillation)     has been on Flecainide in the past. Stopped 02/10/11; Remains on coumadin anticoagulation; intol to Rythmol and failed DCCV; noted to be in AFlutter 5/13;  Echo 01/2011:  EF 55-60%, mild MR, mild LAE.  Myoview 6/12:  No ischemia, EF 74%  . HTN (hypertension)   . Gross hematuria   . Dysphagia   . Endometriosis   . Osteopenia   . Insomnia   . Chronic  anticoagulation     on coumadin  . Dizziness     improved with Meclizine   Past Surgical History  Procedure Date  . Polypectomy   . Total hysterectomy and bilateral salpingoopherectomy   . Appendectomy   . Skin cancer removal   . Cardioversion 01/14/2011    Procedure: CARDIOVERSION;  Surgeon: Duke Salvia, MD;  Location: [facility] OR;  Service: Cardiovascular;  Laterality: N/A;  To be completed in Neuro OR 33 time slot 0830 12/20  . Cardioversion 05/27/2011    Procedure: CARDIOVERSION;  Surgeon: Duke Salvia, MD;  Location: [facility] OR;  Service: Cardiovascular;  Laterality: N/A;   History reviewed. No pertinent family history. History   Social History  . Marital Status: Married    Spouse Name: N/A    Number of Children: N/A  . Years of Education: N/A   Occupational History  . Not on file.   Social History Main Topics  . Smoking status: Never Smoker   . Smokeless tobacco: Not on file  . Alcohol Use: 8.4 oz/week    14 Glasses of wine per week     Comment: 1-2 glasses of wine nightly  . Drug Use: No  . Sexually Active: Not Currently   Other Topics Concern  .  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   Current Outpatient Prescriptions on File Prior to Visit  Medication Sig Dispense Refill  . B Complex-C (B-COMPLEX WITH VITAMIN C) tablet Take 1 tablet by mouth daily.        . Cholecalciferol (VITAMIN D) 1000 UNITS capsule Take 1,000 Units by mouth daily.       Marland Kitchen diltiazem (CARDIZEM CD) 240 MG 24 hr capsule Take one capsule by mouth twice daily.  60 capsule  6  . Grape Seed 100 MG CAPS Take 1 capsule by mouth daily.       . Omega-3 Fatty Acids (FISH OIL) 1000 MG CAPS Take 1,000 mg by mouth daily.       . ramipril (ALTACE) 10 MG capsule Take 1 tablet by mouth daily.      Marland Kitchen warfarin  (COUMADIN) 5 MG tablet Take as directed by anticoagulation clinic  90 tablet  1  . zolpidem (AMBIEN) 10 MG tablet Take 1 tablet (10 mg total) by mouth at bedtime. For sleep  30 tablet  5   Current Facility-Administered Medications on File Prior to Visit  Medication Dose Route Frequency Provider Last Rate Last Dose  . 0.9 %  sodium chloride infusion  250 mL Intravenous Continuous [name], PA       PE General: Pleasant lady in NAD CV: Regular rate and rhythm, S1 and S2 present. Pulse regular. Distal pulses palpable. Pulm: Lungs clear to auscultation bilaterally MSK: Strength 4/5, limited by pain in biceps bilateral UEs and hamstrings bilateral LEs. Grip strength 5/5. Neuro: Alert and appropriate. No focal deficits. Reflexes 2+.  A/P Selena Taylor is a 76 year old female who presents with muscle pain.   #Muscle Pain -Lumbar sacral spine films today -Labs: sedimentation rate (ESR), creatine kinase (CK) -Take Tylenol up to 3000 mg daily -Stretching, neck rolls, massage -Can resume walking for exercise -May consider physical therapy if symptoms do not improve  Patient seen and examined. Diagnosis unclear. Agree with studies as ordered with close follow up.  M.Norins,MD

## 2011-12-28 ENCOUNTER — Ambulatory Visit (INDEPENDENT_AMBULATORY_CARE_PROVIDER_SITE_OTHER): Payer: Medicare Other | Admitting: *Deleted

## 2011-12-28 DIAGNOSIS — G459 Transient cerebral ischemic attack, unspecified: Secondary | ICD-10-CM

## 2011-12-28 DIAGNOSIS — I4891 Unspecified atrial fibrillation: Secondary | ICD-10-CM

## 2011-12-28 DIAGNOSIS — Z8679 Personal history of other diseases of the circulatory system: Secondary | ICD-10-CM

## 2011-12-28 LAB — SEDIMENTATION RATE: Sed Rate: 57 mm/hr — ABNORMAL HIGH (ref 0–22)

## 2011-12-30 ENCOUNTER — Other Ambulatory Visit: Payer: Self-pay

## 2011-12-30 DIAGNOSIS — I4891 Unspecified atrial fibrillation: Secondary | ICD-10-CM

## 2011-12-30 MED ORDER — DILTIAZEM HCL ER COATED BEADS 240 MG PO CP24: ORAL_CAPSULE | ORAL | Status: DC

## 2012-01-04 ENCOUNTER — Encounter: Payer: Self-pay | Admitting: Internal Medicine

## 2012-01-05 ENCOUNTER — Other Ambulatory Visit: Payer: Self-pay | Admitting: *Deleted

## 2012-01-05 ENCOUNTER — Ambulatory Visit
Admission: RE | Admit: 2012-01-05 | Discharge: 2012-01-05 | Disposition: A | Discharge status: Home / Self Care | Payer: Medicare Other | Source: Ambulatory Visit | Attending: Internal Medicine | Admitting: Internal Medicine

## 2012-01-05 DIAGNOSIS — Z1231 Encounter for screening mammogram for malignant neoplasm of breast: Secondary | ICD-10-CM

## 2012-01-07 ENCOUNTER — Other Ambulatory Visit: Payer: Self-pay | Admitting: *Deleted

## 2012-01-08 MED ORDER — ZOLPIDEM TARTRATE 10 MG PO TABS: 10.0000 mg | ORAL_TABLET | Freq: Every day | ORAL | Status: DC

## 2012-01-12 ENCOUNTER — Encounter: Payer: Self-pay | Admitting: Internal Medicine

## 2012-01-12 ENCOUNTER — Ambulatory Visit (INDEPENDENT_AMBULATORY_CARE_PROVIDER_SITE_OTHER): Payer: Medicare Other | Admitting: Internal Medicine

## 2012-01-12 VITALS — BP 124/78 | Wt 125.1 lb

## 2012-01-12 DIAGNOSIS — IMO0001 Reserved for inherently not codable concepts without codable children: Secondary | ICD-10-CM

## 2012-01-12 DIAGNOSIS — M791 Myalgia, unspecified site: Secondary | ICD-10-CM

## 2012-01-12 MED ORDER — PREDNISONE 10 MG PO TABS: 10.0000 mg | ORAL_TABLET | Freq: Every day | ORAL | Status: DC

## 2012-01-12 NOTE — Patient Instructions (Addendum)
Muscle pain arms and thighs - Sed Rate at 57 is abnormal and indicates non-specific inflammation. The ANA being negative rules out connective tissue disease, e.g. Lupus.  Plan - A burst and taper of prednisone, an anti-inflammatory drug.  If no relief or if recurrent symptoms as the dose of prednisone tapers down will need to refer to a rheumatologist.  Have a Great holiday.

## 2012-01-16 NOTE — Progress Notes (Signed)
  Subjective:    Patient ID: Selena Taylor, female    DOB: 02-Apr-1935, 76 y.o.   MRN: [id number]  HPI Selena Taylor has been having myalgias in the upper and lower extremities. Her evaluation to date has been negative except for an elevated ESR. She continues to have discomfort but denies frank weakness. She has had no fever or chills, no bruising. She gets no relief from APAP and due to beign on coumadin cannot take NSAIDs.   PMH, FamHx and SocHx reviewed for any changes and relevance. Current Outpatient Prescriptions on File Prior to Visit  Medication Sig Dispense Refill  . B Complex-C (B-COMPLEX WITH VITAMIN C) tablet Take 1 tablet by mouth daily.        . Cholecalciferol (VITAMIN D) 1000 UNITS capsule Take 1,000 Units by mouth daily.       Marland Kitchen diltiazem (CARDIZEM CD) 240 MG 24 hr capsule Take one capsule by mouth twice daily.  60 capsule  6  . Grape Seed 100 MG CAPS Take 1 capsule by mouth daily.       . Omega-3 Fatty Acids (FISH OIL) 1000 MG CAPS Take 1,000 mg by mouth daily.       . ramipril (ALTACE) 10 MG capsule Take 1 tablet by mouth daily.      Marland Kitchen warfarin (COUMADIN) 5 MG tablet Take as directed by anticoagulation clinic  90 tablet  1  . zolpidem (AMBIEN) 10 MG tablet Take 1 tablet (10 mg total) by mouth at bedtime. For sleep  30 tablet  5   Current Facility-Administered Medications on File Prior to Visit  Medication Dose Route Frequency Provider Last Rate Last Dose  . 0.9 %  sodium chloride infusion  250 mL Intravenous Continuous [name], PA          Review of Systems System review is negative for any constitutional, cardiac, pulmonary, GI or neuro symptoms or complaints other than as described in the HPI.     Objective:   Physical Exam Filed Vitals:   01/12/12 1544  BP: 124/78   Gen'l - WNWD very fair-skinned woman in no distress. HEENT- C&S clear Cor- IRIR rate controlled PUlm - normal respirations MSK - full ROM, normal gait and station.  Lab Results  Component  Value Date   WBC 6.7 05/27/2011   HGB 13.7 05/27/2011   HCT 40.5 05/27/2011   PLT 210 05/27/2011   GLUCOSE 96 05/27/2011   ALT 17 12/22/2010   AST 24 12/22/2010   NA 139 05/27/2011   K 4.8 05/27/2011   CL 106 05/27/2011   CREATININE 0.64 05/27/2011   BUN 14 05/27/2011   CO2 25 05/27/2011   TSH 1.196 05/02/2011   INR 2.6 12/28/2011   ANA - negative ESR 57       Assessment & Plan:  Muscle pain arms and thighs - Sed Rate at 57 is abnormal and indicates non-specific inflammation. The ANA being negative rules out connective tissue disease, e.g. Lupus.  Plan - A burst and taper of prednisone, an anti-inflammatory drug.  If no relief or if recurrent symptoms as the dose of prednisone tapers down will need to refer to a rheumatologist.

## 2012-01-17 ENCOUNTER — Ambulatory Visit (INDEPENDENT_AMBULATORY_CARE_PROVIDER_SITE_OTHER): Payer: Medicare Other

## 2012-01-17 DIAGNOSIS — Z8679 Personal history of other diseases of the circulatory system: Secondary | ICD-10-CM

## 2012-01-17 DIAGNOSIS — G459 Transient cerebral ischemic attack, unspecified: Secondary | ICD-10-CM

## 2012-01-17 DIAGNOSIS — I4891 Unspecified atrial fibrillation: Secondary | ICD-10-CM

## 2012-02-05 ENCOUNTER — Emergency Department (HOSPITAL_COMMUNITY): Payer: Medicare Other

## 2012-02-05 ENCOUNTER — Inpatient Hospital Stay (HOSPITAL_COMMUNITY)
Admission: EM | Admit: 2012-02-05 | Discharge: 2012-02-06 | DRG: 315 | Disposition: A | Discharge status: Home / Self Care | Payer: Medicare Other | Attending: Internal Medicine | Admitting: Internal Medicine

## 2012-02-05 ENCOUNTER — Encounter (HOSPITAL_COMMUNITY): Payer: Self-pay | Admitting: Emergency Medicine

## 2012-02-05 ENCOUNTER — Inpatient Hospital Stay (HOSPITAL_COMMUNITY): Payer: Medicare Other

## 2012-02-05 DIAGNOSIS — R0789 Other chest pain: Secondary | ICD-10-CM

## 2012-02-05 DIAGNOSIS — I10 Essential (primary) hypertension: Secondary | ICD-10-CM | POA: Diagnosis present

## 2012-02-05 DIAGNOSIS — Z8673 Personal history of transient ischemic attack (TIA), and cerebral infarction without residual deficits: Secondary | ICD-10-CM

## 2012-02-05 DIAGNOSIS — M791 Myalgia, unspecified site: Secondary | ICD-10-CM

## 2012-02-05 DIAGNOSIS — R0781 Pleurodynia: Secondary | ICD-10-CM

## 2012-02-05 DIAGNOSIS — I4891 Unspecified atrial fibrillation: Secondary | ICD-10-CM | POA: Diagnosis present

## 2012-02-05 DIAGNOSIS — Z90710 Acquired absence of both cervix and uterus: Secondary | ICD-10-CM

## 2012-02-05 DIAGNOSIS — I319 Disease of pericardium, unspecified: Secondary | ICD-10-CM

## 2012-02-05 DIAGNOSIS — Z888 Allergy status to other drugs, medicaments and biological substances status: Secondary | ICD-10-CM

## 2012-02-05 DIAGNOSIS — M316 Other giant cell arteritis: Secondary | ICD-10-CM | POA: Diagnosis present

## 2012-02-05 DIAGNOSIS — R079 Chest pain, unspecified: Secondary | ICD-10-CM

## 2012-02-05 DIAGNOSIS — Z7901 Long term (current) use of anticoagulants: Secondary | ICD-10-CM

## 2012-02-05 DIAGNOSIS — IMO0001 Reserved for inherently not codable concepts without codable children: Secondary | ICD-10-CM | POA: Diagnosis present

## 2012-02-05 DIAGNOSIS — I4892 Unspecified atrial flutter: Secondary | ICD-10-CM | POA: Diagnosis present

## 2012-02-05 DIAGNOSIS — M899 Disorder of bone, unspecified: Secondary | ICD-10-CM | POA: Diagnosis present

## 2012-02-05 DIAGNOSIS — IMO0002 Reserved for concepts with insufficient information to code with codable children: Secondary | ICD-10-CM

## 2012-02-05 DIAGNOSIS — Z9089 Acquired absence of other organs: Secondary | ICD-10-CM

## 2012-02-05 DIAGNOSIS — Z9079 Acquired absence of other genital organ(s): Secondary | ICD-10-CM

## 2012-02-05 LAB — CBC WITH DIFFERENTIAL/PLATELET
Lymphocytes Relative: 11 % — ABNORMAL LOW (ref 12–46)
Lymphs Abs: 0.9 10*3/uL (ref 0.7–4.0)
MCV: 94.7 fL (ref 78.0–100.0)
Neutrophils Relative %: 80 % — ABNORMAL HIGH (ref 43–77)
Platelets: 245 10*3/uL (ref 150–400)
RBC: 3.77 MIL/uL — ABNORMAL LOW (ref 3.87–5.11)
WBC: 8.3 10*3/uL (ref 4.0–10.5)

## 2012-02-05 LAB — POCT I-STAT, CHEM 8
Chloride: 107 mEq/L (ref 96–112)
HCT: 34 % — ABNORMAL LOW (ref 36.0–46.0)
Potassium: 4 mEq/L (ref 3.5–5.1)

## 2012-02-05 LAB — PROTIME-INR
INR: 2.62 — ABNORMAL HIGH (ref 0.00–1.49)
Prothrombin Time: 26.7 seconds — ABNORMAL HIGH (ref 11.6–15.2)

## 2012-02-05 LAB — SEDIMENTATION RATE: Sed Rate: 42 mm/hr — ABNORMAL HIGH (ref 0–22)

## 2012-02-05 LAB — TROPONIN I: Troponin I: <0.3 ng/mL (ref <0.30)

## 2012-02-05 LAB — POCT I-STAT TROPONIN I: Troponin i, poc: 0 ng/mL (ref 0.00–0.08)

## 2012-02-05 IMAGING — CR DG CHEST 1V PORT
1 series · 1 of 1 positions shown · non-contrast
Comparison: [DATE]

CLINICAL DATA: Shortness of breath, chest pain.

PORTABLE CHEST - 1 VIEW

[AP]
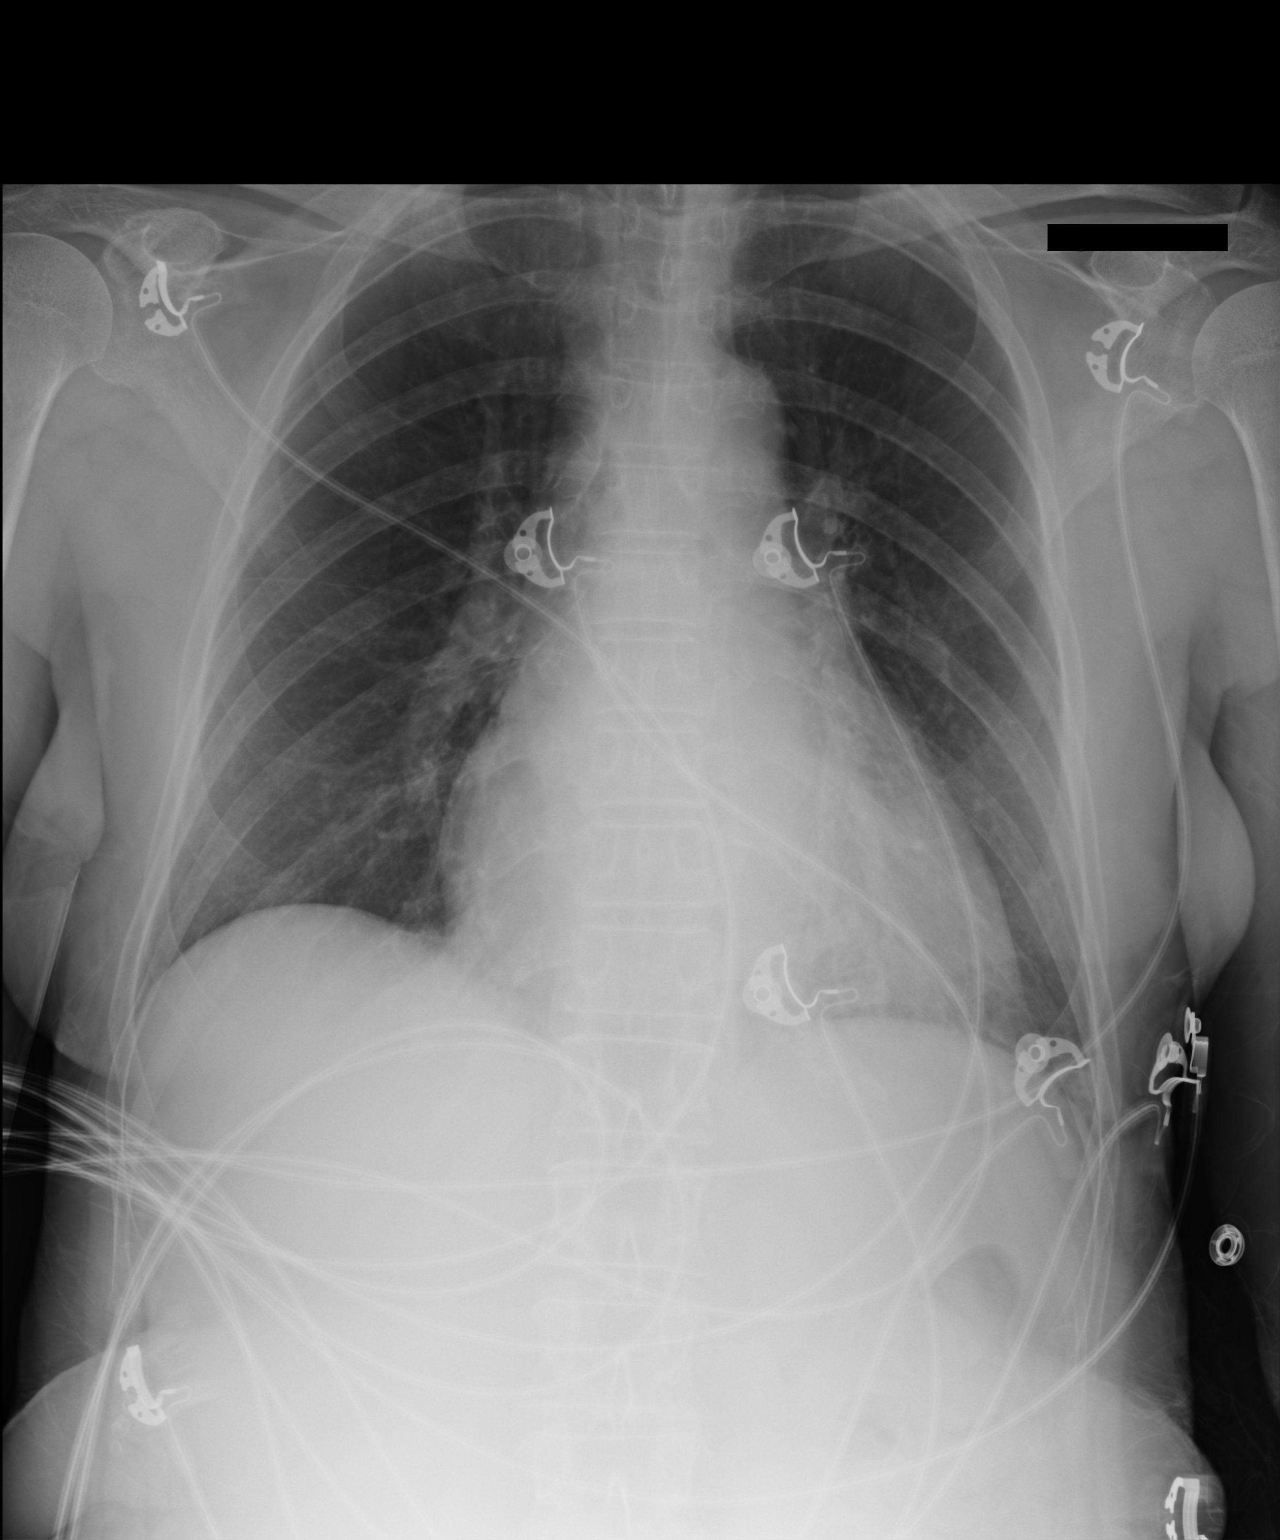

[1 of 1 positions shown; findings below may reference images not displayed]

FINDINGS: Mild cardiomegaly and hyperinflation.  No confluent
airspace opacities or effusions.  No acute bony abnormality.
IMPRESSION: Cardiomegaly, hyperinflation.

## 2012-02-05 IMAGING — CT CT ANGIO CHEST
2 of 6 series · 19 of 46 positions shown · IV contrast (APPLIED)
Comparison: Chest radiograph [DATE] CT abdomen [DATE]

CLINICAL DATA: Back pain, concern pulmonary embolism.  Short of
breath

CT ANGIOGRAPHY CHEST
TECHNIQUE: Multidetector CT imaging of the chest using the
standard protocol during bolus administration of intravenous
contrast. Multiplanar reconstructed images including MIPs were
obtained and reviewed to evaluate the vascular anatomy.
Contrast: 100mL OMNIPAQUE IOHEXOL 350 MG/ML SOLN

[Series 6: pulm embolism 1.0 b25f thin · axial · 0.56mm/px · z∈[-168,+94]mm · 16 of 288 slices shown]
[im 13/288  lung]
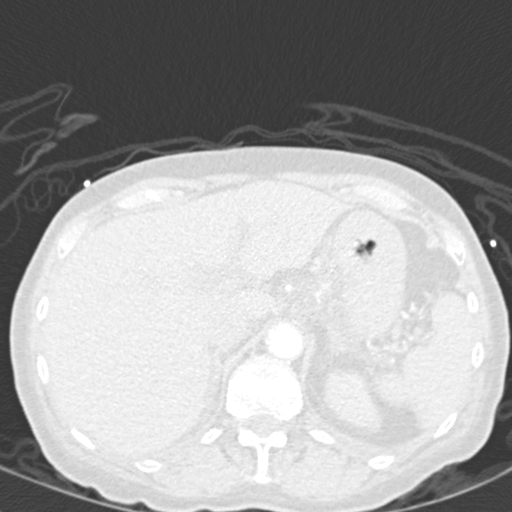
[im 38/288  soft-tissue]
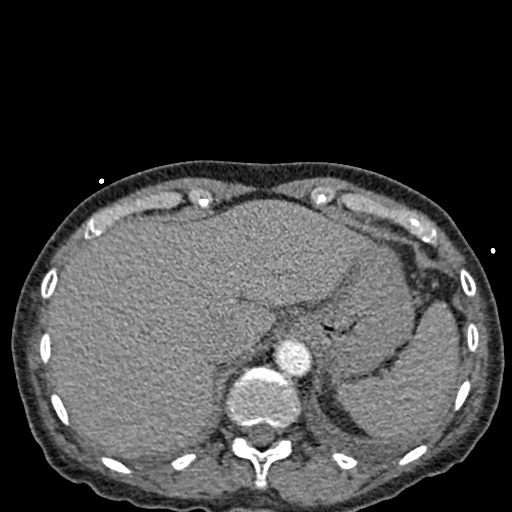
[im 50/288  lung]
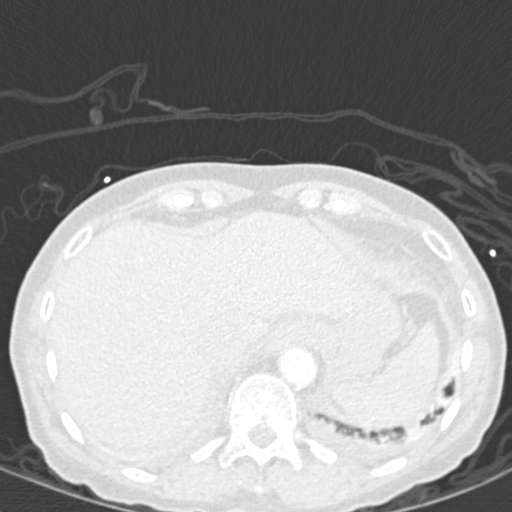
[im 63/288  soft-tissue]
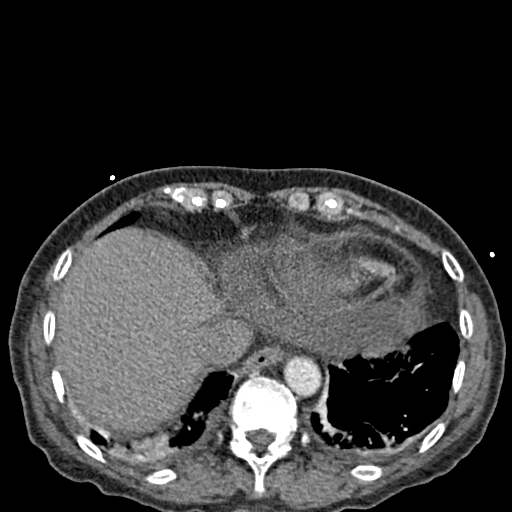
[im 88/288  lung]
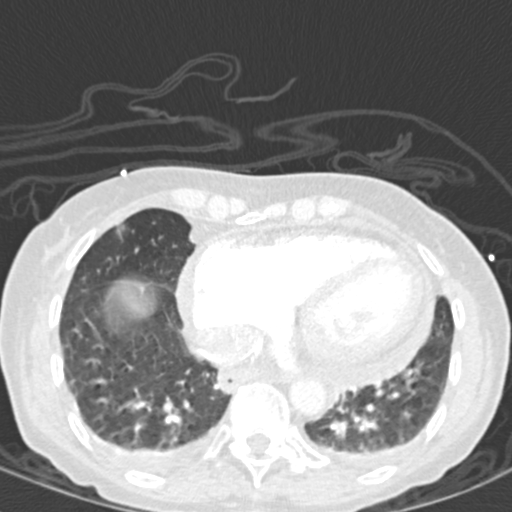
[im 100/288  soft-tissue]
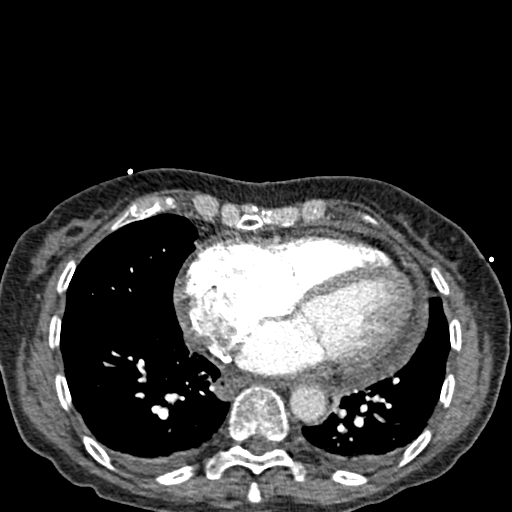
[im 113/288  lung]
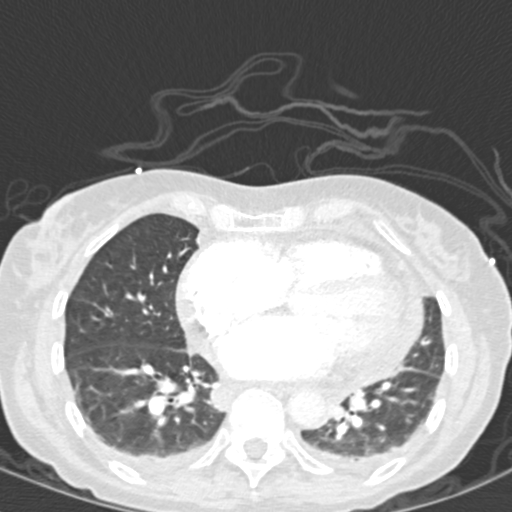
[im 138/288  soft-tissue]
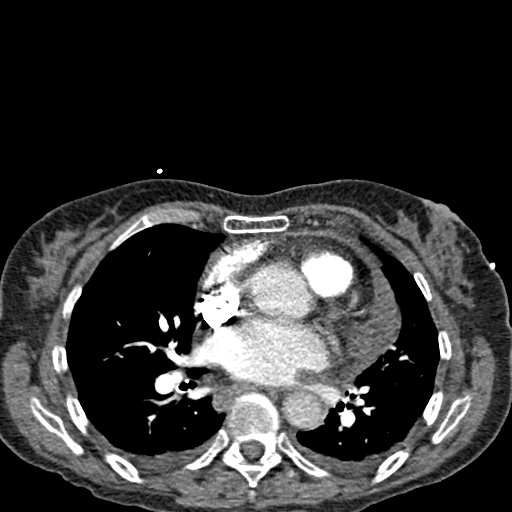
[im 150/288  lung]
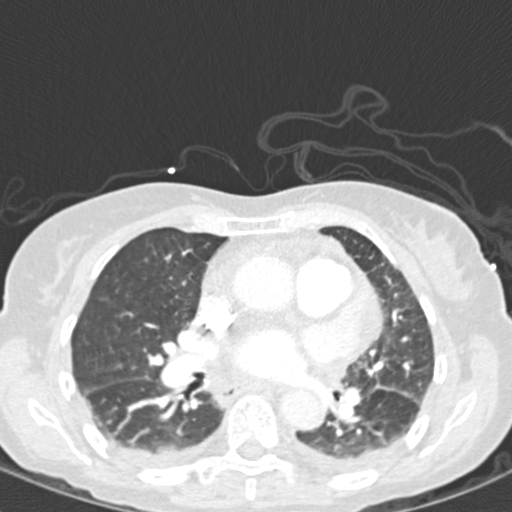
[im 175/288  soft-tissue]
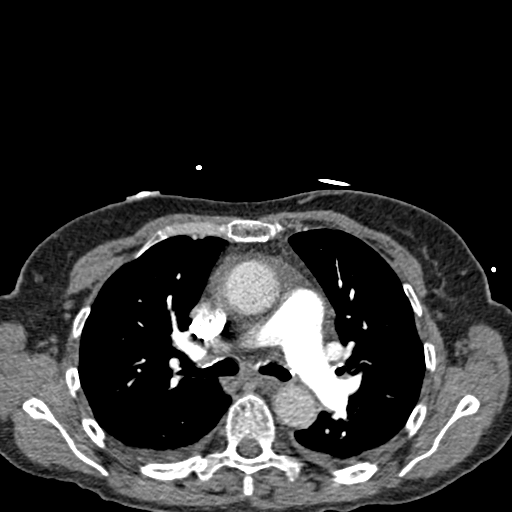
[im 188/288  lung]
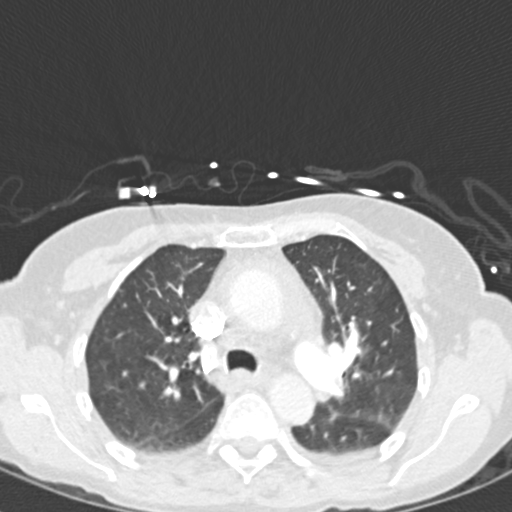
[im 200/288  soft-tissue]
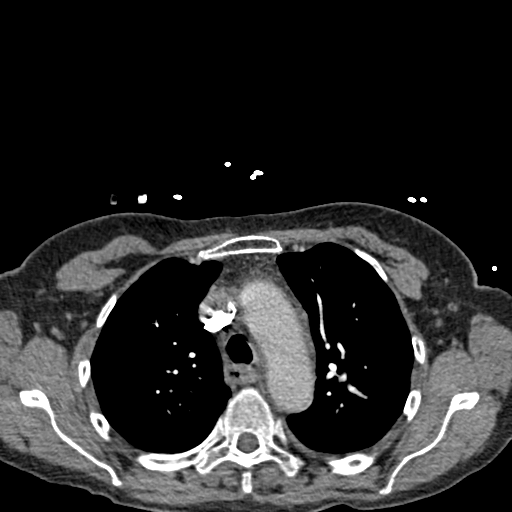
[im 225/288  lung]
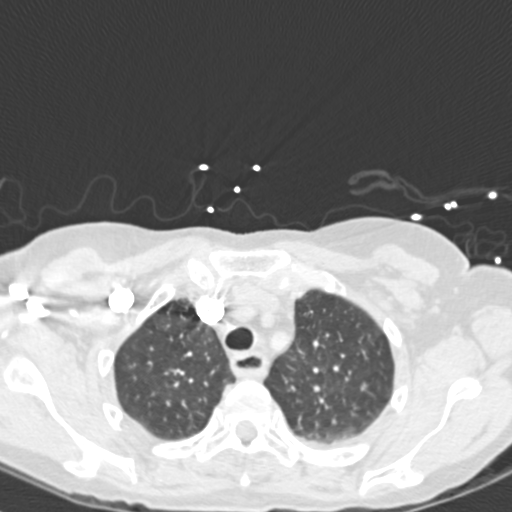
[im 238/288  soft-tissue]
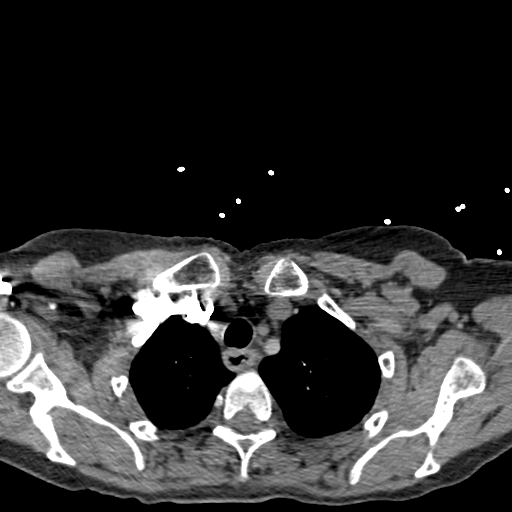
[im 250/288  lung]
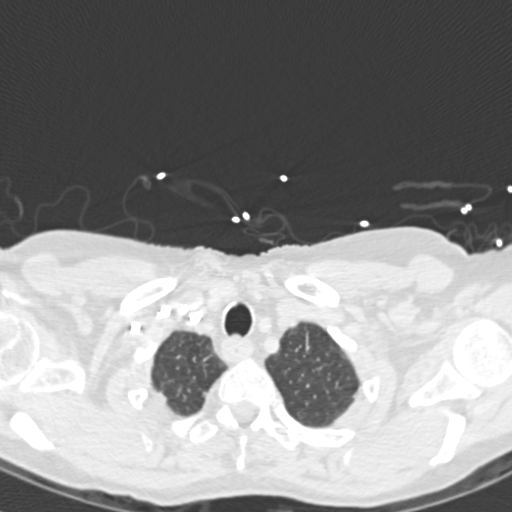
[im 275/288  soft-tissue]
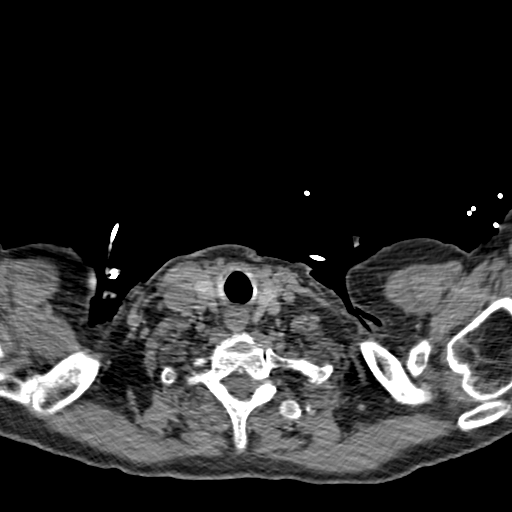

[Series 602: cor · coronal · 0.56mm/px · 3 of 88 slices shown]
[im 22/88  soft-tissue]
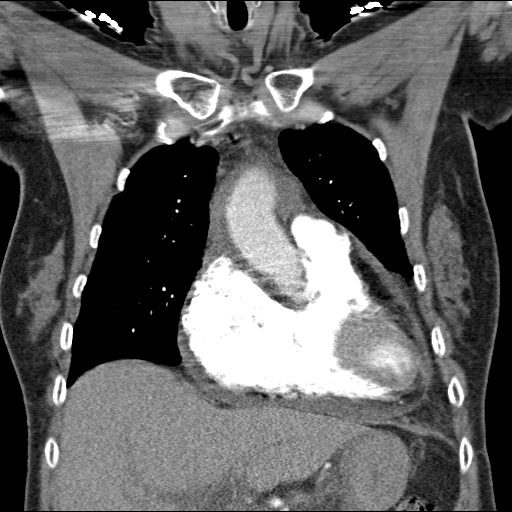
[im 44/88  soft-tissue]
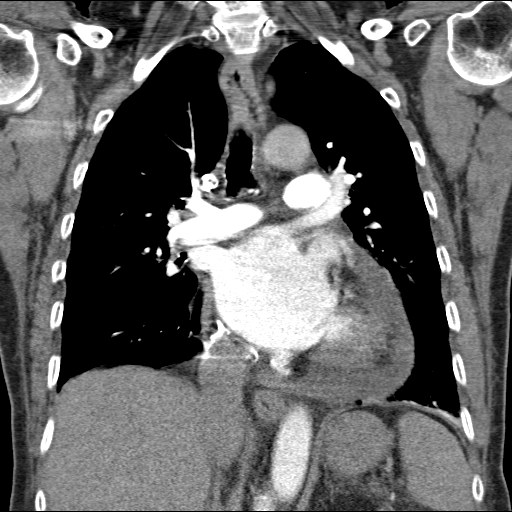
[im 66/88  soft-tissue]
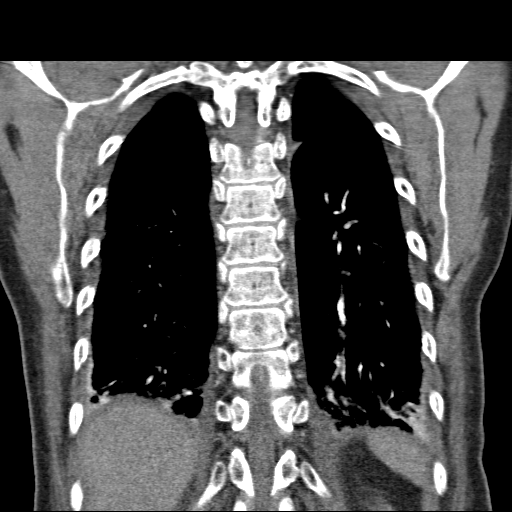

[19 of 46 positions shown; findings below may reference images not displayed]

FINDINGS: There are no filling defects within the pulmonary
arteries to suggest acute pulmonary embolism.  No acute findings of
the aorta great vessels.  There is a small pericardial effusion
measuring 10 mm in depth along the lateral wall left ventricle.
Heart otherwise appears normal.

Review of the lung parenchyma demonstrates bilateral small pleural
effusions.  There is interlobular septal thickening suggesting
interstitial edema.  No focal infiltrate.

Limited view of the upper abdomen demonstrates mild fat stranding
within the gastrohepatic ligament adjacent to the celiac trunk.
Limited view the skeleton demonstrates degenerative change.
IMPRESSION: 1..  No evidence acute pulmonary embolism.
2.  Small pericardial effusion.  Cannot exclude pericarditis.
3.  Small bilateral pleural effusions and interstitial edema.
4.  Mild stranding in the gastrohepatic ligament.  This is a
nonspecific finding.  Recommend biochemical correlation with
pancreatitis.

## 2012-02-05 MED ORDER — IOHEXOL 350 MG/ML SOLN
100.0000 mL | Freq: Once | INTRAVENOUS | Status: AC | PRN
  Administered 2012-02-05: 100 mL via INTRAVENOUS

## 2012-02-05 MED ORDER — ZOLPIDEM TARTRATE 5 MG PO TABS: 5.0000 mg | ORAL_TABLET | Freq: Every day | ORAL | Status: DC

## 2012-02-05 MED ORDER — WARFARIN - PHYSICIAN DOSING INPATIENT: Freq: Every day | Status: DC

## 2012-02-05 MED ORDER — RAMIPRIL 10 MG PO CAPS
10.0000 mg | ORAL_CAPSULE | Freq: Every day | ORAL | Status: DC
  Administered 2012-02-05 – 2012-02-06 (×2): 10 mg via ORAL
  Filled 2012-02-05 (×2): qty 1

## 2012-02-05 MED ORDER — DILTIAZEM HCL 50 MG/10ML IV SOLN: 10.0000 mg | Freq: Once | INTRAVENOUS | Status: DC

## 2012-02-05 MED ORDER — DEXTROSE 5 % IV SOLN
5.0000 mg/h | INTRAVENOUS | Status: DC
  Administered 2012-02-05: 15 mg/h via INTRAVENOUS
  Administered 2012-02-05: 5 mg/h via INTRAVENOUS
  Administered 2012-02-05: 15 mg/h via INTRAVENOUS
  Filled 2012-02-05 (×3): qty 100

## 2012-02-05 MED ORDER — INDOMETHACIN 50 MG PO CAPS
50.0000 mg | ORAL_CAPSULE | Freq: Three times a day (TID) | ORAL | Status: DC
  Administered 2012-02-05: 50 mg via ORAL
  Filled 2012-02-05 (×5): qty 1

## 2012-02-05 MED ORDER — SODIUM CHLORIDE 0.9 % IV BOLUS (SEPSIS)
500.0000 mL | Freq: Once | INTRAVENOUS | Status: AC
  Administered 2012-02-05: 500 mL via INTRAVENOUS

## 2012-02-05 MED ORDER — ZOLPIDEM TARTRATE 5 MG PO TABS
10.0000 mg | ORAL_TABLET | Freq: Every day | ORAL | Status: DC
  Administered 2012-02-05: 10 mg via ORAL
  Filled 2012-02-05: qty 2

## 2012-02-05 MED ORDER — NITROGLYCERIN 0.4 MG SL SUBL: 0.4000 mg | SUBLINGUAL_TABLET | SUBLINGUAL | Status: DC | PRN

## 2012-02-05 MED ORDER — COLCHICINE 0.6 MG PO TABS
0.6000 mg | ORAL_TABLET | Freq: Every day | ORAL | Status: DC
  Administered 2012-02-06: 0.6 mg via ORAL
  Filled 2012-02-05: qty 1

## 2012-02-05 MED ORDER — COLCHICINE 0.6 MG PO TABS
0.6000 mg | ORAL_TABLET | Freq: Two times a day (BID) | ORAL | Status: AC
  Administered 2012-02-05: 0.6 mg via ORAL
  Filled 2012-02-05 (×2): qty 1

## 2012-02-05 MED ORDER — ONDANSETRON HCL 4 MG/2ML IJ SOLN: 4.0000 mg | Freq: Four times a day (QID) | INTRAMUSCULAR | Status: DC | PRN

## 2012-02-05 MED ORDER — IBUPROFEN 600 MG PO TABS
600.0000 mg | ORAL_TABLET | Freq: Four times a day (QID) | ORAL | Status: DC | PRN
  Filled 2012-02-05: qty 1
  Filled 2012-02-05: qty 3

## 2012-02-05 MED ORDER — ACETAMINOPHEN 325 MG PO TABS: 650.0000 mg | ORAL_TABLET | ORAL | Status: DC | PRN

## 2012-02-05 MED ORDER — ACETAMINOPHEN 325 MG PO TABS
650.0000 mg | ORAL_TABLET | ORAL | Status: DC | PRN
  Administered 2012-02-05 (×2): 650 mg via ORAL
  Filled 2012-02-05 (×2): qty 2

## 2012-02-05 MED ORDER — ASPIRIN EC 81 MG PO TBEC
81.0000 mg | DELAYED_RELEASE_TABLET | Freq: Every day | ORAL | Status: DC
  Filled 2012-02-05: qty 1

## 2012-02-05 MED ORDER — WARFARIN SODIUM 2.5 MG PO TABS
2.5000 mg | ORAL_TABLET | Freq: Every day | ORAL | Status: DC
  Administered 2012-02-05 – 2012-02-06 (×2): 2.5 mg via ORAL
  Filled 2012-02-05 (×2): qty 1

## 2012-02-05 MED ORDER — DILTIAZEM LOAD VIA INFUSION
10.0000 mg | Freq: Once | INTRAVENOUS | Status: AC
  Administered 2012-02-05: 10 mg via INTRAVENOUS

## 2012-02-05 NOTE — ED Provider Notes (Signed)
History     CSN: [id number]  Arrival date & time 02/05/12  9147   First MD Initiated Contact with Patient 02/05/12 0345      Chief Complaint  Patient presents with  . Chest Pain  . Shortness of Breath    (Consider location/radiation/quality/duration/timing/severity/associated sxs/prior treatment) Patient is a 77 y.o. female presenting with chest pain and shortness of breath. The history is provided by the patient. No language interpreter was used.  Chest Pain The chest pain began 3 - 5 hours ago. Chest pain occurs constantly. The chest pain is unchanged. Associated with: nothing patient was at rest. The severity of the pain is moderate. The quality of the pain is described as dull. The pain radiates to the upper back. Primary symptoms include shortness of breath. Pertinent negatives for primary symptoms include no fever and no palpitations.  Pertinent negatives for associated symptoms include no claudication. She tried aspirin for the symptoms. Risk factors include being elderly.  Pertinent negatives for past medical history include no MI.    Shortness of Breath  Associated symptoms include chest pain and shortness of breath. Pertinent negatives include no fever.    Past Medical History  Diagnosis Date  . TIA (transient ischemic attack)   . PAF (paroxysmal atrial fibrillation)     has been on Flecainide in the past. Stopped 02/10/11; Remains on coumadin anticoagulation; intol to Rythmol and failed DCCV; noted to be in AFlutter 5/13;  Echo 01/2011:  EF 55-60%, mild MR, mild LAE.  Myoview 6/12:  No ischemia, EF 74%  . HTN (hypertension)   . Gross hematuria   . Dysphagia   . Endometriosis   . Osteopenia   . Insomnia   . Chronic anticoagulation     on coumadin  . Dizziness     improved with Meclizine    Past Surgical History  Procedure Date  . Polypectomy   . Total hysterectomy and bilateral salpingoopherectomy   . Appendectomy   . Skin cancer removal   . Cardioversion  01/14/2011    Procedure: CARDIOVERSION;  Surgeon: Duke Salvia, MD;  Location: [facility] OR;  Service: Cardiovascular;  Laterality: N/A;  To be completed in Neuro OR 33 time slot 0830 12/20  . Cardioversion 05/27/2011    Procedure: CARDIOVERSION;  Surgeon: Duke Salvia, MD;  Location: [facility] OR;  Service: Cardiovascular;  Laterality: N/A;    History reviewed. No pertinent family history.  History  Substance Use Topics  . Smoking status: Never Smoker   . Smokeless tobacco: Not on file  . Alcohol Use: 8.4 oz/week    14 Glasses of wine per week     Comment: 1-2 glasses of wine nightly    OB History    Grav Para Term Preterm Abortions TAB SAB Ect Mult Living                  Review of Systems  Constitutional: Negative for fever.  Respiratory: Positive for shortness of breath.   Cardiovascular: Positive for chest pain. Negative for palpitations, claudication and leg swelling.  All other systems reviewed and are negative.    Allergies  Procaine hcl  Home Medications   Current Outpatient Rx  Name  Route  Sig  Dispense  Refill  . ACETAMINOPHEN 325 MG PO TABS   Oral   Take 650 mg by mouth every 4 (four) hours as needed. For pain and inflammation         . B COMPLEX-C PO TABS   Oral  Take 1 tablet by mouth daily.           Marland Kitchen VITAMIN D 1000 UNITS PO CAPS   Oral   Take 1,000 Units by mouth daily.          Marland Kitchen DILTIAZEM HCL ER COATED BEADS 240 MG PO CP24      Take one capsule by mouth twice daily.   60 capsule   6   . GRAPE SEED 100 MG PO CAPS   Oral   Take 1 capsule by mouth daily.          Marland Kitchen FISH OIL 1000 MG PO CAPS   Oral   Take 1,000 mg by mouth daily.          Marland Kitchen RAMIPRIL 10 MG PO CAPS   Oral   Take 1 tablet by mouth daily.         . WARFARIN SODIUM 5 MG PO TABS   Oral   Take 2.5 mg by mouth daily.         Marland Kitchen ZOLPIDEM TARTRATE 10 MG PO TABS   Oral   Take 1 tablet (10 mg total) by mouth at bedtime. For sleep   30 tablet   5   . PREDNISONE 10 MG  PO TABS   Oral   Take 1 tablet (10 mg total) by mouth daily. 3 tabs daily for 3 days; 2 tabs daily for 3 days; 1 tab daily for 6 days   21 tablet   0     BP 117/69  Temp 99.8 F (37.7 C) (Oral)  Resp 27  SpO2 99%  Physical Exam  Constitutional: She is oriented to person, place, and time. She appears well-developed and well-nourished.  HENT:  Head: Normocephalic and atraumatic.  Mouth/Throat: Oropharynx is clear and moist.  Eyes: Conjunctivae normal are normal. Pupils are equal, round, and reactive to light.  Neck: Normal range of motion. Neck supple.  Cardiovascular: Intact distal pulses.  An irregularly irregular rhythm present. Tachycardia present.   Pulmonary/Chest: Effort normal and breath sounds normal. She has no wheezes. She has no rales.  Abdominal: Soft. Bowel sounds are normal. There is no tenderness. There is no rebound and no guarding.  Musculoskeletal: Normal range of motion. She exhibits no edema.  Neurological: She is alert and oriented to person, place, and time.  Skin: Skin is warm and dry. She is not diaphoretic.  Psychiatric: She has a normal mood and affect.    ED Course  Procedures (including critical care time)  Labs Reviewed  CBC WITH DIFFERENTIAL - Abnormal; Notable for the following:    RBC 3.77 (*)     Hemoglobin 11.8 (*)     HCT 35.7 (*)     Neutrophils Relative 80 (*)     Lymphocytes Relative 11 (*)     All other components within normal limits  POCT I-STAT, CHEM 8 - Abnormal; Notable for the following:    Glucose, Bld 136 (*)     Hemoglobin 11.6 (*)     HCT 34.0 (*)     All other components within normal limits  POCT I-STAT TROPONIN I  PROTIME-INR   Dg Chest Port 1 View  02/05/2012  *RADIOLOGY REPORT*  Clinical Data: Shortness of breath, chest pain.  PORTABLE CHEST - 1 VIEW  Comparison: 05/03/2011  Findings: Mild cardiomegaly and hyperinflation.  No confluent airspace opacities or effusions.  No acute bony abnormality.  IMPRESSION:  Cardiomegaly, hyperinflation.   Original Report Authenticated By: Charlett Nose,  M.D.      1. Atrial fibrillation with rapid ventricular response   2. Chest pain       MDM   Date: 02/05/2012  Rate: 134  Rhythm: atrial fibrillation  QRS Axis: normal  Intervals: normal  ST/T Wave abnormalities: nonspecific ST changes  Conduction Disutrbances:none  Narrative Interpretation:   Old EKG Reviewed: none available      CRITICAL CARE Performed by: Jasmine Awe   Total critical care time: 30 minutes Critical care time was exclusive of separately billable procedures and treating other patients.  Critical care was necessary to treat or prevent imminent or life-threatening deterioration.  Critical care was time spent personally by me on the following activities: development of treatment plan with patient and/or surrogate as well as nursing, discussions with consultants, evaluation of patient's response to treatment, examination of patient, obtaining history from patient or surrogate, ordering and performing treatments and interventions, ordering and review of laboratory studies, ordering and review of radiographic studies, pulse oximetry and re-evaluation of patient's condition.     Jasmine Awe, MD 02/05/12 [phone]

## 2012-02-05 NOTE — Progress Notes (Signed)
Pt needs new IV restarted to complete CTA (to r/o PE).  IV team called to restart IV, but pt refuses to have new IV started.  Pt currently on Card gtt.  [facility], [place] notified and says it is Okay for RN to pause Card gtt while pt is getting CTA done.  Will travel on tele to [place].

## 2012-02-05 NOTE — ED Notes (Signed)
EMS called to house tonight for Shortness of breath. Pt. Also c/o chest pain substernal and radiates to her back. Had recent surgery to left side of neck 2 days ago for melanoma. ! Week ago for other side. Given nitro X1 with no relief and pressure dropped to SBP 98. Given 324mg  of aspirin.

## 2012-02-05 NOTE — Progress Notes (Signed)
  Echocardiogram 2D Echocardiogram has been performed.  Selena Taylor 02/05/2012, 5:04 PM

## 2012-02-05 NOTE — H&P (Signed)
Physician History and Physical    MARVELENE STONEBERG MRN: [id number] DOB/AGE: 09/04/1935 77 y.o. Admit date: 02/05/2012   Primary Cardiologist:  Dr. Graciela Husbands  CC:  Chest pain, back pain, AF  HPI:  Pt is a 77 yo woman with pAF on coumadin, HTN who presents with acute onset of chest and back pain.  The pain started tonight while she was laying in bed.  She ate a normal dinner, it was not later than usual.  She did not eat anything unusual.  The pain is stabbing in quality and is worse with inspiration.  She did not notice that she was in AF until she was told that she was out of rhythm in the ED.  She does not normally have sx like this with her AF.  During the episode, she has also noticed that it is hard for her to swallow.  The sx have persisted.  She does not feel SOB.  She has not had any recent fevers.  She does have some ill contacts - she visits her husband in a nursing facility.  She has not traveled recently.  Of note, she was recently treated for joint inflammation with steroids - this was about a month ago.  She has no other acute complaints.    Review of systems: A review of 10 organ systems was done and is negative except as stated above in HPI  Past Medical History  Diagnosis Date  . TIA (transient ischemic attack)   . PAF (paroxysmal atrial fibrillation)     has been on Flecainide in the past. Stopped 02/10/11; Remains on coumadin anticoagulation; intol to Rythmol and failed DCCV; noted to be in AFlutter 5/13;  Echo 01/2011:  EF 55-60%, mild MR, mild LAE.  Myoview 6/12:  No ischemia, EF 74%  . HTN (hypertension)   . Gross hematuria   . Dysphagia   . Endometriosis   . Osteopenia   . Insomnia   . Chronic anticoagulation     on coumadin  . Dizziness     improved with Meclizine   Past Surgical History  Procedure Date  . Polypectomy   . Total hysterectomy and bilateral salpingoopherectomy   . Appendectomy   . Skin cancer removal   . Cardioversion 01/14/2011    Procedure:  CARDIOVERSION;  Surgeon: Duke Salvia, MD;  Location: [facility] OR;  Service: Cardiovascular;  Laterality: N/A;  To be completed in Neuro OR 33 time slot 0830 12/20  . Cardioversion 05/27/2011    Procedure: CARDIOVERSION;  Surgeon: Duke Salvia, MD;  Location: [facility] OR;  Service: Cardiovascular;  Laterality: N/A;   History   Social History  . Marital Status: Married    Spouse Name: N/A    Number of Children: N/A  . Years of Education: N/A   Occupational History  . Not on file.   Social History Main Topics  . Smoking status: Never Smoker   . Smokeless tobacco: Not on file  . Alcohol Use: 8.4 oz/week    14 Glasses of wine per week     Comment: 1-2 glasses of wine nightly  . Drug Use: No  . Sexually Active: Not Currently   Other Topics Concern  . 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  History reviewed. No pertinent family history.   Allergies  Allergen Reactions  . Procaine Hcl     Rapid heart rate     (Not in a hospital admission)  Current facility-administered medications:0.9 %  sodium chloride infusion, 250 mL, Intravenous, Continuous, [name], PA;  diltiazem (CARDIZEM) 100 mg in dextrose 5 % 100 mL infusion, 5-15 mg/hr, Intravenous, Titrated, April K Palumbo-Rasch, MD, Last Rate: 5 mL/hr at 02/05/12 0432, 5 mg/hr at 02/05/12 0432 Current outpatient prescriptions:acetaminophen (TYLENOL) 325 MG tablet, Take 650 mg by mouth every 4 (four) hours as needed. For pain and inflammation, Disp: , Rfl: ;  B Complex-C (B-COMPLEX WITH VITAMIN C) tablet, Take 1 tablet by mouth daily.  , Disp: , Rfl: ;  Cholecalciferol (VITAMIN D) 1000 UNITS capsule, Take 1,000 Units by mouth daily. , Disp: , Rfl:  diltiazem (CARDIZEM CD) 240 MG 24 hr capsule, Take one capsule by mouth twice daily.,  Disp: 60 capsule, Rfl: 6;  Grape Seed 100 MG CAPS, Take 1 capsule by mouth daily. , Disp: , Rfl: ;  Omega-3 Fatty Acids (FISH OIL) 1000 MG CAPS, Take 1,000 mg by mouth daily. , Disp: , Rfl: ;  ramipril (ALTACE) 10 MG capsule, Take 1 tablet by mouth daily., Disp: , Rfl: ;  warfarin (COUMADIN) 5 MG tablet, Take 2.5 mg by mouth daily., Disp: , Rfl:  zolpidem (AMBIEN) 10 MG tablet, Take 1 tablet (10 mg total) by mouth at bedtime. For sleep, Disp: 30 tablet, Rfl: 5;  predniSONE (DELTASONE) 10 MG tablet, Take 1 tablet (10 mg total) by mouth daily. 3 tabs daily for 3 days; 2 tabs daily for 3 days; 1 tab daily for 6 days, Disp: 21 tablet, Rfl: 0  Physical Exam: Blood pressure 118/88, pulse 65, temperature 99.8 F (37.7 C), temperature source Oral, resp. rate 25, SpO2 98.00%.; There is no height or weight on file to calculate BMI. Temp:  [99.8 F (37.7 C)] 99.8 F (37.7 C) (01/11 0338) Pulse Rate:  [65-110] 65  (01/11 0445) Resp:  [22-28] 25  (01/11 0445) BP: (114-121)/(62-88) 118/88 mmHg (01/11 0445) SpO2:  [98 %-99 %] 98 % (01/11 0445)  No intake or output data in the 24 hours ending 02/05/12 0453 General: NAD Heent: MMM Neck: No JVD  CV: irreg irreg, no m, no pericardial friction rub Lungs: Clear to auscultation bilaterally with normal respiratory effort - deep breathing produces pain of the chief complaint Abdomen: Soft, nontender, nondistended Extremities: No edema Skin: no rash Neurologic: Alert and oriented x 3, grossly nonfocal  Psych: Normal mood and affect    Labs: No results found for this basename: CKTOTAL:4,CKMB:4,TROPONINI:4 in the last 72 hours Lab Results  Component Value Date   WBC 8.3 02/05/2012   HGB 11.6* 02/05/2012   HCT 34.0* 02/05/2012   MCV 94.7 02/05/2012   PLT 245 02/05/2012    Lab 02/05/12 0410  NA 137  K 4.0  CL 107  CO2 --  BUN 14  CREATININE 0.80  CALCIUM --  PROT --  BILITOT --  ALKPHOS --  ALT --  AST --  GLUCOSE 136*   No results found for this  basename: CHOL, HDL, LDLCALC, TRIG    INR 9.14   EKG:  AF with RVR, PVC, no acute ischemic changes  Echo 1/13 - Left ventricle: The cavity size was normal. Wall thickness was normal. Systolic function was normal. The estimated ejection fraction was in the range of 55% to 60%. - Mitral valve: Mild regurgitation. -  Left atrium: The atrium was mildly dilated. - Atrial septum: No defect or patent foramen ovale was identified.    Radiology:  Dg Chest Port 1 View  02/05/2012  *RADIOLOGY REPORT*  Clinical Data: Shortness of breath, chest pain.  PORTABLE CHEST - 1 VIEW  Comparison: 05/03/2011  Findings: Mild cardiomegaly and hyperinflation.  No confluent airspace opacities or effusions.  No acute bony abnormality.  IMPRESSION: Cardiomegaly, hyperinflation.   Original Report Authenticated By: Charlett Nose, M.D.     ASSESSMENT: Pt is a 77 yo woman with pAF on coumadin, HTN who presents with acute onset of chest and back pain.  PLAN:  Chest pain/back pain - given recent inflammatory joint pains requiring steroids, wonder if this could be pericarditis?  No EKG findings, but pt with pleuritic chest pain/back pain.  Pt is normotensive, but dissection is on the differential.  PE could be considered, but pt is on therapeutic anticoagulation.  ACS is also a consideration.     - check echo to r/o pericardial effusion - sed rate, CRP - NSAIDs for pain - serial troponins and EKGs - monitor on tele - aspirin 81 mg daily - check lipids - consider stress testing  pAF - pt currently in AF with RVR - this is likely secondary to pain - dilt gtt - hold home PO dilt - INR therapeutic, continue coumadin  HTN - controlled.  Continue home meds  Ppx - therapeutic INR  Dispo - admit to [facility]   Signed: Hilary Hertz, MD Cardiology Fellow 02/05/2012, 4:53 AM

## 2012-02-05 NOTE — Progress Notes (Signed)
Patient Name: Selena Taylor      SUBJECTIVE: The admitted last night with chest pain and found to be atrial fibrillation with a rapid rate. She has been intolerant of multiple antiarrhythmic medications in the past. Cardiac enzymes are negative HR much better on IV dilt changed from PO  She began with pleuritic chest pain over the last 12 hours. It is worse when she lays flat and better when she sits up. There has been some accompany shortness of breath. She is on chronic Coumadin with a therapeutic INR.  Over the last couple of months also she has had problems with aching in her thighs and shoulders. Her sedimentation rate has been moderately elevated at 57. She was recently treated with a burst of steroids, NSAIDs having been deferred because of concomitant Coumadin  His recent resection of a melanoma. He did not stop her Coumadin for this. Past Medical History  Diagnosis Date  . TIA (transient ischemic attack)   . PAF (paroxysmal atrial fibrillation)     has been on Flecainide in the past. Stopped 02/10/11; Remains on coumadin anticoagulation; intol to Rythmol and failed DCCV; noted to be in AFlutter 5/13;  Echo 01/2011:  EF 55-60%, mild MR, mild LAE.  Myoview 6/12:  No ischemia, EF 74%  . HTN (hypertension)   . Gross hematuria   . Dysphagia   . Endometriosis   . Osteopenia   . Insomnia   . Chronic anticoagulation     on coumadin  . Dizziness     improved with Meclizine    PHYSICAL EXAM Filed Vitals:   02/05/12 0700 02/05/12 0715 02/05/12 0810 02/05/12 1157  BP: 121/90 115/76 126/89 122/80  Pulse: 81 83 87 75  Temp:   98.5 F (36.9 C)   TempSrc:   Oral   Resp: 24 23 20    Height:   5' 2.5" (1.588 m)   Weight:   129 lb 6.6 oz (58.7 kg)   SpO2: 97% 96% 95%     General appearance: alert and cooperative Lungs: clear to auscultation bilaterally Heart: irregularly irregular rhythm with scrathcy murmur LLSB Abdomen: soft, non-tender; bowel sounds normal; no masses,  no  organomegaly Skin: Skin color, texture, turgor normal. No rashes or lesions Neurologic: Alert and oriented X 3, normal strength and tone. Normal symmetric reflexes. Normal coordination and gait    TELEMETRY: Reviewed telemetry pt in AF rVR   No intake or output data in the 24 hours ending 02/05/12 1301  LABS: Basic Metabolic Panel:  Lab 02/05/12 2956  NA 137  K 4.0  CL 107  CO2 --  GLUCOSE 136*  BUN 14  CREATININE 0.80  CALCIUM --  MG --  PHOS --   Cardiac Enzymes:  Basename 02/05/12 1008  CKTOTAL --  CKMB --  CKMBINDEX --  TROPONINI <0.30   CBC:  Lab 02/05/12 0410 02/05/12 0402  WBC -- 8.3  NEUTROABS -- 6.7  HGB 11.6* 11.8*  HCT 34.0* 35.7*  MCV -- 94.7  PLT -- 245   PROTIME:  Basename 02/05/12 0402  LABPROT 26.7*  INR 2.62*   Liver Function Tests: No results found for this basename: AST:2,ALT:2,ALKPHOS:2,BILITOT:2,PROT:2,ALBUMIN:2 in the last 72 hours No results found for this basename: LIPASE:2,AMYLASE:2 in the last 72 hours BNP: BNP (last 3 results)  Basename 02/05/12 1008  PROBNP 603.1*   D   ASSESSMENT AND PLAN:  Patient Active Hospital Problem List: Atrial fibrillation/flutter (11/03/2006)   Pleuritic chest pain (02/05/2012)  the patient presents  with pleuritic chest pain which clinically sounds like pericarditis for which there is no electrocardiographic evidence. This occurs in the context of systemic inflammation and elevated sedimentation rate. This prompts the need to look for unifying diagnosis. The pleuritic pain unlikely represents pulmonary embolism in the setting of therapeutic anticoagulation, but as it would require a change in therapy, will undertake a CAT scan to exclude.  In the event patient had pericarditis with this illness, clinically not evident at once adequate exposure was made to prednisone, rebound pericarditis is a possible explanation here also. For that reason we will try to not use prednisone right now although in  the event that she has polymyalgia as the explanation for her systemic symptoms prednisone is clearly indicated.  Today we'll begin colchicine and NSAIDs.  I don't think this represents a myocardial process prompting the need for stress testing.    Signed, Sherryl Manges MD  02/05/2012

## 2012-02-06 ENCOUNTER — Other Ambulatory Visit: Payer: Self-pay | Admitting: Internal Medicine

## 2012-02-06 DIAGNOSIS — I4891 Unspecified atrial fibrillation: Secondary | ICD-10-CM

## 2012-02-06 LAB — PROTIME-INR
INR: 2.56 — ABNORMAL HIGH (ref 0.00–1.49)
Prothrombin Time: 26.3 s — ABNORMAL HIGH (ref 11.6–15.2)

## 2012-02-06 LAB — LIPID PANEL
Cholesterol: 125 mg/dL (ref 0–200)
HDL: 91 mg/dL
LDL Cholesterol: 26 mg/dL (ref 0–99)
Total CHOL/HDL Ratio: 1.4 ratio
Triglycerides: 40 mg/dL
VLDL: 8 mg/dL (ref 0–40)

## 2012-02-06 LAB — BASIC METABOLIC PANEL WITH GFR
BUN: 21 mg/dL (ref 6–23)
CO2: 22 meq/L (ref 19–32)
Calcium: 8.5 mg/dL (ref 8.4–10.5)
Chloride: 103 meq/L (ref 96–112)
Creatinine, Ser: 0.66 mg/dL (ref 0.50–1.10)
GFR calc Af Amer: >90 mL/min
GFR calc non Af Amer: 85 mL/min — ABNORMAL LOW
Glucose, Bld: 110 mg/dL — ABNORMAL HIGH (ref 70–99)
Potassium: 3.5 meq/L (ref 3.5–5.1)
Sodium: 134 meq/L — ABNORMAL LOW (ref 135–145)

## 2012-02-06 LAB — CBC
Hemoglobin: 11 g/dL — ABNORMAL LOW (ref 12.0–15.0)
MCH: 31.7 pg (ref 26.0–34.0)
RBC: 3.47 MIL/uL — ABNORMAL LOW (ref 3.87–5.11)
WBC: 7.6 10*3/uL (ref 4.0–10.5)

## 2012-02-06 MED ORDER — ESOMEPRAZOLE MAGNESIUM 40 MG PO CPDR: 40.0000 mg | DELAYED_RELEASE_CAPSULE | Freq: Every day | ORAL | Status: DC

## 2012-02-06 MED ORDER — INDOMETHACIN 50 MG PO CAPS
50.0000 mg | ORAL_CAPSULE | Freq: Two times a day (BID) | ORAL | Status: DC
  Filled 2012-02-06 (×2): qty 1

## 2012-02-06 MED ORDER — COLCHICINE 0.6 MG PO TABS: 0.6000 mg | ORAL_TABLET | Freq: Every day | ORAL | Status: DC

## 2012-02-06 MED ORDER — ESOMEPRAZOLE MAGNESIUM 20 MG PO CPDR: 20.0000 mg | DELAYED_RELEASE_CAPSULE | Freq: Every day | ORAL | Status: DC

## 2012-02-06 MED ORDER — INDOMETHACIN 50 MG PO CAPS: 50.0000 mg | ORAL_CAPSULE | Freq: Two times a day (BID) | ORAL | Status: DC

## 2012-02-06 NOTE — Progress Notes (Signed)
Patient Name: Selena Taylor      SUBJECTIVE: much improved  Min pleuritic pain HR better  Past Medical History  Diagnosis Date  . TIA (transient ischemic attack)   . PAF (paroxysmal atrial fibrillation)     has been on Flecainide in the past. Stopped 02/10/11; Remains on coumadin anticoagulation; intol to Rythmol and failed DCCV; noted to be in AFlutter 5/13;  Echo 01/2011:  EF 55-60%, mild MR, mild LAE.  Myoview 6/12:  No ischemia, EF 74%  . HTN (hypertension)   . Gross hematuria   . Dysphagia   . Endometriosis   . Osteopenia   . Insomnia   . Chronic anticoagulation     on coumadin  . Dizziness     improved with Meclizine    PHYSICAL EXAM Filed Vitals:   02/05/12 1400 02/05/12 2033 02/06/12 0453 02/06/12 0630  BP: 127/83 107/70 102/66   Pulse: 99 82 79   Temp: 98.7 F (37.1 C) 98.5 F (36.9 C) 97.9 F (36.6 C)   TempSrc: Oral Oral Oral   Resp: 18 15 18    Height:      Weight:   129 lb 3 oz (58.6 kg)   SpO2: 96% 94% 90% 96%    Well developed and nourished in no acute distress HENT normal Neck supple with JVP-flat Carotids brisk and full without bruits Clear Irregularly irregular rate and rhythm with controlled ventricular response, 2/6 murmur not really a rub Abd-soft with active BS without hepatomegaly No Clubbing cyanosis edema Skin-warm and dry A & Oriented  Grossly normal sensory and motor function  TELEMETRY: Reviewed telemetry pt in AFib wit controlled ventricualr response:    Intake/Output Summary (Last 24 hours) at 02/06/12 0818 Last data filed at 02/06/12 0500  Gross per 24 hour  Intake 828.33 ml  Output      0 ml  Net 828.33 ml    LABS: Basic Metabolic Panel:  Lab 02/06/12 1610 02/05/12 0410  NA 134* 137  K 3.5 4.0  CL 103 107  CO2 22 --  GLUCOSE 110* 136*  BUN 21 14  CREATININE 0.66 0.80  CALCIUM 8.5 --  MG -- --  PHOS -- --   Cardiac Enzymes:  Basename 02/05/12 2138 02/05/12 1647 02/05/12 1008  CKTOTAL -- -- --  CKMB -- -- --    CKMBINDEX -- -- --  TROPONINI <0.30 <0.30 <0.30   CBC:  Lab 02/06/12 0500 02/05/12 0410 02/05/12 0402  WBC 7.6 -- 8.3  NEUTROABS -- -- 6.7  HGB 11.0* 11.6* 11.8*  HCT 32.7* 34.0* 35.7*  MCV 94.2 -- 94.7  PLT 213 -- 245   PROTIME:  Basename 02/06/12 0500 02/05/12 0402  LABPROT 26.3* 26.7*  INR 2.56* 2.62*   Liver Function Tests: No results found for this basename: AST:2,ALT:2,ALKPHOS:2,BILITOT:2,PROT:2,ALBUMIN:2 in the last 72 hours No results found for this basename: LIPASE:2,AMYLASE:2 in the last 72 hours BNP: BNP (last 3 results)  Basename 02/05/12 1008  PROBNP 603.1*   D-Dimer: No results found for this basename: DDIMER:2 in the last 72 hours Hemoglobin A1C: No results found for this basename: HGBA1C in the last 72 hours Fasting Lipid Panel:  Basename 02/06/12 0500  CHOL 125  HDL 91  LDLCALC 26  TRIG 40  CHOLHDL 1.4  LDLDIRECT --   CT- as expected for PE small peri- cardial effusion   ASSESSMENT AND PLAN:  Patient Active Hospital Problem List: Atrial fibrillation/flutter (11/03/2006)   Pleuritic chest pain (02/05/2012)   Myalgia  Prob pericarditis  Will treat with colchicine once a day indomethacin twice a day. The patient is alert risk of bleeding. I've also reviewed with the patient the concern that I have given her systemic inflammatory illness characterized by poor weakness and pain suggests myalgia at the appropriate therapy would be prednisone. Prednisone however is associated with the likelihood of relapsing pericarditis and hence I would like to. Will have her follow up with her PCP and perhaps consideration can be made as the temporal artery biopsy to definitively include or exclude the diagnosis  Change dilt to po as at home    Signed, Sherryl Manges MD  02/06/2012

## 2012-02-06 NOTE — Progress Notes (Signed)
Courtesy note: reviewed chart and spoke with patient Reviewed the findings with Mrs. Kunath. Discussed treatment and the importance of PPI therapy and vigilance re: sings of GI bleeding. Will see in the office next Thursday.

## 2012-02-06 NOTE — Discharge Summary (Signed)
Discharge Summary   Patient ID: Selena Taylor MRN: [id number], DOB/AGE: Nov 21, 1935 77 y.o.  Primary MD: Illene Regulus, MD Primary Cardiologist: Sherryl Manges MD Admit date: 02/05/2012 D/C date:     02/06/2012      Primary Discharge Diagnoses:  1. Pleuritic chest pain  - No objective evidence of acute cardiac ischemia  - CTA chest showed small pericardial effusion, no PE  2. Suspected Pericarditis  - DC on colchicine and Indomethacin  - Stopped ASA and placed on PPI   - Echo was completed but not read at time of discharge. Please follow up  3. Atrial Fibrillation w/ rvr  - On coumadin  Secondary Discharge Diagnoses:  . TIA (transient ischemic attack)   . PAF (paroxysmal atrial fibrillation)     has been on Flecainide in the past. Stopped 02/10/11; Remains on coumadin anticoagulation; intol to Rythmol and failed DCCV; noted to be in AFlutter 5/13;  Echo 01/2011:  EF 55-60%, mild MR, mild LAE.  Myoview 6/12:  No ischemia, EF 74%  . HTN (hypertension)   . Gross hematuria   . Dysphagia   . Endometriosis   . Osteopenia   . Insomnia   . Chronic anticoagulation     on coumadin  . Dizziness     improved with Meclizine    Allergies Allergies  Allergen Reactions  . Procaine Hcl     Rapid heart rate    Diagnostic Studies/Procedures:   02/05/2012 - CT ANGIOGRAPHY CHEST  Findings: There are no filling defects within the pulmonary arteries to suggest acute pulmonary embolism.  No acute findings of the aorta great vessels.  There is a small pericardial effusion measuring 10 mm in depth along the lateral wall left ventricle. Heart otherwise appears normal.  Review of the lung parenchyma demonstrates bilateral small pleural effusions.  There is interlobular septal thickening suggesting interstitial edema.  No focal infiltrate.  Limited view of the upper abdomen demonstrates mild fat stranding within the gastrohepatic ligament adjacent to the celiac trunk. Limited view the skeleton  demonstrates degenerative change.  IMPRESSION:  1.  No evidence acute pulmonary embolism. 2.  Small pericardial effusion.  Cannot exclude pericarditis. 3.  Small bilateral pleural effusions and interstitial edema. 4.  Mild stranding in the gastrohepatic ligament.  This is a nonspecific finding.  Recommend biochemical correlation with pancreatitis.       History of Present Illness: 77 y.o. female w/ the above medical problems who presented to [facility] on [facility]05[facility]12 with complaints of chest and back pain. The pain started while lying in bed and was pleuritic in nature. Note she was recently treated with steroids for inflammatory joint pain.  Hospital Course: In the ED, EKG revealed A.fib w/ rvr, no acute ischemic changes . CXR showed cardiomegaly and hyperinflation. Labs were significant for normal poc troponin, pBNP 603, ESR 42, CRP 11.2, INR 2.62, Hgb 11.6, otherwise unremarkable CBC/BMET. She was placed on a cardizem drip as well as ASA, Indomethacin and Colchicine and admitted for further evaluation and treatment. Her heart rate improved and cardizem was transitioned back to oral dosing. Cardiac enzymes were cycled and remained negative. Her EKG was not consistent with pericarditis, however, only 60-70% of pericarditis cases have positive EKGs. CTA chest was performed revealing small pericardial effusion, no PE, could not rule out pericarditis. Her chest/back pain improved. There was concern that she may have PMR and possibly temporal arteritis. She will be discharged on Indomethacin and Colchicine with close follow up with her  PCP with consideration for temporal artery biopsy. ASA was stopped and she was placed on a PPI for primary prevention. She was seen and evaluated by Dr. Graciela Husbands who felt she was stable for discharge home with plans for follow up as scheduled below. Echo was completed but not read at time of discharge and will need to be followed up.   Discharge Vitals: Blood pressure 102/66,  pulse 79, temperature 97.9 F (36.6 C), temperature source Oral, resp. rate 18, height 5' 2.5" (1.588 m), weight 129 lb 3 oz (58.6 kg), SpO2 96.00%.  Labs: Component Value Date   WBC 7.6 02/06/2012   HGB 11.0* 02/06/2012   HCT 32.7* 02/06/2012   MCV 94.2 02/06/2012   PLT 213 02/06/2012    Lab 02/06/12 0500  NA 134*  K 3.5  CL 103  CO2 22  BUN 21  CREATININE 0.66  CALCIUM 8.5  GLUCOSE 110*   Basename 02/05/12 2138 02/05/12 1647 02/05/12 1008  TROPONINI <0.30 <0.30 <0.30   Component Value Date   CHOL 125 02/06/2012   HDL 91 02/06/2012   LDLCALC 26 02/06/2012   TRIG 40 02/06/2012     02/05/2012 06:28  Sed Rate 42 (H)     02/05/2012 06:28  CRP 11.2 (H)     02/05/2012 10:08  Pro B Natriuretic peptide (BNP) 603.1 (H)    Discharge Medications     Medication List     As of 02/06/2012  9:29 AM    STOP taking these medications         predniSONE 10 MG tablet   Commonly known as: DELTASONE      TAKE these medications         acetaminophen 325 MG tablet   Commonly known as: TYLENOL   Take 650 mg by mouth every 4 (four) hours as needed. For pain and inflammation      B-complex with vitamin C tablet   Take 1 tablet by mouth daily.      colchicine 0.6 MG tablet   Take 1 tablet (0.6 mg total) by mouth daily.      diltiazem 240 MG 24 hr capsule   Commonly known as: CARDIZEM CD   Take one capsule by mouth twice daily.      esomeprazole 20 MG capsule   Commonly known as: NEXIUM   Take 1 capsule (20 mg total) by mouth daily before breakfast.      Fish Oil 1000 MG Caps   Take 1,000 mg by mouth daily.      Grape Seed 100 MG Caps   Take 1 capsule by mouth daily.      indomethacin 50 MG capsule   Commonly known as: INDOCIN   Take 1 capsule (50 mg total) by mouth 2 (two) times daily with a meal.      ramipril 10 MG capsule   Commonly known as: ALTACE   Take 1 tablet by mouth daily.      Vitamin D 1000 UNITS capsule   Take 1,000 Units by mouth daily.      warfarin 5  MG tablet   Commonly known as: COUMADIN   Take 2.5 mg by mouth daily.      zolpidem 10 MG tablet   Commonly known as: AMBIEN   Take 1 tablet (10 mg total) by mouth at bedtime. For sleep          Disposition   Discharge Orders    Future Appointments: Provider: Department: Dept Phone: Center:  02/07/2012 1:30 PM [facility] [phone] None     Future Orders Please Complete By Expires   Diet - low sodium heart healthy      Increase activity slowly      Discharge instructions      Comments:   * Please take all medications as prescribed and bring them with you to your office visit  * Please follow up with your primary care provider in the next 1-2 weeks  * Monitor for any signs of bleeding, such as bright red blood or dark colored stools.     Follow-up Information    Schedule an appointment as soon as possible for a visit with Illene Regulus, MD. (Please make an appointment to see Dr. Debby Bud in the next 1-2 weeks)    Contact information:   [address] [place] [address] [phone]       Follow up with Sherryl Manges, MD. (Our office will call you with your appointment time)    Contact information:   [facility] [address] [place] [address] [phone]           Outstanding Labs/Studies:  Echo was completed but not read at time of discharge  Duration of Discharge Encounter: Greater than 30 minutes including physician and PA time.  Signed, Kehinde Bowdish PA-C 02/06/2012, 9:29 AM

## 2012-02-07 ENCOUNTER — Telehealth: Payer: Self-pay | Admitting: Internal Medicine

## 2012-02-07 ENCOUNTER — Ambulatory Visit: Payer: Self-pay | Admitting: Cardiovascular Disease

## 2012-02-07 DIAGNOSIS — I4891 Unspecified atrial fibrillation: Secondary | ICD-10-CM

## 2012-02-07 DIAGNOSIS — G459 Transient cerebral ischemic attack, unspecified: Secondary | ICD-10-CM

## 2012-02-07 NOTE — Telephone Encounter (Signed)
Called pt, lvmom to schedule.  Will try back soon if call is not returned.

## 2012-02-07 NOTE — Telephone Encounter (Signed)
Message copied by Newell Coral on Mon Feb 07, 2012  2:27 PM ------      Message from: Jacques Navy      Created: Sun Feb 06, 2012 10:27 AM       Please schedule for next Thursday. Thanks

## 2012-02-08 NOTE — Telephone Encounter (Signed)
Pt called back, she is scheduled 02/10/12 @ 4:00

## 2012-02-10 ENCOUNTER — Encounter: Payer: Self-pay | Admitting: Internal Medicine

## 2012-02-10 ENCOUNTER — Ambulatory Visit (INDEPENDENT_AMBULATORY_CARE_PROVIDER_SITE_OTHER): Payer: Medicare Other | Admitting: Internal Medicine

## 2012-02-10 VITALS — BP 102/68 | HR 89 | Temp 98.4°F | Resp 12 | Wt 132.0 lb

## 2012-02-10 DIAGNOSIS — I319 Disease of pericardium, unspecified: Secondary | ICD-10-CM

## 2012-02-12 DIAGNOSIS — I319 Disease of pericardium, unspecified: Secondary | ICD-10-CM | POA: Insufficient documentation

## 2012-02-12 NOTE — Progress Notes (Signed)
  Subjective:    Patient ID: Selena Taylor, female    DOB: [birth year], 77 y.o.   MRN: [id number]  HPI Mrs. Nass was recently hospitalized with pleuritic chest pain and was diagnosed with pericarditis, sent home on colchicine and indomethacin. PPI therapy was started to prevent gastric irritation. She was asked to come in to check for any medication side effects. She reports she is doing fine. She has not been contacted about follow up with Dr. Graciela Husbands.  PMH, FamHx and SocHx reviewed for any changes and relevance. Current Outpatient Prescriptions on File Prior to Visit  Medication Sig Dispense Refill  . acetaminophen (TYLENOL) 325 MG tablet Take 650 mg by mouth every 4 (four) hours as needed. For pain and inflammation      . B Complex-C (B-COMPLEX WITH VITAMIN C) tablet Take 1 tablet by mouth daily.        . Cholecalciferol (VITAMIN D) 1000 UNITS capsule Take 1,000 Units by mouth daily.       . colchicine 0.6 MG tablet Take 1 tablet (0.6 mg total) by mouth daily.  30 tablet  0  . diltiazem (CARDIZEM CD) 240 MG 24 hr capsule Take one capsule by mouth twice daily.  60 capsule  6  . esomeprazole (NEXIUM) 40 MG capsule Take 1 capsule (40 mg total) by mouth daily.  30 capsule  3  . Grape Seed 100 MG CAPS Take 1 capsule by mouth daily.       . indomethacin (INDOCIN) 50 MG capsule Take 1 capsule (50 mg total) by mouth 2 (two) times daily with a meal.  60 capsule  0  . Omega-3 Fatty Acids (FISH OIL) 1000 MG CAPS Take 1,000 mg by mouth daily.       . ramipril (ALTACE) 10 MG capsule Take 1 tablet by mouth daily.      Marland Kitchen warfarin (COUMADIN) 5 MG tablet Take 2.5 mg by mouth daily.      Marland Kitchen zolpidem (AMBIEN) 10 MG tablet Take 1 tablet (10 mg total) by mouth at bedtime. For sleep  30 tablet  5      Review of Systems System review is negative for any constitutional, cardiac, pulmonary, GI or neuro symptoms or complaints other than as described in the HPI.     Objective:   Physical Exam Filed Vitals:   02/10/12 1623  BP: 102/68  Pulse: 89  Temp: 98.4 F (36.9 C)  Resp: 12   Gen'l - WNWD white woman, very fair skinned HEENT- C&S clear Cor - RRR, no rub appreciated Abd - BS+, no guarding or rebound, no tenderness to palpation.       Assessment & Plan:

## 2012-02-12 NOTE — Assessment & Plan Note (Signed)
Patient had presented with pleuritic chest pain, admitted to cardiology. Had elevated CRP and pericardial effusion leading to diagnosis of Pericarditis with small effusion. At d/c she was to take Colchicine daily and indomethacin. In addition, for GI protection, she was started on Omeprazole 40 mg qAM. In the interval since d/c her chest pain is better. She denies any GI irritation.  Plan  follow up with Dr. Graciela Husbands - he will determine duration of treatment.

## 2012-02-14 ENCOUNTER — Telehealth: Payer: Self-pay | Admitting: Internal Medicine

## 2012-02-14 ENCOUNTER — Inpatient Hospital Stay (HOSPITAL_COMMUNITY)
Admission: EM | Admit: 2012-02-14 | Discharge: 2012-02-22 | DRG: 314 | Disposition: A | Discharge status: Home / Self Care | Payer: Medicare Other | Attending: Cardiology | Admitting: Cardiology

## 2012-02-14 ENCOUNTER — Encounter (HOSPITAL_COMMUNITY): Payer: Self-pay | Admitting: *Deleted

## 2012-02-14 ENCOUNTER — Emergency Department (HOSPITAL_COMMUNITY): Payer: Medicare Other

## 2012-02-14 DIAGNOSIS — M899 Disorder of bone, unspecified: Secondary | ICD-10-CM | POA: Diagnosis present

## 2012-02-14 DIAGNOSIS — E876 Hypokalemia: Secondary | ICD-10-CM | POA: Diagnosis not present

## 2012-02-14 DIAGNOSIS — I319 Disease of pericardium, unspecified: Principal | ICD-10-CM | POA: Diagnosis present

## 2012-02-14 DIAGNOSIS — D649 Anemia, unspecified: Secondary | ICD-10-CM | POA: Diagnosis present

## 2012-02-14 DIAGNOSIS — Z85828 Personal history of other malignant neoplasm of skin: Secondary | ICD-10-CM

## 2012-02-14 DIAGNOSIS — I4892 Unspecified atrial flutter: Secondary | ICD-10-CM

## 2012-02-14 DIAGNOSIS — Z7901 Long term (current) use of anticoagulants: Secondary | ICD-10-CM

## 2012-02-14 DIAGNOSIS — R059 Cough, unspecified: Secondary | ICD-10-CM

## 2012-02-14 DIAGNOSIS — R05 Cough: Secondary | ICD-10-CM

## 2012-02-14 DIAGNOSIS — I4891 Unspecified atrial fibrillation: Secondary | ICD-10-CM | POA: Diagnosis present

## 2012-02-14 DIAGNOSIS — Z79899 Other long term (current) drug therapy: Secondary | ICD-10-CM

## 2012-02-14 DIAGNOSIS — N809 Endometriosis, unspecified: Secondary | ICD-10-CM | POA: Diagnosis present

## 2012-02-14 DIAGNOSIS — G4700 Insomnia, unspecified: Secondary | ICD-10-CM | POA: Diagnosis present

## 2012-02-14 DIAGNOSIS — Z8673 Personal history of transient ischemic attack (TIA), and cerebral infarction without residual deficits: Secondary | ICD-10-CM

## 2012-02-14 DIAGNOSIS — E871 Hypo-osmolality and hyponatremia: Secondary | ICD-10-CM | POA: Diagnosis present

## 2012-02-14 DIAGNOSIS — I5031 Acute diastolic (congestive) heart failure: Secondary | ICD-10-CM | POA: Diagnosis present

## 2012-02-14 DIAGNOSIS — I10 Essential (primary) hypertension: Secondary | ICD-10-CM | POA: Diagnosis present

## 2012-02-14 HISTORY — DX: Anxiety disorder, unspecified: F41.9

## 2012-02-14 LAB — CBC WITH DIFFERENTIAL/PLATELET
Basophils Absolute: 0 10*3/uL (ref 0.0–0.1)
Basophils Relative: 1 % (ref 0–1)
Eosinophils Absolute: 0.1 10*3/uL (ref 0.0–0.7)
Eosinophils Relative: 1 % (ref 0–5)
HCT: 33.5 % — ABNORMAL LOW (ref 36.0–46.0)
Hemoglobin: 11.4 g/dL — ABNORMAL LOW (ref 12.0–15.0)
Lymphocytes Relative: 18 % (ref 12–46)
Lymphs Abs: 1.5 K/uL (ref 0.7–4.0)
MCH: 31.8 pg (ref 26.0–34.0)
MCHC: 34 g/dL (ref 30.0–36.0)
MCV: 93.3 fL (ref 78.0–100.0)
Monocytes Absolute: 0.7 K/uL (ref 0.1–1.0)
Monocytes Relative: 9 % (ref 3–12)
Neutro Abs: 5.9 K/uL (ref 1.7–7.7)
Neutrophils Relative %: 72 % (ref 43–77)
Platelets: 428 10*3/uL — ABNORMAL HIGH (ref 150–400)
RBC: 3.59 MIL/uL — ABNORMAL LOW (ref 3.87–5.11)
RDW: 13.6 % (ref 11.5–15.5)
WBC: 8.2 10*3/uL (ref 4.0–10.5)

## 2012-02-14 LAB — BASIC METABOLIC PANEL WITH GFR
BUN: 22 mg/dL (ref 6–23)
Chloride: 96 meq/L (ref 96–112)
Creatinine, Ser: 0.6 mg/dL (ref 0.50–1.10)
GFR calc Af Amer: >90 mL/min (ref >90)
Glucose, Bld: 114 mg/dL — ABNORMAL HIGH (ref 70–99)
Potassium: 4.4 meq/L (ref 3.5–5.1)

## 2012-02-14 LAB — BASIC METABOLIC PANEL
CO2: 20 mEq/L (ref 19–32)
Calcium: 8.9 mg/dL (ref 8.4–10.5)
GFR calc non Af Amer: 88 mL/min — ABNORMAL LOW (ref >90)
Sodium: 129 mEq/L — ABNORMAL LOW (ref 135–145)

## 2012-02-14 LAB — APTT: aPTT: 52 s — ABNORMAL HIGH (ref 24–37)

## 2012-02-14 LAB — TROPONIN I
Troponin I: <0.3 ng/mL (ref <0.30)
Troponin I: <0.3 ng/mL (ref <0.30)

## 2012-02-14 LAB — PROTIME-INR
INR: 3.12 — ABNORMAL HIGH (ref 0.00–1.49)
Prothrombin Time: 30.4 seconds — ABNORMAL HIGH (ref 11.6–15.2)

## 2012-02-14 IMAGING — CR DG CHEST 2V
2 series · 2 of 2 positions shown · non-contrast
Comparison: [DATE]

CLINICAL DATA: Shortness of breath.  Weakness.

CHEST - 2 VIEW

[w chest pa]
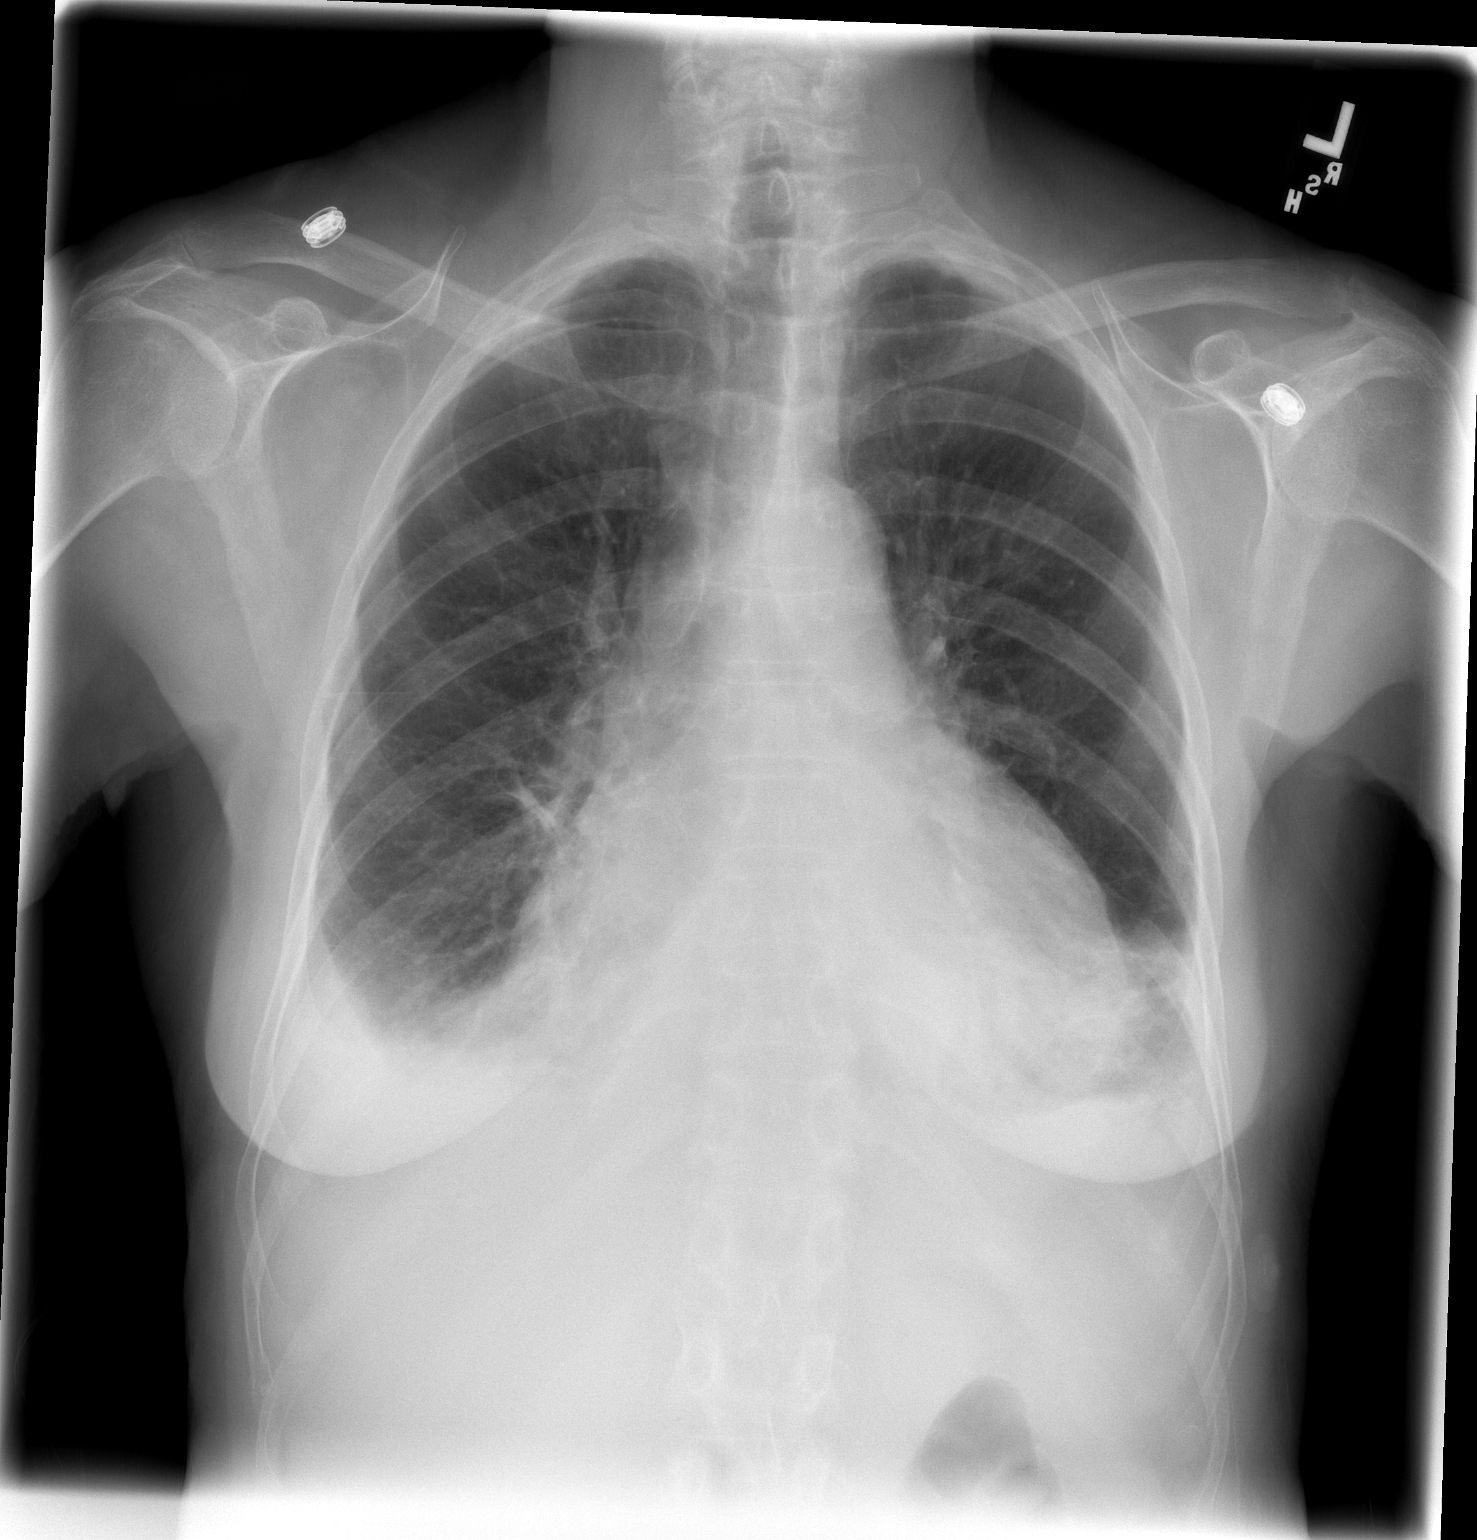

[w chest lat]
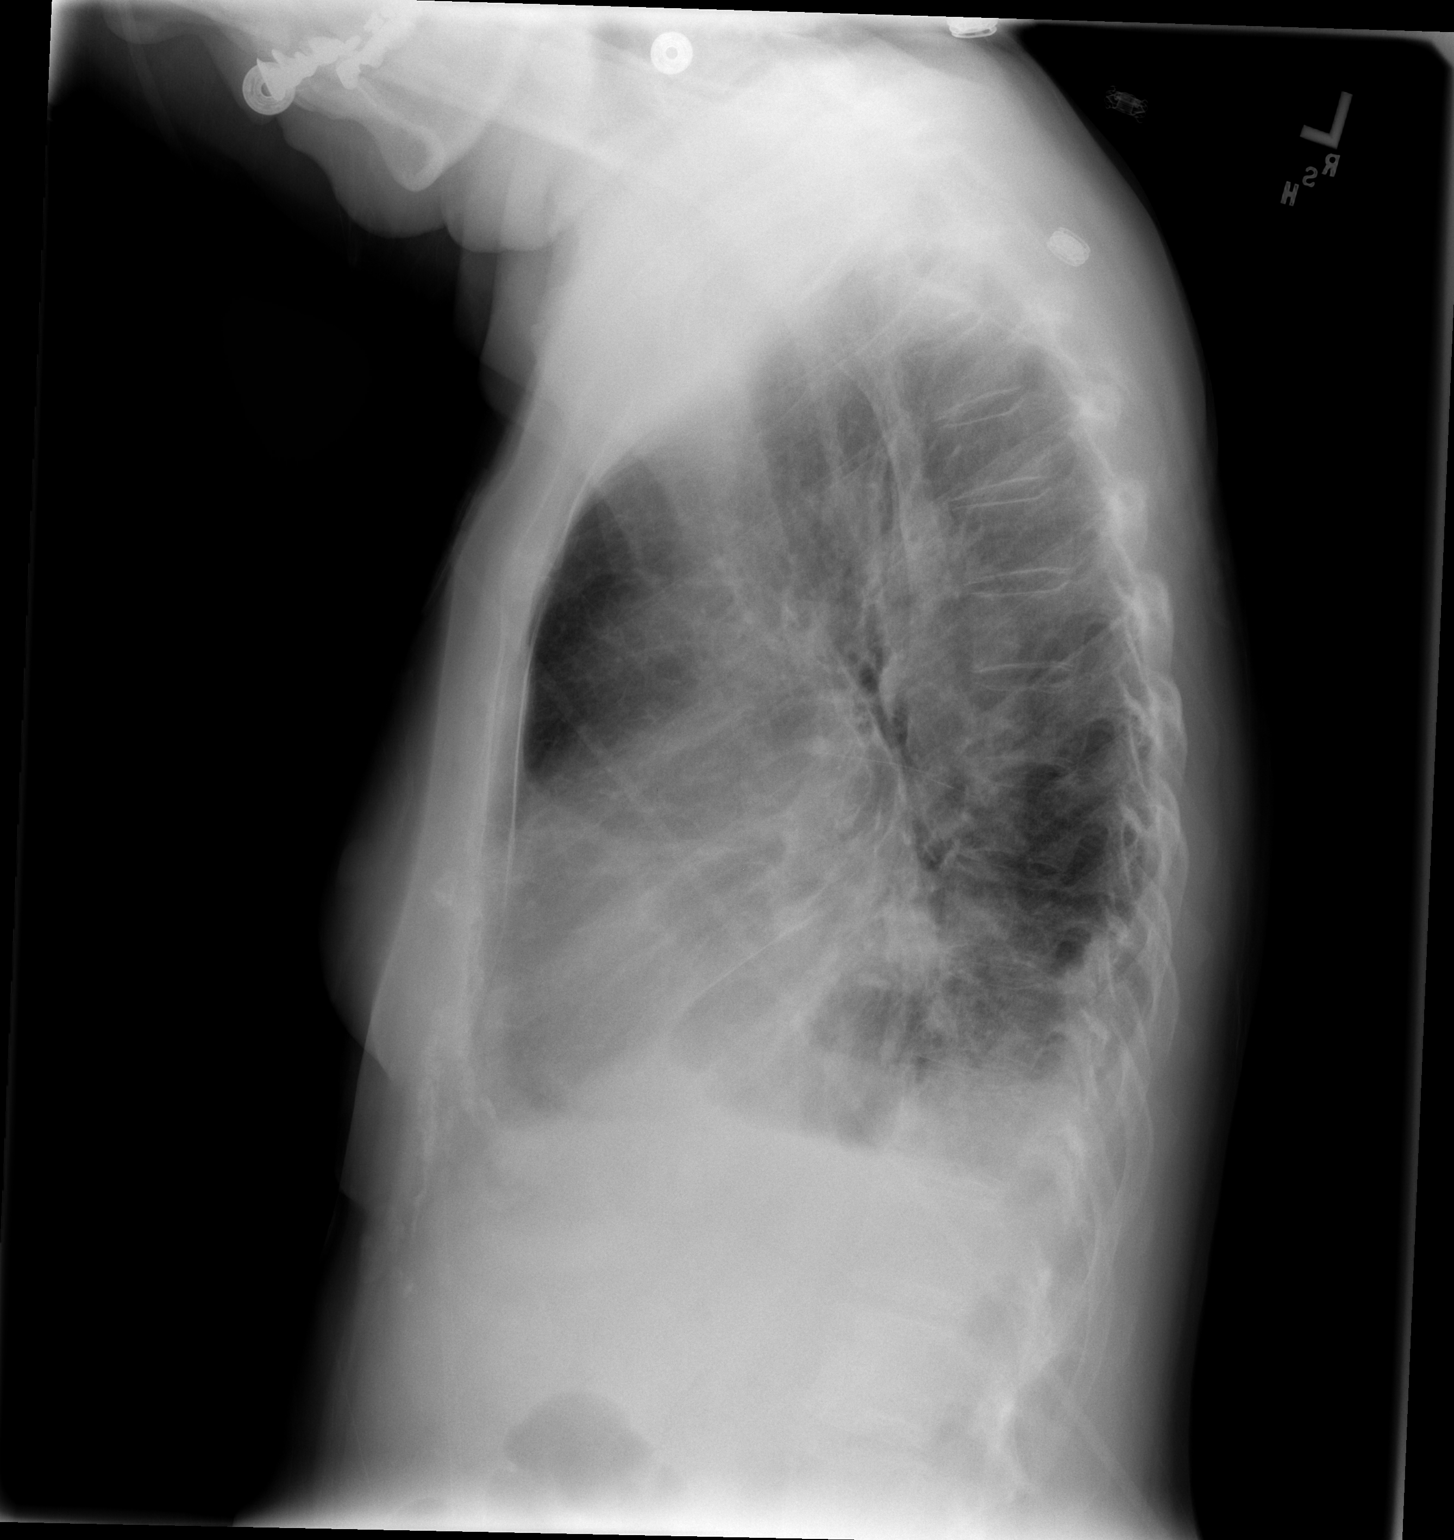

[2 of 2 positions shown; findings below may reference images not displayed]

FINDINGS: Moderate cardiomegaly stable.  There are new small
bilateral pleural effusions.  Bibasilar infiltrates or atelectasis
are also seen.  No definite evidence of congestive heart failure.
IMPRESSION: 1.  New bibasilar infiltrates versus atelectasis, and small
bilateral pleural effusions.
2.  Stable cardiomegaly.

## 2012-02-14 MED ORDER — ACETAMINOPHEN 325 MG PO TABS: 650.0000 mg | ORAL_TABLET | ORAL | Status: DC | PRN

## 2012-02-14 MED ORDER — NITROGLYCERIN 0.4 MG SL SUBL: 0.4000 mg | SUBLINGUAL_TABLET | SUBLINGUAL | Status: DC | PRN

## 2012-02-14 MED ORDER — COLCHICINE 0.6 MG PO TABS
0.6000 mg | ORAL_TABLET | Freq: Every day | ORAL | Status: DC
  Administered 2012-02-15 – 2012-02-22 (×8): 0.6 mg via ORAL
  Filled 2012-02-14 (×8): qty 1

## 2012-02-14 MED ORDER — SODIUM CHLORIDE 0.9 % IV BOLUS (SEPSIS)
500.0000 mL | Freq: Once | INTRAVENOUS | Status: AC
  Administered 2012-02-14: 500 mL via INTRAVENOUS

## 2012-02-14 MED ORDER — SODIUM CHLORIDE 0.9 % IJ SOLN: 3.0000 mL | INTRAMUSCULAR | Status: DC | PRN

## 2012-02-14 MED ORDER — PANTOPRAZOLE SODIUM 40 MG PO TBEC
40.0000 mg | DELAYED_RELEASE_TABLET | Freq: Every day | ORAL | Status: DC
  Administered 2012-02-15 – 2012-02-22 (×8): 40 mg via ORAL
  Filled 2012-02-14 (×8): qty 1

## 2012-02-14 MED ORDER — OMEGA-3-ACID ETHYL ESTERS 1 G PO CAPS
1.0000 g | ORAL_CAPSULE | Freq: Every day | ORAL | Status: DC
  Administered 2012-02-15 – 2012-02-22 (×8): 1 g via ORAL
  Filled 2012-02-14 (×8): qty 1

## 2012-02-14 MED ORDER — ZOLPIDEM TARTRATE 5 MG PO TABS
10.0000 mg | ORAL_TABLET | Freq: Every day | ORAL | Status: DC
Start: 2012-02-14 — End: 2012-02-14

## 2012-02-14 MED ORDER — FUROSEMIDE 10 MG/ML IJ SOLN
40.0000 mg | Freq: Every day | INTRAMUSCULAR | Status: DC
  Administered 2012-02-14 – 2012-02-15 (×2): 40 mg via INTRAVENOUS
  Filled 2012-02-14 (×3): qty 4

## 2012-02-14 MED ORDER — VITAMIN D 1000 UNITS PO TABS
1000.0000 [IU] | ORAL_TABLET | Freq: Every day | ORAL | Status: DC
  Administered 2012-02-15 – 2012-02-22 (×8): 1000 [IU] via ORAL
  Filled 2012-02-14 (×9): qty 1

## 2012-02-14 MED ORDER — SODIUM CHLORIDE 0.9 % IV SOLN: 250.0000 mL | INTRAVENOUS | Status: DC | PRN

## 2012-02-14 MED ORDER — ALPRAZOLAM 0.5 MG PO TABS
0.5000 mg | ORAL_TABLET | Freq: Two times a day (BID) | ORAL | Status: DC | PRN
  Administered 2012-02-14: 0.5 mg via ORAL
  Filled 2012-02-14: qty 1

## 2012-02-14 MED ORDER — DILTIAZEM HCL 100 MG IV SOLR
5.0000 mg/h | INTRAVENOUS | Status: DC
  Administered 2012-02-14: 10 mg/h via INTRAVENOUS
  Administered 2012-02-14 – 2012-02-16 (×4): 12 mg/h via INTRAVENOUS
  Filled 2012-02-14 (×2): qty 100

## 2012-02-14 MED ORDER — INDOMETHACIN 50 MG PO CAPS
50.0000 mg | ORAL_CAPSULE | Freq: Two times a day (BID) | ORAL | Status: DC
  Administered 2012-02-15 (×2): 50 mg via ORAL
  Filled 2012-02-14 (×5): qty 1

## 2012-02-14 MED ORDER — ZOLPIDEM TARTRATE 5 MG PO TABS: 5.0000 mg | ORAL_TABLET | Freq: Every day | ORAL | Status: DC

## 2012-02-14 MED ORDER — DILTIAZEM HCL 100 MG IV SOLR
5.0000 mg/h | Freq: Once | INTRAVENOUS | Status: AC
  Administered 2012-02-14: 5 mg/h via INTRAVENOUS
  Filled 2012-02-14: qty 100

## 2012-02-14 MED ORDER — SODIUM CHLORIDE 0.9 % IJ SOLN
3.0000 mL | Freq: Two times a day (BID) | INTRAMUSCULAR | Status: DC
  Administered 2012-02-14 – 2012-02-22 (×13): 3 mL via INTRAVENOUS

## 2012-02-14 MED ORDER — WARFARIN - PHARMACIST DOSING INPATIENT: Freq: Every day | Status: DC

## 2012-02-14 MED ORDER — B COMPLEX-C PO TABS
1.0000 | ORAL_TABLET | Freq: Every day | ORAL | Status: DC
  Administered 2012-02-15 – 2012-02-22 (×8): 1 via ORAL
  Filled 2012-02-14 (×8): qty 1

## 2012-02-14 MED ORDER — ZOLPIDEM TARTRATE 5 MG PO TABS
10.0000 mg | ORAL_TABLET | Freq: Every day | ORAL | Status: DC
  Administered 2012-02-14 – 2012-02-21 (×8): 10 mg via ORAL
  Filled 2012-02-14 (×8): qty 2

## 2012-02-14 MED ORDER — ONDANSETRON HCL 4 MG/2ML IJ SOLN: 4.0000 mg | Freq: Four times a day (QID) | INTRAMUSCULAR | Status: DC | PRN

## 2012-02-14 NOTE — Progress Notes (Signed)
Patient's heart rate and respiratory rate improved since oxygen started.  Patient continues to be anxious.  Daughter states that this is her Mom's baseline.  Discussed anxiety with patient, and she is willing to try something for anxiety.  Will request order from MD.

## 2012-02-14 NOTE — Telephone Encounter (Signed)
Pt having in both feet and ankles, as well as SOB for the  last two days, feels miserable and wants appt asap

## 2012-02-14 NOTE — Progress Notes (Signed)
Patient anxious.  Noted to be tachypneic with resp rate 32-36 with O2 sat staying at 90 after coughing spell.  Heart rate increased to 140s during this time.  Oxygen started at 2 liters per nasal cannula.  Comfort measures given to pt.  Will continue to monitor pt closely.

## 2012-02-14 NOTE — ED Notes (Signed)
Pt reports being in the hospital last week and was treated for pericarditis.  Reports increasing SOB and fatigue over the last several days.  Pt reports a resolve in pain.

## 2012-02-14 NOTE — ED Provider Notes (Signed)
History     CSN: [id number]  Arrival date & time 02/14/12  1139   First MD Initiated Contact with Patient 02/14/12 1205      Chief Complaint  Patient presents with  . Shortness of Breath    (Consider location/radiation/quality/duration/timing/severity/associated sxs/prior treatment) HPI Comments: Patient reports gradual onset of shortness of breath, fatigue and decreased energy over the last 2-3 days. She denies any chest pain, denies any palpitations. She has a history of paroxysmal atrial fibrillation for which she is on Coumadin. She indicates that she met with Dr. Kirstie Peri, her primary care physician and recently was taken off all of her rate controlling medications. Over the last several months she has been battling some kind of diffuse, systemic inflammatory condition that she has been on prednisone for her. She reports that she had diffuse arthralgias and recently was told that she had a pericardial effusion with admission to Dr. Odessa Fleming service about a week and a half ago. At that time she was admitted for atrial fibrillation with rapid ventricular response. She indicates that she has been cardioverted twice in the past. She also has a history of hypertension and has had a TIA in the past. She denies any recent cold symptoms but does have a dry cough for the last few days. Her medications doing include Ramipril. She denies any back pain, abdominal pain. No recent nausea vomiting or diarrhea. She denies any fever or chills, rhinorrhea or nasal congestion.  Patient is a 77 y.o. female presenting with shortness of breath. The history is provided by the patient, a relative and medical records.  Shortness of Breath  Associated symptoms include cough and shortness of breath. Pertinent negatives include no chest pain, no fever and no rhinorrhea.    Past Medical History  Diagnosis Date  . TIA (transient ischemic attack)   . PAF (paroxysmal atrial fibrillation)     has been on Flecainide in  the past. Stopped 02/10/11; Remains on coumadin anticoagulation; intol to Rythmol and failed DCCV; noted to be in AFlutter 5/13;  Echo 01/2011:  EF 55-60%, mild MR, mild LAE.  Myoview 6/12:  No ischemia, EF 74%  . HTN (hypertension)   . Endometriosis   . Osteopenia   . Insomnia   . Chronic anticoagulation     on coumadin    Past Surgical History  Procedure Date  . Polypectomy   . Total hysterectomy and bilateral salpingoopherectomy   . Appendectomy   . Skin cancer removal   . Cardioversion 01/14/2011    Procedure: CARDIOVERSION;  Surgeon: Duke Salvia, MD;  Location: [facility] OR;  Service: Cardiovascular;  Laterality: N/A;  To be completed in Neuro OR 33 time slot 0830 12/20  . Cardioversion 05/27/2011    Procedure: CARDIOVERSION;  Surgeon: Duke Salvia, MD;  Location: [facility] OR;  Service: Cardiovascular;  Laterality: N/A;    Family History  Problem Relation Age of Onset  . Heart disease      Father age 57    History  Substance Use Topics  . Smoking status: Never Smoker   . Smokeless tobacco: Not on file  . Alcohol Use: 8.4 oz/week    14 Glasses of wine per week     Comment: 1-2 glasses of wine nightly    OB History    Grav Para Term Preterm Abortions TAB SAB Ect Mult Living                  Review of Systems  Constitutional: Positive for  fatigue. Negative for fever and chills.  HENT: Negative for congestion and rhinorrhea.   Respiratory: Positive for cough and shortness of breath. Negative for chest tightness.   Cardiovascular: Negative for chest pain.  Gastrointestinal: Negative for abdominal pain.  Musculoskeletal: Negative for back pain.  Neurological: Positive for weakness. Negative for dizziness, syncope, numbness and headaches.    Allergies  Other and Procaine hcl  Home Medications   Current Outpatient Rx  Name  Route  Sig  Dispense  Refill  . ACETAMINOPHEN 325 MG PO TABS   Oral   Take 650 mg by mouth every 4 (four) hours as needed. For pain and  inflammation         . B COMPLEX-C PO TABS   Oral   Take 1 tablet by mouth daily.           Marland Kitchen VITAMIN D 1000 UNITS PO CAPS   Oral   Take 1,000 Units by mouth daily.          . COLCHICINE 0.6 MG PO TABS   Oral   Take 1 tablet (0.6 mg total) by mouth daily.   30 tablet   0   . DILTIAZEM HCL ER 240 MG PO CP24   Oral   Take 240 mg by mouth 2 (two) times daily.         Marland Kitchen ESOMEPRAZOLE MAGNESIUM 40 MG PO CPDR   Oral   Take 1 capsule (40 mg total) by mouth daily.   30 capsule   3   . GRAPE SEED 100 MG PO CAPS   Oral   Take 1 capsule by mouth daily.          . INDOMETHACIN 50 MG PO CAPS   Oral   Take 1 capsule (50 mg total) by mouth 2 (two) times daily with a meal.   60 capsule   0   . FISH OIL 1000 MG PO CAPS   Oral   Take 1,000 mg by mouth daily.          Marland Kitchen RAMIPRIL 10 MG PO CAPS   Oral   Take 1 tablet by mouth daily.         . WARFARIN SODIUM 5 MG PO TABS   Oral   Take 2.5 mg by mouth daily.         Marland Kitchen ZOLPIDEM TARTRATE 10 MG PO TABS   Oral   Take 1 tablet (10 mg total) by mouth at bedtime. For sleep   30 tablet   5     BP 108/92  Pulse 108  Temp 98.2 F (36.8 C)  Resp 25  SpO2 93%  Physical Exam  Nursing note and vitals reviewed. Constitutional: She appears well-developed and well-nourished.  HENT:  Head: Normocephalic and atraumatic.  Eyes: Pupils are equal, round, and reactive to light.  Cardiovascular: S1 normal, S2 normal and intact distal pulses.  An irregularly irregular rhythm present. Tachycardia present.  Exam reveals no friction rub.   No murmur heard. Pulmonary/Chest: Effort normal.  Abdominal: Soft. She exhibits no distension. There is no tenderness. There is no rebound and no guarding.  Neurological: She is alert.  Skin: Skin is warm and dry.    ED Course  Procedures (including critical care time)  CRITICAL CARE Performed by: Lear Ng.   Total critical care time: 30 min  Critical care time was exclusive  of separately billable procedures and treating other patients.  Critical care was necessary to treat or prevent imminent  or life-threatening deterioration.  Critical care was time spent personally by me on the following activities: development of treatment plan with patient and/or surrogate as well as nursing, discussions with consultants, evaluation of patient's response to treatment, examination of patient, obtaining history from patient or surrogate, ordering and performing treatments and interventions, ordering and review of laboratory studies, ordering and review of radiographic studies, pulse oximetry and re-evaluation of patient's condition.   Labs Reviewed  CBC WITH DIFFERENTIAL - Abnormal; Notable for the following:    RBC 3.59 (*)     Hemoglobin 11.4 (*)     HCT 33.5 (*)     Platelets 428 (*)     All other components within normal limits  BASIC METABOLIC PANEL - Abnormal; Notable for the following:    Sodium 129 (*)     Glucose, Bld 114 (*)     GFR calc non Af Amer 88 (*)     All other components within normal limits  PROTIME-INR - Abnormal; Notable for the following:    Prothrombin Time 30.4 (*)     INR 3.12 (*)     All other components within normal limits  APTT - Abnormal; Notable for the following:    aPTT 52 (*)     All other components within normal limits  TROPONIN I   Dg Chest 2 View  02/14/2012  *RADIOLOGY REPORT*  Clinical Data: Shortness of breath.  Weakness.  CHEST - 2 VIEW  Comparison: 02/05/2012  Findings: Moderate cardiomegaly stable.  There are new small bilateral pleural effusions.  Bibasilar infiltrates or atelectasis are also seen.  No definite evidence of congestive heart failure.  IMPRESSION:  1.  New bibasilar infiltrates versus atelectasis, and small bilateral pleural effusions. 2.  Stable cardiomegaly.   Original Report Authenticated By: Myles Rosenthal, M.D.      1. Atrial flutter with rapid ventricular response   2. Hyponatremia   3. Cough   4.  Atrial fibrillation   5. Pericarditis with effusion     Room air saturation is 94% which is interpreted as adequate.  ECG at time 11:46, shows atrial flutter with variable AV block at a rate of 107. Inverted T waves are noted inferiorly and laterally. Axis is normal. I interpret this to be an abnormal ECG.    4:29 PM On diltiazem drip, rate is improved to 108.  BP is stable.  Dr. Jens Som with cardiology has seen pt. MDM  Patient is currently in atrial flutter with rapid ventricular response which likely is cause of her shortness of breath. She is afebrile but does have a dry cough. She does not have any significant unusual lung sounds on auscultation. Plan is to obtain routine labs including an INR as she is on Coumadin as well as obtaining a two-view chest x-ray. My plan then would be to contact the [facility] cardiology for consultation. In the meantime I plan on putting her on rate control with diltiazem drip. I reviewed her most recent admission discharge summary by [facility] cardiology. They're also phone records with the [facility] cardiology which indicates that Dr. Tedra Senegal was contacted who indicated the patient likely should come to the [facility] department for repeat evaluation and workup including CT and ECHO.          Gavin Pound. Ethaniel Garfield, MD 02/14/12 1630

## 2012-02-14 NOTE — Progress Notes (Signed)
ANTICOAGULATION CONSULT NOTE - Initial Consult  Pharmacy Consult for Coumadin Indication: atrial fibrillation  Allergies  Allergen Reactions  . Other Other (See Comments)    Novocaine.  REACTION:  Rapid heart rate  . Procaine Hcl     Rapid heart rate  Patient Measurements: Weight from 02/10/12 - 59.9 kg Height from 02/10/12 - 159 cm Vital Signs: Temp: 98.8 F (37.1 C) (01/20 1830) Temp src: Oral (01/20 1830) BP: 131/53 mmHg (01/20 1900) Pulse Rate: 145  (01/20 1900) Labs:  Basename 02/14/12 1229  HGB 11.4*  HCT 33.5*  PLT 428*  APTT 52*  LABPROT 30.4*  INR 3.12*  HEPARINUNFRC --  CREATININE 0.60  CKTOTAL --  CKMB --  TROPONINI <0.30   The CrCl is unknown because both a height and weight (above a minimum accepted value) are required for this calculation.   Medical History: Past Medical History  Diagnosis Date  . TIA (transient ischemic attack)   . PAF (paroxysmal atrial fibrillation)     has been on Flecainide in the past. Stopped 02/10/11; Remains on coumadin anticoagulation; intol to Rythmol and failed DCCV; noted to be in AFlutter 5/13;  Echo 01/2011:  EF 55-60%, mild MR, mild LAE.  Myoview 6/12:  No ischemia, EF 74%  . HTN (hypertension)   . Endometriosis   . Osteopenia   . Insomnia   . Chronic anticoagulation     on coumadin   Medications:  Prescriptions prior to admission  Medication Sig Dispense Refill  . acetaminophen (TYLENOL) 325 MG tablet Take 650 mg by mouth every 4 (four) hours as needed. For pain and inflammation      . B Complex-C (B-COMPLEX WITH VITAMIN C) tablet Take 1 tablet by mouth daily.        . Cholecalciferol (VITAMIN D) 1000 UNITS capsule Take 1,000 Units by mouth daily.       . colchicine 0.6 MG tablet Take 1 tablet (0.6 mg total) by mouth daily.  30 tablet  0  . diltiazem (DILACOR XR) 240 MG 24 hr capsule Take 240 mg by mouth 2 (two) times daily.      Marland Kitchen esomeprazole (NEXIUM) 40 MG capsule Take 1 capsule (40 mg total) by mouth daily.  30  capsule  3  . Grape Seed 100 MG CAPS Take 1 capsule by mouth daily.       . indomethacin (INDOCIN) 50 MG capsule Take 1 capsule (50 mg total) by mouth 2 (two) times daily with a meal.  60 capsule  0  . Omega-3 Fatty Acids (FISH OIL) 1000 MG CAPS Take 1,000 mg by mouth daily.       . ramipril (ALTACE) 10 MG capsule Take 1 tablet by mouth daily.      Marland Kitchen warfarin (COUMADIN) 5 MG tablet Take 2.5 mg by mouth daily.      Marland Kitchen zolpidem (AMBIEN) 10 MG tablet Take 1 tablet (10 mg total) by mouth at bedtime. For sleep  30 tablet  5    Assessment: 76 YOF on chronic Coumadin prior to admission for paroxysmal atrial fibrillation now admitted for increased DOE, orthopnea, and pedal edema. Patient's home regimen was Coumadin 2.5mg  po daily and her last dose was on 02/14/12.   Her INR on admission today is 3.12- above goal of 2-3.  Her H/H is wnl and her platelets are elevated at 428.   Her Coumadin is followed as an outpatient by [facility] and her last visit was on 02/06/12 at which her INR  was therapeutic and she was continued on her home regimen.   Goal of Therapy:  INR 2-3   Plan:  1. No Coumadin tonight due to elevated INR and last dose today. 2. Follow-up PT/INR in AM and daily per protocol.  Link Snuffer, PharmD, BCPS Clinical Pharmacist [phone] 02/14/2012,7:35 PM

## 2012-02-14 NOTE — Telephone Encounter (Signed)
Called and spoke with patient, she was admitted 02/05/12 with suspected Pericarditis and put on Indocin 50mg  bid, Colchicine 0.6mg  qd and Nexium 20mg  daily of which was increased on 02/10/12 to bid by Dr Arthur Holms.  She says since Friday she has been unable to get a deep breath and is now having swelling in her feet that is making it difficult to walk.  Her BNP was 603 at discharge.  I discussed with Dr Riley Kill who is DOD and he says that she is going to need to go to the ER.  As she is going to need a workup and will likely need repeat echo, CT and labs.  I have spoke with patient and she is going to the ER, Trish aware

## 2012-02-14 NOTE — ED Notes (Signed)
Meal tray ordered 

## 2012-02-14 NOTE — H&P (Signed)
HPI: 77 year old female with past medical history of paroxysmal atrial fibrillation and hypertension admitted with dyspnea and atrial fibrillation. Patient recently admitted with resume diagnosis of pericarditis. Echocardiogram in January of 2014 showed normal LV function, trace mitral regurgitation, mild tricuspid regurgitation and a small to moderate pericardial effusion. Patient was in atrial fibrillation during that admission. She was discharged with Indocin, colchicine and Cardizem for rate control. Note CT angiogram on January 11 of 2014 showed no pulmonary embolus. There is a small pericardial effusion and small bilateral effusions with interstitial edema. Sedimentation rate during that admission was 42. ANA in December of 2013 was normal. Patient was discharged and was symptomatically improved. However over the past several days she has developed increasing dyspnea on exertion. There is also orthopnea and pedal edema. Her previous chest pain is much better. She denies fevers or chills. No palpitations.   (Not in a hospital admission)  Allergies  Allergen Reactions  . Other Other (See Comments)    Novocaine.  REACTION:  Rapid heart rate  . Procaine Hcl     Rapid heart rate    Past Medical History  Diagnosis Date  . TIA (transient ischemic attack)   . PAF (paroxysmal atrial fibrillation)     has been on Flecainide in the past. Stopped 02/10/11; Remains on coumadin anticoagulation; intol to Rythmol and failed DCCV; noted to be in AFlutter 5/13;  Echo 01/2011:  EF 55-60%, mild MR, mild LAE.  Myoview 6/12:  No ischemia, EF 74%  . HTN (hypertension)   . Endometriosis   . Osteopenia   . Insomnia   . Chronic anticoagulation     on coumadin    Past Surgical History  Procedure Date  . Polypectomy   . Total hysterectomy and bilateral salpingoopherectomy   . Appendectomy   . Skin cancer removal   . Cardioversion 01/14/2011    Procedure: CARDIOVERSION;  Surgeon: Duke Salvia, MD;   Location: [facility] OR;  Service: Cardiovascular;  Laterality: N/A;  To be completed in Neuro OR 33 time slot 0830 12/20  . Cardioversion 05/27/2011    Procedure: CARDIOVERSION;  Surgeon: Duke Salvia, MD;  Location: [facility] OR;  Service: Cardiovascular;  Laterality: N/A;    History   Social History  . Marital Status: Married    Spouse Name: N/A    Number of Children: N/A  . Years of Education: N/A   Occupational History  . Not on file.   Social History Main Topics  . Smoking status: Never Smoker   . Smokeless tobacco: Not on file  . Alcohol Use: 8.4 oz/week    14 Glasses of wine per week     Comment: 1-2 glasses of wine nightly  . Drug Use: No  . Sexually Active: Not Currently   Other Topics Concern  . 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    Family History  Problem Relation Age of Onset  . Heart disease      Father age 40    ROS:  no fevers or chills, productive cough, hemoptysis, dysphasia, odynophagia, melena, hematochezia, dysuria, hematuria, rash, seizure activity, orthopnea, PND, claudication. Remaining systems are negative.  Physical Exam:   Blood pressure 108/92, pulse 108, temperature 98.2 F (36.8 C), resp. rate 25, SpO2 93.00%.  General:  Well developed/well nourished  in NAD Skin warm/dry Patient not depressed No peripheral clubbing Back-normal HEENT-normal/normal eyelids Neck supple/normal carotid upstroke bilaterally; no bruits; no JVD; no thyromegaly chest - mildly diminished breath sounds at the bases CV - tachycardic and irregular Abdomen -NT/ND, no HSM, no mass, + bowel sounds, no bruit 2+ femoral pulses, no bruits Ext-1+ edema, no chords, 2+ DP Neuro-grossly nonfocal  ECG atrial flutter with nonspecific ST changes.  Results for orders placed during  the hospital encounter of 02/14/12 (from the past 48 hour(s))  CBC WITH DIFFERENTIAL     Status: Abnormal   Collection Time   02/14/12 12:29 PM      Component Value Range Comment   WBC 8.2  4.0 - 10.5 K/uL    RBC 3.59 (*) 3.87 - 5.11 MIL/uL    Hemoglobin 11.4 (*) 12.0 - 15.0 g/dL    HCT 16.1 (*) 09.6 - 46.0 %    MCV 93.3  78.0 - 100.0 fL    MCH 31.8  26.0 - 34.0 pg    MCHC 34.0  30.0 - 36.0 g/dL    RDW 04.5  40.9 - 81.1 %    Platelets 428 (*) 150 - 400 K/uL    Neutrophils Relative 72  43 - 77 %    Neutro Abs 5.9  1.7 - 7.7 K/uL    Lymphocytes Relative 18  12 - 46 %    Lymphs Abs 1.5  0.7 - 4.0 K/uL    Monocytes Relative 9  3 - 12 %    Monocytes Absolute 0.7  0.1 - 1.0 K/uL    Eosinophils Relative 1  0 - 5 %    Eosinophils Absolute 0.1  0.0 - 0.7 K/uL    Basophils Relative 1  0 - 1 %    Basophils Absolute 0.0  0.0 - 0.1 K/uL   BASIC METABOLIC PANEL     Status: Abnormal   Collection Time   02/14/12 12:29 PM      Component Value Range Comment   Sodium 129 (*) 135 - 145 mEq/L    Potassium 4.4  3.5 - 5.1 mEq/L    Chloride 96  96 - 112 mEq/L    CO2 20  19 - 32 mEq/L    Glucose, Bld 114 (*) 70 - 99 mg/dL    BUN 22  6 - 23 mg/dL    Creatinine, Ser 9.14  0.50 - 1.10 mg/dL    Calcium 8.9  8.4 - 78.2 mg/dL    GFR calc non Af Amer 88 (*) >90 mL/min    GFR calc Af Amer >90  >90 mL/min   PROTIME-INR     Status: Abnormal   Collection Time   02/14/12 12:29 PM      Component Value Range Comment   Prothrombin Time 30.4 (*) 11.6 - 15.2 seconds    INR 3.12 (*) 0.00 - 1.49   APTT     Status: Abnormal   Collection Time   02/14/12 12:29 PM      Component Value Range Comment   aPTT 52 (*) 24 - 37 seconds   TROPONIN I     Status: Normal   Collection Time   02/14/12 12:29 PM      Component Value Range Comment   Troponin I <0.30  <0.30 ng/mL     Dg Chest 2 View  02/14/2012  *RADIOLOGY REPORT*  Clinical Data: Shortness of breath.  Weakness.  CHEST - 2 VIEW  Comparison: 02/05/2012  Findings:  Moderate cardiomegaly stable.  There are new small bilateral pleural effusions.  Bibasilar infiltrates or atelectasis are also seen.  No definite evidence of congestive heart failure.  IMPRESSION:  1.  New bibasilar infiltrates versus atelectasis, and small bilateral pleural effusions. 2.  Stable cardiomegaly.   Original Report Authenticated By: Myles Rosenthal, M.D.     Assessment/Plan 1 dyspnea-the patient is volume overloaded on examination. Will diurese and follow renal function. Begin Lasix 40 mg IV daily. 2 atrial fibrillation/flutter-the patient's heart rate was 135-140 when I was examining her. We will initiate IV Cardizem and increase as needed for rate control. This may be contributing to her volume excess. She has been intolerant to multiple antiarrhythmics in the past by report. I will review with Dr. Graciela Husbands. Question amiodarone with cardioversion versus considering ablation. 3 pericarditis-plan to repeat echocardiogram to reassess mild to moderate pericardial effusion noted previously. Continue nonsteroidal and colchicine for now. 4 hypertension-hold ramipril for now. 5 hyponatremia-fluid restrict and follow. 6 normocytic anemia-she will need further evaluation with primary care. Olga Millers MD 02/14/2012, 3:37 PM

## 2012-02-15 ENCOUNTER — Encounter (HOSPITAL_COMMUNITY): Payer: Self-pay | Admitting: Anesthesiology

## 2012-02-15 DIAGNOSIS — I319 Disease of pericardium, unspecified: Secondary | ICD-10-CM

## 2012-02-15 DIAGNOSIS — I5031 Acute diastolic (congestive) heart failure: Secondary | ICD-10-CM | POA: Diagnosis present

## 2012-02-15 LAB — CBC
HCT: 31.6 % — ABNORMAL LOW (ref 36.0–46.0)
MCH: 32.5 pg (ref 26.0–34.0)
MCHC: 35.1 g/dL (ref 30.0–36.0)
MCV: 92.4 fL (ref 78.0–100.0)
RDW: 13.9 % (ref 11.5–15.5)
WBC: 7.2 10*3/uL (ref 4.0–10.5)

## 2012-02-15 LAB — COMPREHENSIVE METABOLIC PANEL
ALT: 31 U/L (ref 0–35)
AST: 27 U/L (ref 0–37)
Albumin: 2.5 g/dL — ABNORMAL LOW (ref 3.5–5.2)
Alkaline Phosphatase: 135 U/L — ABNORMAL HIGH (ref 39–117)
Potassium: 3.8 mEq/L (ref 3.5–5.1)
Sodium: 129 mEq/L — ABNORMAL LOW (ref 135–145)
Total Protein: 6.3 g/dL (ref 6.0–8.3)

## 2012-02-15 LAB — TROPONIN I
Troponin I: <0.3 ng/mL
Troponin I: <0.3 ng/mL

## 2012-02-15 LAB — PROTIME-INR: Prothrombin Time: 30.9 seconds — ABNORMAL HIGH (ref 11.6–15.2)

## 2012-02-15 MED ORDER — AMIODARONE HCL IN DEXTROSE 360-4.14 MG/200ML-% IV SOLN
60.0000 mg/h | INTRAVENOUS | Status: DC
  Administered 2012-02-15 – 2012-02-19 (×13): 60 mg/h via INTRAVENOUS
  Filled 2012-02-15 (×29): qty 200

## 2012-02-15 MED ORDER — ALPRAZOLAM 0.25 MG PO TABS
0.2500 mg | ORAL_TABLET | Freq: Two times a day (BID) | ORAL | Status: DC
  Administered 2012-02-15 – 2012-02-22 (×14): 0.25 mg via ORAL
  Filled 2012-02-15 (×15): qty 1

## 2012-02-15 NOTE — Progress Notes (Signed)
ANTICOAGULATION CONSULT NOTE - Follow Up Consult  Pharmacy Consult for coumadin Indication: atrial fibrillation  Allergies  Allergen Reactions  . Other Other (See Comments)    Novocaine.  REACTION:  Rapid heart rate  . Procaine Hcl     Rapid heart rate    Patient Measurements: Height: 5\' 2"  (157.5 cm) Weight: 138 lb 3.7 oz (62.7 kg) IBW/kg (Calculated) : 50.1   Vital Signs: Temp: 98.9 F (37.2 C) (01/21 0730) Temp src: Oral (01/21 0730) BP: 130/63 mmHg (01/21 0730) Pulse Rate: 109  (01/21 0730)  Labs:  Basename 02/15/12 0200 02/15/12 0130 02/14/12 2007 02/14/12 1229  HGB 11.1* -- -- 11.4*  HCT 31.6* -- -- 33.5*  PLT 395 -- -- 428*  APTT -- -- -- 52*  LABPROT 30.9* -- -- 30.4*  INR 3.19* -- -- 3.12*  HEPARINUNFRC -- -- -- --  CREATININE 0.62 -- -- 0.60  CKTOTAL -- -- -- --  CKMB -- -- -- --  TROPONINI -- <0.30 <0.30 <0.30    Estimated Creatinine Clearance: 53.7 ml/min (by C-G formula based on Cr of 0.62).   Medications:  Scheduled:    . B-complex with vitamin C  1 tablet Oral Daily  . cholecalciferol  1,000 Units Oral Daily  . colchicine  0.6 mg Oral Daily  . [COMPLETED] diltiazem (CARDIZEM) infusion  5-15 mg/hr Intravenous Once  . furosemide  40 mg Intravenous Daily  . indomethacin  50 mg Oral BID WC  . omega-3 acid ethyl esters  1 g Oral Daily  . pantoprazole  40 mg Oral Daily  . [COMPLETED] sodium chloride  500 mL Intravenous Once  . sodium chloride  3 mL Intravenous Q12H  . Warfarin - Pharmacist Dosing Inpatient   Does not apply q1800  . zolpidem  10 mg Oral QHS  . [DISCONTINUED] zolpidem  10 mg Oral QHS  . [DISCONTINUED] zolpidem  5 mg Oral QHS    Assessment: 77 yo female with PAF on coumadin on home and continuing as inpatient. INR today is 3.19 and above goal.  Goal of Therapy:  INR 2-3 Monitor platelets by anticoagulation protocol: Yes   Plan:  -Hold coumadin today -Daily PT/INR  Harland German, Pharm D 02/15/2012 8:41 AM

## 2012-02-15 NOTE — Progress Notes (Signed)
Pt is strict I and O, she insist to have towel in between her legs for voiding purposes.----Dayvin Aber, rn

## 2012-02-15 NOTE — Progress Notes (Signed)
  Echocardiogram 2D Echocardiogram has been performed.  Selena Taylor 02/15/2012, 12:20 PM

## 2012-02-15 NOTE — Care Management Note (Signed)
    Page 1 of 1   02/15/2012     11:05:58 AM   CARE MANAGEMENT NOTE 02/15/2012  Patient:  Selena Taylor, Selena Taylor   Account Number:  [id number]  Date Initiated:  02/15/2012  Documentation initiated by:  Junius Creamer  Subjective/Objective Assessment:   adm w at fib     Action/Plan:   lives w husband, pcp dr Casimiro Needle norins   Anticipated DC Date:     Anticipated DC Plan:        DC Planning Services  CM consult      Choice offered to / List presented to:             Status of service:   Medicare Important Message given?   (If response is "NO", the following Medicare IM given date fields will be blank) Date Medicare IM given:   Date Additional Medicare IM given:    Discharge Disposition:    Per UR Regulation:  Reviewed for med. necessity/level of care/duration of stay  If discussed at Long Length of Stay Meetings, dates discussed:    Comments:  1/21 1105 debbie Gracianna Vink rn,bsn

## 2012-02-15 NOTE — Progress Notes (Signed)
ELECTROPHYSIOLOGY ROUNDING NOTE    Patient Name: Selena Taylor Date of Encounter: 02-15-2012    SUBJECTIVE: Patient states shortness of breath is somewhat improved.  Very fatigued over the last few days.  Chest pain has resolved since previous admission.    Pt with longstanding history of atrial fibrillation, has failed Flecainide in the past.  Intolerant of multiple beta blockers, intolerant of Rhythmol.  Has been maintained on Cardizem for rate control. Anticoagulated with Warfarin.   Last echo 02-05-2012- EF 55-60%, no RWMA, small to moderate pericardial effusion, LA 36  TELEMETRY: Reviewed telemetry pt in atrial fibrillation, rates 90-110's Filed Vitals:   02/15/12 0000 02/15/12 0400 02/15/12 0500 02/15/12 0730  BP: 114/58   130/63  Pulse:    109  Temp: 98.5 F (36.9 C) 98.7 F (37.1 C)  98.9 F (37.2 C)  TempSrc: Oral   Oral  Resp:    25  Height:      Weight:   138 lb 3.7 oz (62.7 kg)   SpO2:    94%  Well developed and nourished in no acute distress HENT normal Neck supple with JVP-8-10 Carotids brisk and full without bruits Clear anteriorly Irregularly irregular rate and rhythm with controlled ventricular response, no murmurs or gallops Abd-soft with active BS without hepatomegaly No Clubbing cyanosis edema Skin-warm and dry A & Oriented  Grossly normal sensory and motor function     Intake/Output Summary (Last 24 hours) at 02/15/12 1046 Last data filed at 02/15/12 0700  Gross per 24 hour  Intake 808.17 ml  Output    876 ml  Net -67.83 ml    LABS: Basic Metabolic Panel:  Basename 02/15/12 0200 02/14/12 1959 02/14/12 1229  NA 129* -- 129*  K 3.8 -- 4.4  CL 94* -- 96  CO2 21 -- 20  GLUCOSE 102* -- 114*  BUN 15 -- 22  CREATININE 0.62 -- 0.60  CALCIUM 8.0* -- 8.9  MG -- 1.8 --  PHOS -- -- --   Liver Function Tests:  Basename 02/15/12 0200  AST 27  ALT 31  ALKPHOS 135*  BILITOT 0.3  PROT 6.3  ALBUMIN 2.5*   CBC:  Basename 02/15/12 0200  02/14/12 1229  WBC 7.2 8.2  NEUTROABS -- 5.9  HGB 11.1* 11.4*  HCT 31.6* 33.5*  MCV 92.4 93.3  PLT 395 428*   Cardiac Enzymes:  Basename 02/15/12 0902 02/15/12 0130 02/14/12 2007  CKTOTAL -- -- --  CKMB -- -- --  CKMBINDEX -- -- --  TROPONINI <0.30 <0.30 <0.30   INR: 3.19  Radiology/Studies:  Dg Chest 2 View 02/14/2012  *RADIOLOGY REPORT*  Clinical Data: Shortness of breath.  Weakness.  CHEST - 2 VIEW  Comparison: 02/05/2012  Findings: Moderate cardiomegaly stable.  There are new small bilateral pleural effusions.  Bibasilar infiltrates or atelectasis are also seen.  No definite evidence of congestive heart failure.  IMPRESSION:  1.  New bibasilar infiltrates versus atelectasis, and small bilateral pleural effusions. 2.  Stable cardiomegaly.   Original Report Authenticated By: Myles Rosenthal, M.D.    Patient Active Hospital Problem List: Atrial fibrillation/flutter (11/03/2006)   Pericarditis with effusion (02/12/2012)  * Acute diastolic heart failure (02/15/2012)   Pt w pericarditis now clinically quiet, with echo  Pending, acute/chronic CHF with normal LV function likley 2/2 persistent afib with RVR  Will continue diuresis anticpate DCCv in am Indocin x 1 week and then begin to wean Continue colchicine anticpate outpt eval with JA fr [facility]  afib  Await echo,  With elevated neck veins need to make sure no worsening of effusion or constricting physiology

## 2012-02-16 ENCOUNTER — Encounter (HOSPITAL_COMMUNITY): Payer: Self-pay | Admitting: Physician Assistant

## 2012-02-16 ENCOUNTER — Encounter (HOSPITAL_COMMUNITY)
Admission: EM | Disposition: A | Discharge status: Home / Self Care | Payer: Self-pay | Source: Home / Self Care | Attending: Cardiology

## 2012-02-16 DIAGNOSIS — I319 Disease of pericardium, unspecified: Secondary | ICD-10-CM

## 2012-02-16 DIAGNOSIS — R0602 Shortness of breath: Secondary | ICD-10-CM

## 2012-02-16 LAB — BASIC METABOLIC PANEL
BUN: 23 mg/dL (ref 6–23)
CO2: 23 mEq/L (ref 19–32)
Calcium: 8.1 mg/dL — ABNORMAL LOW (ref 8.4–10.5)
GFR calc non Af Amer: 70 mL/min — ABNORMAL LOW (ref >90)
Glucose, Bld: 125 mg/dL — ABNORMAL HIGH (ref 70–99)
Potassium: 3.6 mEq/L (ref 3.5–5.1)
Sodium: 130 mEq/L — ABNORMAL LOW (ref 135–145)

## 2012-02-16 SURGERY — CARDIOVERSION
Anesthesia: Monitor Anesthesia Care | Wound class: Clean

## 2012-02-16 MED ORDER — WARFARIN SODIUM 4 MG PO TABS
4.0000 mg | ORAL_TABLET | Freq: Once | ORAL | Status: AC
  Administered 2012-02-16: 4 mg via ORAL
  Filled 2012-02-16: qty 1

## 2012-02-16 MED ORDER — FUROSEMIDE 10 MG/ML IJ SOLN
40.0000 mg | Freq: Two times a day (BID) | INTRAMUSCULAR | Status: DC
  Administered 2012-02-16 – 2012-02-22 (×13): 40 mg via INTRAVENOUS
  Filled 2012-02-16 (×14): qty 4

## 2012-02-16 NOTE — Progress Notes (Addendum)
ANTICOAGULATION CONSULT NOTE - Follow Up Consult  Pharmacy Consult for coumadin Indication: atrial fibrillation  Allergies  Allergen Reactions  . Other Other (See Comments)    Novocaine.  REACTION:  Rapid heart rate  . Procaine Hcl     Rapid heart rate    Patient Measurements: Height: 5\' 2"  (157.5 cm) Weight: 135 lb 12.8 oz (61.598 kg) IBW/kg (Calculated) : 50.1   Vital Signs: Temp: 98 F (36.7 C) (01/22 1200) Temp src: Oral (01/22 1200) BP: 112/62 mmHg (01/22 1200) Pulse Rate: 113  (01/22 1200)  Labs:  Alvira Philips 02/16/12 0520 02/15/12 0902 02/15/12 0200 02/15/12 0130 02/14/12 2007 02/14/12 1229  HGB -- -- 11.1* -- -- 11.4*  HCT -- -- 31.6* -- -- 33.5*  PLT -- -- 395 -- -- 428*  APTT -- -- -- -- -- 52*  LABPROT 24.1* -- 30.9* -- -- 30.4*  INR 2.28* -- 3.19* -- -- 3.12*  HEPARINUNFRC -- -- -- -- -- --  CREATININE 0.81 -- 0.62 -- -- 0.60  CKTOTAL -- -- -- -- -- --  CKMB -- -- -- -- -- --  TROPONINI -- <0.30 -- <0.30 <0.30 --    Estimated Creatinine Clearance: 52.6 ml/min (by C-G formula based on Cr of 0.81).  Assessment: 77 yo female with PAF on coumadin at home and continuing as inpatient. INR today is now within therapeutic range at  2.28 after holding dose yesterday. No bleeding noted, no immediate plans for pericardial window. Will aggressively dose warfarin.  Goal of Therapy:  INR 2-3 Monitor platelets by anticoagulation protocol: Yes   Plan:  -warfarin 4mg  tonight- watch INR closely on IV amio -Daily PT/INR  Sheppard Coil, Pharm D 02/16/2012 3:05 PM

## 2012-02-16 NOTE — Consult Note (Signed)
Subjective:    Selena Taylor is a 77 y.o. female with known history of PAF/Flutter, Pericarditis, TIA, and Hypertension.  She presented to the ED 2 days ago with a complaint of shortness of breath and Atrial Fibrillation with RVR.  The patient is usually well controlled with Cardizem and coumadin.  The patient was recently admitted with Pericarditis a few weeks ago.  The patient has been experiencing increased LE edema, orthopnea, and chest pain.   Patient was scheduled to undergo cardioversion today.  This was canceled due to enlarging pericardial effusion.  The patient is currently being monitored by Cardiology and we are consulted for possible pericardial window.  Currently the patient complains of feeling poor and experiencing shortness of breath.  She denies chest pain.  She was placed back on a cardiac diet this morning and ate breakfast approximately 1 hour ago.   Review of Systems Pertinent items are noted in HPI.    Objective:   BP 121/64  Pulse 108  Temp 97.9 F (36.6 C) (Oral)  Resp 25  Ht 5\' 2"  (1.575 m)  Wt 135 lb 12.8 oz (61.598 kg)  BMI 24.84 kg/m2  SpO2 96%  General:  alert, cooperative and no distress Skin:  normal Eyes: conjunctivae/corneas clear. PERRL, EOM's intact. Fundi benign. Lungs:  crackles bibasilarlly Heart:  irregularly irregular rhythm Abdomen: soft, non-tender; bowel sounds normal; no masses,  no organomegaly Extremities:  + LE edema Neurologic:  Alert and oriented x3. Gait normal. Reflexes and motor strength normal and symmetric. Cranial nerves 2-12 and sensation grossly intact.    Assessment:  Patient with Pericardial effusion.  Currently stable with no evidence of Tamponade.    Plan:  1. Percardial Effusion- management per cardiology 2.   Of note patient would need to NPO if there was concern for emergent intervention.  Please re-consult if needed

## 2012-02-16 NOTE — Progress Notes (Signed)
Patient Name: Selena Taylor Date of Encounter: 02/16/2012  Active Problems:  Atrial fibrillation/flutter  Pericarditis with effusion  Acute diastolic heart failure    SUBJECTIVE: [name] is a 77 year old female with a history of PAF/flutter, pericarditis, TIA, and HTN who presented with dyspnea and AF with RVR 2 days ago. She is usually controlled with Cardizem and anticoagulated with Coumadin. She was recently admitted for pericarditis 2 weeks ago. Her chest pain had resolved and she was discharged home. Over the past week she has had increasing fatigue, dyspnea on exertion, cough, and weight gain. She had some orthopnea, but does state she is able to lay on her side ok. She noticed significant pedal edema.  Her chest pain had resolved since her last admission, but returned and was 10/10 on admission. It is substernal with no radiation and she reports deep inspiration or exertion exacerbates the pain. She reports sitting up, leaning forward, and standing exacerbate the pain.   Currently, she rates her angina 3/10 and it is just an "awareness" but is not currently feeling any pain. She states her dyspnea has improved. Her fatigue has slightly worsened and her pedal edema hasn't changed. She denies fever, chills, nausea, vomiting, palpitations, dizziness, and feeling lightheaded. She is aware of cardioversion scheduled for today. Her INR was a little high last night, so her coumadin was held.   OBJECTIVE Filed Vitals:   02/15/12 1953 02/16/12 0000 02/16/12 0400 02/16/12 0500  BP: 101/70 90/54 103/59   Pulse:  74 63   Temp: 98 F (36.7 C) 98.4 F (36.9 C) 97.4 F (36.3 C)   TempSrc: Oral Oral Oral   Resp:  26 25   Height:      Weight:    135 lb 12.8 oz (61.598 kg)  SpO2:  91% 98%     Intake/Output Summary (Last 24 hours) at 02/16/12 0821 Last data filed at 02/16/12 0600  Gross per 24 hour  Intake 1719.12 ml  Output   1375 ml  Net 344.12 ml   Filed Weights   02/14/12 1845  02/15/12 0500 02/16/12 0500  Weight: 138 lb 0.1 oz (62.6 kg) 138 lb 3.7 oz (62.7 kg) 135 lb 12.8 oz (61.598 kg)     PHYSICAL EXAM General: Well developed, well nourished, in no acute distress. Head: Normocephalic, atraumatic.  Neck: Supple without bruits, JVD>10 with HJR Lungs:  Resp regular and unlabored, bilateral basilar crackles. No wheezing or rhonchi. Heart: Irregular rate and rhythm, S1, S2 normal. Rub? Abdomen: Soft, non-tender, non-distended, BS + x 4.  Extremities: No clubbing, cyanosis. 1+ pedal edema.  Neuro: Alert and oriented X 3. Moves all extremities spontaneously. Psych: Normal affect.  LABS: CBC:  Basename 02/15/12 0200 02/14/12 1229  WBC 7.2 8.2  NEUTROABS -- 5.9  HGB 11.1* 11.4*  HCT 31.6* 33.5*  MCV 92.4 93.3  PLT 395 428*   INR:  Basename 02/16/12 0520  INR 2.28*   INR  Date/Time Value Range Status  02/16/2012  5:20 AM 2.28* 0.00 - 1.49 Final  02/15/2012  2:00 AM 3.19* 0.00 - 1.49 Final  02/14/2012 12:29 PM 3.12* 0.00 - 1.49 Final  02/06/2012  5:00 AM 2.56* 0.00 - 1.49 Final  02/05/2012  4:02 AM 2.62* 0.00 - 1.49 Final  01/17/2012  1:35 PM 3.0   Final  12/28/2011  1:45 PM 2.6   Final     Basic Metabolic Panel:  Basename 02/16/12 0520 02/15/12 0200 02/14/12 1959  NA 130* 129* --  K 3.6 3.8 --  CL 95* 94* --  CO2 23 21 --  GLUCOSE 125* 102* --  BUN 23 15 --  CREATININE 0.81 0.62 --  CALCIUM 8.1* 8.0* --  MG -- -- 1.8  PHOS -- -- --   Liver Function Tests:  Basename 02/15/12 0200  AST 27  ALT 31  ALKPHOS 135*  BILITOT 0.3  PROT 6.3  ALBUMIN 2.5*   Cardiac Enzymes:  Basename 02/15/12 0902 02/15/12 0130 02/14/12 2007  CKTOTAL -- -- --  CKMB -- -- --  CKMBINDEX -- -- --  TROPONINI <0.30 <0.30 <0.30   BNP: Pro B Natriuretic peptide (BNP)  Date/Time Value Range Status  02/05/2012 10:08 AM 603.1* 0 - 125 pg/mL Final   TELE:       AF, rate 90s  ECG: 15-Feb-2012 06:43:19 [facility]U ROUTINE RECORD Atrial  fibrillation with rapid ventricular response Cannot rule out Anterior infarct , age undetermined Abnormal ECG No significant change since last tracing 28mm/s 37mm/mV 100Hz  8.0.1 12SL 241 HD CID: 1 Referred by: Confirmed By: Nanetta Batty MD Vent. rate 109 BPM PR interval * ms QRS duration 68 ms QT/QTc 324/436 ms P-R-T axes * 51 262  Echo: 02/15/2012 Study Conclusions - Left ventricle: The cavity size was normal. Systolic function was normal. The estimated ejection fraction was in the range of 55% to 60%. Wall motion was normal; there were no regional wall motion abnormalities. - Pericardium, extracardiac: A large pericardial effusion was identified. Impressions: - Large pericardial effusion; IVC is dilated with some respiratory flow variation but no RV or RA collapse. Pericardial effusion is larger compared to Feb 05, 2012.  Radiology/Studies: Dg Chest 2 View 02/14/2012  *RADIOLOGY REPORT*  Clinical Data: Shortness of breath.  Weakness.  CHEST - 2 VIEW  Comparison: 02/05/2012  Findings: Moderate cardiomegaly stable.  There are new small bilateral pleural effusions.  Bibasilar infiltrates or atelectasis are also seen.  No definite evidence of congestive heart failure.  IMPRESSION:  1.  New bibasilar infiltrates versus atelectasis, and small bilateral pleural effusions. 2.  Stable cardiomegaly.   Original Report Authenticated By: Myles Rosenthal, M.D.    Ct Angio Chest Pe W/cm &/or Wo Cm 02/05/2012  *RADIOLOGY REPORT*  Clinical Data: Back pain, concern pulmonary embolism.  Short of breath  CT ANGIOGRAPHY CHEST  Technique:  Multidetector CT imaging of the chest using the standard protocol during bolus administration of intravenous contrast. Multiplanar reconstructed images including MIPs were obtained and reviewed to evaluate the vascular anatomy.  Contrast: OMNIPAQUE IOHEXOL 350 MG/ML SOLN  Comparison: Chest radiograph 02/05/2012 CT abdomen 03/05/2008  Findings: There are no filling  defects within the pulmonary arteries to suggest acute pulmonary embolism.  No acute findings of the aorta great vessels.  There is a small pericardial effusion measuring 10 mm in depth along the lateral wall left ventricle. Heart otherwise appears normal.  Review of the lung parenchyma demonstrates bilateral small pleural effusions.  There is interlobular septal thickening suggesting interstitial edema.  No focal infiltrate.  Limited view of the upper abdomen demonstrates mild fat stranding within the gastrohepatic ligament adjacent to the celiac trunk. Limited view the skeleton demonstrates degenerative change.  IMPRESSION:  1.  No evidence acute pulmonary embolism. 2.  Small pericardial effusion.  Cannot exclude pericarditis. 3.  Small bilateral pleural effusions and interstitial edema. 4.  Mild stranding in the gastrohepatic ligament.  This is a nonspecific finding.  Recommend biochemical correlation with pancreatitis.   Original Report Authenticated By:  Genevive Bi, M.D.    Dg Chest Port 1 View 02/05/2012  *RADIOLOGY REPORT*  Clinical Data: Shortness of breath, chest pain.  PORTABLE CHEST - 1 VIEW  Comparison: 05/03/2011  Findings: Mild cardiomegaly and hyperinflation.  No confluent airspace opacities or effusions.  No acute bony abnormality.  IMPRESSION: Cardiomegaly, hyperinflation.   Original Report Authenticated By: Charlett Nose, M.D.     Current Medications:     . ALPRAZolam  0.25 mg Oral BID  . B-complex with vitamin C  1 tablet Oral Daily  . cholecalciferol  1,000 Units Oral Daily  . colchicine  0.6 mg Oral Daily  . furosemide  40 mg Intravenous Daily  . indomethacin  50 mg Oral BID WC  . omega-3 acid ethyl esters  1 g Oral Daily  . pantoprazole  40 mg Oral Daily  . sodium chloride  3 mL Intravenous Q12H  . Warfarin - Pharmacist Dosing Inpatient   Does not apply q1800  . zolpidem  10 mg Oral QHS      . amiodarone (NEXTERONE PREMIX) 360 mg/200 mL dextrose 30 mg/hr (02/16/12 0335)    . diltiazem (CARDIZEM) infusion 12 mg/hr (02/16/12 0012)    ASSESSMENT AND PLAN: Active Problems: 1. Atrial fibrillation/flutter - Continue rate control with Cardizem and amiodarone and anticoagulation with Coumadin. MD advise on cardioversion with current pericardial effusion. INR dropped to 2.28 overnight.   2. Pericarditis with effusion - being treated with indomethacin, colchicine. Effusion is larger, MD advise on DCCV.   3. Acute diastolic heart failure - Weight gain (still 10 lbs over dry weight) and pedal edema. Continue lasix treatment. Consider increase to BID for better diuresis but BUN/Cr are trending up, MD advise.   4. Hyponatremia - With diuresis, NA slightly better today. Follow.   Barnet Pall , PA-S2 8:21 AM 02/16/2012  Have reviewed with colleagues.  The concern is whether the SOB is CHF 2/2 AF +/- Na retention with indomethacin vs tamponade physiology as suggested by resp variation but no RV/RA collapse  Certainly her AF is contributing to symptoms, but the concern is that the effusion is enlarging and may prompt intervention which if recently cardioverted would but her at increased risk of thromboembolism if we were to need to stop anticoagulation.    Pulsus hard to distinguish with RR variation with afib  Hence we are going to  1-stop indocin 2- continue colchicine, warfarin 3- use amio for rate control 4- stop dilt 5- diurese-increase 6- if BP prevents diuresis consider window 7- alert TCTS about possible need for window 8- hold off on DCCV for now 9- repeat echo on fri

## 2012-02-16 NOTE — Anesthesia Preprocedure Evaluation (Deleted)
Anesthesia Evaluation    Airway       Dental   Pulmonary          Cardiovascular hypertension, Pt. on medications + dysrhythmias Atrial Fibrillation     Neuro/Psych Anxiety TIA   GI/Hepatic   Endo/Other    Renal/GU      Musculoskeletal   Abdominal   Peds  Hematology   Anesthesia Other Findings ECHO from 1/21 showed EF of 55-60%.    Reproductive/Obstetrics                          Anesthesia Physical Anesthesia Plan Anesthesia Quick Evaluation

## 2012-02-17 LAB — BASIC METABOLIC PANEL
BUN: 18 mg/dL (ref 6–23)
Chloride: 94 mEq/L — ABNORMAL LOW (ref 96–112)
GFR calc Af Amer: >90 mL/min (ref >90)
GFR calc non Af Amer: 81 mL/min — ABNORMAL LOW (ref >90)
Potassium: 3.3 mEq/L — ABNORMAL LOW (ref 3.5–5.1)
Sodium: 132 mEq/L — ABNORMAL LOW (ref 135–145)

## 2012-02-17 LAB — PROTIME-INR
INR: 1.85 — ABNORMAL HIGH (ref 0.00–1.49)
Prothrombin Time: 20.7 seconds — ABNORMAL HIGH (ref 11.6–15.2)

## 2012-02-17 MED ORDER — DILTIAZEM HCL 100 MG IV SOLR
5.0000 mg/h | INTRAVENOUS | Status: DC
  Administered 2012-02-17: 15 mg/h via INTRAVENOUS
  Administered 2012-02-17 – 2012-02-18 (×2): 5 mg/h via INTRAVENOUS
  Administered 2012-02-18: 15 mg/h via INTRAVENOUS
  Administered 2012-02-18: 5 mg/h via INTRAVENOUS
  Filled 2012-02-17 (×2): qty 100

## 2012-02-17 MED ORDER — DILTIAZEM HCL 60 MG PO TABS: 60.0000 mg | ORAL_TABLET | Freq: Three times a day (TID) | ORAL | Status: DC

## 2012-02-17 MED ORDER — DILTIAZEM LOAD VIA INFUSION
10.0000 mg | Freq: Once | INTRAVENOUS | Status: AC
  Administered 2012-02-17: 10 mg via INTRAVENOUS
  Filled 2012-02-17: qty 10

## 2012-02-17 NOTE — Progress Notes (Signed)
After discussing risks, benefits, and alternatives. Patient decided to not have a PICC placed. Primary RN [facility] notified. IV team notified to start second PIV. Christeen Douglas RN [facility]

## 2012-02-17 NOTE — Progress Notes (Signed)
Patient: LAURREN LEPKOWSKI Date of Encounter: 02/17/2012, 8:44 AM Admit date: 02/14/2012     Subjective  Ms. Job reports she is feeling "a little better" today. She denies CP. She states her SOB seems to be improving. She denies palpitations or dizziness.    Objective  Physical Exam: Vitals: BP 105/69  Pulse 119  Temp 98.6 F (37 C) (Oral)  Resp 17  Ht 5\' 2"  (1.575 m)  Wt 132 lb 4.4 oz (60 kg)  BMI 24.19 kg/m2  SpO2 93% General: Well developed 77 year old female in no acute distress. Neck: Supple. JVD not elevated. Lungs: Faint rales at right base otherwise clear bilaterally to auscultation without wheezes or rhonchi. Breathing is unlabored. Heart: Irregularly irregular S1 S2 without murmur or gallop. I do not appreciate a rub.  Abdomen: Soft, non-distended. Extremities: No clubbing or cyanosis. No edema.   Neuro: Alert and oriented X 3. Moves all extremities spontaneously. No focal deficits.  Intake/Output:  Intake/Output Summary (Last 24 hours) at 02/17/12 0844 Last data filed at 02/17/12 0700  Gross per 24 hour  Intake 1443.9 ml  Output   2775 ml  Net -1331.1 ml    Inpatient Medications:     . ALPRAZolam  0.25 mg Oral BID  . B-complex with vitamin C  1 tablet Oral Daily  . cholecalciferol  1,000 Units Oral Daily  . colchicine  0.6 mg Oral Daily  . furosemide  40 mg Intravenous BID  . omega-3 acid ethyl esters  1 g Oral Daily  . pantoprazole  40 mg Oral Daily  . sodium chloride  3 mL Intravenous Q12H  . zolpidem  10 mg Oral QHS      . amiodarone (NEXTERONE PREMIX) 360 mg/200 mL dextrose 60 mg/hr (02/17/12 0513)    Labs:  [facility] 02/17/12 0513 02/16/12 0520 02/14/12 1959  NA 132* 130* --  K 3.3* 3.6 --  CL 94* 95* --  CO2 27 23 --  GLUCOSE 115* 125* --  BUN 18 23 --  CREATININE 0.76 0.81 --  CALCIUM 8.3* 8.1* --  MG -- -- 1.8  PHOS -- -- --    Basename 02/15/12 0200  AST 27  ALT 31  ALKPHOS 135*  BILITOT 0.3  PROT 6.3  ALBUMIN 2.5*     Basename 02/15/12 0200 02/14/12 1229  WBC 7.2 8.2  NEUTROABS -- 5.9  HGB 11.1* 11.4*  HCT 31.6* 33.5*  MCV 92.4 93.3  PLT 395 428*    Basename 02/15/12 0902 02/15/12 0130 02/14/12 2007 02/14/12 1229  CKTOTAL -- -- -- --  CKMB -- -- -- --  TROPONINI <0.30 <0.30 <0.30 <0.30    Basename 02/17/12 0513  INR 1.85*    Radiology/Studies: Dg Chest 2 View  02/14/2012  *RADIOLOGY REPORT*  Clinical Data: Shortness of breath.  Weakness.  CHEST - 2 VIEW  Comparison: 02/05/2012  Findings: Moderate cardiomegaly stable.  There are new small bilateral pleural effusions.  Bibasilar infiltrates or atelectasis are also seen.  No definite evidence of congestive heart failure.  IMPRESSION:  1.  New bibasilar infiltrates versus atelectasis, and small bilateral pleural effusions. 2.  Stable cardiomegaly.   Original Report Authenticated By: Myles Rosenthal, M.D.    Ct Angio Chest Pe W/cm &/or Wo Cm  02/05/2012  *RADIOLOGY REPORT*  Clinical Data: Back pain, concern pulmonary embolism.  Short of breath  CT ANGIOGRAPHY CHEST  Technique:  Multidetector CT imaging of the chest using the standard protocol during bolus administration of  intravenous contrast. Multiplanar reconstructed images including MIPs were obtained and reviewed to evaluate the vascular anatomy.  Contrast: OMNIPAQUE IOHEXOL 350 MG/ML SOLN  Comparison: Chest radiograph 02/05/2012 CT abdomen 03/05/2008  Findings: There are no filling defects within the pulmonary arteries to suggest acute pulmonary embolism.  No acute findings of the aorta great vessels.  There is a small pericardial effusion measuring 10 mm in depth along the lateral wall left ventricle. Heart otherwise appears normal.  Review of the lung parenchyma demonstrates bilateral small pleural effusions.  There is interlobular septal thickening suggesting interstitial edema.  No focal infiltrate.  Limited view of the upper abdomen demonstrates mild fat stranding within the gastrohepatic  ligament adjacent to the celiac trunk. Limited view the skeleton demonstrates degenerative change.  IMPRESSION:  1.  No evidence acute pulmonary embolism. 2.  Small pericardial effusion.  Cannot exclude pericarditis. 3.  Small bilateral pleural effusions and interstitial edema. 4.  Mild stranding in the gastrohepatic ligament.  This is a nonspecific finding.  Recommend biochemical correlation with pancreatitis.   Original Report Authenticated By: Genevive Bi, M.D.    Dg Chest Port 1 View  02/05/2012  *RADIOLOGY REPORT*  Clinical Data: Shortness of breath, chest pain.  PORTABLE CHEST - 1 VIEW  Comparison: 05/03/2011  Findings: Mild cardiomegaly and hyperinflation.  No confluent airspace opacities or effusions.  No acute bony abnormality.  IMPRESSION: Cardiomegaly, hyperinflation.   Original Report Authenticated By: Charlett Nose, M.D.     Echocardiogram: 02/15/2012 Study Conclusions  - Left ventricle: The cavity size was normal. Systolic function was normal. The estimated ejection fraction was in the range of 55% to 60%. Wall motion was normal; there were no regional wall motion abnormalities. - Pericardium, extracardiac: A large pericardial effusion was identified. Impressions:  - Large pericardial effusion; IVC is dilated with some respiratory flow variation but no RV or RA collapse. Pericardial effusion is larger compared to Feb 05, 2012.   Telemetry: rapid atrial fibrillation, rate 128 bpm    Assessment and Plan  1. Pericardial effusion - ? etiology; CRP 11.2, sed rate 42 on admission earlier this month; patient reports recent negative w/u for SLE; due to f/u for possible temporal arteritis or PMR  Currently stable. No evidence of tamponade. Continue indocin and colchicine. Stop warfarin. Appreciate CT surgery input. Repeat echo tomorrow.  2. Atrial fibrillation - RVR currently  Patient with longstanding atrial fibrillation and has failed Flecainide in the past. She is intolerant  of multiple beta blockers and intolerant of Rhythmol. She has been maintained on Cardizem for rate control and is anticoagulated with warfarin as an outpatient. Now IV Cardizem has been discontinued due to hypotension (BP now 95-122 systolic; ? need to add back low dose and follow BP) and amiodarone added for rate control. Given her increasing effusion, will stop warfarin today. Appreciate CT surgery input.    3. Acute diastolic HF Continue diuresis with IV Lasix  Signed, EDMISTEN, BROOKE PA-C  Will continue wth IV diuresis;  JVP less evident and SOB better with diuresis;  BP stable in fact a little better speaking to the likely major culprit being CHF2/2 AFRVR vs pericardial fluid Add back dilt today for augmentation of rate control, was better yesterday on the combination.   Will repeat echo in am and see how she is doing, now that INR<2 will have to begin SCDs, and this will complicate options related to DCCV but hopefully help with effusion, although my ignorant impression of the effusion was that it  being clear was not likely hemorrhagic as would have anticipated taht would begin to coagulate and become echogenic.

## 2012-02-18 DIAGNOSIS — I319 Disease of pericardium, unspecified: Secondary | ICD-10-CM

## 2012-02-18 LAB — BASIC METABOLIC PANEL
BUN: 15 mg/dL (ref 6–23)
Calcium: 8.3 mg/dL — ABNORMAL LOW (ref 8.4–10.5)
Calcium: 8.5 mg/dL (ref 8.4–10.5)
GFR calc Af Amer: 73 mL/min — ABNORMAL LOW (ref >90)
GFR calc Af Amer: 73 mL/min — ABNORMAL LOW (ref >90)
GFR calc non Af Amer: 63 mL/min — ABNORMAL LOW (ref >90)
GFR calc non Af Amer: 63 mL/min — ABNORMAL LOW (ref >90)
Glucose, Bld: 123 mg/dL — ABNORMAL HIGH (ref 70–99)
Glucose, Bld: 145 mg/dL — ABNORMAL HIGH (ref 70–99)
Potassium: 3 mEq/L — ABNORMAL LOW (ref 3.5–5.1)
Sodium: 131 mEq/L — ABNORMAL LOW (ref 135–145)
Sodium: 131 mEq/L — ABNORMAL LOW (ref 135–145)

## 2012-02-18 LAB — PROTIME-INR
INR: 1.76 — ABNORMAL HIGH (ref 0.00–1.49)
Prothrombin Time: 19.9 seconds — ABNORMAL HIGH (ref 11.6–15.2)

## 2012-02-18 MED ORDER — POTASSIUM CHLORIDE CRYS ER 20 MEQ PO TBCR
20.0000 meq | EXTENDED_RELEASE_TABLET | ORAL | Status: AC
  Administered 2012-02-18 (×4): 20 meq via ORAL
  Filled 2012-02-18 (×4): qty 1

## 2012-02-18 MED ORDER — DILTIAZEM HCL 60 MG PO TABS
60.0000 mg | ORAL_TABLET | Freq: Four times a day (QID) | ORAL | Status: DC
  Administered 2012-02-18 – 2012-02-21 (×11): 60 mg via ORAL
  Filled 2012-02-18 (×15): qty 1

## 2012-02-18 MED ORDER — DILTIAZEM HCL ER 60 MG PO CP12
60.0000 mg | ORAL_CAPSULE | Freq: Four times a day (QID) | ORAL | Status: DC
  Filled 2012-02-18 (×3): qty 1

## 2012-02-18 NOTE — Progress Notes (Signed)
ANTICOAGULATION CONSULT NOTE - Follow Up Consult  Pharmacy Consult for coumadin(holding) Indication: atrial fibrillation  Allergies  Allergen Reactions  . Other Other (See Comments)    Novocaine.  REACTION:  Rapid heart rate  . Procaine Hcl     Rapid heart rate    Patient Measurements: Height: 5\' 2"  (157.5 cm) Weight: 129 lb 13.6 oz (58.9 kg) IBW/kg (Calculated) : 50.1   Vital Signs: Temp: 97.7 F (36.5 C) (01/24 1223) Temp src: Oral (01/24 1223) BP: 114/63 mmHg (01/24 1223) Pulse Rate: 88  (01/24 1223)  Labs:  Basename 02/18/12 0437 02/17/12 0513 02/16/12 0520  HGB -- -- --  HCT -- -- --  PLT -- -- --  APTT -- -- --  LABPROT 19.9* 20.7* 24.1*  INR 1.76* 1.85* 2.28*  HEPARINUNFRC -- -- --  CREATININE 0.88 0.76 0.81  CKTOTAL -- -- --  CKMB -- -- --  TROPONINI -- -- --    Estimated Creatinine Clearance: 44.4 ml/min (by C-G formula based on Cr of 0.88).  Assessment: 77 yo female with PAF on coumadin at home and continuing as inpatient. INR today is now below therapeutic range at 1.7 after holding doses. No bleeding noted, no immediate plans for pericardial window. Orders to hold warfarin for now, pharmacy to continue to follow peripherally.  Goal of Therapy:  INR 2-3 Monitor platelets by anticoagulation protocol: Yes   Plan:  -Hold warfarin for now -Daily PT/INR  Sheppard Coil, Pharm D 02/18/2012 2:59 PM

## 2012-02-18 NOTE — Progress Notes (Addendum)
Patient: Selena Taylor Date of Encounter: 02/18/2012, 8:08 AM Admit date: 02/14/2012     Subjective  Selena Taylor states she is feeling "much better" this AM. She denies CP, SOB or palpitations. She denies dizziness. She denies orthopnea.   Objective  Physical Exam: Vitals: BP 93/54  Pulse 78  Temp 99 F (37.2 C) (Oral)  Resp 30  Ht 5\' 2"  (1.575 m)  Wt 129 lb 13.6 oz (58.9 kg)  BMI 23.75 kg/m2  SpO2 94% General: Well developed 77 year old female in no acute distress.  Neck: Supple. JVD not elevated.  Lungs:Clear bilaterally to auscultation without wheezes or rhonchi. Breathing is unlabored.  Heart: Irregularly irregular S1 S2 without murmur, rub or gallop. Abdomen: Soft, non-distended.  Extremities: No clubbing or cyanosis. No edema.  Neuro: Alert and oriented X 3. Moves all extremities spontaneously. No focal deficits.  Intake/Output:  Intake/Output Summary (Last 24 hours) at 02/18/12 0808 Last data filed at 02/18/12 0600  Gross per 24 hour  Intake 1613.88 ml  Output   2800 ml  Net -1186.12 ml    Inpatient Medications:     . ALPRAZolam  0.25 mg Oral BID  . B-complex with vitamin C  1 tablet Oral Daily  . cholecalciferol  1,000 Units Oral Daily  . colchicine  0.6 mg Oral Daily  . furosemide  40 mg Intravenous BID  . omega-3 acid ethyl esters  1 g Oral Daily  . pantoprazole  40 mg Oral Daily  . sodium chloride  3 mL Intravenous Q12H  . zolpidem  10 mg Oral QHS      . amiodarone (NEXTERONE PREMIX) 360 mg/200 mL dextrose Stopped (02/18/12 0600)  . diltiazem (CARDIZEM) infusion 5 mg/hr (02/18/12 0600)    Labs:  [facility] 02/18/12 0437 02/17/12 0513  NA 131* 132*  K 3.0* 3.3*  CL 91* 94*  CO2 30 27  GLUCOSE 123* 115*  BUN 17 18  CREATININE 0.88 0.76  CALCIUM 8.3* 8.3*  MG -- --  PHOS -- --    Basename 02/15/12 0902  CKTOTAL --  CKMB --  TROPONINI <0.30    Basename 02/18/12 0437  INR 1.76*    Radiology/Studies: Dg Chest 2 View  02/14/2012   *RADIOLOGY REPORT*  Clinical Data: Shortness of breath.  Weakness.  CHEST - 2 VIEW  Comparison: 02/05/2012  Findings: Moderate cardiomegaly stable.  There are new small bilateral pleural effusions.  Bibasilar infiltrates or atelectasis are also seen.  No definite evidence of congestive heart failure.  IMPRESSION:  1.  New bibasilar infiltrates versus atelectasis, and small bilateral pleural effusions. 2.  Stable cardiomegaly.   Original Report Authenticated By: Myles Rosenthal, M.D.    Ct Angio Chest Pe W/cm &/or Wo Cm  02/05/2012  *RADIOLOGY REPORT*  Clinical Data: Back pain, concern pulmonary embolism.  Short of breath  CT ANGIOGRAPHY CHEST  Technique:  Multidetector CT imaging of the chest using the standard protocol during bolus administration of intravenous contrast. Multiplanar reconstructed images including MIPs were obtained and reviewed to evaluate the vascular anatomy.  Contrast: OMNIPAQUE IOHEXOL 350 MG/ML SOLN  Comparison: Chest radiograph 02/05/2012 CT abdomen 03/05/2008  Findings: There are no filling defects within the pulmonary arteries to suggest acute pulmonary embolism.  No acute findings of the aorta great vessels.  There is a small pericardial effusion measuring 10 mm in depth along the lateral wall left ventricle. Heart otherwise appears normal.  Review of the lung parenchyma demonstrates bilateral small pleural effusions.  There is interlobular septal thickening suggesting interstitial edema.  No focal infiltrate.  Limited view of the upper abdomen demonstrates mild fat stranding within the gastrohepatic ligament adjacent to the celiac trunk. Limited view the skeleton demonstrates degenerative change.  IMPRESSION:  1.  No evidence acute pulmonary embolism. 2.  Small pericardial effusion.  Cannot exclude pericarditis. 3.  Small bilateral pleural effusions and interstitial edema. 4.  Mild stranding in the gastrohepatic ligament.  This is a nonspecific finding.  Recommend biochemical  correlation with pancreatitis.   Original Report Authenticated By: Genevive Bi, M.D.    Dg Chest Port 1 View  02/05/2012  *RADIOLOGY REPORT*  Clinical Data: Shortness of breath, chest pain.  PORTABLE CHEST - 1 VIEW  Comparison: 05/03/2011  Findings: Mild cardiomegaly and hyperinflation.  No confluent airspace opacities or effusions.  No acute bony abnormality.  IMPRESSION: Cardiomegaly, hyperinflation.   Original Report Authenticated By: Charlett Nose, M.D.    Echocardiogram: repeat echo pending this AM Telemetry: atrial fibrillation; better rate control over the last 24 hours after IV diltiazem restarted    Assessment and Plan  1. Pericardial effusion - ? etiology; CRP 11.2, sed rate 42 on admission earlier this month; patient reports recent negative w/u for SLE; due to f/u for possible temporal arteritis or PMR   Currently stable. No evidence of tamponade by echo 02/15/2012. Continue colchicine. Stop warfarin. Appreciate CT surgery input. Repeat echo today.   2. Atrial fibrillation - improved rate control; ? DCCV timing  Patient with longstanding atrial fibrillation and has failed Flecainide in the past. She is intolerant of multiple beta blockers and intolerant of Rhythmol.   3. Acute diastolic HF  Continue diuresis with Lasix; ? transition to PO today or tomorrow  Dr. Graciela Husbands to see Signed, EDMISTEN, BROOKE PA-C  Pt unfortunately starved all day long.  My mistake.  Echo is stable per dr TB with persistent fluid but no R sided compressions and no significant mitral valve inflow variation  K repleted this am; recheck tonight.  Will transition to po rate control dilt today and amiodarone tomorrow  and diuretics IV>>PO in am also  Repeat echo on Mon and hope to discharge early next week   I have reviewed wtugh pt and son the following Effusion required stopping the anticoagulation as it was enlarging Atrial fibrillation would require anticoagulation to DCCV and that is currently  on hold because of the above There is still some possibility that the effusion will need to be drained for therapeutics and or diagnosis Her dyspnea is much better with rate control and diuresis suggesting that tamponade physiology, absent on echo, is also not clinically operative and that her symptoms were likely related to CHF 2/2 AF and RVR Will continue colchicine for effusion Plan would be to resume anticoagulation once the effusion begins to resolve or is tapped There is risk of CVA without the anticoagulants, but the risk of hemorrhagic transformation of the effusion precludes there current use.

## 2012-02-18 NOTE — Progress Notes (Signed)
Patient would not eat her meals because she was waiting just in case MD would do his procedures today. I explained to her that I did not see any reason today for her not to eat her meals but she still wanted to wait and family made aware that is was their mothers decision not to eat

## 2012-02-18 NOTE — Progress Notes (Signed)
Brooke PA made aware of patient not eating and informed me to let patient know no procedures would be done today and that she could eat and daughter also aware

## 2012-02-18 NOTE — Progress Notes (Signed)
2D Echocardiogram has been performed.  Selena Taylor A 02/18/2012, 8:57 AM

## 2012-02-19 LAB — BASIC METABOLIC PANEL
CO2: 29 mEq/L (ref 19–32)
Calcium: 8.3 mg/dL — ABNORMAL LOW (ref 8.4–10.5)
Creatinine, Ser: 0.8 mg/dL (ref 0.50–1.10)
GFR calc non Af Amer: 71 mL/min — ABNORMAL LOW (ref >90)
Sodium: 133 mEq/L — ABNORMAL LOW (ref 135–145)

## 2012-02-19 LAB — PROTIME-INR: INR: 1.52 — ABNORMAL HIGH (ref 0.00–1.49)

## 2012-02-19 MED ORDER — AMIODARONE HCL 200 MG PO TABS
400.0000 mg | ORAL_TABLET | Freq: Three times a day (TID) | ORAL | Status: DC
  Administered 2012-02-19 – 2012-02-22 (×10): 400 mg via ORAL
  Filled 2012-02-19 (×12): qty 2

## 2012-02-19 MED ORDER — POTASSIUM CHLORIDE CRYS ER 20 MEQ PO TBCR
20.0000 meq | EXTENDED_RELEASE_TABLET | Freq: Once | ORAL | Status: AC
  Administered 2012-02-19 – 2012-02-20 (×2): 20 meq via ORAL
  Filled 2012-02-19: qty 1

## 2012-02-19 NOTE — Progress Notes (Signed)
   SUBJECTIVE:  She had some SOB this am on waking but is feeling better now.  No chest pain   PHYSICAL EXAM Filed Vitals:   02/18/12 2311 02/19/12 0530 02/19/12 0755 02/19/12 0812  BP:  117/68 113/70   Pulse:  100 89   Temp: 99.6 F (37.6 C) 98.4 F (36.9 C)  98.9 F (37.2 C)  TempSrc: Oral Oral  Oral  Resp:  22 26   Height:  5\' 2"  (1.575 m)    Weight:  131 lb 13.4 oz (59.8 kg)    SpO2: 95% 92% 95%    General:  No distress Lungs:  Clear Heart:  Irregular, no rub Abdomen:  Positive bowel sounds, no rebound no guarding Extremities:  Trace edema Neuro: Nonfocal HEENT:  PERRL  LABS: Lab Results  Component Value Date   CKTOTAL 69 05/02/2011   CKMB 2.3 05/02/2011   TROPONINI <0.30 02/15/2012   Results for orders placed during the hospital encounter of 02/14/12 (from the past 24 hour(s))  BASIC METABOLIC PANEL     Status: Abnormal   Collection Time   02/18/12  6:48 PM      Component Value Range   Sodium 131 (*) 135 - 145 mEq/L   Potassium 3.8  3.5 - 5.1 mEq/L   Chloride 91 (*) 96 - 112 mEq/L   CO2 29  19 - 32 mEq/L   Glucose, Bld 145 (*) 70 - 99 mg/dL   BUN 15  6 - 23 mg/dL   Creatinine, Ser 4.54  0.50 - 1.10 mg/dL   Calcium 8.5  8.4 - 09.8 mg/dL   GFR calc non Af Amer 63 (*) >90 mL/min   GFR calc Af Amer 73 (*) >90 mL/min  PROTIME-INR     Status: Abnormal   Collection Time   02/19/12  5:30 AM      Component Value Range   Prothrombin Time 17.9 (*) 11.6 - 15.2 seconds   INR 1.52 (*) 0.00 - 1.49  BASIC METABOLIC PANEL     Status: Abnormal   Collection Time   02/19/12  5:30 AM      Component Value Range   Sodium 133 (*) 135 - 145 mEq/L   Potassium 3.3 (*) 3.5 - 5.1 mEq/L   Chloride 93 (*) 96 - 112 mEq/L   CO2 29  19 - 32 mEq/L   Glucose, Bld 138 (*) 70 - 99 mg/dL   BUN 16  6 - 23 mg/dL   Creatinine, Ser 1.19  0.50 - 1.10 mg/dL   Calcium 8.3 (*) 8.4 - 10.5 mg/dL   GFR calc non Af Amer 71 (*) >90 mL/min   GFR calc Af Amer 82 (*) >90 mL/min    Intake/Output Summary  (Last 24 hours) at 02/19/12 0905 Last data filed at 02/19/12 0814  Gross per 24 hour  Intake 1796.23 ml  Output   2100 ml  Net -303.77 ml    ASSESSMENT AND PLAN:  1. Pericardial effusion -  No evidence of tamponade on previous echo or clinically.  Plan repeat echo on Monday and further consideration of watchful waiting vs pericardiocentesis. On colchicine.   2. Atrial fibrillation - Rate controlled with amiodarone.  Not on anticoag with pericardial effusion.  Change to PO amio today.    3. Acute diastolic HF -  Continue IV diuresis probably over the weekend.   4. Hypokalemia - Supplement    Rollene Rotunda 02/19/2012 9:05 AM

## 2012-02-19 NOTE — Progress Notes (Signed)
ANTICOAGULATION CONSULT NOTE - Follow Up Consult  Pharmacy Consult for coumadin(holding) Indication: atrial fibrillation  Allergies  Allergen Reactions  . Other Other (See Comments)    Novocaine.  REACTION:  Rapid heart rate  . Procaine Hcl     Rapid heart rate    Patient Measurements: Height: 5\' 2"  (157.5 cm) Weight: 131 lb 13.4 oz (59.8 kg) IBW/kg (Calculated) : 50.1   Vital Signs: Temp: 97.8 F (36.6 C) (01/25 1145) Temp src: Oral (01/25 1145) BP: 110/30 mmHg (01/25 1145) Pulse Rate: 89  (01/25 0755)  Labs:  Basename 02/19/12 0530 02/18/12 1848 02/18/12 0437 02/17/12 0513  HGB -- -- -- --  HCT -- -- -- --  PLT -- -- -- --  APTT -- -- -- --  LABPROT 17.9* -- 19.9* 20.7*  INR 1.52* -- 1.76* 1.85*  HEPARINUNFRC -- -- -- --  CREATININE 0.80 0.88 0.88 --  CKTOTAL -- -- -- --  CKMB -- -- -- --  TROPONINI -- -- -- --    Estimated Creatinine Clearance: 48.8 ml/min (by C-G formula based on Cr of 0.8).  Assessment: 77 yo female with PAF on coumadin at home and continuing as inpatient. INR today is now below therapeutic range at 1.5 after holding doses. No bleeding noted, no immediate plans for pericardial window, MD to recheck ECHO on Mon 1/27 to assess need for pericardiocentesis. Orders to hold warfarin for now, pharmacy to continue to follow peripherally.  Goal of Therapy:  INR 2-3 Monitor platelets by anticoagulation protocol: Yes   Plan:  -Hold warfarin for now -Daily PT/INR  Thank you for the consult.  Tomi Bamberger, PharmD Clinical Pharmacist Pager: [phone] Pharmacy: [phone] 02/19/2012 2:23 PM

## 2012-02-20 LAB — BASIC METABOLIC PANEL
BUN: 19 mg/dL (ref 6–23)
CO2: 28 mEq/L (ref 19–32)
Calcium: 8.3 mg/dL — ABNORMAL LOW (ref 8.4–10.5)
Calcium: 8.8 mg/dL (ref 8.4–10.5)
Creatinine, Ser: 0.77 mg/dL (ref 0.50–1.10)
Creatinine, Ser: 0.74 mg/dL (ref 0.50–1.10)
GFR calc non Af Amer: 81 mL/min — ABNORMAL LOW (ref >90)
GFR calc non Af Amer: 82 mL/min — ABNORMAL LOW (ref >90)
Glucose, Bld: 119 mg/dL — ABNORMAL HIGH (ref 70–99)
Glucose, Bld: 109 mg/dL — ABNORMAL HIGH (ref 70–99)

## 2012-02-20 LAB — PROTIME-INR: INR: 1.31 (ref 0.00–1.49)

## 2012-02-20 MED ORDER — GUAIFENESIN-DM 100-10 MG/5ML PO SYRP
10.0000 mL | ORAL_SOLUTION | ORAL | Status: DC | PRN
  Administered 2012-02-20 – 2012-02-21 (×3): 10 mL via ORAL
  Administered 2012-02-21: 5 mL via ORAL
  Administered 2012-02-21: 10 mL via ORAL
  Administered 2012-02-22 (×2): 5 mL via ORAL
  Administered 2012-02-22: 10 mL via ORAL
  Filled 2012-02-20 (×2): qty 10
  Filled 2012-02-20 (×2): qty 5
  Filled 2012-02-20 (×7): qty 10

## 2012-02-20 MED ORDER — POTASSIUM CHLORIDE CRYS ER 20 MEQ PO TBCR
40.0000 meq | EXTENDED_RELEASE_TABLET | Freq: Two times a day (BID) | ORAL | Status: AC
  Administered 2012-02-20 (×2): 40 meq via ORAL
  Filled 2012-02-20: qty 2

## 2012-02-20 MED ORDER — POTASSIUM CHLORIDE CRYS ER 20 MEQ PO TBCR
20.0000 meq | EXTENDED_RELEASE_TABLET | Freq: Two times a day (BID) | ORAL | Status: DC
  Administered 2012-02-20: 20 meq via ORAL
  Filled 2012-02-20 (×3): qty 1
  Filled 2012-02-20: qty 2

## 2012-02-20 MED ORDER — POTASSIUM CHLORIDE CRYS ER 20 MEQ PO TBCR
EXTENDED_RELEASE_TABLET | ORAL | Status: AC
  Administered 2012-02-20: 20 meq via ORAL
  Filled 2012-02-20: qty 2

## 2012-02-20 NOTE — Progress Notes (Signed)
    SUBJECTIVE:  She had some SOB this am on waking but is feeling better now.  No chest pain   PHYSICAL EXAM Filed Vitals:   02/20/12 0500 02/20/12 0613 02/20/12 0755 02/20/12 0815  BP:  106/70 113/70   Pulse:   97   Temp:    98.3 F (36.8 C)  TempSrc:    Oral  Resp:      Height:      Weight: 123 lb 7.3 oz (56 kg)     SpO2:       General:  No distress Lungs:  Clear Heart:  Irregular, no rub Abdomen:  Positive bowel sounds, no rebound no guarding Extremities:  Trace edema   LABS: Lab Results  Component Value Date   CKTOTAL 69 05/02/2011   CKMB 2.3 05/02/2011   TROPONINI <0.30 02/15/2012   Results for orders placed during the hospital encounter of 02/14/12 (from the past 24 hour(s))  PROTIME-INR     Status: Abnormal   Collection Time   02/20/12  5:10 AM      Component Value Range   Prothrombin Time 16.0 (*) 11.6 - 15.2 seconds   INR 1.31  0.00 - 1.49  BASIC METABOLIC PANEL     Status: Abnormal   Collection Time   02/20/12  5:10 AM      Component Value Range   Sodium 129 (*) 135 - 145 mEq/L   Potassium 3.0 (*) 3.5 - 5.1 mEq/L   Chloride 89 (*) 96 - 112 mEq/L   CO2 28  19 - 32 mEq/L   Glucose, Bld 119 (*) 70 - 99 mg/dL   BUN 19  6 - 23 mg/dL   Creatinine, Ser 1.32  0.50 - 1.10 mg/dL   Calcium 8.3 (*) 8.4 - 10.5 mg/dL   GFR calc non Af Amer 81 (*) >90 mL/min   GFR calc Af Amer >90  >90 mL/min    Intake/Output Summary (Last 24 hours) at 02/20/12 0958 Last data filed at 02/20/12 0600  Gross per 24 hour  Intake  553.6 ml  Output   2501 ml  Net -1947.4 ml    ASSESSMENT AND PLAN:  1. Pericardial effusion -  No evidence of tamponade on previous echo or clinically.  Plan repeat echo on Monday and further consideration of watchful waiting vs pericardiocentesis. On colchicine.   2. Atrial fibrillation - Rate controlled with amiodarone.  Not on anticoag currently with pericardial effusion.  Changed to PO amio yesterday. Continue Cardizem.    3. Acute diastolic HF - Na  is running low.  Restrict PO fluid.  Follow.  I will continue the IV diuresis again today.   4. Hypokalemia - I will increase KDur today.      Rollene Rotunda 02/20/2012 9:58 AM

## 2012-02-21 DIAGNOSIS — I319 Disease of pericardium, unspecified: Secondary | ICD-10-CM

## 2012-02-21 MED ORDER — GUAIFENESIN-DM 100-10 MG/5ML PO SYRP
10.0000 mL | ORAL_SOLUTION | Freq: Once | ORAL | Status: AC
  Administered 2012-02-21: 10 mL via ORAL
  Filled 2012-02-21: qty 10

## 2012-02-21 MED ORDER — DILTIAZEM HCL ER COATED BEADS 240 MG PO CP24
240.0000 mg | ORAL_CAPSULE | Freq: Every day | ORAL | Status: DC
  Administered 2012-02-21 – 2012-02-22 (×2): 240 mg via ORAL
  Filled 2012-02-21 (×2): qty 1

## 2012-02-21 MED ORDER — POTASSIUM CHLORIDE CRYS ER 20 MEQ PO TBCR
40.0000 meq | EXTENDED_RELEASE_TABLET | Freq: Three times a day (TID) | ORAL | Status: AC
  Administered 2012-02-21 (×3): 40 meq via ORAL
  Filled 2012-02-21 (×2): qty 2

## 2012-02-21 MED ORDER — SALINE SPRAY 0.65 % NA SOLN
1.0000 | NASAL | Status: DC | PRN
  Administered 2012-02-21: 1 via NASAL
  Filled 2012-02-21: qty 44

## 2012-02-21 MED ORDER — POTASSIUM CHLORIDE CRYS ER 20 MEQ PO TBCR: 40.0000 meq | EXTENDED_RELEASE_TABLET | Freq: Three times a day (TID) | ORAL | Status: DC

## 2012-02-21 NOTE — Progress Notes (Signed)
  Echocardiogram 2D Echocardiogram has been performed.  Ellender Hose A 02/21/2012, 12:02 PM

## 2012-02-21 NOTE — Progress Notes (Signed)
Utilization review completed.  

## 2012-02-21 NOTE — Progress Notes (Signed)
Patient: Selena Taylor Date of Encounter: 02/21/2012, 8:30 AM Admit date: 02/14/2012     Subjective  Ms. Gauer reports she is "drained" this AM. She feels fatigued but denies CP, SOB or palpitations. Of note, she has refused labs this AM.   Objective  Physical Exam: Vitals: BP 124/82  Pulse 83  Temp 97.9 F (36.6 C) (Oral)  Resp 18  Ht 5\' 2"  (1.575 m)  Wt 123 lb 7.3 oz (56 kg)  BMI 22.58 kg/m2  SpO2 92% General: Well developed 77 year old female in no acute distress. Neck: Supple. JVD not elevated. Lungs: Clear bilaterally to auscultation without wheezes, rales, or rhonchi. Breathing is unlabored. Heart: Irregularly irregular S1 S2 without murmur, rub or gallop.  Abdomen: Soft, non-distended. Extremities: No clubbing or cyanosis. No edema.  Distal pedal pulses are 2+ and equal bilaterally. Neuro: Alert and oriented X 3. Moves all extremities spontaneously. No focal deficits.  Intake/Output:  Intake/Output Summary (Last 24 hours) at 02/21/12 0830 Last data filed at 02/21/12 0400  Gross per 24 hour  Intake    964 ml  Output    850 ml  Net    114 ml    Inpatient Medications:     . ALPRAZolam  0.25 mg Oral BID  . amiodarone  400 mg Oral TID  . B-complex with vitamin C  1 tablet Oral Daily  . cholecalciferol  1,000 Units Oral Daily  . colchicine  0.6 mg Oral Daily  . diltiazem  60 mg Oral Q6H  . furosemide  40 mg Intravenous BID  . omega-3 acid ethyl esters  1 g Oral Daily  . pantoprazole  40 mg Oral Daily  . potassium chloride  20 mEq Oral BID  . sodium chloride  3 mL Intravenous Q12H  . zolpidem  10 mg Oral QHS    Labs:  [facility] 02/20/12 1209 02/20/12 0510  NA 131* 129*  K 3.3* 3.0*  CL 88* 89*  CO2 30 28  GLUCOSE 109* 119*  BUN 19 19  CREATININE 0.74 0.77  CALCIUM 8.8 8.3*  MG -- --  PHOS -- --    Basename 02/20/12 0510  INR 1.31    Radiology/Studies: Dg Chest 2 View  02/14/2012  *RADIOLOGY REPORT*  Clinical Data: Shortness of breath.   Weakness.  CHEST - 2 VIEW  Comparison: 02/05/2012  Findings: Moderate cardiomegaly stable.  There are new small bilateral pleural effusions.  Bibasilar infiltrates or atelectasis are also seen.  No definite evidence of congestive heart failure.  IMPRESSION:  1.  New bibasilar infiltrates versus atelectasis, and small bilateral pleural effusions. 2.  Stable cardiomegaly.   Original Report Authenticated By: Myles Rosenthal, M.D.     Echocardiogram: pending this AM Telemetry: atrial fibrillation, overall rate controlled    Assessment and Plan  1. Pericardial effusion - ? etiology; CRP 11.2, sed rate 42 on admission earlier this month; patient reports recent negative w/u for SLE; due to f/u for possible temporal arteritis or PMR  Currently stable. No evidence of tamponade by echo 02/18/2012. Continue colchicine. Warfarin d/c'd. Appreciate CT surgery input. Repeat echo today.  2. Atrial fibrillation - overall rate controlled; switch short acting diltiazem to long acting 240 mg once daily; continue amiodarone Patient with longstanding atrial fibrillation and has failed Flecainide in the past. She is intolerant of multiple beta blockers and intolerant of Rhythmol.  3. Acute diastolic HF  Continue diuresis with Lasix; ? transition to PO today 4. Hypokalemia 3.3 yesterday;  will give K-dur 40 mEq PO three times today then repeat BMET in AM; of note, patient refusing labs this AM  Dr. Graciela Husbands to see Signed, EDMISTEN, BROOKE PA-C  Her breathing is better. She is a undulating. She remains somewhat hypokalemic. Repeat echo is pending at this time.  We'll plan to reassess her fusion by echo. She has a positive Kussmaul sign   If the effusion is not hemodynamically more compromising, we'll anticipate discharge tomorrow with followup next week with a repeat echo and continuing her off anticoagulation. I have reviewed this plan (again) with her and her son. They're unfortunately seems to be ongoing confusion

## 2012-02-22 ENCOUNTER — Other Ambulatory Visit: Payer: Self-pay | Admitting: Physician Assistant

## 2012-02-22 DIAGNOSIS — E876 Hypokalemia: Secondary | ICD-10-CM | POA: Diagnosis not present

## 2012-02-22 DIAGNOSIS — Z7901 Long term (current) use of anticoagulants: Secondary | ICD-10-CM

## 2012-02-22 DIAGNOSIS — E871 Hypo-osmolality and hyponatremia: Secondary | ICD-10-CM | POA: Diagnosis present

## 2012-02-22 DIAGNOSIS — I313 Pericardial effusion (noninflammatory): Secondary | ICD-10-CM

## 2012-02-22 LAB — BASIC METABOLIC PANEL
BUN: 20 mg/dL (ref 6–23)
Calcium: 8.5 mg/dL (ref 8.4–10.5)
GFR calc non Af Amer: 66 mL/min — ABNORMAL LOW (ref >90)
Glucose, Bld: 119 mg/dL — ABNORMAL HIGH (ref 70–99)
Sodium: 130 mEq/L — ABNORMAL LOW (ref 135–145)

## 2012-02-22 MED ORDER — DILTIAZEM HCL ER 240 MG PO CP24: 240.0000 mg | ORAL_CAPSULE | Freq: Every day | ORAL | Status: DC

## 2012-02-22 MED ORDER — FUROSEMIDE 40 MG PO TABS: 40.0000 mg | ORAL_TABLET | Freq: Two times a day (BID) | ORAL | Status: DC

## 2012-02-22 MED ORDER — AMIODARONE HCL 200 MG PO TABS: 400.0000 mg | ORAL_TABLET | Freq: Two times a day (BID) | ORAL | Status: DC

## 2012-02-22 NOTE — Evaluation (Signed)
Physical Therapy Evaluation Patient Details Name: Selena Taylor MRN: [id number] DOB: 12/01/1935 Today's Date: 02/22/2012 Time: 7829-5621 PT Time Calculation (min): 17 min  PT Assessment / Plan / Recommendation Clinical Impression  Pt adm with heart failure after recent onset of a-fib.  Pt will have 24 hour assist at home from grandson if needed.  Instructed pt to incr amb at home after dc.  Also instructed pt in balance activities including tandem stance and one-legged stance with support of counter.Marland Kitchen    PT Assessment  Patent does not need any further PT services    Follow Up Recommendations  No PT follow up    Does the patient have the potential to tolerate intense rehabilitation      Barriers to Discharge        Equipment Recommendations  None recommended by PT    Recommendations for Other Services     Frequency      Precautions / Restrictions     Pertinent Vitals/Pain See vitals.      Mobility  Bed Mobility Bed Mobility: Supine to Sit;Sitting - Scoot to Edge of Bed;Sit to Supine Supine to Sit: 7: Independent;HOB flat Sitting - Scoot to Edge of Bed: 7: Independent Sit to Supine: 7: Independent;HOB flat Transfers Transfers: Sit to Stand;Stand to Sit Sit to Stand: 7: Independent;With upper extremity assist;From bed Stand to Sit: 7: Independent;With upper extremity assist;To bed Ambulation/Gait Ambulation/Gait Assistance: 6: Modified independent (Device/Increase time) Ambulation Distance (Feet): 150 Feet Assistive device: None Ambulation/Gait Assistance Details: occasionally reaching for wall to steady herself Gait Pattern: Within Functional Limits    Shoulder Instructions     Exercises     PT Diagnosis:    PT Problem List:   PT Treatment Interventions:     PT Goals    Visit Information  Last PT Received On: 02/22/12 Assistance Needed: +1    Subjective Data  Subjective: Pt stated she will have cardioversion in about 10 days. Patient Stated Goal:  Return home   Prior Functioning  Home Living Lives With: Alone Available Help at Discharge: Family;Available 24 hours/day Type of Home: House Home Access: Level entry Home Layout: One level Home Adaptive Equipment: None Prior Function Able to Take Stairs?: Yes Driving: Yes Vocation: Retired Musician: No difficulties    Cognition  Overall Cognitive Status: Appears within functional limits for tasks assessed/performed Arousal/Alertness: Awake/alert Orientation Level: Appears intact for tasks assessed Behavior During Session: [facility] for tasks performed    Extremity/Trunk Assessment Right Lower Extremity Assessment RLE ROM/Strength/Tone: [facility] for tasks assessed Left Lower Extremity Assessment LLE ROM/Strength/Tone: [facility] for tasks assessed   Balance Static Standing Balance Static Standing - Balance Support: During functional activity Static Standing - Level of Assistance: 7: Independent  End of Session PT - End of Session Activity Tolerance: Patient tolerated treatment well Patient left: in bed;with call bell/phone within reach;with family/visitor present Nurse Communication: Mobility status  GP     [facility] 02/22/2012, 2:38 PM  [facility] PT [phone]

## 2012-02-22 NOTE — Discharge Summary (Signed)
CARDIOLOGY DISCHARGE SUMMARY   Patient ID: Selena Taylor MRN: [id number] DOB/AGE: 1936-01-16 77 y.o.  Admit date: 02/14/2012 Discharge date: 02/22/2012  Primary Discharge Diagnosis:   *Acute diastolic heart failure   Secondary Discharge Diagnosis:   Atrial fibrillation with RVR  Pericarditis with effusion  Chronic anticoagulation  Hyponatremia  Hypokalemia  Consults: TCTS, physical therapy  Procedures: 2-D echocardiogram, two-view chest x-ray  Hospital Course: Selena Taylor is a 77 year old female with a history of atrial fib/flutter and recent admission for pericarditis. She was discharged on 02/06/2012 after being placed on colchicine and indomethacin. Because of her atrial fibrillation, she was also on Coumadin. She had increasing shortness of breath plus orthopnea and pedal edema. She came to the hospital and was found to be in acute heart failure with rapid atrial fibrillation. She was admitted for further evaluation and treatment.  #1 acute diastolic CHF: Selena Taylor at not previously been on diuretics. She was started on IV Lasix and her weight decreased by 15 pounds over the course of her hospital stay. As she diuresed, her shortness of breath improved. By discharge she was not requiring supplemental oxygen. She is to continue on Lasix at home.  #2 atrial fibrillation with rapid ventricular response: She had been intolerant to Rythmol and flecainide. The situation was discussed with Dr. Graciela Husbands and he recommended amiodarone. She was loaded with amiodarone and is to be on 400 mg twice a day for 2 weeks then 400 mg daily at discharge. She was also on diltiazem for rate control. Initially the diltiazem was IV and then changed to oral medication. Initially, the plan was for direct current cardioversion. However, she had an echocardiogram checked and her pericardial effusion was enlarging. The cardioversion was canceled.  #3 chronic anticoagulation: Her INR was therapeutic on admission at 3.12.  With the enlarging pericardial effusion, her Coumadin was held. A decision on restarting it will be made as an outpatient.  #4 pericardial effusion: Her echo was checked 1/21 and the pericardial effusion was enlarging. She was not in tamponade. A TCTS consult was called. She was seen by Dr. Tyrone Sage but said she was not in tamponade and he did not recommend pericardiocentesis at this time. She is to continue on colchicine but not indomethacin. Her echo was rechecked 1/27 and the pericardial effusion had decreased in size. She is to be rechecked next week.  #5 hyponatremia - she was hyponatremic on admission and this was followed closely during her hospital stay. At discharge her sodium level is 130 and will be rechecked next week.  #6 hypokalemia: With diuresis, she be came hypo-kalemic. Her potassium was supplemented and this will also be followed closely as an outpatient.  02/22/2012, or shortness of breath had improved. Her heart rate control had improved as well. She was evaluated by Dr. Graciela Husbands and by physical therapy. She was considered stable for discharge, in improved condition, to be followed closely as an outpatient.  Labs:  Lab Results  Component Value Date   WBC 7.2 02/15/2012   HGB 11.1* 02/15/2012   HCT 31.6* 02/15/2012   MCV 92.4 02/15/2012   PLT 395 02/15/2012     Lab 02/22/12 0456  NA 130*  K 4.2  CL 94*  CO2 25  BUN 20  CREATININE 0.85  CALCIUM 8.5  PROT --  BILITOT --  ALKPHOS --  ALT --  AST --  GLUCOSE 119*    Pro B Natriuretic peptide (BNP)  Date/Time Value Range Status  02/05/2012 10:08 AM  603.1* 0 - 125 pg/mL Final    Basename 02/22/12 0456  INR 1.27      Radiology: Dg Chest 2 View 02/14/2012  *RADIOLOGY REPORT*  Clinical Data: Shortness of breath.  Weakness.  CHEST - 2 VIEW  Comparison: 02/05/2012  Findings: Moderate cardiomegaly stable.  There are new small bilateral pleural effusions.  Bibasilar infiltrates or atelectasis are also seen.  No definite  evidence of congestive heart failure.  IMPRESSION:  1.  New bibasilar infiltrates versus atelectasis, and small bilateral pleural effusions. 2.  Stable cardiomegaly.   Original Report Authenticated By: Myles Rosenthal, M.D.    EKG:  16-Feb-2012 07:00:01 [facility] ROUTINE RECORD Atrial flutter with 2:1 A-V conduction ST & T wave abnormality, consider inferior ischemia Abnormal ECG Compared prior EKG, atrial flutter has replaced afib 59mm/s 81mm/mV 100Hz  8.0.1 12SL 241 HD CID: 1 Referred by: Confirmed By: [place] HILTY, MD Vent. rate 124 BPM PR interval * ms QRS duration 74 ms QT/QTc 258/370 ms P-R-T axes * 45 266  Echo:  02/21/2012 Study Conclusions - Left ventricle: The cavity size was normal. Wall thickness was normal. Systolic function was normal. The estimated ejection fraction was in the range of 55% to 60%. - Pericardium, extracardiac: A small to moderate, free-flowing pericardial effusion was identified circumferential to the heart. There was a left pleural effusion.  FOLLOW UP PLANS AND APPOINTMENTS Allergies  Allergen Reactions  . Other Other (See Comments)    Novocaine.  REACTION:  Rapid heart rate  . Procaine Hcl     Rapid heart rate     Medication List     As of 02/22/2012  3:04 PM    STOP taking these medications         indomethacin 50 MG capsule   Commonly known as: INDOCIN      ramipril 10 MG capsule   Commonly known as: ALTACE      warfarin 5 MG tablet   Commonly known as: COUMADIN      TAKE these medications         acetaminophen 325 MG tablet   Commonly known as: TYLENOL   Take 650 mg by mouth every 4 (four) hours as needed. For pain and inflammation      amiodarone 200 MG tablet   Commonly known as: PACERONE   Take 2 tablets (400 mg total) by mouth 2 (two) times daily. Take 2 tabs 2 x day for 2 weeks, then 2 tabs daily.      B-complex with vitamin C tablet   Take 1 tablet by mouth daily.      colchicine 0.6 MG tablet    Take 1 tablet (0.6 mg total) by mouth daily.      diltiazem 240 MG 24 hr capsule   Commonly known as: DILACOR XR   Take 1 capsule (240 mg total) by mouth daily.      esomeprazole 40 MG capsule   Commonly known as: NEXIUM   Take 1 capsule (40 mg total) by mouth daily.      Fish Oil 1000 MG Caps   Take 1,000 mg by mouth daily.      furosemide 40 MG tablet   Commonly known as: LASIX   Take 1 tablet (40 mg total) by mouth 2 (two) times daily.      Grape Seed 100 MG Caps   Take 1 capsule by mouth daily.      Vitamin D 1000 UNITS capsule   Take 1,000 Units by  mouth daily.      zolpidem 10 MG tablet   Commonly known as: AMBIEN   Take 1 tablet (10 mg total) by mouth at bedtime. For sleep          Discharge Orders    Future Appointments: Provider: Department: Dept Phone: Center:   02/28/2012 4:00 PM [facility] SITE 3 ECHO LAB [phone] None   02/29/2012 12:15 PM Duke Salvia, MD [facility] Main Office [place]) [phone] [facility]   02/29/2012 12:45 PM [facility] [phone] None     Future Orders Please Complete By Expires   Diet - low sodium heart healthy      Increase activity slowly      (HEART FAILURE PATIENTS) Call MD:  Anytime you have any of the following symptoms: 1) 3 pound weight gain in 24 hours or 5 pounds in 1 week 2) shortness of breath, with or without a dry hacking cough 3) swelling in the hands, feet or stomach 4) if you have to sleep on extra pillows at night in order to breathe.        Follow-up Information    Follow up with Sherryl Manges, MD. On 02/29/2012. (at 12:15 pm)    Contact information:   [address] [place] [address] [phone]       Follow up with [facility] [address] On 02/28/2012. (Be there at 3:30 PM for Echocardiogram at 4:00 pm)    Contact information:   [address] [place] [address]           BRING ALL MEDICATIONS WITH YOU TO FOLLOW UP APPOINTMENTS  Time spent with patient to include physician time: 41 min Signed: Theodore Demark 02/22/2012, 3:04 PM Co-Sign MD

## 2012-02-22 NOTE — Progress Notes (Signed)
  Patient Name: Selena Taylor      SUBJECTIVE: breathing better but with out energy  Walked yesterday and up and about todauy  Past Medical History  Diagnosis Date  . TIA (transient ischemic attack)   . PAF (paroxysmal atrial fibrillation)     has been on Flecainide in the past. Stopped 02/10/11; Remains on coumadin anticoagulation; intol to Rythmol and failed DCCV; noted to be in AFlutter 5/13;  Echo 01/2011:  EF 55-60%, mild MR, mild LAE.  Myoview 6/12:  No ischemia, EF 74%  . HTN (hypertension)   . Endometriosis   . Osteopenia   . Insomnia   . Chronic anticoagulation     on coumadin  . Anxiety     PHYSICAL EXAM Filed Vitals:   02/21/12 0732 02/21/12 1345 02/21/12 2100 02/22/12 0500  BP: 124/82 109/58 116/72 115/69  Pulse:  94 97 83  Temp:  98.1 F (36.7 C) 99.7 F (37.6 C) 98.8 F (37.1 C)  TempSrc:  Oral Oral Oral  Resp:  20 18 18   Height:  5' 2.5" (1.588 m)    Weight:      SpO2:  95% 95% 96%   Well developed and nourished in no acute distress HENT normal Neck supple with JVP-flat Carotids brisk and full without bruits Clear Irregularly irregular rate and rhythm with co **rapid ventricular response, no murmurs or gallops Abd-soft with active BS without hepatomegaly No Clubbing cyanosis edema Skin-warm and dry A & Oriented  Grossly normal sensory and motor function  TELEMETRY: Reviewed telemetry pt in  Af hr 100-105:    Intake/Output Summary (Last 24 hours) at 02/22/12 0830 Last data filed at 02/21/12 2259  Gross per 24 hour  Intake      0 ml  Output    700 ml  Net   -700 ml    LABS: Basic Metabolic Panel:  Lab 02/22/12 1610 02/20/12 1209 02/20/12 0510 02/19/12 0530 02/18/12 1848 02/18/12 0437 02/17/12 0513  NA 130* 131* 129* 133* 131* 131* 132*  K 4.2 3.3* 3.0* 3.3* 3.8 3.0* 3.3*  CL 94* 88* 89* 93* 91* 91* 94*  CO2 25 30 28 29 29 30 27   GLUCOSE 119* 109* 119* 138* 145* 123* 115*  BUN 20 19 19 16 15 17 18   CREATININE 0.85 0.74 0.77 0.80 0.88 0.88  0.76  CALCIUM 8.5 8.8 -- -- -- -- --  MG -- -- -- -- -- -- --  PHOS -- -- -- -- -- -- --   Cardiac Enzymes: No results found for this basename: CKTOTAL:3,CKMB:3,CKMBINDEX:3,TROPONINI:3 in the last 72 hours CBC: No results found for this basename: WBC:7,NEUTROABS:7,HGB:7,HCT:7,MCV:7,PLT:7 in the last 168 hours PROTIME:  Basename 02/22/12 0456 02/20/12 0510  LABPROT 15.6* 16.0*  INR 1.27 1.31   Liver Function Tests: No results found for this basename: AST:2,ALT:2,ALKPHOS:2,BILITOT:2,PROT:2,ALBUMIN:2 in the last 72 hours No results found for this basename: LIPASE:2,AMYLASE:2 in the last 72 hours BNP: BNP (last 3 results)  Basename 02/05/12 1008  PROBNP 603.1*    ASSESSMENT AND PLAN:  Patient Active Hospital Problem List: Atrial fibrillation/flutter (11/03/2006)   Pericarditis with effusion (02/12/2012)   Acute diastolic heart failure (02/15/2012)    dischrge to home  Echo next Monday in our office followup efffusion; BMET amio 400 bid x 2 weeks then 400 dlt 240 Lasix 40 po bid Colchicine  F/u sk nex wed  Add on   Signed, Sherryl Manges MD  02/22/2012

## 2012-02-22 NOTE — Progress Notes (Signed)
Pt discharged to home per MD order. Pt and family received and reviewed all discharge instructions and medication information including follow-up appointments and prescriptions. Pt also given heart failure education information at discharge.  Pt and family verbalized understanding. Pt alert and oriented at discharge with no complaints of pain. Pt escorted to private vehicle via wheelchair by nurse tech. Efraim Kaufmann

## 2012-02-23 ENCOUNTER — Emergency Department (HOSPITAL_COMMUNITY): Payer: Medicare Other

## 2012-02-23 ENCOUNTER — Inpatient Hospital Stay (HOSPITAL_COMMUNITY)
Admission: EM | Admit: 2012-02-23 | Discharge: 2012-02-29 | DRG: 238 | Disposition: A | Discharge status: Home / Self Care | Payer: Medicare Other | Attending: Internal Medicine | Admitting: Internal Medicine

## 2012-02-23 ENCOUNTER — Telehealth: Payer: Self-pay | Admitting: Internal Medicine

## 2012-02-23 ENCOUNTER — Encounter (HOSPITAL_COMMUNITY): Payer: Self-pay | Admitting: Family Medicine

## 2012-02-23 DIAGNOSIS — I319 Disease of pericardium, unspecified: Secondary | ICD-10-CM | POA: Diagnosis present

## 2012-02-23 DIAGNOSIS — Z7901 Long term (current) use of anticoagulants: Secondary | ICD-10-CM

## 2012-02-23 DIAGNOSIS — I10 Essential (primary) hypertension: Secondary | ICD-10-CM | POA: Diagnosis present

## 2012-02-23 DIAGNOSIS — E871 Hypo-osmolality and hyponatremia: Secondary | ICD-10-CM | POA: Diagnosis present

## 2012-02-23 DIAGNOSIS — D62 Acute posthemorrhagic anemia: Secondary | ICD-10-CM | POA: Diagnosis not present

## 2012-02-23 DIAGNOSIS — E876 Hypokalemia: Secondary | ICD-10-CM | POA: Diagnosis present

## 2012-02-23 DIAGNOSIS — I300 Acute nonspecific idiopathic pericarditis: Principal | ICD-10-CM | POA: Diagnosis present

## 2012-02-23 DIAGNOSIS — I309 Acute pericarditis, unspecified: Secondary | ICD-10-CM

## 2012-02-23 DIAGNOSIS — I4891 Unspecified atrial fibrillation: Secondary | ICD-10-CM | POA: Diagnosis present

## 2012-02-23 DIAGNOSIS — R0781 Pleurodynia: Secondary | ICD-10-CM

## 2012-02-23 DIAGNOSIS — R0789 Other chest pain: Secondary | ICD-10-CM | POA: Diagnosis present

## 2012-02-23 DIAGNOSIS — Z85828 Personal history of other malignant neoplasm of skin: Secondary | ICD-10-CM

## 2012-02-23 DIAGNOSIS — R791 Abnormal coagulation profile: Secondary | ICD-10-CM | POA: Diagnosis present

## 2012-02-23 DIAGNOSIS — Z8673 Personal history of transient ischemic attack (TIA), and cerebral infarction without residual deficits: Secondary | ICD-10-CM

## 2012-02-23 DIAGNOSIS — F411 Generalized anxiety disorder: Secondary | ICD-10-CM | POA: Diagnosis present

## 2012-02-23 DIAGNOSIS — I313 Pericardial effusion (noninflammatory): Secondary | ICD-10-CM

## 2012-02-23 DIAGNOSIS — I509 Heart failure, unspecified: Secondary | ICD-10-CM | POA: Diagnosis present

## 2012-02-23 DIAGNOSIS — I5032 Chronic diastolic (congestive) heart failure: Secondary | ICD-10-CM | POA: Diagnosis present

## 2012-02-23 DIAGNOSIS — R072 Precordial pain: Secondary | ICD-10-CM

## 2012-02-23 DIAGNOSIS — I3139 Other pericardial effusion (noninflammatory): Secondary | ICD-10-CM

## 2012-02-23 HISTORY — DX: Pericardial effusion (noninflammatory): I31.3

## 2012-02-23 HISTORY — DX: Other pericardial effusion (noninflammatory): I31.39

## 2012-02-23 HISTORY — DX: Shortness of breath: R06.02

## 2012-02-23 HISTORY — DX: Heart failure, unspecified: I50.9

## 2012-02-23 LAB — CBC WITH DIFFERENTIAL/PLATELET
Basophils Relative: 0 % (ref 0–1)
Eosinophils Absolute: 0.1 10*3/uL (ref 0.0–0.7)
HCT: 37.6 % (ref 36.0–46.0)
Hemoglobin: 13.1 g/dL (ref 12.0–15.0)
MCH: 31.6 pg (ref 26.0–34.0)
MCHC: 34.8 g/dL (ref 30.0–36.0)
Monocytes Absolute: 1.1 10*3/uL — ABNORMAL HIGH (ref 0.1–1.0)
Monocytes Relative: 9 % (ref 3–12)

## 2012-02-23 LAB — COMPREHENSIVE METABOLIC PANEL
Albumin: 2.5 g/dL — ABNORMAL LOW (ref 3.5–5.2)
BUN: 21 mg/dL (ref 6–23)
Chloride: 91 mEq/L — ABNORMAL LOW (ref 96–112)
Creatinine, Ser: 0.9 mg/dL (ref 0.50–1.10)
GFR calc Af Amer: 71 mL/min — ABNORMAL LOW (ref >90)
Total Bilirubin: 0.8 mg/dL (ref 0.3–1.2)
Total Protein: 7.2 g/dL (ref 6.0–8.3)

## 2012-02-23 LAB — PRO B NATRIURETIC PEPTIDE: Pro B Natriuretic peptide (BNP): 1067 pg/mL — ABNORMAL HIGH (ref 0–125)

## 2012-02-23 LAB — POCT I-STAT TROPONIN I

## 2012-02-23 IMAGING — CR DG CHEST 2V
2 series · 2 of 2 positions shown · non-contrast
Comparison: None.

CLINICAL DATA: Chest pain and shortness of breath.

CHEST - 2 VIEW

[w chest pa]
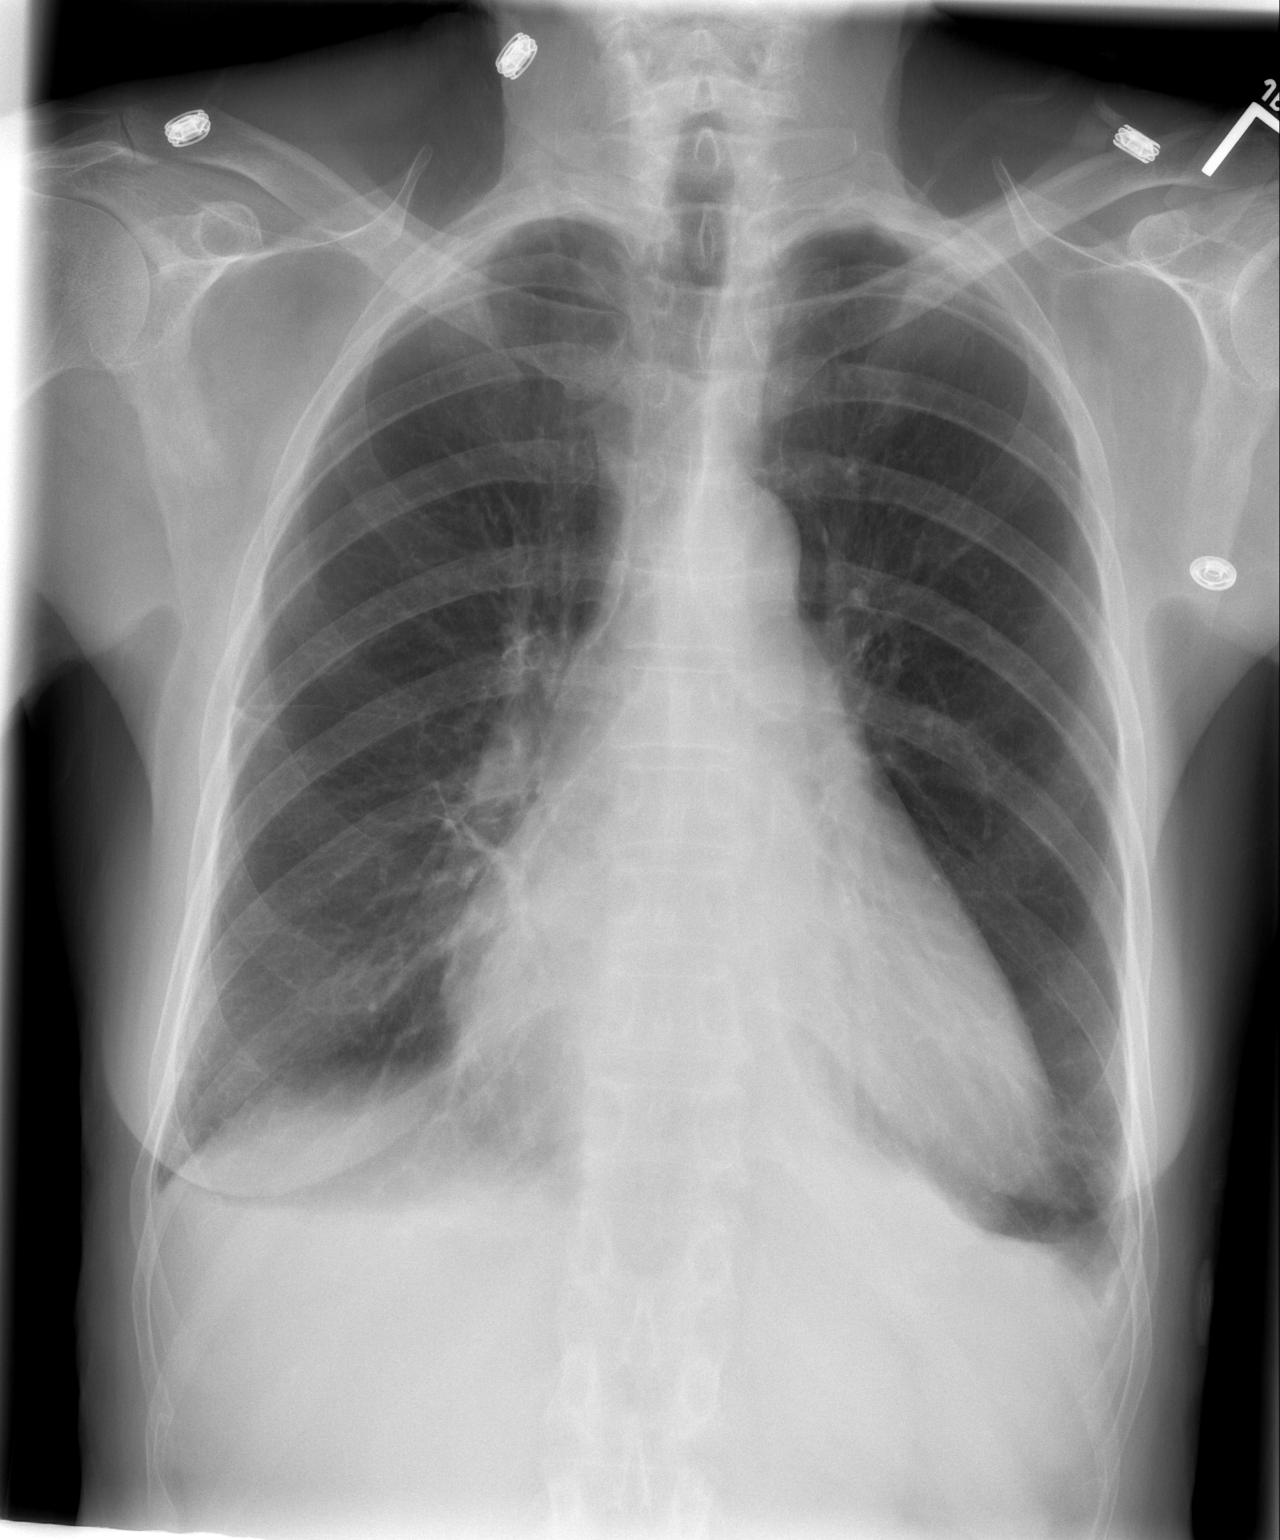

[w chest lat]
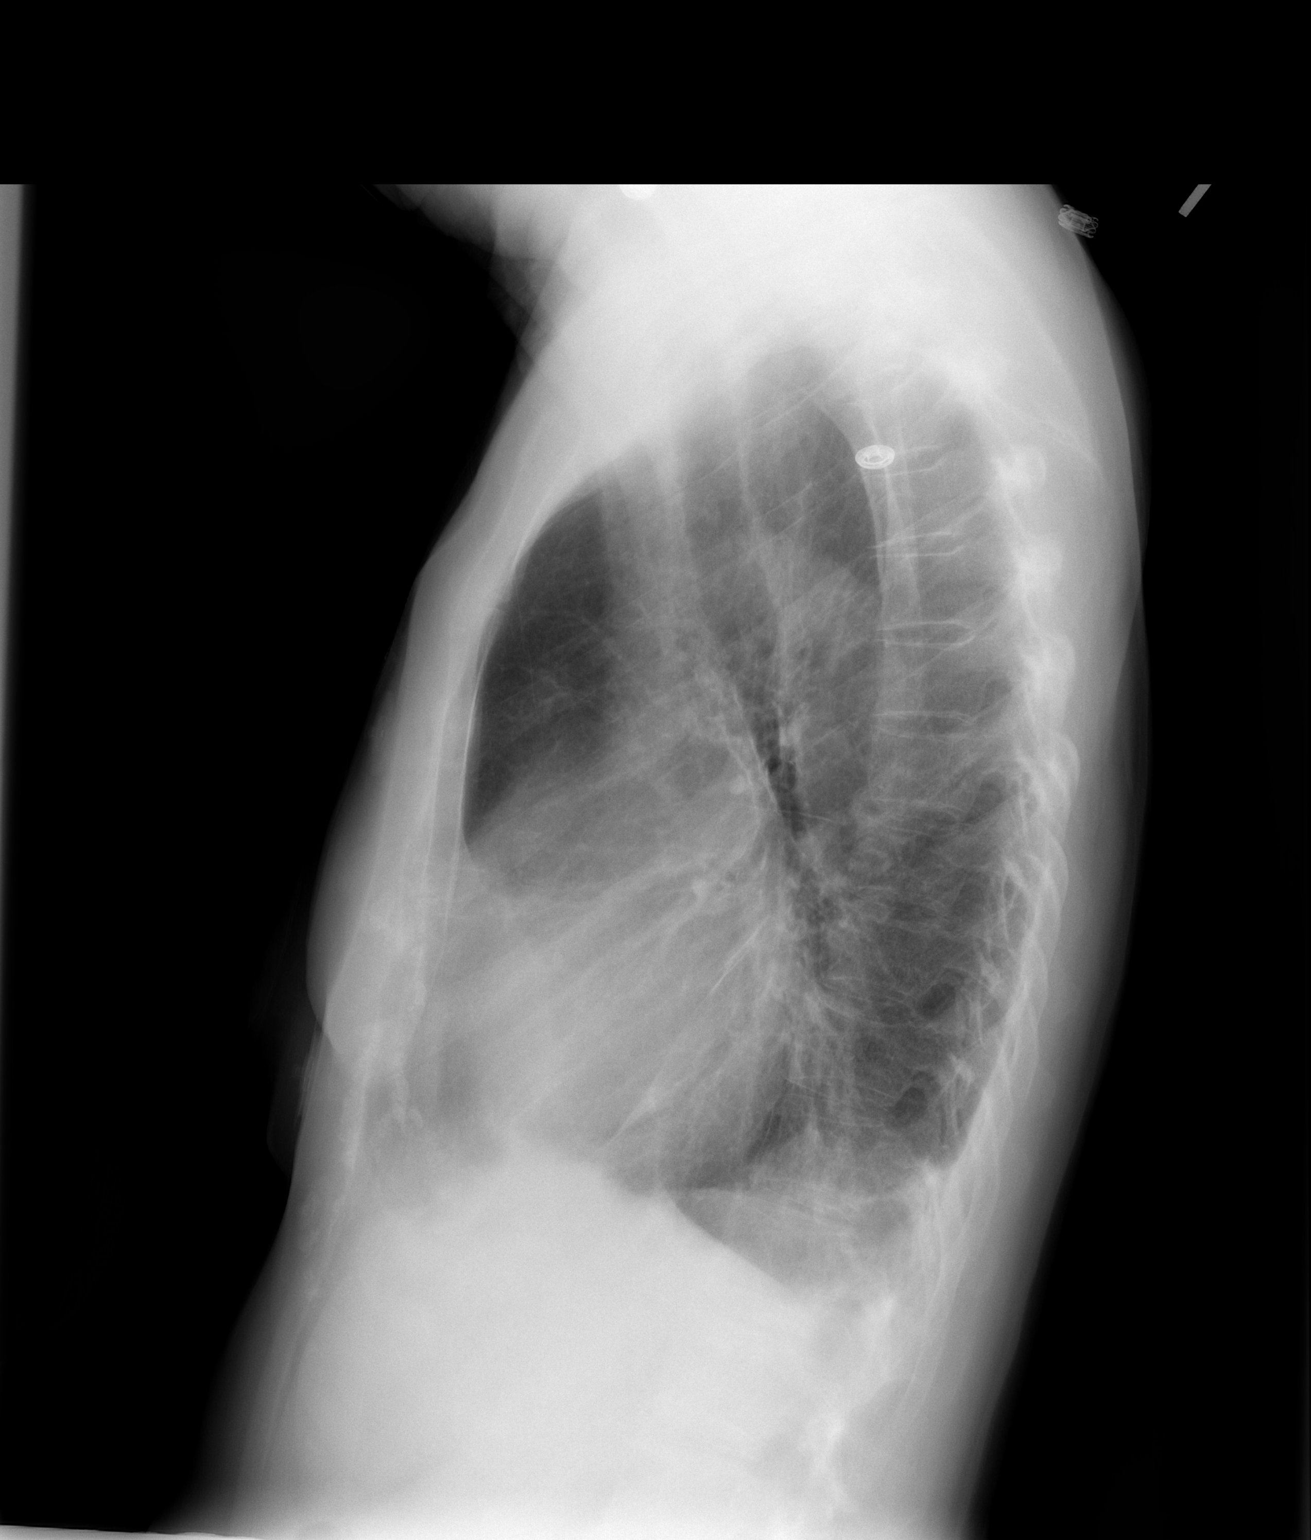

[2 of 2 positions shown; findings below may reference images not displayed]

FINDINGS: Lungs are hyperexpanded.  No edema or focal airspace
consolidation. Small bilateral pleural effusions seen on the
previous study appear to have nearly resolved.  The bibasilar
atelectasis/infiltrate also in the nearly resolved. The
cardiopericardial silhouette is enlarged. Imaged bony structures of
the thorax are intact.
IMPRESSION: Hyperinflation with cardiomegaly.

Interval near resolution of bibasilar atelectasis and small
bilateral pleural effusions.

## 2012-02-23 MED ORDER — NITROGLYCERIN 0.4 MG SL SUBL: 0.4000 mg | SUBLINGUAL_TABLET | SUBLINGUAL | Status: DC | PRN

## 2012-02-23 MED ORDER — ASPIRIN EC 81 MG PO TBEC
81.0000 mg | DELAYED_RELEASE_TABLET | Freq: Every day | ORAL | Status: DC
  Administered 2012-02-24 – 2012-02-28 (×4): 81 mg via ORAL
  Filled 2012-02-23 (×5): qty 1

## 2012-02-23 MED ORDER — SODIUM CHLORIDE 0.9 % IJ SOLN: 3.0000 mL | INTRAMUSCULAR | Status: DC | PRN

## 2012-02-23 MED ORDER — DILTIAZEM HCL ER 240 MG PO CP24
240.0000 mg | ORAL_CAPSULE | Freq: Every day | ORAL | Status: DC
  Administered 2012-02-23 – 2012-02-29 (×6): 240 mg via ORAL
  Filled 2012-02-23 (×7): qty 1

## 2012-02-23 MED ORDER — PANTOPRAZOLE SODIUM 40 MG PO TBEC
40.0000 mg | DELAYED_RELEASE_TABLET | Freq: Every day | ORAL | Status: DC
  Administered 2012-02-23 – 2012-02-29 (×6): 40 mg via ORAL
  Filled 2012-02-23 (×7): qty 1

## 2012-02-23 MED ORDER — SODIUM CHLORIDE 0.9 % IV SOLN: 250.0000 mL | INTRAVENOUS | Status: DC | PRN

## 2012-02-23 MED ORDER — AMIODARONE HCL 200 MG PO TABS
400.0000 mg | ORAL_TABLET | Freq: Two times a day (BID) | ORAL | Status: DC
  Administered 2012-02-23 – 2012-02-29 (×11): 400 mg via ORAL
  Filled 2012-02-23 (×14): qty 2

## 2012-02-23 MED ORDER — ACETAMINOPHEN 325 MG PO TABS
650.0000 mg | ORAL_TABLET | ORAL | Status: DC | PRN
  Administered 2012-02-23: 650 mg via ORAL
  Filled 2012-02-23: qty 2

## 2012-02-23 MED ORDER — ASPIRIN 81 MG PO CHEW
324.0000 mg | CHEWABLE_TABLET | ORAL | Status: AC
  Administered 2012-02-23: 324 mg via ORAL
  Filled 2012-02-23: qty 4

## 2012-02-23 MED ORDER — SALINE SPRAY 0.65 % NA SOLN
1.0000 | NASAL | Status: DC | PRN
  Administered 2012-02-24: 1 via NASAL
  Filled 2012-02-23 (×2): qty 44

## 2012-02-23 MED ORDER — COLCHICINE 0.6 MG PO TABS
0.6000 mg | ORAL_TABLET | Freq: Every day | ORAL | Status: DC
  Administered 2012-02-24 – 2012-02-28 (×4): 0.6 mg via ORAL
  Filled 2012-02-23 (×5): qty 1

## 2012-02-23 MED ORDER — ASPIRIN 300 MG RE SUPP: 300.0000 mg | RECTAL | Status: AC

## 2012-02-23 MED ORDER — ZOLPIDEM TARTRATE 5 MG PO TABS
5.0000 mg | ORAL_TABLET | Freq: Every day | ORAL | Status: DC
  Administered 2012-02-23 – 2012-02-24 (×2): 5 mg via ORAL
  Filled 2012-02-23 (×2): qty 1

## 2012-02-23 MED ORDER — ONDANSETRON HCL 4 MG/2ML IJ SOLN: 4.0000 mg | Freq: Four times a day (QID) | INTRAMUSCULAR | Status: DC | PRN

## 2012-02-23 MED ORDER — SODIUM CHLORIDE 0.9 % IJ SOLN
3.0000 mL | Freq: Two times a day (BID) | INTRAMUSCULAR | Status: DC
  Administered 2012-02-24 (×2): 3 mL via INTRAVENOUS

## 2012-02-23 NOTE — Progress Notes (Signed)
Utilization Review Completed.   Rhealyn Cullen, RN, BSN Nurse Case Manager  [phone]  

## 2012-02-23 NOTE — Telephone Encounter (Signed)
New problem:   Recent discharge from [facility].     C/o  Pain scale is 7  Right side rib cage. Sob. No chestpain.

## 2012-02-23 NOTE — Progress Notes (Signed)
Patient's BP on admission was 84/46; patient's BP rechecked an hour later and BP is now 95/82; will continue to monitor patient. Lorretta Harp RN

## 2012-02-23 NOTE — Progress Notes (Signed)
  Echocardiogram 2D Echocardiogram Limited has been performed.  Blaze Sandin FRANCES 02/23/2012, 5:48 PM

## 2012-02-23 NOTE — ED Notes (Signed)
Pt refused Tylenol. 

## 2012-02-23 NOTE — Telephone Encounter (Signed)
Pt recently dc from hospital for afib/ sob/ pericardial effusion, pt went to bed feeling well and was awoken at 5 am with right sided cp, sob,  pain constant at 7/10 with worsening pain with deep breath. bp 145/92 p 110, pt has been given tylenol for pain, has not eaten today, pt told to go into Er, called Rosann AuerbachAnnabelle Harman PA answered and was made aware of her condition and will be coming to ED, told to go by ambulance but the family said they are accustom to snow and will bring her in.

## 2012-02-23 NOTE — Progress Notes (Signed)
Patient requested ocean nose spray.  MD Mclean notified.  Will continue to monitor. Louretta Parma, RN

## 2012-02-23 NOTE — ED Provider Notes (Signed)
History     CSN: [id number]  Arrival date & time 02/23/12  1239   First MD Initiated Contact with Patient 02/23/12 1313      Chief Complaint  Patient presents with  . Chest Pain  . Shortness of Breath    (Consider location/radiation/quality/duration/timing/severity/associated sxs/prior treatment) HPI Comments: Selena Taylor is a 77 y.o. Female who presents for evaluation of right-sided chest pain. The pain started. This morning, and has improved after taking Tylenol. She was worried that her heart failure, had gotten worse, so came in for evaluation. The pain is worse with deep breathing, and improves with rest. No associated fever, chills, nausea, vomiting. She did not eat today, in case she needed to have another test done. She was in the hospital yesterday after a nine-day visit for congestive heart failure. During that time she had some medication changes, including stopping warfarin. There are no other complaints and no other modifying factors.  Patient is a 77 y.o. female presenting with chest pain and shortness of breath. The history is provided by the patient.  Chest Pain Primary symptoms include shortness of breath.    Shortness of Breath  Associated symptoms include chest pain and shortness of breath.    Past Medical History  Diagnosis Date  . TIA (transient ischemic attack)   . PAF (paroxysmal atrial fibrillation)     has been on Flecainide in the past. Stopped 02/10/11; Remains on coumadin anticoagulation; intol to Rythmol and failed DCCV; noted to be in AFlutter 5/13;  Echo 01/2011:  EF 55-60%, mild MR, mild LAE.  Myoview 6/12:  No ischemia, EF 74%  . HTN (hypertension)   . Endometriosis   . Osteopenia   . Insomnia   . Chronic anticoagulation     on coumadin  . Anxiety   . Pericardial effusion   . Shortness of breath   . Pericardial effusion 02/23/2012  . CHF (congestive heart failure)     Past Surgical History  Procedure Date  . Polypectomy   . Total  hysterectomy and bilateral salpingoopherectomy   . Appendectomy   . Skin cancer removal   . Cardioversion 01/14/2011    Procedure: CARDIOVERSION;  Surgeon: Duke Salvia, MD;  Location: [facility] OR;  Service: Cardiovascular;  Laterality: N/A;  To be completed in Neuro OR 33 time slot 0830 12/20  . Cardioversion 05/27/2011    Procedure: CARDIOVERSION;  Surgeon: Duke Salvia, MD;  Location: [facility] OR;  Service: Cardiovascular;  Laterality: N/A;    Family History  Problem Relation Age of Onset  . Heart disease      Father age 22    History  Substance Use Topics  . Smoking status: Never Smoker   . Smokeless tobacco: Never Used  . Alcohol Use: 8.4 oz/week    14 Glasses of wine per week     Comment: 1-2 glasses of wine nightly    OB History    Grav Para Term Preterm Abortions TAB SAB Ect Mult Living                  Review of Systems  Respiratory: Positive for shortness of breath.   Cardiovascular: Positive for chest pain.  All other systems reviewed and are negative.    Allergies  Other and Procaine hcl  Home Medications   No current outpatient prescriptions on file.  BP 84/46  Pulse 98  Temp 97.6 F (36.4 C) (Oral)  Resp 20  Ht 5\' 2"  (1.575 m)  Wt 121  lb 12.8 oz (55.248 kg)  BMI 22.28 kg/m2  SpO2 98%  Physical Exam  Nursing note and vitals reviewed. Constitutional: She is oriented to person, place, and time. She appears well-developed and well-nourished.  HENT:  Head: Normocephalic and atraumatic.  Eyes: Conjunctivae normal and EOM are normal. Pupils are equal, round, and reactive to light.  Neck: Normal range of motion and phonation normal. Neck supple.  Cardiovascular: Normal rate and intact distal pulses.        Irregular rhythm, borderline tachycardic rates on cardiac monitor vary from 95-120 during examination  Pulmonary/Chest: Effort normal and breath sounds normal. She exhibits no tenderness.  Abdominal: Soft. She exhibits no distension. There is no  tenderness. There is no guarding.  Musculoskeletal: Normal range of motion. She exhibits no edema.  Neurological: She is alert and oriented to person, place, and time. She has normal strength. She exhibits normal muscle tone.  Skin: Skin is warm and dry.  Psychiatric: Her behavior is normal. Judgment and thought content normal.       Anxious    ED Course  Procedures (including critical care time)  Screening labs, and chest x-ray, ordered  We'll contact cardiology to evaluate the patient, in the ED      Date: 11/12/2011  Rate: 103  Rhythm: atrial fibrillation  QRS Axis: normal  PR and QT Intervals: QT normal  ST/T Wave abnormalities: nonspecific ST changes  PR and QRS Conduction Disutrbances:QT normal  Narrative Interpretation:   Old EKG Reviewed: changes noted- rate slower       Labs Reviewed  CBC WITH DIFFERENTIAL - Abnormal; Notable for the following:    WBC 12.4 (*)     Platelets 433 (*)     Neutro Abs 9.2 (*)     Monocytes Absolute 1.1 (*)     All other components within normal limits  COMPREHENSIVE METABOLIC PANEL - Abnormal; Notable for the following:    Sodium 129 (*)     Potassium 3.4 (*)  DELTA CHECK NOTED   Chloride 91 (*)     Glucose, Bld 108 (*)     Albumin 2.5 (*)     ALT 38 (*)     Alkaline Phosphatase 142 (*)     GFR calc non Af Amer 61 (*)     GFR calc Af Amer 71 (*)     All other components within normal limits  PRO B NATRIURETIC PEPTIDE - Abnormal; Notable for the following:    Pro B Natriuretic peptide (BNP) 1067.0 (*)     All other components within normal limits  POCT I-STAT TROPONIN I  CBC  BASIC METABOLIC PANEL  PROTIME-INR   Dg Chest 2 View  02/23/2012  *RADIOLOGY REPORT*  Clinical Data: Chest pain and shortness of breath.  CHEST - 2 VIEW  Comparison: None.  Findings: Lungs are hyperexpanded.  No edema or focal airspace consolidation. Small bilateral pleural effusions seen on the previous study appear to have nearly resolved.  The  bibasilar atelectasis/infiltrate also in the nearly resolved. The cardiopericardial silhouette is enlarged. Imaged bony structures of the thorax are intact.  IMPRESSION: Hyperinflation with cardiomegaly.  Interval near resolution of bibasilar atelectasis and small bilateral pleural effusions.   Original Report Authenticated By: Kennith Center, M.D.    Nursing notes, applicable records and vitals reviewed.  Radiologic Images/Reports reviewed.   1. Pleuritic chest pain   2. Atrial fibrillation with RVR   3. Pericarditis with effusion       MDM  Nonspecific  chest pain, with tachycardia and atrial fibrillation. Chest x-ray indicates bilateral pleural effusions. Cardiology consult was in ED, and admitted the patient    Plan: Admit    Flint Melter, MD 02/23/12 [phone]

## 2012-02-23 NOTE — ED Notes (Signed)
Patient transported to X-ray 

## 2012-02-23 NOTE — ED Notes (Signed)
Per pt was discharged from the hospital yesterday with CHF and now is having right sided chest pain and SOB. sts it hurts when she breathes.

## 2012-02-23 NOTE — H&P (Signed)
Patient ID: Selena Taylor MRN: [id number] DOB/AGE: [birth year] 75 y.o. Admit date: 02/23/2012  Primary Care Physician: Primary Cardiologist: Graciela Husbands  HPI: 77 yo female with history of chronic atrial fibrillation (coumadin held since last week), TIA, HTN, pericardial effusion who was discharged home yesterday after being managed for CHF and her pericardial effusion and is now readmitted with c/o SOB, chest pain. She was followed by Dr. Graciela Husbands for last week with close observation of a large pericardial effusion. CT surgery evaluated last week for pericardial window but she was not in tamponade so no window was performed. She has been treated with colchicine for her pericarditis. She had been in indomethacin two weeks ago for pericarditis but this was stopped last week. Her coumadin has been held in anticipation of possible pericardiocentesis.   She felt well last night but woke up with right sided chest pain, worsened with inspiration. Some SOB but no real change from yesterday. No dizziness or syncope but feeling weak.   Review of systems complete and found to be negative unless listed above   Past Medical History  Diagnosis Date  . TIA (transient ischemic attack)   . PAF (paroxysmal atrial fibrillation)     has been on Flecainide in the past. Stopped 02/10/11; Remains on coumadin anticoagulation; intol to Rythmol and failed DCCV; noted to be in AFlutter 5/13;  Echo 01/2011:  EF 55-60%, mild MR, mild LAE.  Myoview 6/12:  No ischemia, EF 74%  . HTN (hypertension)   . Endometriosis   . Osteopenia   . Insomnia   . Chronic anticoagulation     on coumadin  . Anxiety   . Pericardial effusion     Family History  Problem Relation Age of Onset  . Heart disease      Father age 28    History   Social History  . Marital Status: Married    Spouse Name: N/A    Number of Children: N/A  . Years of Education: N/A   Occupational History  . Not on file.   Social History Main Topics  . Smoking  status: Never Smoker   . Smokeless tobacco: Not on file  . Alcohol Use: 8.4 oz/week    14 Glasses of wine per week     Comment: 1-2 glasses of wine nightly  . Drug Use: No  . Sexually Active: Not Currently   Other Topics Concern  . 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    Past Surgical History  Procedure Date  . Polypectomy   . Total hysterectomy and bilateral salpingoopherectomy   . Appendectomy   . Skin cancer removal   . Cardioversion 01/14/2011    Procedure: CARDIOVERSION;  Surgeon: Duke Salvia, MD;  Location: [facility] OR;  Service: Cardiovascular;  Laterality: N/A;  To be completed in Neuro OR 33 time slot 0830 12/20  . Cardioversion 05/27/2011    Procedure: CARDIOVERSION;  Surgeon: Duke Salvia, MD;  Location: [facility] OR;  Service: Cardiovascular;  Laterality: N/A;    Allergies  Allergen Reactions  . Other Other (See Comments)    Novocaine.  REACTION:  Rapid heart rate  . Procaine Hcl     Rapid heart rate    Prior to Admission Meds: See computer list  Physical Exam: Blood pressure 109/80,  pulse 112, temperature 97.7 F (36.5 C), resp. rate 18, SpO2 94.00%.    General: Well developed, well nourished, NAD  HEENT: OP clear, mucus membranes moist  SKIN: warm, dry. No rashes.  Neuro: No focal deficits  Musculoskeletal: Muscle strength 5/5 all ext  Psychiatric: Mood and affect normal  Neck: No JVD, no carotid bruits, no thyromegaly, no lymphadenopathy.  Lungs:Clear bilaterally, no wheezes, rhonci, crackles  Cardiovascular: Irregular No murmurs, gallops or rubs.  Abdomen:Soft. Bowel sounds present. Non-tender.  Extremities: No lower extremity edema. Pulses are 2 + in the bilateral DP/PT.   Labs:   Lab Results  Component Value Date   WBC 12.4* 02/23/2012   HGB  13.1 02/23/2012   HCT 37.6 02/23/2012   MCV 90.8 02/23/2012   PLT 433* 02/23/2012     Lab 02/22/12 0456  NA 130*  K 4.2  CL 94*  CO2 25  BUN 20  CREATININE 0.85  CALCIUM 8.5  PROT --  BILITOT --  ALKPHOS --  ALT --  AST --  GLUCOSE 119*      Radiology: Chest x-ray: Resolved pleural effusions. No acute disease  EKG: Atrial fibrillation, Non-specific ST changes. Grossly unchanged from prior EKG  ASSESSMENT AND PLAN:   1. Pericardial effusion - She is known to have a moderate to large pericardial effusion by echo, most recently 02/21/12 with no signs of tamponade. She has had pain felt to be secondary to her effusion with pericarditis. She is being treated with colchicine by Dr. Graciela Husbands. Will repeat limited echo today to assess effusion. Continue colchicine and consider restarting NSAIDs.   2. Atrial fibrillation - Rate controlled with amiodarone and diltiazem. Not on anticoag currently with pericardial effusion. Her coumadin was held last week.    3. Chronic diastolic HF - Volume status appears to be ok.    4. Chest pain: Atypical. Most likely related to her pericardial effusion.    MCALHANY,CHRISTOPHER 02/23/2012, 1:35 PM

## 2012-02-24 ENCOUNTER — Inpatient Hospital Stay (HOSPITAL_COMMUNITY): Payer: Medicare Other

## 2012-02-24 ENCOUNTER — Encounter (HOSPITAL_COMMUNITY): Payer: Self-pay | Admitting: Radiology

## 2012-02-24 ENCOUNTER — Encounter (HOSPITAL_COMMUNITY): Payer: Self-pay | Admitting: Anesthesiology

## 2012-02-24 DIAGNOSIS — I309 Acute pericarditis, unspecified: Secondary | ICD-10-CM

## 2012-02-24 DIAGNOSIS — R0602 Shortness of breath: Secondary | ICD-10-CM

## 2012-02-24 DIAGNOSIS — R071 Chest pain on breathing: Secondary | ICD-10-CM

## 2012-02-24 LAB — CBC
HCT: 34.4 % — ABNORMAL LOW (ref 36.0–46.0)
Hemoglobin: 11.8 g/dL — ABNORMAL LOW (ref 12.0–15.0)
MCH: 30 pg (ref 26.0–34.0)
MCH: 31.1 pg (ref 26.0–34.0)
MCHC: 33.1 g/dL (ref 30.0–36.0)
MCHC: 34.3 g/dL (ref 30.0–36.0)
MCV: 90.8 fL (ref 78.0–100.0)
Platelets: 419 10*3/uL — ABNORMAL HIGH (ref 150–400)
Platelets: 432 10*3/uL — ABNORMAL HIGH (ref 150–400)
RBC: 4.07 MIL/uL (ref 3.87–5.11)
RBC: 3.79 MIL/uL — ABNORMAL LOW (ref 3.87–5.11)
RDW: 13.3 % (ref 11.5–15.5)
WBC: 11 10*3/uL — ABNORMAL HIGH (ref 4.0–10.5)

## 2012-02-24 LAB — COMPREHENSIVE METABOLIC PANEL
ALT: 37 U/L — ABNORMAL HIGH (ref 0–35)
AST: 31 U/L (ref 0–37)
Albumin: 2.3 g/dL — ABNORMAL LOW (ref 3.5–5.2)
Alkaline Phosphatase: 150 U/L — ABNORMAL HIGH (ref 39–117)
BUN: 21 mg/dL (ref 6–23)
CO2: 24 mEq/L (ref 19–32)
Calcium: 8.6 mg/dL (ref 8.4–10.5)
Chloride: 95 mEq/L — ABNORMAL LOW (ref 96–112)
Creatinine, Ser: 0.74 mg/dL (ref 0.50–1.10)
GFR calc Af Amer: >90 mL/min (ref >90)
GFR calc non Af Amer: 82 mL/min — ABNORMAL LOW (ref >90)
Glucose, Bld: 122 mg/dL — ABNORMAL HIGH (ref 70–99)
Potassium: 4.1 mEq/L (ref 3.5–5.1)
Sodium: 131 mEq/L — ABNORMAL LOW (ref 135–145)
Total Bilirubin: 0.4 mg/dL (ref 0.3–1.2)
Total Protein: 7 g/dL (ref 6.0–8.3)

## 2012-02-24 LAB — URINE MICROSCOPIC-ADD ON

## 2012-02-24 LAB — URINALYSIS, ROUTINE W REFLEX MICROSCOPIC
Bilirubin Urine: NEGATIVE
Glucose, UA: NEGATIVE mg/dL
Hgb urine dipstick: NEGATIVE
Ketones, ur: NEGATIVE mg/dL
Leukocytes, UA: NEGATIVE
Nitrite: NEGATIVE
Protein, ur: 30 mg/dL — AB
Specific Gravity, Urine: 1.01 (ref 1.005–1.030)
Urobilinogen, UA: 0.2 mg/dL (ref 0.0–1.0)
pH: 6 (ref 5.0–8.0)

## 2012-02-24 LAB — BASIC METABOLIC PANEL
Calcium: 9 mg/dL (ref 8.4–10.5)
GFR calc non Af Amer: 64 mL/min — ABNORMAL LOW (ref >90)
Potassium: 3.2 mEq/L — ABNORMAL LOW (ref 3.5–5.1)
Sodium: 130 mEq/L — ABNORMAL LOW (ref 135–145)

## 2012-02-24 LAB — TYPE AND SCREEN
ABO/RH(D): O POS
Antibody Screen: NEGATIVE

## 2012-02-24 LAB — PROTIME-INR
INR: 1.18 (ref 0.00–1.49)
INR: 1.24 (ref 0.00–1.49)
Prothrombin Time: 14.8 seconds (ref 11.6–15.2)
Prothrombin Time: 15.4 seconds — ABNORMAL HIGH (ref 11.6–15.2)

## 2012-02-24 LAB — APTT: aPTT: 31 seconds (ref 24–37)

## 2012-02-24 IMAGING — CT CT ANGIO CHEST
2 of 6 series · 18 of 46 positions shown · IV contrast (APPLIED)
Comparison: Chest CT - [DATE]

CLINICAL DATA: Shortness of breath, lower extremity edema, history
of pericarditis, evaluate for pulmonary embolism

CT ANGIOGRAPHY CHEST
TECHNIQUE: Multidetector CT imaging of the chest using the
standard protocol during bolus administration of intravenous
contrast. Multiplanar reconstructed images including MIPs were
obtained and reviewed to evaluate the vascular anatomy.
Contrast: 100mL OMNIPAQUE IOHEXOL 350 MG/ML SOLN

[Series 6: pulm embolism 1.0 b25f thin · axial · 0.58mm/px · z∈[-424,-160]mm · 15 of 290 slices shown]
[im 13/290  lung]
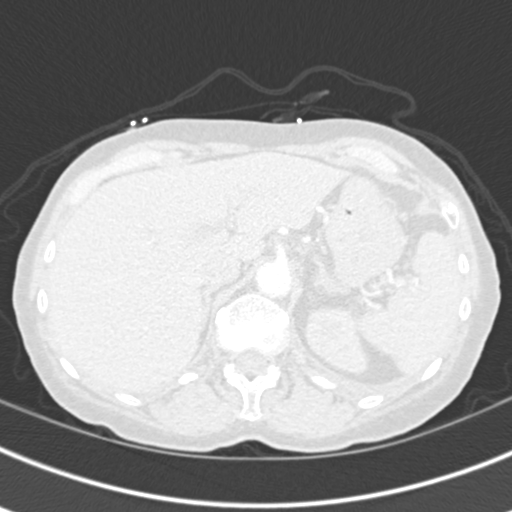
[im 38/290  soft-tissue]
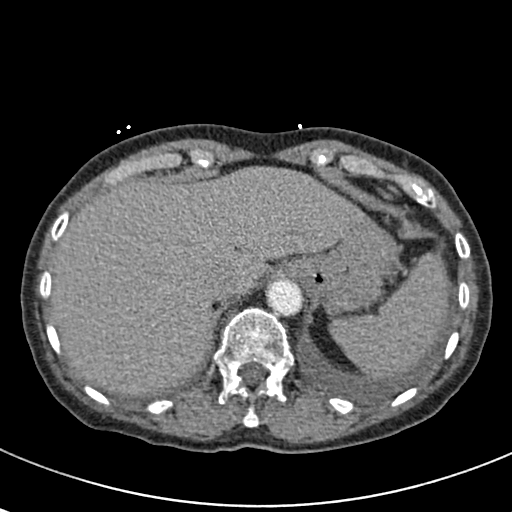
[im 51/290  lung]
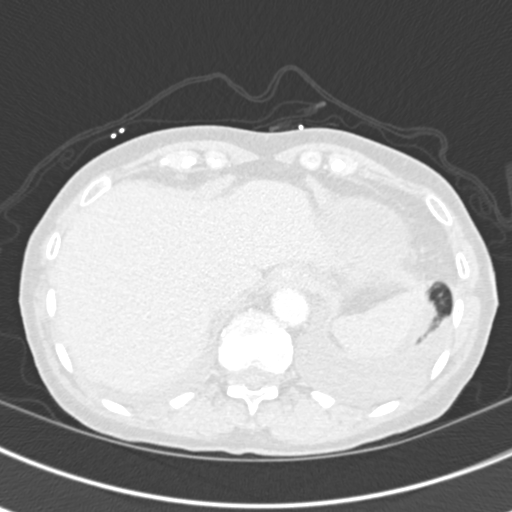
[im 76/290  soft-tissue]
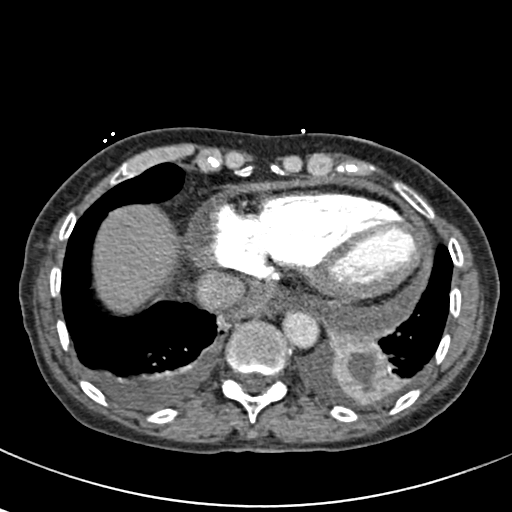
[im 88/290  lung]
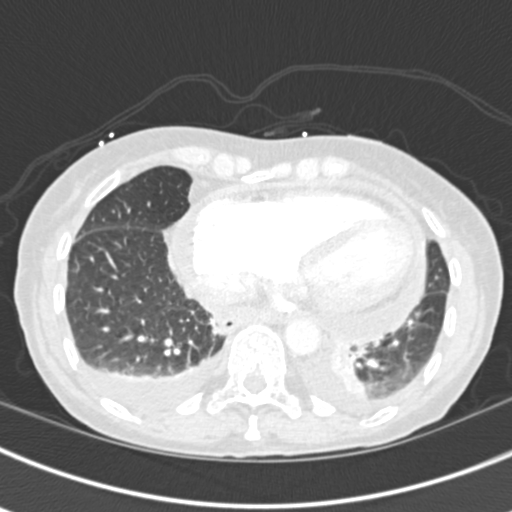
[im 114/290  soft-tissue]
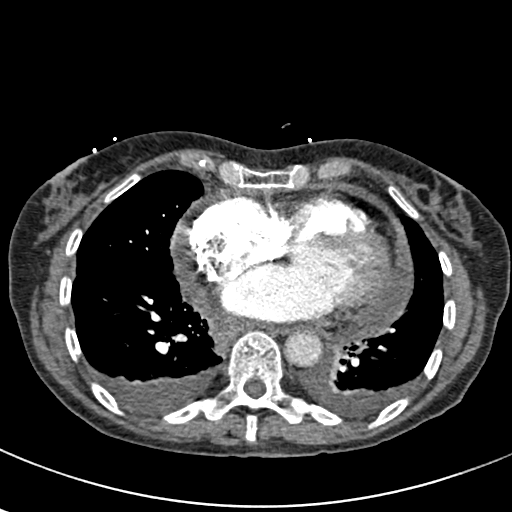
[im 126/290  lung]
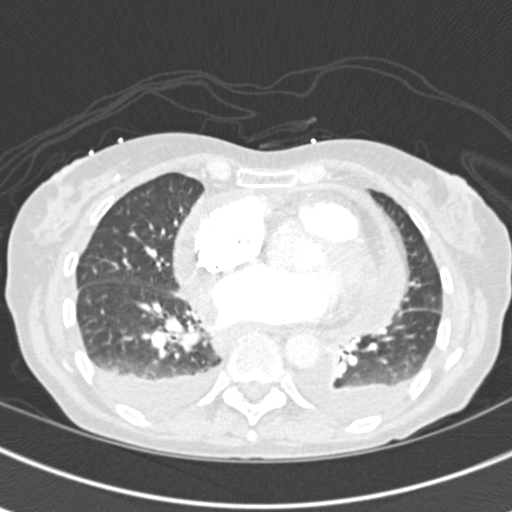
[im 151/290  soft-tissue]
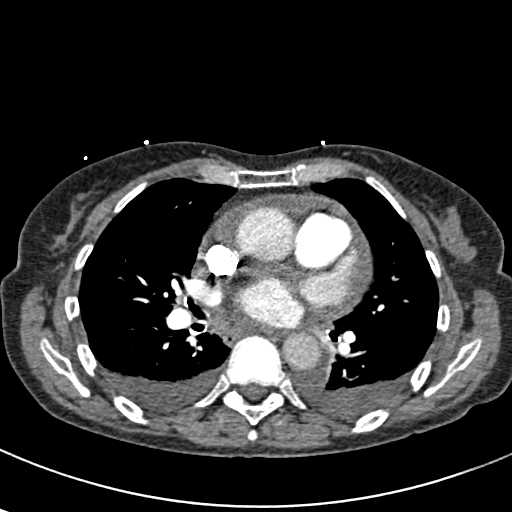
[im 164/290  lung]
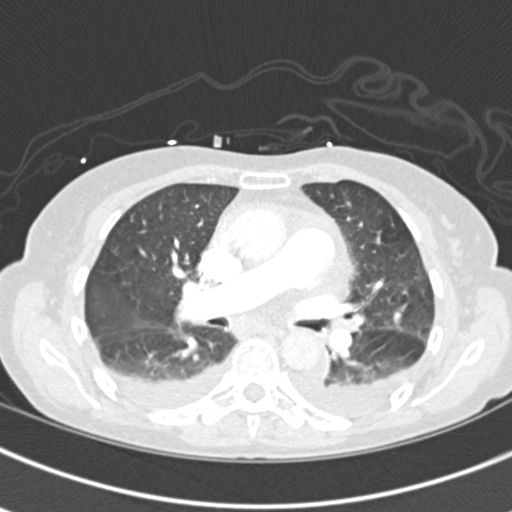
[im 176/290  soft-tissue]
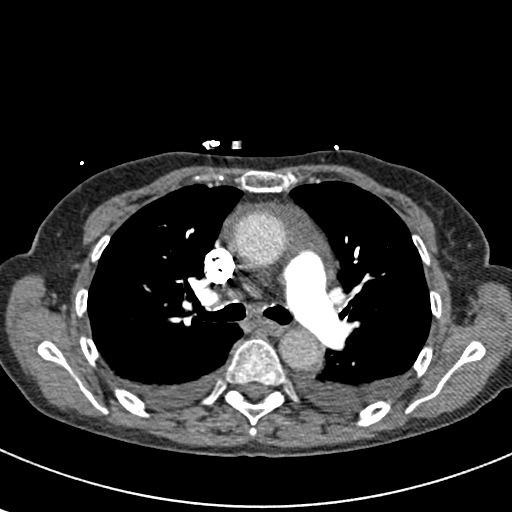
[im 202/290  lung]
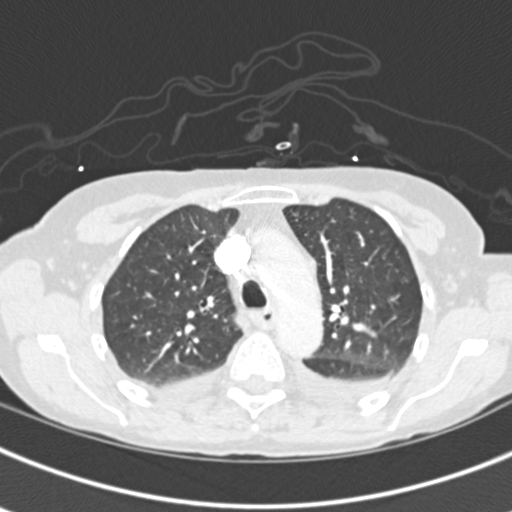
[im 214/290  soft-tissue]
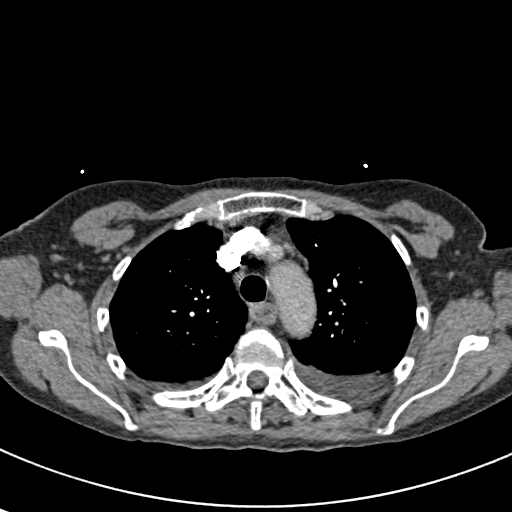
[im 239/290  lung]
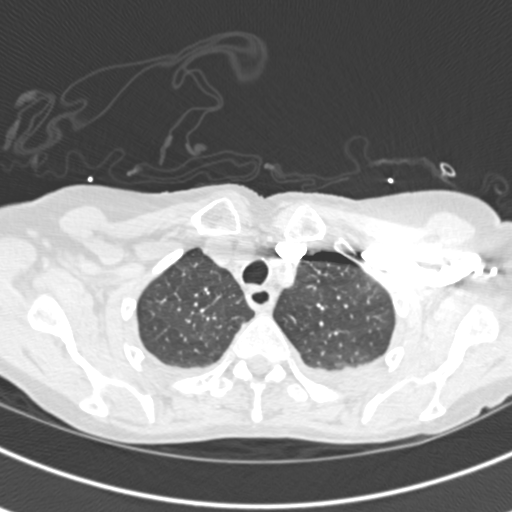
[im 252/290  soft-tissue]
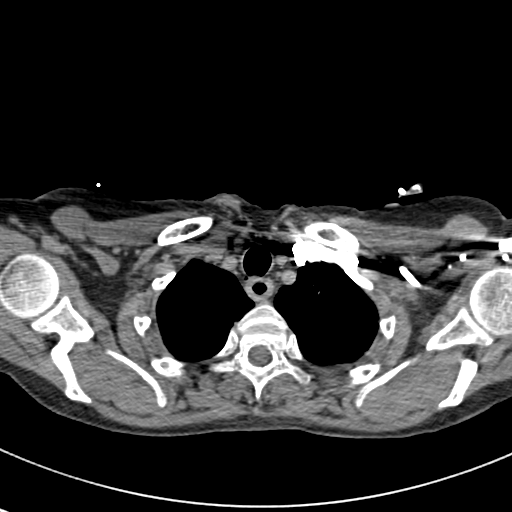
[im 277/290  lung]
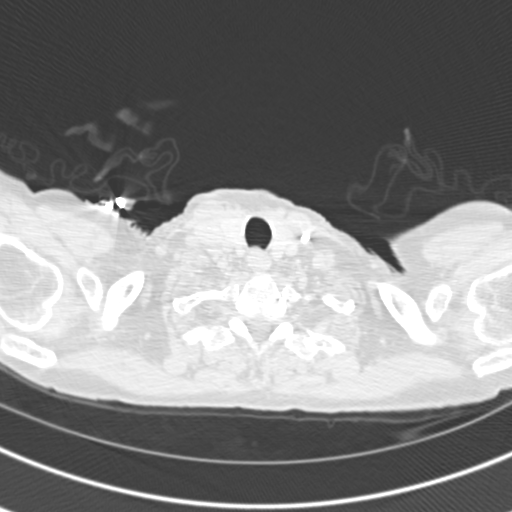

[Series 8: coronals · coronal · 0.59mm/px · 3 of 87 slices shown]
[im 22/87  soft-tissue]
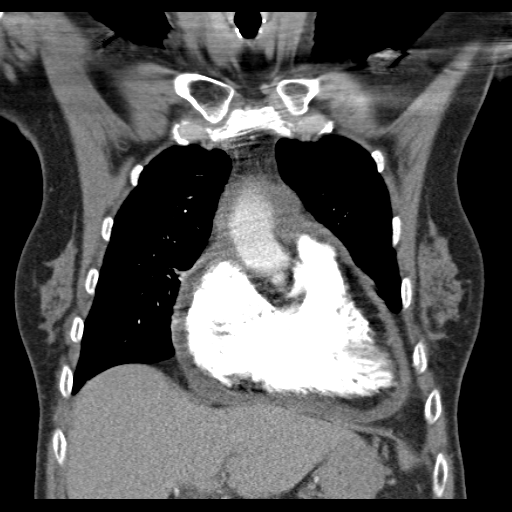
[im 44/87  soft-tissue]
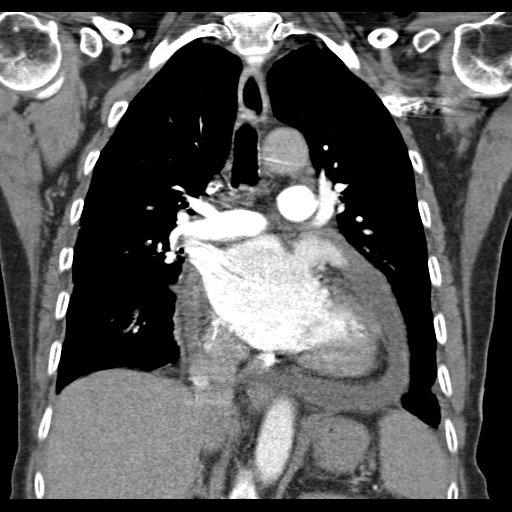
[im 65/87  soft-tissue]
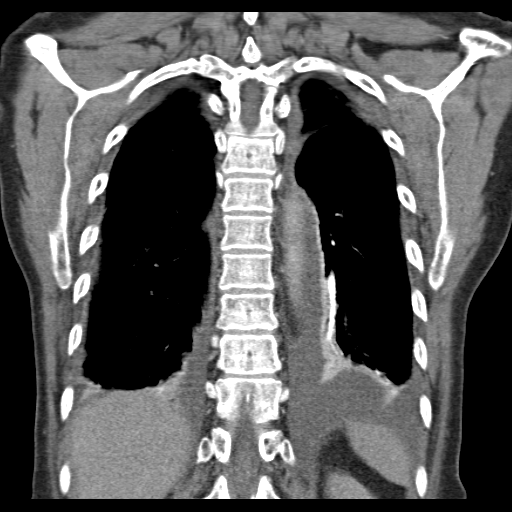

[18 of 46 positions shown; findings below may reference images not displayed]

Vascular Findings:

There is adequate opacification of the pulmonary arterial system of
the main pulmonary artery measuring 371 HU.  No discrete filling
defects are seen within the pulmonary arterial tree to suggest
pulmonary embolism.  Normal caliber of the main pulmonary artery.

Cardiomegaly.  Suspect a minimal increase in previously noted
pericardial effusion.  Normal caliber of the thoracic aorta.
Conventional configuration of the aortic arch.  No definite
periaortic stranding.

--------------------------------------------------------

Nonvascular findings:

Interval increase in now small to moderate-sized bilateral pleural
effusions.  Bibasilar dependent ground-glass opacity favored to
represent atelectasis.  No focal airspace opacities with air
bronchograms.  There is partial atelectasis/collapse of the medial
and posterior basilar segment of the left lower lobe which is
likely secondary to a combination of the adjacent pleural fluid and
cardiomegaly. Central pulmonary airways are patent.  No
pneumothorax.

Scattered shoddy mediastinal lymph nodes are not enlarged by CT
criteria.  Index precarinal lymph node measures 0.9 cm in diameter
but maintains a benign fatty hila (image 50 38, series four). No
definite hilar or axillary lymphadenopathy.

Limited early arterial phase evaluation of the upper abdomen is
normal. Interval resolution of previously noted subtle stranding
within the gastrohepatic ligament.

No acute or aggressive osseous abnormalities.
IMPRESSION: 1.  Negative for pulmonary embolism.

2.  Suspected minimal increase in pericardial effusion.  Again,
pericarditis is not excluded.
3.  Interval increase in now small to moderate-sized bilateral
pleural effusions.  No definite CT evidence of pulmonary edema.

## 2012-02-24 IMAGING — CR DG CHEST 2V
2 series · 2 of 2 positions shown · non-contrast
Comparison: Prior chest x-ray [DATE]

CLINICAL DATA: Preoperative radiographs

CHEST - 2 VIEW

[w chest pa]
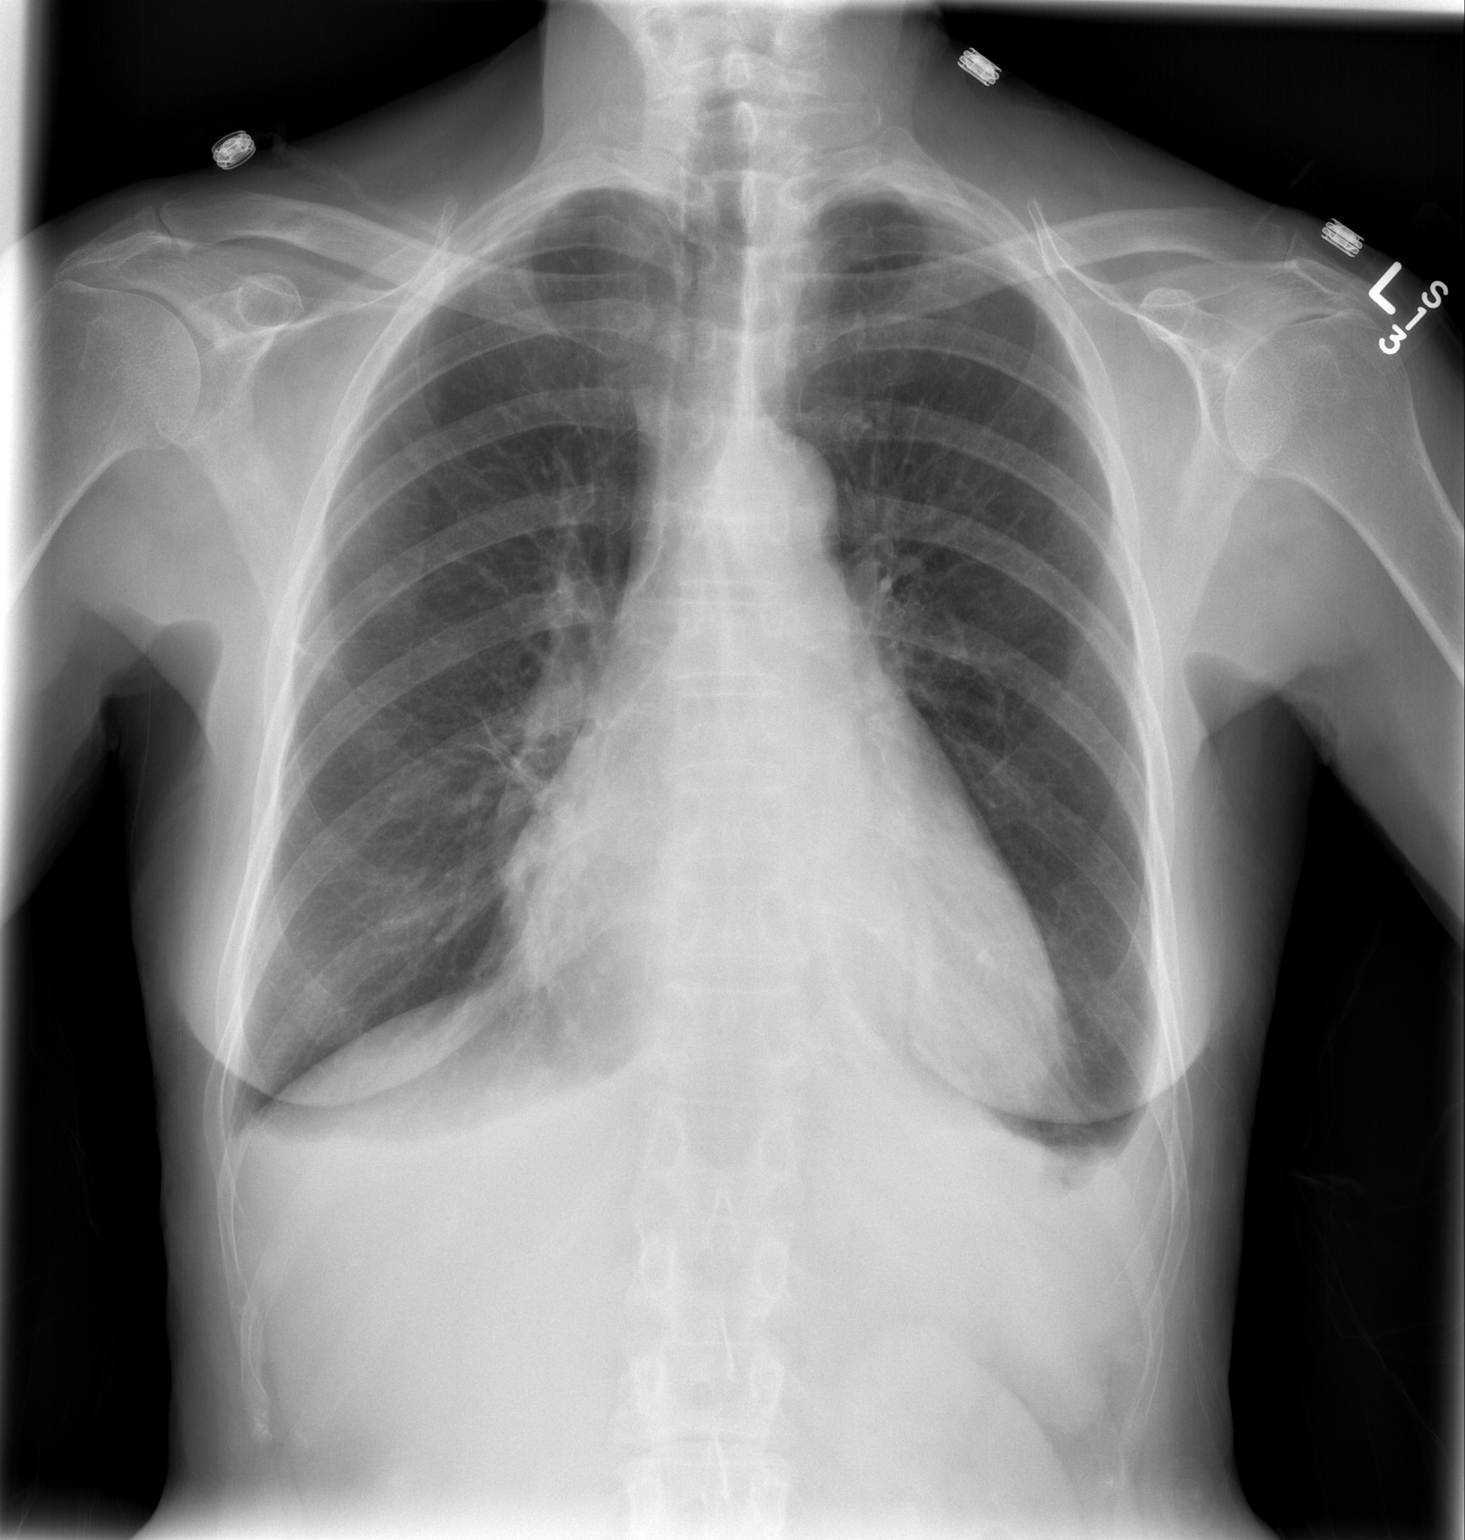

[w chest lat]
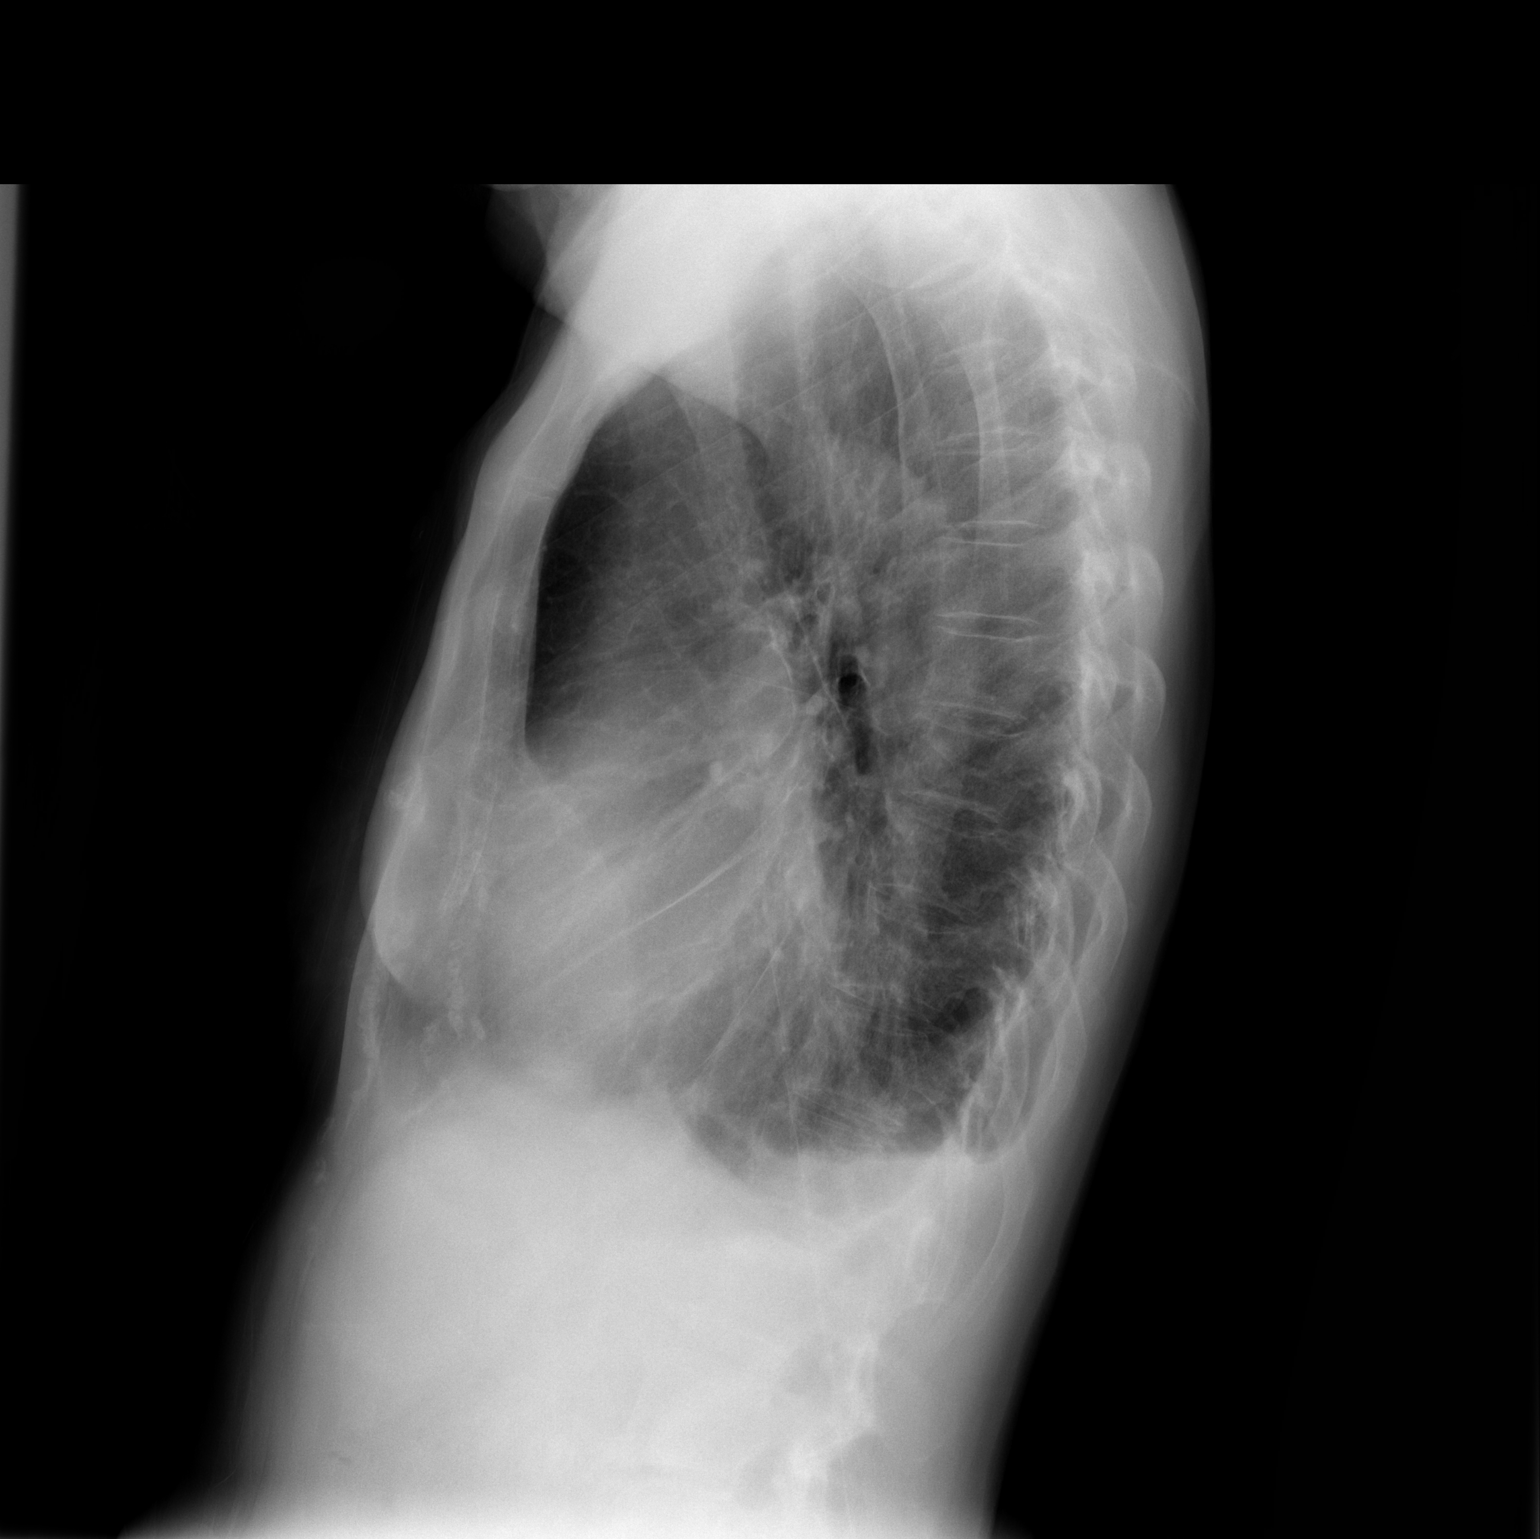

[2 of 2 positions shown; findings below may reference images not displayed]

FINDINGS: No significant interval change in the appearance of the
chest since the recent prior examination.  Persistent small
bilateral pleural effusions, background changes of COPD and
enlargement of the cardiopericardial silhouette.
IMPRESSION: 1.  No significant interval change since the recent prior chest x-
ray.
2.  Persistent enlargement the cardiopericardial silhouette in this
patient with a known pericardial effusion.
3.  Small bilateral effusions.

## 2012-02-24 MED ORDER — GUAIFENESIN 100 MG/5ML PO SOLN
5.0000 mL | ORAL | Status: DC | PRN
  Administered 2012-02-25: 100 mg via ORAL
  Filled 2012-02-24 (×2): qty 5

## 2012-02-24 MED ORDER — IOHEXOL 350 MG/ML SOLN
100.0000 mL | Freq: Once | INTRAVENOUS | Status: AC | PRN
  Administered 2012-02-24: 100 mL via INTRAVENOUS

## 2012-02-24 MED ORDER — DEXTROSE 5 % IV SOLN
1.5000 g | INTRAVENOUS | Status: AC
  Administered 2012-02-25: 1.5 g via INTRAVENOUS
  Filled 2012-02-24: qty 1.5

## 2012-02-24 MED ORDER — POTASSIUM CHLORIDE CRYS ER 20 MEQ PO TBCR
40.0000 meq | EXTENDED_RELEASE_TABLET | ORAL | Status: DC
  Administered 2012-02-24 (×2): 40 meq via ORAL
  Filled 2012-02-24 (×3): qty 2

## 2012-02-24 NOTE — Progress Notes (Addendum)
*  PRELIMINARY RESULTS* Vascular Ultrasound Lower extremity venous duplex has been completed.  Preliminary findings: Bilateral:  No evidence of DVT, superficial thrombosis, or Baker's Cyst.    Farrel Demark, RDMS, RVT Cindy Rigg 02/24/2012, 3:25 PM

## 2012-02-24 NOTE — Telephone Encounter (Signed)
Noted. Patient is currently admitted to [facility].

## 2012-02-24 NOTE — Progress Notes (Signed)
Patient Name: Selena Taylor      SUBJECTIVE: sob better  Still weak and tired  Pain better  Past Medical History  Diagnosis Date  . TIA (transient ischemic attack)   . PAF (paroxysmal atrial fibrillation)     has been on Flecainide in the past. Stopped 02/10/11; Remains on coumadin anticoagulation; intol to Rythmol and failed DCCV; noted to be in AFlutter 5/13;  Echo 01/2011:  EF 55-60%, mild MR, mild LAE.  Myoview 6/12:  No ischemia, EF 74%  . HTN (hypertension)   . Endometriosis   . Osteopenia   . Insomnia   . Chronic anticoagulation     on coumadin  . Anxiety   . Pericardial effusion   . Shortness of breath   . Pericardial effusion 02/23/2012  . CHF (congestive heart failure)     PHYSICAL EXAM Filed Vitals:   02/23/12 1602 02/23/12 1718 02/23/12 2107 02/24/12 0454  BP: 84/46 95/82 94/59  103/70  Pulse: 98 90 67 58  Temp: 97.6 F (36.4 C)  98 F (36.7 C) 98 F (36.7 C)  TempSrc: Oral  Oral Oral  Resp: 20  20 18   Height: 5\' 2"  (1.575 m)     Weight: 121 lb 12.8 oz (55.248 kg)   119 lb 1.6 oz (54.023 kg)  SpO2: 98%  94% 97%   Well developed and nourished in no acute distress HENT normal Neck supple with JVP-8-9Carotids brisk and full without bruits Clear Irregularly irregular rate and rhythm with controlled ventricular response, rub  Abd-soft with active BS without hepatomegaly No Clubbing cyanosis edema Skin-warm and dry A & Oriented  Grossly normal sensory and motor function  TELEMETRY: Reviewed telemetry pt in afib controlled VR:    Intake/Output Summary (Last 24 hours) at 02/24/12 0812 Last data filed at 02/24/12 0805  Gross per 24 hour  Intake    240 ml  Output    500 ml  Net   -260 ml    LABS: Basic Metabolic Panel:  Lab 02/24/12 1610 02/23/12 1315 02/22/12 0456 02/20/12 1209 02/20/12 0510 02/19/12 0530 02/18/12 1848  NA 130* 129* 130* 131* 129* 133* 131*  K 3.2* 3.4* 4.2 3.3* 3.0* 3.3* 3.8  CL 91* 91* 94* 88* 89* 93* 91*  CO2 24 24 25 30 28 29  29   GLUCOSE 114* 108* 119* 109* 119* 138* 145*  BUN 25* 21 20 19 19 16 15   CREATININE 0.87 0.90 0.85 0.74 0.77 0.80 0.88  CALCIUM 9.0 8.7 -- -- -- -- --  MG -- -- -- -- -- -- --  PHOS -- -- -- -- -- -- --   Cardiac Enzymes: No results found for this basename: CKTOTAL:3,CKMB:3,CKMBINDEX:3,TROPONINI:3 in the last 72 hours CBC:  Lab 02/24/12 0550 02/23/12 1315  WBC 11.1* 12.4*  NEUTROABS -- 9.2*  HGB 12.2 13.1  HCT 36.9 37.6  MCV 90.7 90.8  PLT 419* 433*   PROTIME:  Basename 02/24/12 0550 02/22/12 0456  LABPROT 14.8 15.6*  INR 1.18 1.27   Liver Function Tests:  Basename 02/23/12 1315  AST 37  ALT 38*  ALKPHOS 142*  BILITOT 0.8  PROT 7.2  ALBUMIN 2.5*   No results found for this basename: LIPASE:2,AMYLASE:2 in the last 72 hours BNP: BNP (last 3 results)  Basename 02/23/12 1316 02/05/12 1008  PROBNP 1067.0* 603.1*     ASSESSMENT AND PLAN:  Patient Active Hospital Problem List: Atrial fibrillation (11/03/2006)   Atypical chest pain (02/05/2012)-pleuritic with sob   Pericarditis with effusion (02/12/2012)  HR improved     Will check CT today to r/o PE; also venous dopplers  Spoke with Dr Karl Pock  With persistent large effusion he thinks appropriate to purse window which would then allow anticoagulation and cardioversion He will see and schedule for tomorrow- TEE at that time and anticipate DCCV on monday  Signed, Sherryl Manges MD  02/24/2012

## 2012-02-24 NOTE — Consult Note (Signed)
Subjective:    Selena Taylor is known to [facility].  She has a history of PAF/Flutter, Pericarditis, TIA, and Hypertension. We were originally consulted on 02/16/2012 for possible pericardial window.  At that time the patient had  presented to the ED with a complaint of shortness of breath and Atrial Fibrillation with RVR. The patient is usually well controlled with Cardizem and coumadin, however she developed increased LE edema, orthopnea, and chest pain.  During that hospitalization the patient was managed medically and was discharged home on 02/22/2012.  However, the patient presented the next day to the ED with a complaint of chest pain and shortness of breath that was worsened with inspiration and awoke patient from sleep in middle of night.  Evaluation in the ED revealed the patient to be in Atrial Fibrillation with RVR and CXR showed bilateral pleural effusions.  She was admitted to Cardiology service for further treatment.  Currently patient is comfortable.  She is requesting to eat stating she hasn't had anything to eat or drink since Midnight.            Objective:   BP 103/70  Pulse 58  Temp 98 F (36.7 C) (Oral)  Resp 18  Ht 5\' 2"  (1.575 m)  Wt 119 lb 1.6 oz (54.023 kg)  BMI 21.78 kg/m2  SpO2 97%  General:  alert, cooperative and no distress Lungs:  diminished breath sounds bibasilar Heart:  irregular rate and rhythm and friction rub heard  Abdomen: soft, non-tender; bowel sounds normal; no masses,  no organomegaly Extremities:  extremities normal, atraumatic, no cyanosis or edema Neurologic:  Alert and oriented x3. Gait normal. Reflexes and motor strength normal and symmetric. Cranial nerves 2-12 and sensation grossly intact.   A/P:  1. Pericardial Effusion- will plan for window in AM, please make patient NPO at midnight 2. Atrial Fibrillation with RVR Recurrent admissions for pericardial effusion, enlarged on ECHO I have recommended pericardial window and drainage of effusion in  am  The goals risks and alternatives of the planned surgical procedure subxiphoid pericardial window drainage of effusion  have been discussed with the patient in detail. The risks of the procedure including death, infection, stroke, myocardial infarction, bleeding, blood transfusion have all been discussed specifically.  I have quoted Selena Taylor a 1 % of perioperative mortality and a complication rate as high as 10 %. The patient's questions have been answered.Selena Taylor is willing  to proceed with the planned procedure.  I have seen and examined Selena Taylor and formulated the   above assessment  and plan.  Delight Ovens MD Beeper [phone] Office [phone] 02/24/2012 6:27 PM

## 2012-02-24 NOTE — Progress Notes (Signed)
Patient has active order for potassium 40 meq. to be given every 4 hours.  Dunn, PA notified of current potassium level of 4.1.  Potassium order discontinued per Dunn. RN will continue to monitor. Louretta Parma, RN

## 2012-02-24 NOTE — Progress Notes (Signed)
Thank you to Ivonne Andrew RN CM for this referral.  Patient evaluated for community based chronic disease management services with [facility] Care Management Program as a benefit of patient's [employer]. Marland Kitchen Spoke with patient at bedside to explain [facility] Care Management services.  Patient has accepted services, but would like to thoroughly review her consent documents before signing.  Left contact information and [facility] literature with patient.  Patient will receive a post discharge transition of care call and will be evaluated for monthly home visits for assessments and disease process education.  Will follow up with patient on tomorrow to obtain signed consents and further assesment.  Of note, [facility] Care Management services does not replace or interfere with any services that are arranged by inpatient case management or social work.  For additional questions or referrals please contact Anibal Henderson BSN RN [facility] Liaison at [phone].

## 2012-02-24 NOTE — Progress Notes (Signed)
Utilization Review Completed.   Michaelpaul Apo, RN, BSN Nurse Case Manager  [phone]  

## 2012-02-25 ENCOUNTER — Inpatient Hospital Stay (HOSPITAL_COMMUNITY): Payer: Medicare Other

## 2012-02-25 ENCOUNTER — Encounter (HOSPITAL_COMMUNITY): Payer: Self-pay | Admitting: Anesthesiology

## 2012-02-25 ENCOUNTER — Inpatient Hospital Stay (HOSPITAL_COMMUNITY): Payer: Medicare Other | Admitting: Anesthesiology

## 2012-02-25 ENCOUNTER — Encounter (HOSPITAL_COMMUNITY)
Admission: EM | Disposition: A | Discharge status: Home / Self Care | Payer: Self-pay | Source: Home / Self Care | Attending: Internal Medicine

## 2012-02-25 DIAGNOSIS — I309 Acute pericarditis, unspecified: Secondary | ICD-10-CM

## 2012-02-25 HISTORY — PX: PERICARDIAL WINDOW: SHX2213

## 2012-02-25 HISTORY — PX: TEE WITHOUT CARDIOVERSION: SHX5443

## 2012-02-25 LAB — BASIC METABOLIC PANEL
CO2: 24 mEq/L (ref 19–32)
Calcium: 8.5 mg/dL (ref 8.4–10.5)
Chloride: 96 mEq/L (ref 96–112)
Creatinine, Ser: 0.75 mg/dL (ref 0.50–1.10)
GFR calc Af Amer: >90 mL/min (ref >90)
GFR calc non Af Amer: 81 mL/min — ABNORMAL LOW (ref >90)
Glucose, Bld: 137 mg/dL — ABNORMAL HIGH (ref 70–99)
Potassium: 4.3 mEq/L (ref 3.5–5.1)
Sodium: 129 mEq/L — ABNORMAL LOW (ref 135–145)

## 2012-02-25 LAB — SURGICAL PCR SCREEN
MRSA, PCR: NEGATIVE
Staphylococcus aureus: NEGATIVE

## 2012-02-25 LAB — GLUCOSE, CAPILLARY: Glucose-Capillary: 105 mg/dL — ABNORMAL HIGH (ref 70–99)

## 2012-02-25 LAB — PATHOLOGIST SMEAR REVIEW

## 2012-02-25 LAB — BODY FLUID CELL COUNT WITH DIFFERENTIAL
Lymphs, Fluid: 89 %
Monocyte-Macrophage-Serous Fluid: 3 % — ABNORMAL LOW (ref 50–90)

## 2012-02-25 LAB — LACTATE DEHYDROGENASE, PLEURAL OR PERITONEAL FLUID: LD, Fluid: 971 U/L — ABNORMAL HIGH (ref 3–23)

## 2012-02-25 LAB — APTT: aPTT: 25 seconds (ref 24–37)

## 2012-02-25 LAB — PROTIME-INR: INR: 1.17 (ref 0.00–1.49)

## 2012-02-25 LAB — MAGNESIUM
Magnesium: 1.7 mg/dL (ref 1.5–2.5)
Magnesium: 1.9 mg/dL (ref 1.5–2.5)

## 2012-02-25 IMAGING — CR DG CHEST 1V PORT
1 series · 1 of 1 positions shown · non-contrast
Comparison: [DATE] and [DATE]

CLINICAL DATA: Persistent cough.  Pericardial effusion.

PORTABLE CHEST - 1 VIEW

[AP]
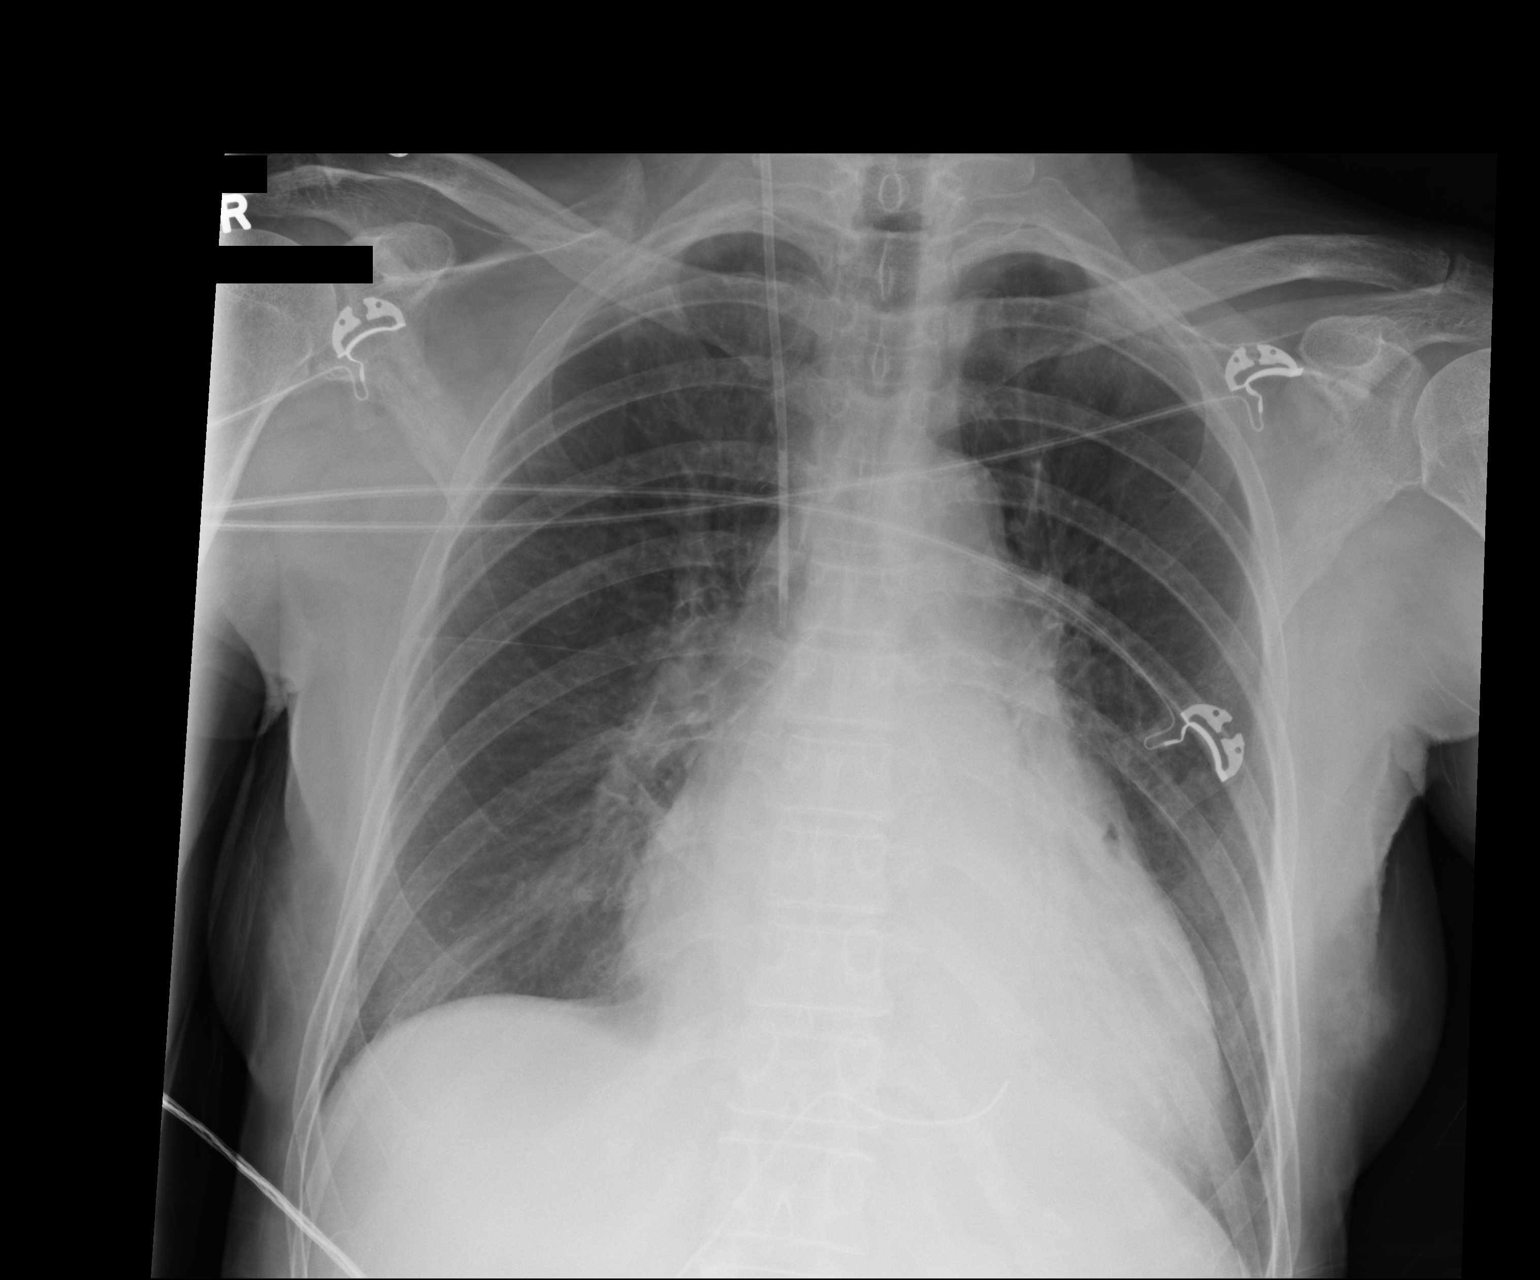

[1 of 1 positions shown; findings below may reference images not displayed]

FINDINGS: Central catheter tip is in the superior vena cava.
Pericardial drain in place.  Chronic cardiomegaly.  Small bilateral
effusions appear to have resolved.  There is focal atelectasis with
air bronchograms at the left lung base.  Lungs are otherwise clear.
Pulmonary vascularity is normal.
IMPRESSION: New focal atelectasis at the left lung base.

## 2012-02-25 SURGERY — CREATION, PERICARDIAL WINDOW
Anesthesia: General | Site: Chest | Wound class: Clean

## 2012-02-25 MED ORDER — BISACODYL 5 MG PO TBEC
10.0000 mg | DELAYED_RELEASE_TABLET | Freq: Every day | ORAL | Status: DC
  Administered 2012-02-25 – 2012-02-27 (×3): 10 mg via ORAL
  Filled 2012-02-25 (×3): qty 2

## 2012-02-25 MED ORDER — FENTANYL CITRATE 0.05 MG/ML IJ SOLN: 25.0000 ug | INTRAMUSCULAR | Status: DC | PRN

## 2012-02-25 MED ORDER — WARFARIN SODIUM 2.5 MG PO TABS
2.5000 mg | ORAL_TABLET | Freq: Once | ORAL | Status: AC
  Administered 2012-02-25: 2.5 mg via ORAL
  Filled 2012-02-25: qty 1

## 2012-02-25 MED ORDER — FENTANYL CITRATE 0.05 MG/ML IJ SOLN
INTRAMUSCULAR | Status: DC | PRN
  Administered 2012-02-25 (×2): 50 ug via INTRAVENOUS
  Administered 2012-02-25: 100 ug via INTRAVENOUS
  Administered 2012-02-25: 50 ug via INTRAVENOUS

## 2012-02-25 MED ORDER — NEOSTIGMINE METHYLSULFATE 1 MG/ML IJ SOLN
INTRAMUSCULAR | Status: DC | PRN
  Administered 2012-02-25: 3 mg via INTRAVENOUS

## 2012-02-25 MED ORDER — ONDANSETRON HCL 4 MG/2ML IJ SOLN: 4.0000 mg | Freq: Once | INTRAMUSCULAR | Status: DC | PRN

## 2012-02-25 MED ORDER — SENNOSIDES-DOCUSATE SODIUM 8.6-50 MG PO TABS
1.0000 | ORAL_TABLET | Freq: Every evening | ORAL | Status: DC | PRN
  Filled 2012-02-25: qty 1

## 2012-02-25 MED ORDER — POTASSIUM CHLORIDE 10 MEQ/50ML IV SOLN
10.0000 meq | Freq: Every day | INTRAVENOUS | Status: DC | PRN
  Filled 2012-02-25: qty 50

## 2012-02-25 MED ORDER — LACTATED RINGERS IV SOLN
INTRAVENOUS | Status: DC | PRN
  Administered 2012-02-25 (×2): via INTRAVENOUS

## 2012-02-25 MED ORDER — KCL IN DEXTROSE-NACL 20-5-0.45 MEQ/L-%-% IV SOLN
INTRAVENOUS | Status: DC
  Administered 2012-02-25 – 2012-02-27 (×3): via INTRAVENOUS
  Filled 2012-02-25 (×7): qty 1000

## 2012-02-25 MED ORDER — WARFARIN SODIUM 2.5 MG PO TABS: 2.5000 mg | ORAL_TABLET | Freq: Once | ORAL | Status: DC

## 2012-02-25 MED ORDER — SODIUM CHLORIDE 0.9 % IJ SOLN: 3.0000 mL | INTRAMUSCULAR | Status: DC | PRN

## 2012-02-25 MED ORDER — OXYCODONE HCL 5 MG PO TABS
5.0000 mg | ORAL_TABLET | ORAL | Status: AC | PRN
  Administered 2012-02-25 – 2012-02-26 (×4): 5 mg via ORAL
  Filled 2012-02-25 (×4): qty 1

## 2012-02-25 MED ORDER — PROPOFOL 10 MG/ML IV BOLUS
INTRAVENOUS | Status: DC | PRN
  Administered 2012-02-25: 100 mg via INTRAVENOUS

## 2012-02-25 MED ORDER — WARFARIN - PHARMACIST DOSING INPATIENT: Freq: Every day | Status: DC

## 2012-02-25 MED ORDER — HYDROMORPHONE HCL PF 1 MG/ML IJ SOLN
INTRAMUSCULAR | Status: AC
  Filled 2012-02-25: qty 1

## 2012-02-25 MED ORDER — CEFUROXIME SODIUM 1.5 G IJ SOLR
1.5000 g | Freq: Two times a day (BID) | INTRAMUSCULAR | Status: AC
  Administered 2012-02-25 – 2012-02-26 (×2): 1.5 g via INTRAVENOUS
  Filled 2012-02-25 (×4): qty 1.5

## 2012-02-25 MED ORDER — LIDOCAINE HCL 4 % MT SOLN
OROMUCOSAL | Status: DC | PRN
  Administered 2012-02-25: 4 mL via TOPICAL

## 2012-02-25 MED ORDER — GLYCOPYRROLATE 0.2 MG/ML IJ SOLN
INTRAMUSCULAR | Status: DC | PRN
  Administered 2012-02-25: 0.4 mg via INTRAVENOUS

## 2012-02-25 MED ORDER — OXYCODONE-ACETAMINOPHEN 5-325 MG PO TABS
1.0000 | ORAL_TABLET | ORAL | Status: DC | PRN
  Administered 2012-02-26: 2 via ORAL
  Administered 2012-02-26 – 2012-02-27 (×2): 1 via ORAL
  Filled 2012-02-25: qty 1
  Filled 2012-02-25: qty 2
  Filled 2012-02-25: qty 1

## 2012-02-25 MED ORDER — ROCURONIUM BROMIDE 100 MG/10ML IV SOLN
INTRAVENOUS | Status: DC | PRN
  Administered 2012-02-25: 30 mg via INTRAVENOUS

## 2012-02-25 MED ORDER — ACETAMINOPHEN 10 MG/ML IV SOLN
1000.0000 mg | Freq: Four times a day (QID) | INTRAVENOUS | Status: AC
  Administered 2012-02-25 – 2012-02-26 (×4): 1000 mg via INTRAVENOUS
  Filled 2012-02-25 (×5): qty 100

## 2012-02-25 MED ORDER — SODIUM CHLORIDE 0.9 % IJ SOLN
3.0000 mL | Freq: Two times a day (BID) | INTRAMUSCULAR | Status: DC
  Administered 2012-02-25 – 2012-02-26 (×2): 3 mL via INTRAVENOUS

## 2012-02-25 MED ORDER — ONDANSETRON HCL 4 MG/2ML IJ SOLN
4.0000 mg | Freq: Four times a day (QID) | INTRAMUSCULAR | Status: DC | PRN
  Administered 2012-02-27: 4 mg via INTRAVENOUS
  Filled 2012-02-25: qty 2

## 2012-02-25 MED ORDER — HEPARIN (PORCINE) IN NACL 100-0.45 UNIT/ML-% IJ SOLN
1000.0000 [IU]/h | INTRAMUSCULAR | Status: DC
  Administered 2012-02-26: 500 [IU]/h via INTRAVENOUS
  Administered 2012-02-27: 1000 [IU]/h via INTRAVENOUS
  Filled 2012-02-25 (×4): qty 250

## 2012-02-25 MED ORDER — ONDANSETRON HCL 4 MG/2ML IJ SOLN
INTRAMUSCULAR | Status: DC | PRN
  Administered 2012-02-25: 4 mg via INTRAVENOUS

## 2012-02-25 MED ORDER — LIDOCAINE HCL (CARDIAC) 20 MG/ML IV SOLN
INTRAVENOUS | Status: DC | PRN
  Administered 2012-02-25: 80 mg via INTRAVENOUS

## 2012-02-25 MED ORDER — HYDROMORPHONE HCL PF 1 MG/ML IJ SOLN
0.2500 mg | INTRAMUSCULAR | Status: DC | PRN
  Administered 2012-02-25: 0.5 mg via INTRAVENOUS

## 2012-02-25 MED ORDER — SODIUM CHLORIDE 0.9 % IV SOLN
250.0000 mL | INTRAVENOUS | Status: DC
  Administered 2012-02-26: 20 mL via INTRAVENOUS

## 2012-02-25 MED ORDER — TRAMADOL HCL 50 MG PO TABS
50.0000 mg | ORAL_TABLET | Freq: Four times a day (QID) | ORAL | Status: DC | PRN
  Administered 2012-02-25: 100 mg via ORAL
  Filled 2012-02-25: qty 2

## 2012-02-25 SURGICAL SUPPLY — 55 items
ADH SKN CLS APL DERMABOND .7 (GAUZE/BANDAGES/DRESSINGS) ×2
APL SKNCLS STERI-STRIP NONHPOA (GAUZE/BANDAGES/DRESSINGS) ×2
ATTRACTOMAT 16X20 MAGNETIC DRP (DRAPES) ×3 IMPLANT
BENZOIN TINCTURE PRP APPL 2/3 (GAUZE/BANDAGES/DRESSINGS) ×3 IMPLANT
CATH THORACIC 28FR (CATHETERS) IMPLANT
CATH THORACIC 28FR RT ANG (CATHETERS) IMPLANT
CATH THORACIC 36FR (CATHETERS) IMPLANT
CATH THORACIC 36FR RT ANG (CATHETERS) IMPLANT
CLIP TI MEDIUM 6 (CLIP) ×1 IMPLANT
CLIP TI WIDE RED SMALL 6 (CLIP) ×1 IMPLANT
CLOTH BEACON ORANGE TIMEOUT ST (SAFETY) ×3 IMPLANT
CONN ST 1/4X3/8  BEN (MISCELLANEOUS) ×1
CONN ST 1/4X3/8 BEN (MISCELLANEOUS) IMPLANT
CONT SPEC 4OZ CLIKSEAL STRL BL (MISCELLANEOUS) ×2 IMPLANT
COVER SURGICAL LIGHT HANDLE (MISCELLANEOUS) ×5 IMPLANT
DERMABOND ADVANCED (GAUZE/BANDAGES/DRESSINGS) ×1
DERMABOND ADVANCED .7 DNX12 (GAUZE/BANDAGES/DRESSINGS) IMPLANT
DRAIN CHANNEL 28F RND 3/8 FF (WOUND CARE) ×4 IMPLANT
DRAPE LAPAROSCOPIC ABDOMINAL (DRAPES) ×3 IMPLANT
ELECT BLADE 4.0 EZ CLEAN MEGAD (MISCELLANEOUS) ×3
ELECT REM PT RETURN 9FT ADLT (ELECTROSURGICAL) ×3
ELECTRODE BLDE 4.0 EZ CLN MEGD (MISCELLANEOUS) IMPLANT
ELECTRODE REM PT RTRN 9FT ADLT (ELECTROSURGICAL) ×2 IMPLANT
GLOVE BIO SURGEON STRL SZ 6 (GLOVE) ×1 IMPLANT
GLOVE BIO SURGEON STRL SZ 6.5 (GLOVE) ×7 IMPLANT
GLOVE BIOGEL PI IND STRL 7.0 (GLOVE) IMPLANT
GLOVE BIOGEL PI INDICATOR 7.0 (GLOVE) ×4
GOWN BRE IMP SLV AUR LG STRL (GOWN DISPOSABLE) ×3 IMPLANT
HEMOSTAT POWDER SURGIFOAM 1G (HEMOSTASIS) IMPLANT
KIT BASIN OR (CUSTOM PROCEDURE TRAY) ×3 IMPLANT
KIT ROOM TURNOVER OR (KITS) ×3 IMPLANT
NS IRRIG 1000ML POUR BTL (IV SOLUTION) ×3 IMPLANT
PACK CHEST (CUSTOM PROCEDURE TRAY) ×3 IMPLANT
PAD ARMBOARD 7.5X6 YLW CONV (MISCELLANEOUS) ×6 IMPLANT
PAD ELECT DEFIB RADIOL ZOLL (MISCELLANEOUS) ×3 IMPLANT
SPONGE GAUZE 4X4 12PLY (GAUZE/BANDAGES/DRESSINGS) ×1 IMPLANT
STRIP CLOSURE SKIN 1/2X4 (GAUZE/BANDAGES/DRESSINGS) ×2 IMPLANT
SUT SILK  1 MH (SUTURE) ×1
SUT SILK 1 MH (SUTURE) IMPLANT
SUT VIC AB 1 CTX 18 (SUTURE) ×1 IMPLANT
SUT VIC AB 1 CTX 36 (SUTURE)
SUT VIC AB 1 CTX36XBRD ANBCTR (SUTURE) ×2 IMPLANT
SUT VIC AB 2-0 CTX 27 (SUTURE) ×1 IMPLANT
SUT VIC AB 3-0 X1 27 (SUTURE) ×3 IMPLANT
SWAB COLLECTION DEVICE MRSA (MISCELLANEOUS) IMPLANT
SYR 50ML SLIP (SYRINGE) IMPLANT
SYRINGE 10CC LL (SYRINGE) IMPLANT
SYSTEM SAHARA CHEST DRAIN ATS (WOUND CARE) IMPLANT
TAPE CLOTH SURG 4X10 WHT LF (GAUZE/BANDAGES/DRESSINGS) ×1 IMPLANT
TOWEL OR 17X24 6PK STRL BLUE (TOWEL DISPOSABLE) ×3 IMPLANT
TOWEL OR 17X26 10 PK STRL BLUE (TOWEL DISPOSABLE) ×4 IMPLANT
TRAP SPECIMEN MUCOUS 40CC (MISCELLANEOUS) ×2 IMPLANT
TRAY FOLEY CATH 14FRSI W/METER (CATHETERS) ×3 IMPLANT
TUBE ANAEROBIC SPECIMEN COL (MISCELLANEOUS) IMPLANT
WATER STERILE IRR 1000ML POUR (IV SOLUTION) ×6 IMPLANT

## 2012-02-25 NOTE — Anesthesia Postprocedure Evaluation (Signed)
  Anesthesia Post-op Note  Patient: Selena Taylor  Procedure(s) Performed: Procedure(s) (LRB) with comments: PERICARDIAL WINDOW (N/A) TRANSESOPHAGEAL ECHOCARDIOGRAM (TEE) (N/A)  Patient Location: PACU  Anesthesia Type:General  Level of Consciousness: awake, alert , oriented and patient cooperative  Airway and Oxygen Therapy: Patient Spontanous Breathing  Post-op Pain: mild  Post-op Assessment: Post-op Vital signs reviewed, Patient's Cardiovascular Status Stable, Respiratory Function Stable, Patent Airway, No signs of Nausea or vomiting and Pain level controlled  Post-op Vital Signs: stable  Complications: No apparent anesthesia complications

## 2012-02-25 NOTE — Progress Notes (Signed)
In at bedside to follow up with patient progress.  Patient is very weak and not up to discussing services at this time.  Made Selena Arenas RN CM aware of consult follow up.  Will continue to monitor disposition.

## 2012-02-25 NOTE — Anesthesia Preprocedure Evaluation (Signed)
Anesthesia Evaluation  Patient identified by MRN, date of birth, ID band Patient awake    Reviewed: Allergy & Precautions, H&P , NPO status , Patient's Chart, lab work & pertinent test results  Airway Mallampati: I TM Distance: >3 FB Neck ROM: full    Dental   Pulmonary shortness of breath,          Cardiovascular hypertension, +CHF + dysrhythmias Atrial Fibrillation Rhythm:irregular Rate:Tachycardia     Neuro/Psych TIA   GI/Hepatic   Endo/Other    Renal/GU      Musculoskeletal   Abdominal   Peds  Hematology   Anesthesia Other Findings   Reproductive/Obstetrics                           Anesthesia Physical Anesthesia Plan  ASA: III  Anesthesia Plan: General   Post-op Pain Management:    Induction: Intravenous  Airway Management Planned: Oral ETT  Additional Equipment: Arterial line and CVP  Intra-op Plan:   Post-operative Plan: Possible Post-op intubation/ventilation  Informed Consent: I have reviewed the patients History and Physical, chart, labs and discussed the procedure including the risks, benefits and alternatives for the proposed anesthesia with the patient or authorized representative who has indicated his/her understanding and acceptance.     Plan Discussed with: CRNA, Anesthesiologist and Surgeon  Anesthesia Plan Comments:         Anesthesia Quick Evaluation

## 2012-02-25 NOTE — Anesthesia Procedure Notes (Signed)
Procedure Name: Intubation Date/Time: 02/25/2012 7:31 AM Performed by: Margaree Mackintosh Pre-anesthesia Checklist: Patient identified, Timeout performed, Emergency Drugs available, Suction available and Patient being monitored Patient Re-evaluated:Patient Re-evaluated prior to inductionOxygen Delivery Method: Circle system utilized Preoxygenation: Pre-oxygenation with 100% oxygen Intubation Type: IV induction Ventilation: Mask ventilation without difficulty Laryngoscope Size: Mac and 3 Grade View: Grade I Tube type: Oral Tube size: 8.0 mm Number of attempts: 1 Airway Equipment and Method: Stylet and LTA kit utilized Placement Confirmation: ETT inserted through vocal cords under direct vision,  positive ETCO2 and breath sounds checked- equal and bilateral Secured at: 21 cm Tube secured with: Tape Dental Injury: Teeth and Oropharynx as per pre-operative assessment

## 2012-02-25 NOTE — Preoperative (Signed)
Beta Blockers   Reason not to administer Beta Blockers:Not Applicable 

## 2012-02-25 NOTE — Progress Notes (Addendum)
Subjective:  The patient is doing well following her subxiphoid pericardial window drainage of pericardial effusion with diffuse pericarditis. Cytologic exam of fluid was benign. No significant chest discomfort.  No dyspnea.  Rhythm atrial fib with controlled ventricular response 90-100.  Very little drainage from pericardial drain.  Objective:  Vital Signs in the last 24 hours: Temp:  [97 F (36.1 C)-97.9 F (36.6 C)] 97.5 F (36.4 C) (01/31 1230) Pulse Rate:  [74-195] 99  (01/31 1500) Resp:  [15-27] 20  (01/31 1500) BP: (112-136)/(60-84) 119/73 mmHg (01/31 1400) SpO2:  [90 %-100 %] 97 % (01/31 1500) Arterial Line BP: (114-160)/(64-90) 114/67 mmHg (01/31 1500) FiO2 (%):  [28 %] 28 % (01/31 0407) Weight:  [119 lb 14.4 oz (54.386 kg)] 119 lb 14.4 oz (54.386 kg) (01/31 0548)  Intake/Output from previous day: 01/30 0701 - 01/31 0700 In: 240 [P.O.:240] Out: 650 [Urine:650] Intake/Output from this shift: Total I/O In: 1736.7 [P.O.:60; I.V.:1576.7; IV Piggyback:100] Out: 360 [Urine:320; Blood:10; Chest Tube:30]     . acetaminophen  1,000 mg Intravenous Q6H  . amiodarone  400 mg Oral BID  . aspirin EC  81 mg Oral Daily  . bisacodyl  10 mg Oral Daily  . cefUROXime (ZINACEF)  IV  1.5 g Intravenous Q12H  . colchicine  0.6 mg Oral Daily  . diltiazem  240 mg Oral Daily  . HYDROmorphone      . pantoprazole  40 mg Oral Daily      . dextrose 5 % and 0.45 % NaCl with KCl 20 mEq/L 100 mL/hr at 02/25/12 1314    Physical Exam: The patient appears to be in no distress.  Head and neck exam reveals that the pupils are equal and reactive.  The extraocular movements are full.  There is no scleral icterus.  Mouth and pharynx are benign.  No lymphadenopathy.  No carotid bruits.  The jugular venous pressure is normal.  Thyroid is not enlarged or tender.  Chest is clear to percussion and auscultation.  No rales or rhonchi.  Expansion of the chest is symmetrical.  Heart reveals no abnormal  lift or heave.  First and second heart sounds are normal.  There is no murmur gallop rub or click.  The abdomen is soft and nontender.  Bowel sounds are normoactive.  There is no hepatosplenomegaly or mass.  There are no abdominal bruits.  Extremities reveal no phlebitis or edema.  Pedal pulses are good.  There is no cyanosis or clubbing.  Neurologic exam is normal strength and no lateralizing weakness.  No sensory deficits.  Integument reveals no rash  Lab Results:  Basename 02/24/12 1841 02/24/12 0550  WBC 11.0* 11.1*  HGB 11.8* 12.2  PLT 432* 419*    Basename 02/25/12 0800 02/24/12 1841  NA 131* 131*  K 4.2 4.1  CL 98 95*  CO2 23 24  GLUCOSE 131* 122*  BUN 18 21  CREATININE 0.75 0.74   No results found for this basename: TROPONINI:2,CK,MB:2 in the last 72 hours Hepatic Function Panel  Basename 02/24/12 1841  PROT 7.0  ALBUMIN 2.3*  AST 31  ALT 37*  ALKPHOS 150*  BILITOT 0.4  BILIDIR --  IBILI --   No results found for this basename: CHOL in the last 72 hours No results found for this basename: PROTIME in the last 72 hours  Imaging: Dg Chest 2 View  02/24/2012  *RADIOLOGY REPORT*  Clinical Data: Preoperative radiographs  CHEST - 2 VIEW  Comparison: Prior chest x-ray 02/23/2012  Findings: No significant interval change in the appearance of the chest since the recent prior examination.  Persistent small bilateral pleural effusions, background changes of COPD and enlargement of the cardiopericardial silhouette.  IMPRESSION: 1.  No significant interval change since the recent prior chest x- ray. 2.  Persistent enlargement the cardiopericardial silhouette in this patient with a known pericardial effusion. 3.  Small bilateral effusions.   Original Report Authenticated By: Malachy Moan, M.D.    Ct Angio Chest Pe W/cm &/or Wo Cm  02/24/2012  *RADIOLOGY REPORT*  Clinical Data: Shortness of breath, lower extremity edema, history of pericarditis, evaluate for pulmonary  embolism  CT ANGIOGRAPHY CHEST  Technique:  Multidetector CT imaging of the chest using the standard protocol during bolus administration of intravenous contrast. Multiplanar reconstructed images including MIPs were obtained and reviewed to evaluate the vascular anatomy.  Contrast: OMNIPAQUE IOHEXOL 350 MG/ML SOLN  Comparison: Chest CT - 02/05/2012  Vascular Findings:  There is adequate opacification of the pulmonary arterial system of the main pulmonary artery measuring 371 HU.  No discrete filling defects are seen within the pulmonary arterial tree to suggest pulmonary embolism.  Normal caliber of the main pulmonary artery.  Cardiomegaly.  Suspect a minimal increase in previously noted pericardial effusion.  Normal caliber of the thoracic aorta. Conventional configuration of the aortic arch.  No definite periaortic stranding.  --------------------------------------------------------  Nonvascular findings:  Interval increase in now small to moderate-sized bilateral pleural effusions.  Bibasilar dependent ground-glass opacity favored to represent atelectasis.  No focal airspace opacities with air bronchograms.  There is partial atelectasis/collapse of the medial and posterior basilar segment of the left lower lobe which is likely secondary to a combination of the adjacent pleural fluid and cardiomegaly. Central pulmonary airways are patent.  No pneumothorax.  Scattered shoddy mediastinal lymph nodes are not enlarged by CT criteria.  Index precarinal lymph node measures 0.9 cm in diameter but maintains a benign fatty hila (image [phone], series four). No definite hilar or axillary lymphadenopathy.  Limited early arterial phase evaluation of the upper abdomen is normal. Interval resolution of previously noted subtle stranding within the gastrohepatic ligament.  No acute or aggressive osseous abnormalities.  IMPRESSION:  1.  Negative for pulmonary embolism.  2.  Suspected minimal increase in pericardial effusion.   Again, pericarditis is not excluded. 3.  Interval increase in now small to moderate-sized bilateral pleural effusions.  No definite CT evidence of pulmonary edema.   Original Report Authenticated By: Tacey Ruiz, MD    Dg Chest Portable 1 View  02/25/2012  *RADIOLOGY REPORT*  Clinical Data: Persistent cough.  Pericardial effusion.  PORTABLE CHEST - 1 VIEW  Comparison: 02/24/2012 and 02/23/2012  Findings: Central catheter tip is in the superior vena cava. Pericardial drain in place.  Chronic cardiomegaly.  Small bilateral effusions appear to have resolved.  There is focal atelectasis with air bronchograms at the left lung base.  Lungs are otherwise clear. Pulmonary vascularity is normal.  IMPRESSION: New focal atelectasis at the left lung base.   Original Report Authenticated By: Francene Boyers, M.D.     Cardiac Studies: Telemetry atrial fibrillation with controlled ventricular response. Assessment/Plan:    Atrial fibrillation (11/03/2006)   Assessment: Rate controlled on diltiazem and amiodarone   Plan:  Begin warfarin tonight. Plan for DCCV Monday per Dr. Graciela Husbands. Orders placed.           We are investigating whether she could afford xarelto. Case manager expects to have an answer  tomorrow.  If affordable she can be switched over the weekend to xarelto and then go home on xarelto.          Will restart IV heparin 0600 tomorrow morning. Discussed with Dr. Laneta Simmers.   Pericarditis with effusion (02/12/2012)   Assessment: Doing well post subxiphoid pericardial window. Small amount of sanguinous drainage from her diffuse pericarditis.   Plan: Continue colchicine.    LOS: 2 days    Cassell Clement 02/25/2012, 5:05 PM

## 2012-02-25 NOTE — Transfer of Care (Signed)
Immediate Anesthesia Transfer of Care Note  Patient: Selena Taylor  Procedure(s) Performed: Procedure(s) (LRB) with comments: PERICARDIAL WINDOW (N/A) TRANSESOPHAGEAL ECHOCARDIOGRAM (TEE) (N/A)  Patient Location: PACU  Anesthesia Type:General  Level of Consciousness: awake, alert  and oriented  Airway & Oxygen Therapy: Patient Spontanous Breathing and Patient connected to nasal cannula oxygen  Post-op Assessment: Report given to PACU RN and Post -op Vital signs reviewed and stable  Post vital signs: Reviewed and stable  Complications: No apparent anesthesia complications

## 2012-02-25 NOTE — Brief Op Note (Addendum)
02/23/2012 - 02/25/2012  8:21 AM  PATIENT:  Selena Taylor  77 y.o. female  PRE-OPERATIVE DIAGNOSIS:  Pericardial effusion  POST-OPERATIVE DIAGNOSIS:  Pericardial effusion with diffuse pericarditis   PROCEDURE:   SUBXIPHOID PERICARDIAL WINDOW drainage of pericardial fluid and pericardial bx   SURGEON:  Surgeon(s): Delight Ovens, MD  ASSISTANT: Coral Ceo, PA-C  ANESTHESIA:   general  PATIENT CONDITION:  PACU - hemodynamically stable.  FINDINGS: ~ 100 ml serosanguinous fluid drained, significant pericardial inflammation

## 2012-02-25 NOTE — Progress Notes (Signed)
Patient ID: Selena Taylor, female   DOB: 06-18-1935, 77 y.o.   MRN: [id number]   SICU Evening Rounds:  Hemodynamically stable  Minimal chest tube output.  Discussed with Dr. Patty Sermons. She will require anticoagulation for A-fib and DCCV. I think coumadin can be started tonight and heparin in am with no bolus.

## 2012-02-25 NOTE — Progress Notes (Signed)
ANTICOAGULATION CONSULT NOTE - Initial Consult  Pharmacy Consult for Warfarin (tonight), Heparin (AM - no bolus) Indication: atrial fibrillation  Allergies  Allergen Reactions  . Other Other (See Comments)    Novocaine.  REACTION:  Rapid heart rate  . Procaine Hcl     Rapid heart rate    Patient Measurements: Height: 5\' 2"  (157.5 cm) Weight: 119 lb 14.4 oz (54.386 kg) (c scale) IBW/kg (Calculated) : 50.1  Dosing weight:  54 kg  Vital Signs: Temp: 97.5 F (36.4 C) (01/31 1230) Temp src: Oral (01/31 1230) BP: 127/63 mmHg (01/31 1700) Pulse Rate: 90  (01/31 1700)  Labs:  Basename 02/25/12 0800 02/24/12 1841 02/24/12 0550 02/23/12 1315  HGB -- 11.8* 12.2 --  HCT -- 34.4* 36.9 37.6  PLT -- 432* 419* 433*  APTT -- 31 -- --  LABPROT -- 15.4* 14.8 --  INR -- 1.24 1.18 --  HEPARINUNFRC -- -- -- --  CREATININE 0.75 0.74 0.87 --  CKTOTAL -- -- -- --  CKMB -- -- -- --  TROPONINI -- -- -- --    Estimated Creatinine Clearance: 48.8 ml/min (by C-G formula based on Cr of 0.75).   Medical History: Past Medical History  Diagnosis Date  . TIA (transient ischemic attack)   . PAF (paroxysmal atrial fibrillation)     has been on Flecainide in the past. Stopped 02/10/11; Remains on coumadin anticoagulation; intol to Rythmol and failed DCCV; noted to be in AFlutter 5/13;  Echo 01/2011:  EF 55-60%, mild MR, mild LAE.  Myoview 6/12:  No ischemia, EF 74%  . HTN (hypertension)   . Endometriosis   . Osteopenia   . Insomnia   . Chronic anticoagulation     on coumadin  . Anxiety   . Pericardial effusion   . Shortness of breath   . Pericardial effusion 02/23/2012  . CHF (congestive heart failure)    Assessment: 77 yo female admitted on 1/29 with hx. of chronic A.Fib.  She has been on Warfarin but this has been held since last week.  She has had a pericardial window, now with decreased pericardial drainage.  We have been asked to resume her Warfarin therapy and add IV heparin in the  morning until she has a therapeutic INR.  Her INR yesterday was at baseline 15.4sec/1.24.  Her H/H is stable and no noted bleeding complications.  HOME dose:  Warfarin 2.5 mg daily  Drug/Drug interactions:  She is on Amiodarone which can potentiate the effects of her INR.    Goal of Therapy:  Heparin level 0.3-0.7 INR 2-3 Monitor platelets by anticoagulation protocol: Yes   Plan:   Will give Warfarin 2.5mg  po x 1 tonight - start at home regimen but may need to decrease with Amiodarone on board.  Start IV heparin in the morning once lab results are reviewed at 500 units/hr.  Monitor for bleeding complications  Check daily heparin level and CBC  Nadara Mustard, PharmD., MS Clinical Pharmacist Pager:  [phone] Thank you for allowing pharmacy to be part of this patients care team. 02/25/2012,7:09 PM

## 2012-02-25 NOTE — Progress Notes (Signed)
Doing well following pericardial window.  Plan is for DCCV Monday. Discussed with Dr. Graciela Husbands. Can consider using Xarelto over weekend rather than warfarin if she could afford outpatient xarelto. Will ask case manager to check on what her costs would be for xarelto.

## 2012-02-25 NOTE — Progress Notes (Signed)
  Echocardiogram Echocardiogram Transesophageal has been performed.  Selena Taylor 02/25/2012, 7:59 AM

## 2012-02-25 NOTE — Progress Notes (Signed)
Waiting on 2300 RN to call back for report.

## 2012-02-26 DIAGNOSIS — I517 Cardiomegaly: Secondary | ICD-10-CM

## 2012-02-26 LAB — PROTIME-INR
INR: 1.21 (ref 0.00–1.49)
Prothrombin Time: 15.1 seconds (ref 11.6–15.2)

## 2012-02-26 LAB — BASIC METABOLIC PANEL
BUN: 12 mg/dL (ref 6–23)
CO2: 25 mEq/L (ref 19–32)
Chloride: 100 mEq/L (ref 96–112)
Creatinine, Ser: 0.78 mg/dL (ref 0.50–1.10)

## 2012-02-26 LAB — BLOOD GAS, ARTERIAL
Bicarbonate: 24.1 mEq/L — ABNORMAL HIGH (ref 20.0–24.0)
FIO2: 0.21 %
O2 Saturation: 94.6 %
Patient temperature: 98.6

## 2012-02-26 LAB — CBC
Hemoglobin: 11 g/dL — ABNORMAL LOW (ref 12.0–15.0)
MCH: 31.5 pg (ref 26.0–34.0)
MCHC: 33.8 g/dL (ref 30.0–36.0)
RDW: 13.6 % (ref 11.5–15.5)

## 2012-02-26 MED ORDER — ZOLPIDEM TARTRATE 5 MG PO TABS: 10.0000 mg | ORAL_TABLET | Freq: Every evening | ORAL | Status: DC | PRN

## 2012-02-26 MED ORDER — WARFARIN SODIUM 2.5 MG PO TABS
2.5000 mg | ORAL_TABLET | Freq: Once | ORAL | Status: AC
  Administered 2012-02-26: 2.5 mg via ORAL
  Filled 2012-02-26: qty 1

## 2012-02-26 MED ORDER — ZOLPIDEM TARTRATE 5 MG PO TABS
10.0000 mg | ORAL_TABLET | Freq: Every evening | ORAL | Status: DC | PRN
  Administered 2012-02-26 – 2012-02-28 (×3): 10 mg via ORAL
  Filled 2012-02-26 (×2): qty 1
  Filled 2012-02-26 (×2): qty 2

## 2012-02-26 MED ORDER — ZOLPIDEM TARTRATE 5 MG PO TABS: 5.0000 mg | ORAL_TABLET | Freq: Every evening | ORAL | Status: DC | PRN

## 2012-02-26 NOTE — Progress Notes (Signed)
  Echocardiogram 2D Echocardiogram has been performed.  Selena Taylor 02/26/2012, 5:33 PM

## 2012-02-26 NOTE — Progress Notes (Signed)
1 Day Post-Op Procedure(s) (LRB): PERICARDIAL WINDOW (N/A) TRANSESOPHAGEAL ECHOCARDIOGRAM (TEE) (N/A) Subjective: No complaints  Objective: Vital signs in last 24 hours: Temp:  [97.3 F (36.3 C)-98 F (36.7 C)] 97.5 F (36.4 C) (02/01 0800) Pulse Rate:  [78-117] 78  (02/01 1100) Cardiac Rhythm:  [-] Atrial fibrillation (02/01 1000) Resp:  [16-29] 22  (02/01 1100) BP: (91-140)/(55-98) 140/84 mmHg (02/01 1100) SpO2:  [90 %-99 %] 98 % (02/01 1100) Arterial Line BP: (83-138)/(60-82) 114/65 mmHg (02/01 1000) Weight:  [57.607 kg (127 lb)] 57.607 kg (127 lb) (02/01 0500)  Hemodynamic parameters for last 24 hours:    Intake/Output from previous day: 01/31 0701 - 02/01 0700 In: 3806.7 [P.O.:180; I.V.:3176.7; IV Piggyback:450] Out: 1720 [Urine:1620; Blood:10; Chest Tube:90] Intake/Output this shift: Total I/O In: 475 [I.V.:425; IV Piggyback:50] Out: 100 [Urine:100]  minimal serosanguinous chest tube output.  Lab Results:  [facility] 02/26/12 0430 02/24/12 1841  WBC 8.8 11.0*  HGB 11.0* 11.8*  HCT 32.5* 34.4*  PLT 429* 432*   BMET:  Basename 02/26/12 0430 02/25/12 1800  NA 132* 129*  K 4.4 4.3  CL 100 96  CO2 25 24  GLUCOSE 128* 137*  BUN 12 15  CREATININE 0.78 0.77  CALCIUM 8.3* 8.3*    PT/INR:  Basename 02/26/12 0430  LABPROT 15.1  INR 1.21   ABG    Component Value Date/Time   PHART 7.440 02/26/2012 0355   HCO3 24.1* 02/26/2012 0355   TCO2 25.2 02/26/2012 0355   O2SAT 94.6 02/26/2012 0355   CBG (last 3)   Basename 02/25/12 1150  GLUCAP 105*    Assessment/Plan: S/P Procedure(s) (LRB): PERICARDIAL WINDOW (N/A) TRANSESOPHAGEAL ECHOCARDIOGRAM (TEE) (N/A)  She is back in sinus rhythm this am. Heparin and coumadin started. Will put chest tube to water seal and plan to remove it in am if no bleeding.   LOS: 3 days    BARTLE,BRYAN K 02/26/2012

## 2012-02-26 NOTE — Progress Notes (Signed)
Patient ID: Selena Taylor, female   DOB: 05-Nov-1935, 77 y.o.   MRN: [id number]    SUBJECTIVE: She is in NSR today.  Chest soreness controlled by pain meds.      Selena Taylor amiodarone  400 mg Oral BID  . aspirin EC  81 mg Oral Daily  . bisacodyl  10 mg Oral Daily  . colchicine  0.6 mg Oral Daily  . diltiazem  240 mg Oral Daily  . pantoprazole  40 mg Oral Daily  . sodium chloride  3 mL Intravenous Q12H  . Warfarin - Pharmacist Dosing Inpatient   Does not apply q1800    Filed Vitals:   02/26/12 0830 02/26/12 0900 02/26/12 1000 02/26/12 1100  BP:  102/73 126/93 140/84  Pulse: 89 97 93 78  Temp:      TempSrc:      Resp: 25 19 19 22   Height:      Weight:      SpO2: 97% 96% 97% 98%    Intake/Output Summary (Last 24 hours) at 02/26/12 1126 Last data filed at 02/26/12 1100  Gross per 24 hour  Intake 2881.67 ml  Output   1710 ml  Net 1171.67 ml    LABS: Basic Metabolic Panel:  Basename 02/26/12 0430 02/25/12 1800 02/25/12 0800  NA 132* 129* --  K 4.4 4.3 --  CL 100 96 --  CO2 25 24 --  GLUCOSE 128* 137* --  BUN 12 15 --  CREATININE 0.78 0.77 --  CALCIUM 8.3* 8.3* --  MG -- 1.9 1.7  PHOS -- -- --   Liver Function Tests:  Basename 02/24/12 1841 02/23/12 1315  AST 31 37  ALT 37* 38*  ALKPHOS 150* 142*  BILITOT 0.4 0.8  PROT 7.0 7.2  ALBUMIN 2.3* 2.5*   No results found for this basename: LIPASE:2,AMYLASE:2 in the last 72 hours CBC:  Basename 02/26/12 0430 02/24/12 1841 02/23/12 1315  WBC 8.8 11.0* --  NEUTROABS -- -- 9.2*  HGB 11.0* 11.8* --  HCT 32.5* 34.4* --  MCV 93.1 90.8 --  PLT 429* 432* --   Dg Chest Portable 1 View  02/25/2012  *RADIOLOGY REPORT*  Clinical Data: Persistent cough.  Pericardial effusion.  PORTABLE CHEST - 1 VIEW  Comparison: 02/24/2012 and 02/23/2012  Findings: Central catheter tip is in the superior vena cava. Pericardial drain in place.  Chronic cardiomegaly.  Small bilateral effusions appear to have resolved.  There is focal atelectasis  with air bronchograms at the left lung base.  Lungs are otherwise clear. Pulmonary vascularity is normal.  IMPRESSION: New focal atelectasis at the left lung base.   Original Report Authenticated By: Francene Boyers, M.D.      PHYSICAL EXAM General: NAD Neck: JVP 8-9 cm, no thyromegaly or thyroid nodule.  Lungs: Clear to auscultation bilaterally with normal respiratory effort. CV: Nondisplaced PMI.  Heart regular S1/S2, no S3/S4, no murmur.  No peripheral edema.   Abdomen: Soft, nontender, no hepatosplenomegaly, no distention.  Neurologic: Alert and oriented x 3.  Psych: Normal affect. Extremities: No clubbing or cyanosis.   TELEMETRY: Reviewed telemetry pt in NSR with PACs  ASSESSMENT AND PLAN: 77 yo with history of PAF presented with acute presumable viral pericarditis and a large pleural effusion.  1. Acute pericarditis with pleural effusion: Presumably viral.  S/p pericardial window.  Surgical pain controlled with narcotics.  She is also on colchicine (had course of indomethacin completed).  Will order repeat echo to make sure fluid drainage complete.  2.  Atrial fibrillation: On heparin/warfarin overlap (h/o TIA).  She is back in NSR on amiodarone today.  Continue amiodarone.  Will check with care coordination to see if she can get Xarelto.   Selena Taylor 02/26/2012 11:30 AM

## 2012-02-26 NOTE — Progress Notes (Signed)
ANTICOAGULATION CONSULT NOTE - Initial Consult  Pharmacy Consult for Warfarin (tonight), Heparin (AM - no bolus) Indication: atrial fibrillation  Allergies  Allergen Reactions  . Other Other (See Comments)    Novocaine.  REACTION:  Rapid heart rate  . Procaine Hcl     Rapid heart rate    Patient Measurements: Height: 5\' 2"  (157.5 cm) Weight: 127 lb (57.607 kg) IBW/kg (Calculated) : 50.1  Dosing weight:  54 kg  Vital Signs: Temp: 98.2 F (36.8 C) (02/01 1550) Temp src: Oral (02/01 1550) BP: 121/87 mmHg (02/01 1500) Pulse Rate: 66  (02/01 1500)  Labs:  Basename 02/26/12 0430 02/25/12 1800 02/25/12 0800 02/24/12 1841 02/24/12 0550  HGB 11.0* -- -- 11.8* --  HCT 32.5* -- -- 34.4* 36.9  PLT 429* -- -- 432* 419*  APTT -- 25 -- 31 --  LABPROT 15.1 14.7 -- 15.4* --  INR 1.21 1.17 -- 1.24 --  HEPARINUNFRC -- -- -- -- --  CREATININE 0.78 0.77 0.75 -- --  CKTOTAL -- -- -- -- --  CKMB -- -- -- -- --  TROPONINI -- -- -- -- --    Estimated Creatinine Clearance: 48.8 ml/min (by C-G formula based on Cr of 0.78).   Medical History: Past Medical History  Diagnosis Date  . TIA (transient ischemic attack)   . PAF (paroxysmal atrial fibrillation)     has been on Flecainide in the past. Stopped 02/10/11; Remains on coumadin anticoagulation; intol to Rythmol and failed DCCV; noted to be in AFlutter 5/13;  Echo 01/2011:  EF 55-60%, mild MR, mild LAE.  Myoview 6/12:  No ischemia, EF 74%  . HTN (hypertension)   . Endometriosis   . Osteopenia   . Insomnia   . Chronic anticoagulation     on coumadin  . Anxiety   . Pericardial effusion   . Shortness of breath   . Pericardial effusion 02/23/2012  . CHF (congestive heart failure)    Assessment: 76 YOF on chronic coumadin for afib, coumadin has been held since last week, s/p pericardial window on 1/31. INR 1.21 this AM, heparin level undetectable on 5units/hr, no interruption with infusion per RN.  HOME dose:  Warfarin 2.5 mg  daily  Drug/Drug interactions:  She is on Amiodarone which can potentiate the effects of her INR.    Goal of Therapy:  Heparin level 0.3-0.7 INR 2-3 Monitor platelets by anticoagulation protocol: Yes   Plan:   Will give Warfarin 2.5mg  po x 1 tonight   Increase heparin rate to 700 units/hr  F/u heparin level in AM  Monitor for bleeding complications  Check daily heparin level and CBC  Bayard Hugger, PharmD, BCPS  Clinical Pharmacist  Pager: [phone]   02/26/2012,4:08 PM

## 2012-02-26 NOTE — Progress Notes (Signed)
RT Note: RT was called to patients room by RN due to patients arterial line pressure bag leaking. Rt changed out the pressure bag. Arterial line continues to be in place and draws back well. Good waveform was observed as well. Patient tolerated well and RT will continue monitor.

## 2012-02-26 NOTE — Op Note (Signed)
NAMEBOBBYE, PETTI                  ACCOUNT NO.:  [id number]  MEDICAL RECORD NO.:  [id number]  LOCATION:  2313                         FACILITY:  [facility]  PHYSICIAN:  Sheliah Plane, MD    DATE OF BIRTH:  1935/04/15  DATE OF PROCEDURE:  02/25/2012 DATE OF DISCHARGE:                              OPERATIVE REPORT   PREOPERATIVE DIAGNOSIS:  Pericardial effusion, question of pericarditis.  POSTOPERATIVE DIAGNOSIS:  Pericardial effusion, question of pericarditis.  SURGICAL PROCEDURE:  Subxiphoid pericardial drainage with pericardial biopsy and subxiphoid window and TEE.  SURGEON:  Sheliah Plane, MD  FIRST ASSISTANT:  Coral Ceo, P.A.  BRIEF HISTORY:  The patient is a 77 year old female with intermittent episodes of atrial fibrillation.  She had been on Coumadin for approximately 10 years.  Recently, she has been in and out of the hospital with a moderate-sized pericardial effusion and atypical chest pain.  She has been followed by Dr. Graciela Husbands.  Anticoagulation had been stopped.  She had been evaluated for possible lupus, but day prior to surgery, was readmitted again with the same symptoms.  Echocardiograms have showed a moderate pericardial effusion.  CT scan confirmed that the effusion had increased slightly in size both for diagnostic purpose and to drain the effusion.  The subxiphoid pericardial window was recommended.  The patient agreed and signed informed consent.  DESCRIPTION OF PROCEDURE:  With arterial line in place, the patient underwent general endotracheal anesthesia without incident.  Prior to surgery, she was in atrial fibrillation.  A TEE probe was placed and showed moderate pericardial effusion without tamponade signs.  On echo, the left atrial appendage was carefully inspected and there was no evidence of atrial clot even after induction of general anesthesia, but before actually manipulating the heart at all.  The patient converted to a sinus rhythm and  maintained sinus rhythm throughout the procedure. The skin of the chest and legs were prepped with Betadine and draped in usual sterile manner.  A subxiphoid 5-mm incision was made.  Dissection was carried down through the fascia to the pericardium, which was slightly bulging.  The pericardium was opened and approximately 150 mL of serosanguineous, but not frankly bloody.  Pericardial effusion was drained.  The patient had extensive thickening of pericardium and evidence of marked epicardium and pericardial inflammation.  Portions of this fibrinous debris was submitted to Pathology as was a portion of pericardium.  Cultures of the fluid and cytologies including bowel cultures were obtained.  A 28 Blake drain was left in the inferior recess of the pericardium and brought out through a separate site adjacent to the incision.  Incision was then closed with interrupted 0 Vicryl, running 2-0 Vicryl in the subcutaneous tissue and 4-0 subcuticular stitch in skin edges.  Blood loss was minimal.  The patient did not require any blood products.  Sponge and needle count was reported as correct.  She tolerated the procedure without obvious complication, was extubated in the operating room and transferred to the recovery room for postoperative care.     Sheliah Plane, MD     EG/MEDQ  D:  02/26/2012  T:  02/26/2012  Job:  [id number]  cc:  Deboraha Sprang, MD, [facility]

## 2012-02-26 NOTE — Progress Notes (Signed)
Selena Taylor  77 y.o.  female  Subjective: Patient reports doing very well with modest chest discomfort. She walked in the hall today without dyspnea. Drainage from pericardial tube is modest and  Allergy: Other and Procaine hcl  Objective: Vital signs in last 24 hours: Temp:  [97.3 F (36.3 C)-98 F (36.7 C)] 97.5 F (36.4 C) (02/01 0800) Pulse Rate:  [78-117] 78  (02/01 1100) Resp:  [16-29] 22  (02/01 1100) BP: (91-140)/(55-98) 140/84 mmHg (02/01 1100) SpO2:  [90 %-99 %] 98 % (02/01 1100) Arterial Line BP: (83-138)/(60-82) 114/65 mmHg (02/01 1000) Weight:  [57.607 kg (127 lb)] 57.607 kg (127 lb) (02/01 0500)  57.607 kg (127 lb) Body mass index is 23.23 kg/(m^2).  Weight change: 3.221 kg (7 lb 1.6 oz) Last BM Date: 02/23/12  Intake/Output from previous day: 01/31 0701 - 02/01 0700 In: 3806.7 [P.O.:180; I.V.:3176.7; IV Piggyback:450] Out: 1720 [Urine:1620; Blood:10; Chest Tube:90]  General- Well developed; no acute distress Neck- No JVD, no carotid bruits Lungs- few rales and bronchial breath sounds at the bases; normal I:E ratio Cardiovascular- normal PMI; normal S1 and S2; no rub appreciated Abdomen- normal bowel sounds; soft and non-tender without masses or organomegaly Skin- Warm, no significant lesions Extremities- Nl distal pulses; no edema  CBC:   Basename 02/26/12 0430 02/24/12 1841  WBC 8.8 11.0*  HGB 11.0* 11.8*  HCT 32.5* 34.4*  PLT 429* 432*   BMET:  Basename 02/26/12 0430 02/25/12 1800  NA 132* 129*  K 4.4 4.3  CL 100 96  CO2 25 24  GLUCOSE 128* 137*  BUN 12 15  CREATININE 0.78 0.77  CALCIUM 8.3* 8.3*   Hepatic Function:   Basename 02/24/12 1841  PROT 7.0  ALBUMIN 2.3*  AST 31  ALT 37*  ALKPHOS 150*  BILITOT 0.4  BILIDIR --  IBILI --   GFR:  Estimated Creatinine Clearance: 48.8 ml/min (by C-G formula based on Cr of 0.78). Lipids:  No results found for this basename: CHOL,TRIG,HDL,LDL in the last 72 hours  EKG: Atypical atrial flutter with  variable AV block and controlled ventricular rate; delayed R-wave progression; nonspecific T wave abnormality   Telemetry:  Appears to be in sinus rhythm.  Imaging: Imaging results have been reviewed  Medications:  I have reviewed the patient's current medications. Scheduled:   . amiodarone  400 mg Oral BID  . aspirin EC  81 mg Oral Daily  . bisacodyl  10 mg Oral Daily  . colchicine  0.6 mg Oral Daily  . diltiazem  240 mg Oral Daily  . pantoprazole  40 mg Oral Daily  . sodium chloride  3 mL Intravenous Q12H  . Warfarin - Pharmacist Dosing Inpatient   Does not apply q1800   Infusions:    . heparin 500 Units/hr (02/26/12 1011)    Assessment/Plan: Atrial fibrillation/flutter: arrhythmia appears to have resolved after converting from fibrillation to flutter this morning. We will continue to follow rhythms, but cardioversion will probably not be necessary.   Pericarditis: Rate of production of pericardial fluid appears modest, which will hopefully allow training to be removed within the next day or 2.   LOS: 3 days    Bing 02/26/2012, 11:10 AM

## 2012-02-27 DIAGNOSIS — I309 Acute pericarditis, unspecified: Secondary | ICD-10-CM

## 2012-02-27 LAB — PROTIME-INR: Prothrombin Time: 17.7 seconds — ABNORMAL HIGH (ref 11.6–15.2)

## 2012-02-27 LAB — COMPREHENSIVE METABOLIC PANEL
ALT: 20 U/L (ref 0–35)
Calcium: 8.3 mg/dL — ABNORMAL LOW (ref 8.4–10.5)
GFR calc Af Amer: >90 mL/min (ref >90)
Glucose, Bld: 115 mg/dL — ABNORMAL HIGH (ref 70–99)
Sodium: 134 mEq/L — ABNORMAL LOW (ref 135–145)
Total Protein: 6 g/dL (ref 6.0–8.3)

## 2012-02-27 LAB — CBC
Hemoglobin: 10.7 g/dL — ABNORMAL LOW (ref 12.0–15.0)
MCH: 31 pg (ref 26.0–34.0)
MCHC: 32.7 g/dL (ref 30.0–36.0)

## 2012-02-27 LAB — HEPARIN LEVEL (UNFRACTIONATED)
Heparin Unfractionated: 0.23 IU/mL — ABNORMAL LOW (ref 0.30–0.70)
Heparin Unfractionated: 0.25 IU/mL — ABNORMAL LOW (ref 0.30–0.70)

## 2012-02-27 MED ORDER — TRAMADOL HCL 50 MG PO TABS
50.0000 mg | ORAL_TABLET | Freq: Two times a day (BID) | ORAL | Status: DC | PRN
  Administered 2012-02-27: 50 mg via ORAL
  Filled 2012-02-27 (×2): qty 1

## 2012-02-27 MED ORDER — ONDANSETRON HCL 4 MG/2ML IJ SOLN: 4.0000 mg | Freq: Three times a day (TID) | INTRAMUSCULAR | Status: DC | PRN

## 2012-02-27 MED ORDER — WARFARIN SODIUM 2.5 MG PO TABS
2.5000 mg | ORAL_TABLET | Freq: Once | ORAL | Status: AC
  Administered 2012-02-27: 2.5 mg via ORAL
  Filled 2012-02-27: qty 1

## 2012-02-27 NOTE — Progress Notes (Signed)
Patient ID: Selena Taylor, female   DOB: 05-24-1935, 77 y.o.   MRN: [id number]    SUBJECTIVE: She remains in NSR today.  Only mild pain at incision site.  Limited echo yesterday has not been uploaded but per tech no significant pericardial fluid.      Marland Kitchen amiodarone  400 mg Oral BID  . aspirin EC  81 mg Oral Daily  . bisacodyl  10 mg Oral Daily  . colchicine  0.6 mg Oral Daily  . diltiazem  240 mg Oral Daily  . pantoprazole  40 mg Oral Daily  . sodium chloride  3 mL Intravenous Q12H  . warfarin  2.5 mg Oral ONCE-1800  . Warfarin - Pharmacist Dosing Inpatient   Does not apply q1800    Filed Vitals:   02/27/12 0700 02/27/12 0800 02/27/12 0900 02/27/12 1000  BP: 118/97 119/75 109/71 108/59  Pulse: 66 69 72 79  Temp:  98.1 F (36.7 C)    TempSrc:  Oral    Resp: 10 19 21 24   Height:      Weight:      SpO2: 94% 96% 97% 98%    Intake/Output Summary (Last 24 hours) at 02/27/12 1109 Last data filed at 02/27/12 1000  Gross per 24 hour  Intake   2874 ml  Output   1650 ml  Net   1224 ml    LABS: Basic Metabolic Panel:  Basename 02/27/12 0445 02/26/12 0430 02/25/12 1800 02/25/12 0800  NA 134* 132* -- --  K 4.5 4.4 -- --  CL 102 100 -- --  CO2 22 25 -- --  GLUCOSE 115* 128* -- --  BUN 9 12 -- --  CREATININE 0.67 0.78 -- --  CALCIUM 8.3* 8.3* -- --  MG -- -- 1.9 1.7  PHOS -- -- -- --   Liver Function Tests:  [facility] 02/27/12 0445 02/24/12 1841  AST 17 31  ALT 20 37*  ALKPHOS 115 150*  BILITOT 0.2* 0.4  PROT 6.0 7.0  ALBUMIN 2.1* 2.3*   No results found for this basename: LIPASE:2,AMYLASE:2 in the last 72 hours CBC:  Basename 02/27/12 0445 02/26/12 0430  WBC 8.0 8.8  NEUTROABS -- --  HGB 10.7* 11.0*  HCT 32.7* 32.5*  MCV 94.8 93.1  PLT 414* 429*   Dg Chest Portable 1 View  02/25/2012  *RADIOLOGY REPORT*  Clinical Data: Persistent cough.  Pericardial effusion.  PORTABLE CHEST - 1 VIEW  Comparison: 02/24/2012 and 02/23/2012  Findings: Central catheter tip is in  the superior vena cava. Pericardial drain in place.  Chronic cardiomegaly.  Small bilateral effusions appear to have resolved.  There is focal atelectasis with air bronchograms at the left lung base.  Lungs are otherwise clear. Pulmonary vascularity is normal.  IMPRESSION: New focal atelectasis at the left lung base.   Original Report Authenticated By: Francene Boyers, M.D.      PHYSICAL EXAM General: NAD Neck: JVP 8-9 cm, no thyromegaly or thyroid nodule.  Lungs: Clear to auscultation bilaterally with normal respiratory effort. CV: Nondisplaced PMI.  Heart regular S1/S2, no S3/S4, no murmur.  No peripheral edema.   Abdomen: Soft, nontender, no hepatosplenomegaly, no distention.  Neurologic: Alert and oriented x 3.  Psych: Normal affect. Extremities: No clubbing or cyanosis.   TELEMETRY: Reviewed telemetry pt in NSR with PACs  ASSESSMENT AND PLAN: 77 yo with history of PAF presented with acute presumable viral pericarditis and a large pleural effusion.  1. Acute pericarditis with pleural effusion: Presumably viral.  S/p pericardial window.  Pain mostly resolved, just some surgical site soreness.  She is also on colchicine (had course of indomethacin completed).    2. Atrial fibrillation: On heparin/warfarin overlap (h/o TIA).  She is back in NSR on amiodarone today.  Continue amiodarone.  Will check with care coordination to see if she can get Xarelto.   [name] 02/27/2012 11:09 AM

## 2012-02-27 NOTE — Progress Notes (Signed)
2 Days Post-Op Procedure(s) (LRB): PERICARDIAL WINDOW (N/A) TRANSESOPHAGEAL ECHOCARDIOGRAM (TEE) (N/A) Subjective: No complaints   Objective: Vital signs in last 24 hours: Temp:  [97.4 F (36.3 C)-98.2 F (36.8 C)] 98.1 F (36.7 C) (02/02 0800) Pulse Rate:  [46-84] 79  (02/02 1000) Cardiac Rhythm:  [-] Normal sinus rhythm (02/02 0900) Resp:  [10-26] 24  (02/02 1000) BP: (96-164)/(54-97) 108/59 mmHg (02/02 1000) SpO2:  [91 %-99 %] 98 % (02/02 1000) Arterial Line BP: (75-95)/(66-69) 95/69 mmHg (02/01 1300)  Hemodynamic parameters for last 24 hours:    Intake/Output from previous day: 02/01 0701 - 02/02 0700 In: 3040 [P.O.:400; I.V.:2590; IV Piggyback:50] Out: 1470 [Urine:1460; Chest Tube:10] Intake/Output this shift: Total I/O In: 309 [I.V.:309] Out: 350 [Urine:300; Chest Tube:50]  General appearance: alert and cooperative Heart: regular rate and rhythm, S1, S2 normal, no murmur, click, rub or gallop Lungs: clear to auscultation bilaterally  Lab Results:  Basename 02/27/12 0445 02/26/12 0430  WBC 8.0 8.8  HGB 10.7* 11.0*  HCT 32.7* 32.5*  PLT 414* 429*   BMET:  Basename 02/27/12 0445 02/26/12 0430  NA 134* 132*  K 4.5 4.4  CL 102 100  CO2 22 25  GLUCOSE 115* 128*  BUN 9 12  CREATININE 0.67 0.78  CALCIUM 8.3* 8.3*    PT/INR:  Basename 02/27/12 0445  LABPROT 17.7*  INR 1.50*   ABG    Component Value Date/Time   PHART 7.440 02/26/2012 0355   HCO3 24.1* 02/26/2012 0355   TCO2 25.2 02/26/2012 0355   O2SAT 94.6 02/26/2012 0355   CBG (last 3)   Basename 02/25/12 1150  GLUCAP 105*    Assessment/Plan: S/P Procedure(s) (LRB): PERICARDIAL WINDOW (N/A) TRANSESOPHAGEAL ECHOCARDIOGRAM (TEE) (N/A)  Remove pericardial tube. Further plans per cardiology.   LOS: 4 days    Evelene Croon K 02/27/2012

## 2012-02-27 NOTE — Progress Notes (Addendum)
ANTICOAGULATION CONSULT NOTE - Follow Up Consult  Pharmacy Consult for Heparin Indication: atrial fibrillation  Allergies  Allergen Reactions  . Other Other (See Comments)    Novocaine.  REACTION:  Rapid heart rate  . Procaine Hcl     Rapid heart rate    Patient Measurements: Height: 5\' 2"  (157.5 cm) Weight: 130 lb 1.1 oz (59 kg) IBW/kg (Calculated) : 50.1  Heparin Dosing Weight: 54 kg  Vital Signs: Temp: 97.6 F (36.4 C) (02/02 0300) Temp src: Oral (02/02 0300) BP: 118/97 mmHg (02/02 0700) Pulse Rate: 66  (02/02 0700)  Labs:  Basename 02/27/12 0445 02/26/12 1754 02/26/12 0430 02/25/12 1800 02/24/12 1841  HGB 10.7* -- 11.0* -- --  HCT 32.7* -- 32.5* -- 34.4*  PLT 414* -- 429* -- 432*  APTT -- -- -- 25 31  LABPROT 17.7* -- 15.1 14.7 --  INR 1.50* -- 1.21 1.17 --  HEPARINUNFRC 0.23* <0.10* -- -- --  CREATININE 0.67 -- 0.78 0.77 --  CKTOTAL -- -- -- -- --  CKMB -- -- -- -- --  TROPONINI -- -- -- -- --    Estimated Creatinine Clearance: 48.8 ml/min (by C-G formula based on Cr of 0.67).   Medications:  Scheduled:    . amiodarone  400 mg Oral BID  . aspirin EC  81 mg Oral Daily  . bisacodyl  10 mg Oral Daily  . [COMPLETED] cefUROXime (ZINACEF)  IV  1.5 g Intravenous Q12H  . colchicine  0.6 mg Oral Daily  . diltiazem  240 mg Oral Daily  . pantoprazole  40 mg Oral Daily  . sodium chloride  3 mL Intravenous Q12H  . [COMPLETED] warfarin  2.5 mg Oral ONCE-1800  . Warfarin - Pharmacist Dosing Inpatient   Does not apply q1800   Infusions:    . sodium chloride 20 mL (02/26/12 1011)  . dextrose 5 % and 0.45 % NaCl with KCl 20 mEq/L 100 mL/hr at 02/26/12 1820  . heparin 700 Units/hr (02/26/12 2000)    Assessment: 76 YOF on chronic coumadin for afib, coumadin has been held since last week, s/p pericardial window on 1/31. Coumadin resumed starting 1/31 with heparin overlap until INR is therapeutic. Heparin level this morning is 0.23 and her INR is 1.50. No bleeding or  problems with heparin infusion per RN.  HOME dose: Warfarin 2.5 mg daily  Drug/Drug interactions: She is on Amiodarone which can potentiate the effects of her INR.   Goal of Therapy:  INR 2-3 Heparin level 0.3-0.7 units/ml Monitor platelets by anticoagulation protocol: Yes   Plan:  Increase heparin infusion to 800 units/hr Check heparin level in 8 hours and daily while on heparin Coumadin 2.5mg  x 1 dose F/u morning INR Continue to monitor H&H and platelets  Lillia Pauls, PharmD Clinical Pharmacist Pager: [phone] Phone: [phone] 02/27/2012 7:22 AM   Addendum: Recheck heparin level remains subtherapeutic after rate increase this morning.  Plan:  increase heparin rate to 1000 units/hr F/u heparin level 2330

## 2012-02-28 ENCOUNTER — Other Ambulatory Visit (HOSPITAL_COMMUNITY): Payer: Medicare Other

## 2012-02-28 ENCOUNTER — Other Ambulatory Visit: Payer: Self-pay | Admitting: *Deleted

## 2012-02-28 ENCOUNTER — Encounter (HOSPITAL_COMMUNITY): Payer: Self-pay | Admitting: Cardiothoracic Surgery

## 2012-02-28 DIAGNOSIS — I319 Disease of pericardium, unspecified: Secondary | ICD-10-CM

## 2012-02-28 LAB — HEPARIN LEVEL (UNFRACTIONATED): Heparin Unfractionated: 0.31 IU/mL (ref 0.30–0.70)

## 2012-02-28 LAB — BODY FLUID CULTURE

## 2012-02-28 LAB — PROTIME-INR: INR: 2.13 — ABNORMAL HIGH (ref 0.00–1.49)

## 2012-02-28 MED ORDER — COLCHICINE 0.6 MG PO TABS
0.6000 mg | ORAL_TABLET | Freq: Two times a day (BID) | ORAL | Status: DC
  Administered 2012-02-28 – 2012-02-29 (×2): 0.6 mg via ORAL
  Filled 2012-02-28 (×4): qty 1

## 2012-02-28 MED ORDER — INDOMETHACIN 50 MG PO CAPS
50.0000 mg | ORAL_CAPSULE | Freq: Two times a day (BID) | ORAL | Status: DC
  Administered 2012-02-28 – 2012-02-29 (×2): 50 mg via ORAL
  Filled 2012-02-28 (×4): qty 1

## 2012-02-28 MED ORDER — FUROSEMIDE 10 MG/ML IJ SOLN
40.0000 mg | Freq: Once | INTRAMUSCULAR | Status: AC
  Administered 2012-02-28: 40 mg via INTRAVENOUS
  Filled 2012-02-28: qty 4

## 2012-02-28 NOTE — Care Management Note (Signed)
    Page 1 of 1   02/28/2012     10:20:09 AM   CARE MANAGEMENT NOTE 02/28/2012  Patient:  Selena Taylor, Selena Taylor   Account Number:  [id number]  Date Initiated:  02/25/2012  Documentation initiated by:  [facility]  Subjective/Objective Assessment:   Admitted with SOB - recently discharged with pericardial effusion.  02-25-12 - post op pericardial effusion.     Action/Plan:   Anticipated DC Date:  03/01/2012   Anticipated DC Plan:  HOME W HOME HEALTH SERVICES      DC Planning Services  CM consult      Choice offered to / List presented to:             Status of service:  In process, will continue to follow Medicare Important Message given?   (If response is "NO", the following Medicare IM given date fields will be blank) Date Medicare IM given:   Date Additional Medicare IM given:    Discharge Disposition:    Per UR Regulation:  Reviewed for med. necessity/level of care/duration of stay  If discussed at Long Length of Stay Meetings, dates discussed:    Comments:  ContactAngelica Pou Daughter [phone]                  [place], [place] [phone]   [phone]                  Crowder,Carl       [phone]  02-28-12 10:15am Avie Arenas, RNBSN [phone] Requesting possibility of xaralto on dc.  per insurance for a 30 day supply co-pay $30.00 @ preferred phamacy: such as [facility], [facility].

## 2012-02-28 NOTE — Progress Notes (Addendum)
                   [address]            [place] [id number]          [phone]      3 Days Post-Op Procedure(s) (LRB): PERICARDIAL WINDOW (N/A) TRANSESOPHAGEAL ECHOCARDIOGRAM (TEE) (N/A)  Subjective: Patient feeling ok this am  Objective: Vital signs in last 24 hours: Temp:  [97.7 F (36.5 C)-98.5 F (36.9 C)] 98.2 F (36.8 C) (02/03 0516) Pulse Rate:  [67-79] 75  (02/03 0516) Cardiac Rhythm:  [-] Normal sinus rhythm (02/02 2130) Resp:  [18-24] 18  (02/03 0516) BP: (105-132)/(59-74) 115/66 mmHg (02/03 0516) SpO2:  [90 %-98 %] 90 % (02/03 0516) Weight:  [59.058 kg (130 lb 3.2 oz)] 59.058 kg (130 lb 3.2 oz) (02/03 0516)   Current Weight  02/28/12 59.058 kg (130 lb 3.2 oz)      Intake/Output from previous day: 02/02 0701 - 02/03 0700 In: 376.8 [I.V.:376.8] Out: 550 [Urine:500; Chest Tube:50]   Physical Exam:  Cardiovascular: IRRR IRRR. Pulmonary: Clear to auscultation bilaterally; no rales, wheezes, or rhonchi. Abdomen: Soft, non tender, bowel sounds present. Extremities: No lower extremity edema. Wound: Clean and dry.  No erythema or signs of infection.  Lab Results: CBC: Basename 02/27/12 0445 02/26/12 0430  WBC 8.0 8.8  HGB 10.7* 11.0*  HCT 32.7* 32.5*  PLT 414* 429*   BMET:  Basename 02/27/12 0445 02/26/12 0430  NA 134* 132*  K 4.5 4.4  CL 102 100  CO2 22 25  GLUCOSE 115* 128*  BUN 9 12  CREATININE 0.67 0.78  CALCIUM 8.3* 8.3*    PT/INR:  Lab Results  Component Value Date   INR 1.50* 02/27/2012   INR 1.21 02/26/2012   INR 1.17 02/25/2012   PROTIME 17.1 07/09/2008   ABG:  INR: Will add last result for INR, ABG once components are confirmed Will add last 4 CBG results once components are confirmed  Assessment/Plan:  1. CV - Afib with controlled ventricular rate. On Amiodarone 400 bid, Cardizem 240 daily, and Coumadin. Await today's INR in order to dose Coumadin. Per cardiology 2.  Acute blood loss anemia - Last H and H  10.7 and 32.7. 3.Pericardial tube removed yesterday. Will check CXR in am.  ZIMMERMAN,DONIELLE MPA-C 02/28/2012,8:04 AM   I have seen and examined Selena Taylor and agree with the above assessment  and plan.  Delight Ovens MD Beeper [phone] Office [phone] 02/28/2012 1:11 PM

## 2012-02-28 NOTE — Progress Notes (Signed)
ANTICOAGULATION CONSULT NOTE - Follow Up Consult  Pharmacy Consult for heparin Indication: atrial fibrillation  Labs:  Basename 02/27/12 2352 02/27/12 1444 02/27/12 0445 02/26/12 0430 02/25/12 1800  HGB -- -- 10.7* 11.0* --  HCT -- -- 32.7* 32.5* --  PLT -- -- 414* 429* --  APTT -- -- -- -- 25  LABPROT -- -- 17.7* 15.1 14.7  INR -- -- 1.50* 1.21 1.17  HEPARINUNFRC 0.33 0.25* 0.23* -- --  CREATININE -- -- 0.67 0.78 0.77  CKTOTAL -- -- -- -- --  CKMB -- -- -- -- --  TROPONINI -- -- -- -- --    Assessment/Plan:  77yo female now therapeutic on heparin after multiple rate increases.  Will continue gtt at current rate and confirm stable with am labs.  Colleen Can PharmD BCPS 02/28/2012,12:47 AM

## 2012-02-28 NOTE — Progress Notes (Signed)
Patient: ALAISHA EVERSLEY Date of Encounter: 02/28/2012, 11:08 AM Admit date: 02/23/2012     Subjective  Ms. Stull states she is feeling okay this AM. She denies CP or SOB. Pericardial drain removed 02/27/2012.   Objective  Physical Exam: Vitals: BP 102/69  Pulse 88  Temp 98.2 F (36.8 C) (Oral)  Resp 18  Ht 5\' 2"  (1.575 m)  Wt 130 lb 3.2 oz (59.058 kg)  BMI 23.81 kg/m2  SpO2 90% General: Well developed, thin 77 year old female in no acute distress. Neck: Supple. JVD not elevated. Lungs: Clear bilaterally to auscultation without wheezes, rales, or rhonchi. Breathing is unlabored. Heart: Irregular S1 S2 without murmur, rub or gallop.  Abdomen: Soft, non-distended. Extremities: No clubbing or cyanosis. No edema.   Neuro: Alert and oriented X 3. Moves all extremities spontaneously. No focal deficits.  Intake/Output:  Intake/Output Summary (Last 24 hours) at 02/28/12 1108 Last data filed at 02/28/12 0817  Gross per 24 hour  Intake 307.83 ml  Output    200 ml  Net 107.83 ml    Inpatient Medications:     . amiodarone  400 mg Oral BID  . aspirin EC  81 mg Oral Daily  . colchicine  0.6 mg Oral Daily  . diltiazem  240 mg Oral Daily  . pantoprazole  40 mg Oral Daily  . Warfarin - Pharmacist Dosing Inpatient   Does not apply q1800    Labs:  [facility] 02/27/12 0445 02/26/12 0430 02/25/12 1800  NA 134* 132* --  K 4.5 4.4 --  CL 102 100 --  CO2 22 25 --  GLUCOSE 115* 128* --  BUN 9 12 --  CREATININE 0.67 0.78 --  CALCIUM 8.3* 8.3* --  MG -- -- 1.9  PHOS -- -- --    Basename 02/27/12 0445  AST 17  ALT 20  ALKPHOS 115  BILITOT 0.2*  PROT 6.0  ALBUMIN 2.1*    Basename 02/27/12 0445 02/26/12 0430  WBC 8.0 8.8  NEUTROABS -- --  HGB 10.7* 11.0*  HCT 32.7* 32.5*  MCV 94.8 93.1  PLT 414* 429*    Basename 02/28/12 0645  INR 2.13*    Radiology/Studies: Ct Angio Chest Pe W/cm &/or Wo Cm  02/24/2012  *RADIOLOGY REPORT*  Clinical Data: Shortness of breath,  lower extremity edema, history of pericarditis, evaluate for pulmonary embolism  CT ANGIOGRAPHY CHEST  Technique:  Multidetector CT imaging of the chest using the standard protocol during bolus administration of intravenous contrast. Multiplanar reconstructed images including MIPs were obtained and reviewed to evaluate the vascular anatomy.  Contrast: OMNIPAQUE IOHEXOL 350 MG/ML SOLN  Comparison: Chest CT - 02/05/2012  Vascular Findings:  There is adequate opacification of the pulmonary arterial system of the main pulmonary artery measuring 371 HU.  No discrete filling defects are seen within the pulmonary arterial tree to suggest pulmonary embolism.  Normal caliber of the main pulmonary artery.  Cardiomegaly.  Suspect a minimal increase in previously noted pericardial effusion.  Normal caliber of the thoracic aorta. Conventional configuration of the aortic arch.  No definite periaortic stranding.  --------------------------------------------------------  Nonvascular findings:  Interval increase in now small to moderate-sized bilateral pleural effusions.  Bibasilar dependent ground-glass opacity favored to represent atelectasis.  No focal airspace opacities with air bronchograms.  There is partial atelectasis/collapse of the medial and posterior basilar segment of the left lower lobe which is likely secondary to a combination of the adjacent pleural fluid and cardiomegaly. Central pulmonary  airways are patent.  No pneumothorax.  Scattered shoddy mediastinal lymph nodes are not enlarged by CT criteria.  Index precarinal lymph node measures 0.9 cm in diameter but maintains a benign fatty hila (image [phone], series four). No definite hilar or axillary lymphadenopathy.  Limited early arterial phase evaluation of the upper abdomen is normal. Interval resolution of previously noted subtle stranding within the gastrohepatic ligament.  No acute or aggressive osseous abnormalities.  IMPRESSION:  1.  Negative for  pulmonary embolism.  2.  Suspected minimal increase in pericardial effusion.  Again, pericarditis is not excluded. 3.  Interval increase in now small to moderate-sized bilateral pleural effusions.  No definite CT evidence of pulmonary edema.   Original Report Authenticated By: Tacey Ruiz, MD    Dg Chest Portable 1 View  02/25/2012  *RADIOLOGY REPORT*  Clinical Data: Persistent cough.  Pericardial effusion.  PORTABLE CHEST - 1 VIEW  Comparison: 02/24/2012 and 02/23/2012  Findings: Central catheter tip is in the superior vena cava. Pericardial drain in place.  Chronic cardiomegaly.  Small bilateral effusions appear to have resolved.  There is focal atelectasis with air bronchograms at the left lung base.  Lungs are otherwise clear. Pulmonary vascularity is normal.  IMPRESSION: New focal atelectasis at the left lung base.   Original Report Authenticated By: Francene Boyers, M.D.     Echocardiogram: post pericardial window 02/25/2012 Normal LV function, EF 60-65%, mildly dilated atria, mildly dilated RV with normal RV function, no significant valvular abnormalities, no effusion  Telemetry: atrial fibrillation since ~3AM today    Assessment and Plan  1. Pericarditis with large pericardial effusion s/p pericardial window 02/25/2012 Pain improved Repeat echo shows no effusion Continue NSAIDs and colchicine Referral to Rhematology as outpt 2. Atrial fibrillation Went back into AF ~3 AM today; asymptomatic Continue amiodarone and diltiazem Warfarin resumed after pericardial window; INR 2.1 today 3. Anemia Acute blood loss anemia; Hgb/Hct stable 11/32  Further plans per Dr. Graciela Husbands Signed, EDMISTEN, Nehemiah Settle PA-C  Discussed with Dr Karl Pock; ok to go home  Will transition to Rivaroxaban and begin on Wednesday Will see next week Needs rheum consult for pericarditis and initial symptoms f myalgias  The concern that i have is PMR and the assoc need for steroids  ? Temporal artery biopsy

## 2012-02-28 NOTE — Progress Notes (Signed)
Pt went into Afib this morning at 0342. Heart rate controlled in 80s. Will continue to monitor.  Alfonso Ellis, RN

## 2012-02-28 NOTE — Progress Notes (Signed)
Pt and son were confused re disposition plannning.  She is still a little unsettled and has had some sob which may be fluid related or afib or anxiety. .  Begin indomethacin 50 bid 10 more days and increase colchicine back to bid x 3 months We will plan to mobilize today, diuretics today and discharge in am

## 2012-02-28 NOTE — Progress Notes (Signed)
ANTICOAGULATION CONSULT NOTE - Follow Up Consult  Pharmacy Consult for heparin Indication: atrial fibrillation  Labs:  Basename 02/28/12 0645 02/27/12 2352 02/27/12 1444 02/27/12 0445 02/26/12 0430 02/25/12 1800  HGB -- -- -- 10.7* 11.0* --  HCT -- -- -- 32.7* 32.5* --  PLT -- -- -- 414* 429* --  APTT -- -- -- -- -- 25  LABPROT 22.9* -- -- 17.7* 15.1 --  INR 2.13* -- -- 1.50* 1.21 --  HEPARINUNFRC 0.31 0.33 0.25* -- -- --  CREATININE -- -- -- 0.67 0.78 0.77  CKTOTAL -- -- -- -- -- --  CKMB -- -- -- -- -- --  TROPONINI -- -- -- -- -- --    Assessment: 77yo female now therapeutic on heparin after multiple rate increases.    Heparin level therapeutic at 0.31  Jump in INR to 2.13  Plan: 1) No change in heparin rate 2) DC heparin since INR is therapeutic? 3) Recommend no Coumadin today with jump in INR  Thank you. Okey Regal, PharmD [phone]  02/28/2012,9:23 AM

## 2012-02-29 ENCOUNTER — Inpatient Hospital Stay (HOSPITAL_COMMUNITY): Payer: Medicare Other

## 2012-02-29 ENCOUNTER — Encounter: Payer: Medicare Other | Admitting: Internal Medicine

## 2012-02-29 LAB — PROTIME-INR
INR: 2.03 — ABNORMAL HIGH (ref 0.00–1.49)
Prothrombin Time: 22.1 seconds — ABNORMAL HIGH (ref 11.6–15.2)

## 2012-02-29 IMAGING — CR DG CHEST 2V
2 series · 2 of 2 positions shown · non-contrast
Comparison: [DATE]

CLINICAL DATA: Status post pericardial window

CHEST - 2 VIEW

[w chest pa]
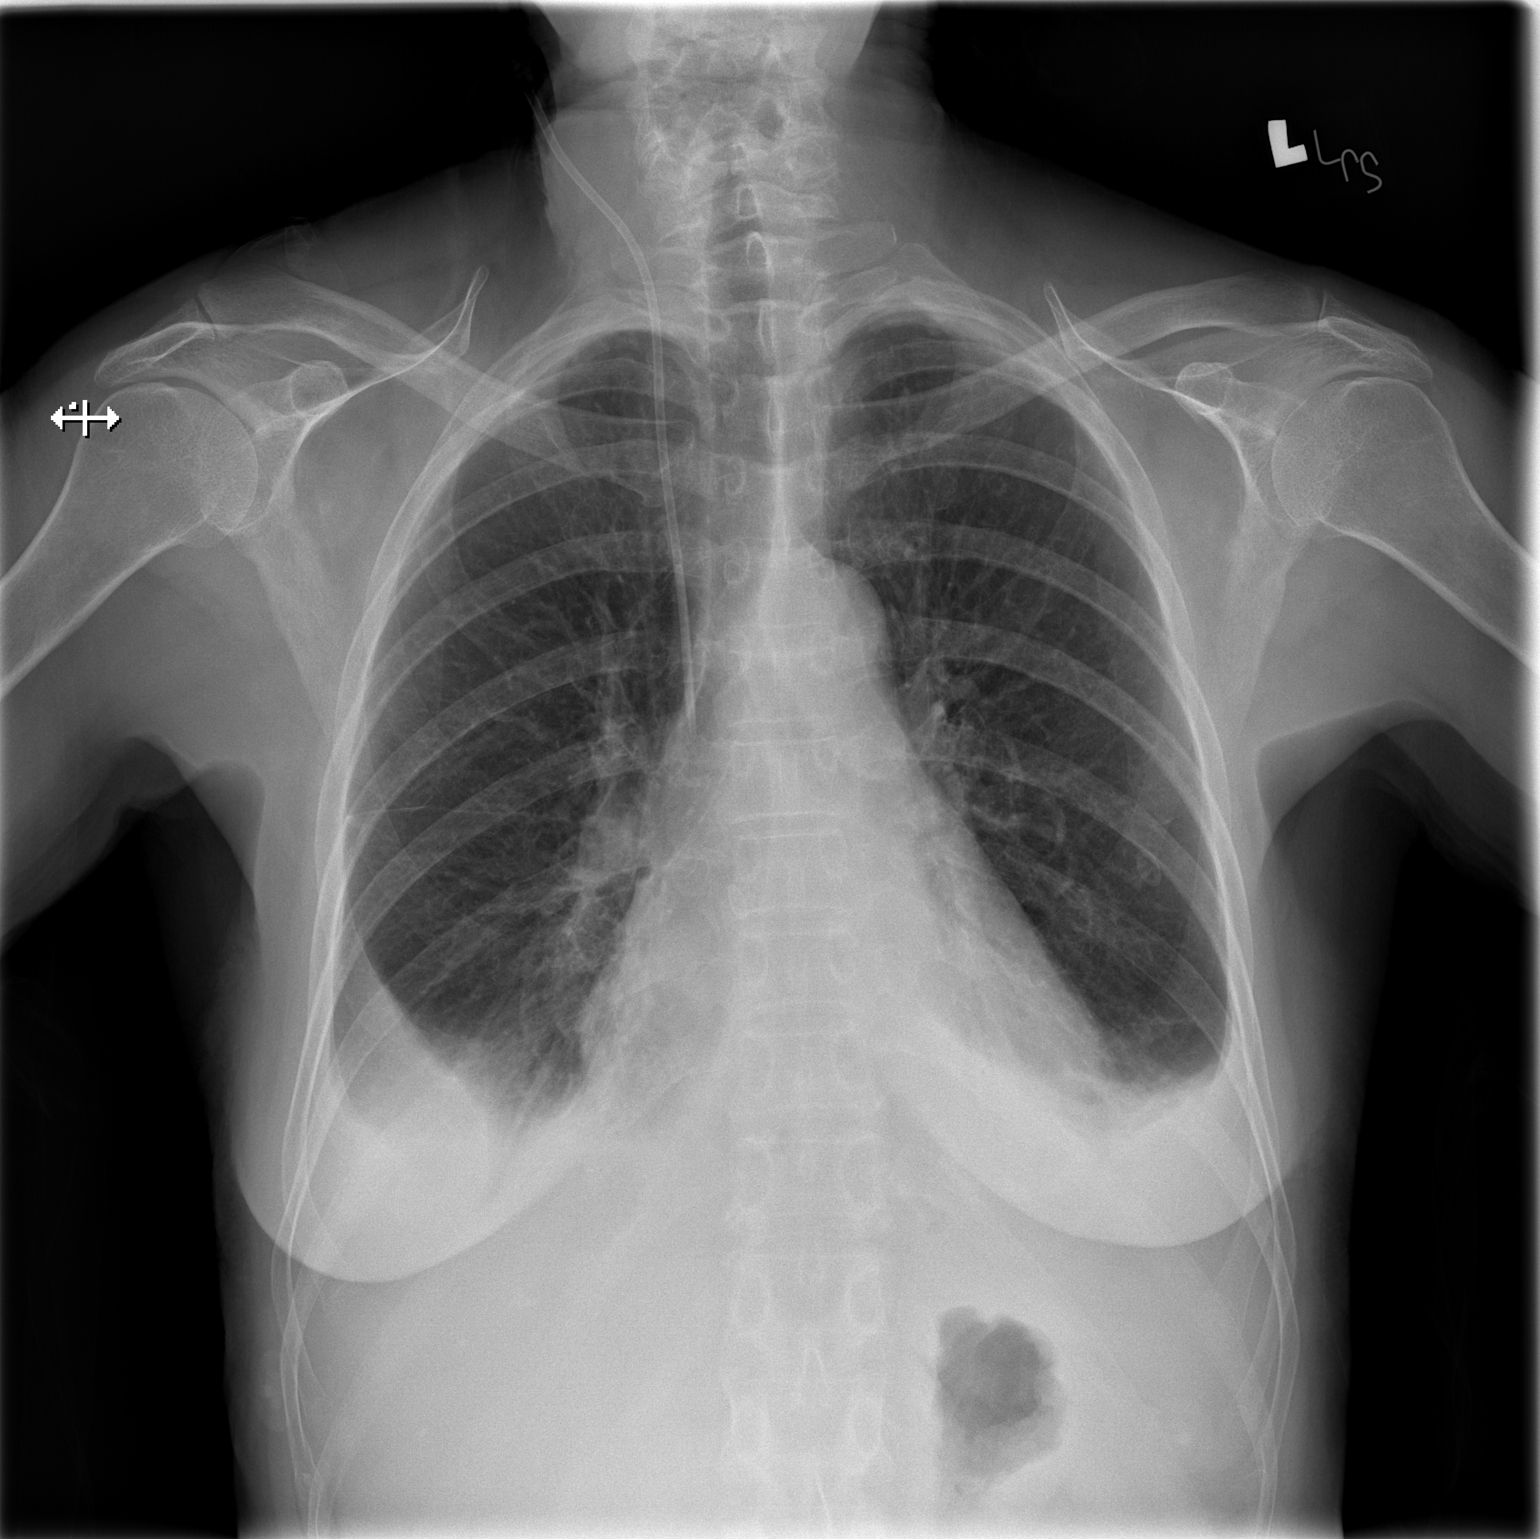

[w chest lat]
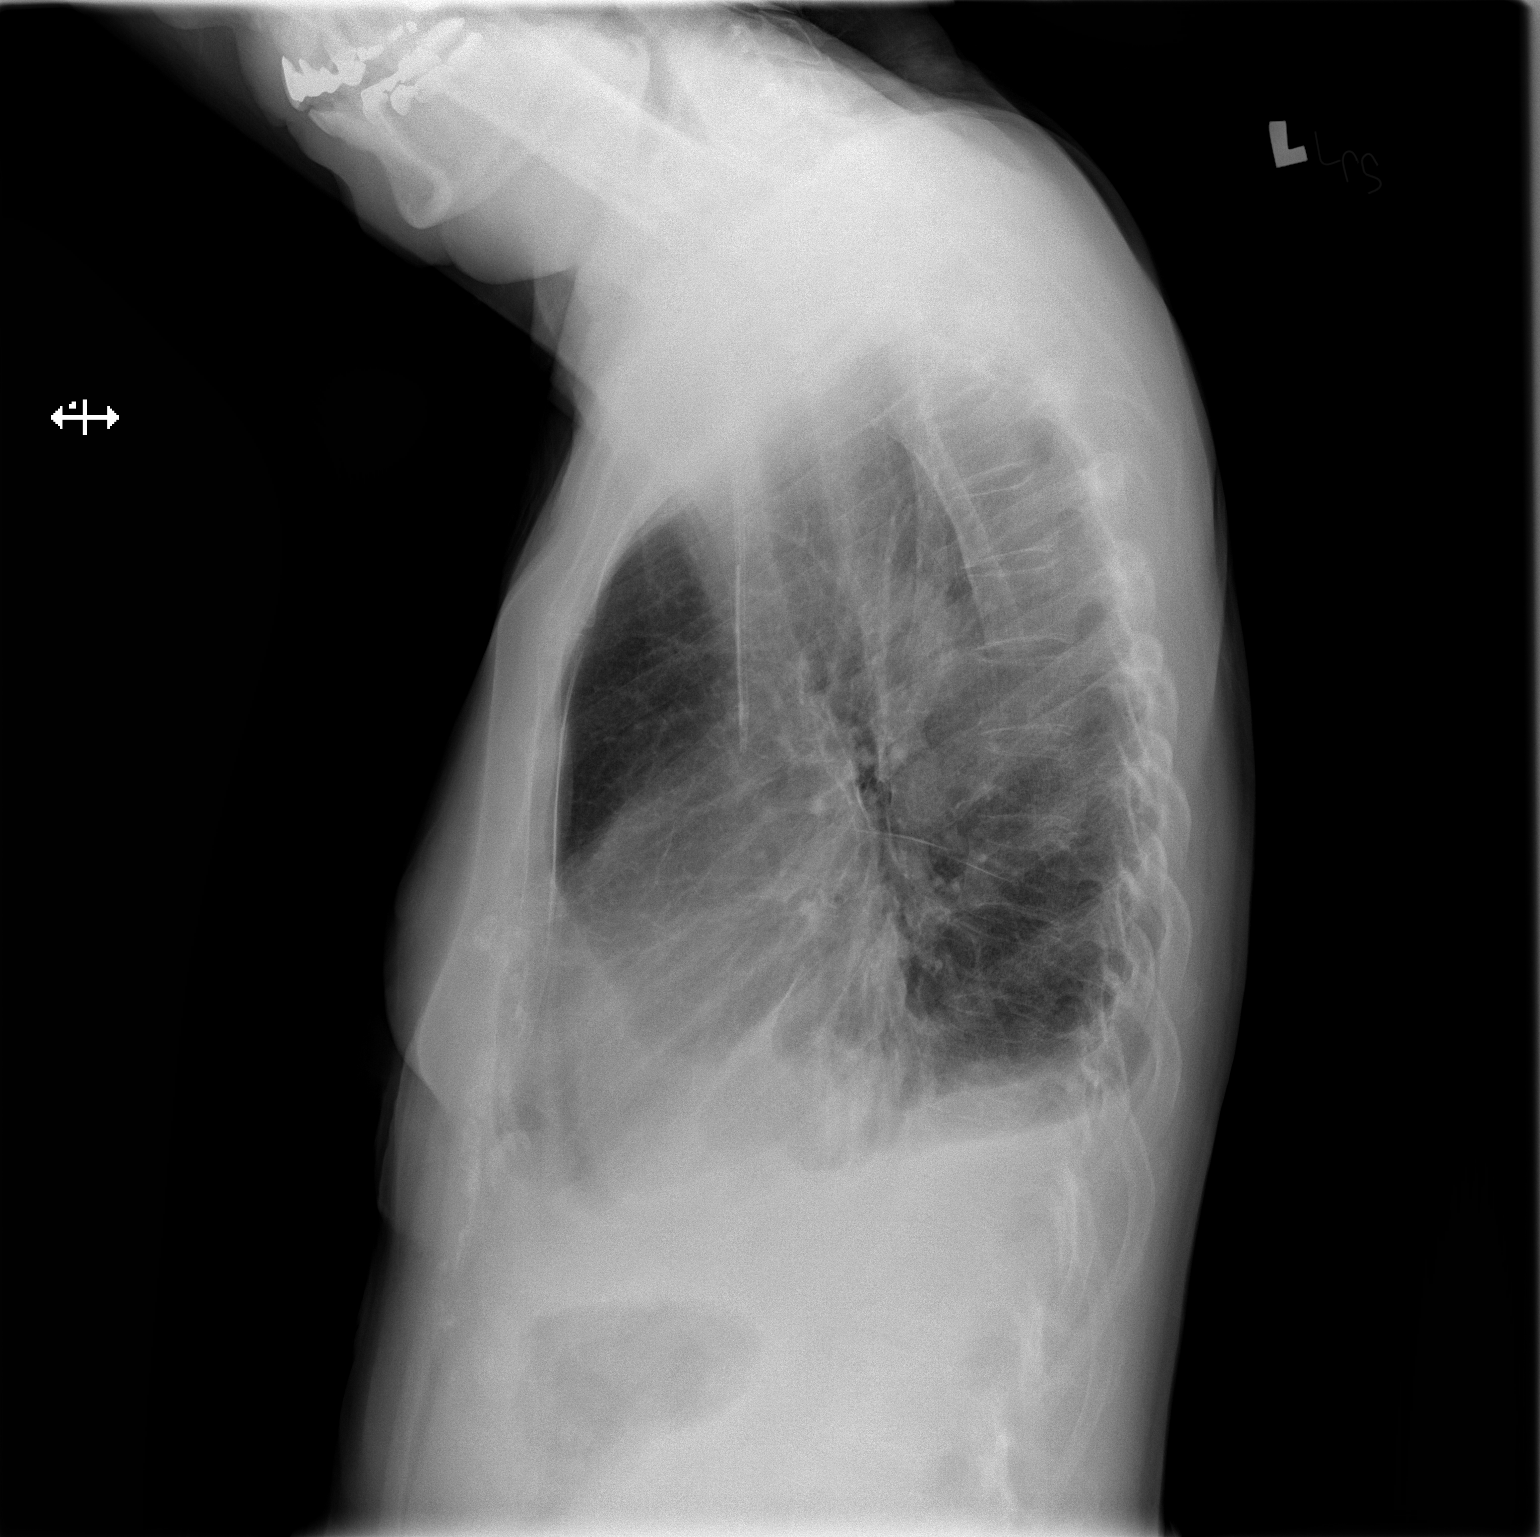

[2 of 2 positions shown; findings below may reference images not displayed]

FINDINGS: Small bilateral pleural effusions, right greater than
left, with associated bibasilar atelectasis.  No pneumothorax.

The heart is top normal in size, improved.

Stable right IJ venous catheter.

Visualized osseous structures are within normal limits.
IMPRESSION: Small bilateral pleural effusions with associated bibasilar
atelectasis.

## 2012-02-29 MED ORDER — RIVAROXABAN 20 MG PO TABS: 20.0000 mg | ORAL_TABLET | Freq: Every day | ORAL | Status: DC

## 2012-02-29 MED ORDER — INDOMETHACIN 50 MG PO CAPS: 50.0000 mg | ORAL_CAPSULE | Freq: Two times a day (BID) | ORAL | Status: DC

## 2012-02-29 MED ORDER — COLCHICINE 0.6 MG PO TABS: 0.6000 mg | ORAL_TABLET | Freq: Two times a day (BID) | ORAL | Status: DC

## 2012-02-29 NOTE — Progress Notes (Signed)
Pt discharged per MD order and protocol. All discharge instructions reviewed with patient and son. Pt aware of all follow up appointments.

## 2012-02-29 NOTE — Progress Notes (Addendum)
                   [address]            [place] [address]          [phone]      4 Days Post-Op Procedure(s) (LRB): PERICARDIAL WINDOW (N/A) TRANSESOPHAGEAL ECHOCARDIOGRAM (TEE) (N/A)  Subjective: Patient without complaints this am. She states her breathing is better  Objective: Vital signs in last 24 hours: Temp:  [97.8 F (36.6 C)-98.2 F (36.8 C)] 97.8 F (36.6 C) (02/04 0422) Pulse Rate:  [88-95] 95  (02/04 0422) Cardiac Rhythm:  [-] Normal sinus rhythm (02/03 2017) Resp:  [18] 18  (02/04 0422) BP: (102-118)/(57-80) 115/80 mmHg (02/04 0422) SpO2:  [93 %-95 %] 95 % (02/04 0422)   Current Weight  02/28/12 59.058 kg (130 lb 3.2 oz)      Intake/Output from previous day: 02/03 0701 - 02/04 0700 In: 720 [P.O.:720] Out: 1050 [Urine:1050]   Physical Exam:  Cardiovascular: RRR Pulmonary: Clear to auscultation bilaterally; no rales, wheezes, or rhonchi. Abdomen: Soft, non tender, bowel sounds present. Extremities: No lower extremity edema. Wound: Clean and dry.  No erythema or signs of infection.  Lab Results: CBC:  Basename 02/27/12 0445  WBC 8.0  HGB 10.7*  HCT 32.7*  PLT 414*   BMET:   Basename 02/27/12 0445  NA 134*  K 4.5  CL 102  CO2 22  GLUCOSE 115*  BUN 9  CREATININE 0.67  CALCIUM 8.3*    PT/INR:  Lab Results  Component Value Date   INR 2.13* 02/28/2012   INR 1.50* 02/27/2012   INR 1.21 02/26/2012   PROTIME 17.1 07/09/2008   ABG:  INR: Will add last result for INR, ABG once components are confirmed Will add last 4 CBG results once components are confirmed  Assessment/Plan:  1. CV - Pafib with controlled ventricular rate. SR this am.On Amiodarone 400 bid, Cardizem 240 daily, and Coumadin. Await today's INR in order to dose Coumadin. Per cardiology 2.  Acute blood loss anemia - Last H and H 10.7 and 32.7. 3.Pulmonary-chest x ray shows small bilateral pleural effusions, no pneumothorax, mild  cardiomegaly.  ZIMMERMAN,DONIELLE MPA-C 02/29/2012,7:35 AM   I have seen and examined Selena Taylor and agree with the above assessment  and plan.  Delight Ovens MD Beeper [phone] Office [phone] 02/29/2012 1:49 PM

## 2012-02-29 NOTE — Discharge Summary (Signed)
ELECTROPHYSIOLOGY DISCHARGE SUMMARY    Patient ID: Selena Taylor,  MRN: [id number], DOB/AGE: 1935/01/31 77 y.o.  Admit date: 02/23/2012 Discharge date: 02/29/2012  Primary Care Physician: Illene Regulus, MD Primary Cardiologist: Berton Mount, MD  Primary Discharge Diagnosis:  1. Large pericardial effusion s/p pericardial window 02/25/2012 2. Pericarditis 3. Atrial fibrillation 4. Acute blood loss anemia  Secondary Discharge Diagnoses:  1. HTN 2. Chronic diastolic HF 3. Coagulopathy secondary to chronic Coumadin therapy 4. Prior TIA 5. Anxiety  Procedures This Admission:  1. Subxiphoid pericardial window with drainage of pericardial fluid and pericardial biopsy  History and Hospital Course:  Selena Taylor is a 77 year old woman with PAF and HTN who has been hospitalized twice since 02/05/2012 for persistent pericarditis with pericardial effusion and diastolic HF. She was discharged home recently on 02/22/2012, scheduled for close outpatient follow-up. However, she was readmitted on 02/23/2012 with worsening SOB and chest pain. CT surgery saw her during her previous admission and felt she did not need a pericardial window since the effusion appeared stable on echo and she was hemodyanically stable without evidence of tamponade. On admission 02/23/2012, chest CT noted interval increase in the size of her effusion, noted to be slight. Given this finding, CT surgery was re-consulted regarding her persistent large effusion. It was felt she would benefit from pericardial window both for diagnostic evaluation and symptomatic relief. She underwent subxiphoid pericardial window with drainage and pericardial biopsy on 02/25/2012. She tolerated this procedure well without complication. The pericardial drain was removed 02/27/2012. She remains hemodynamically stable and afebrile. She remains in atrial fibrillation with adequate rate control. She will continue amiodarone and diltiazem. Her Coumadin had been  held since her previous admission. Dr. Graciela Husbands discussed options for anticoagulation with Selena Taylor. Her Coumadin was discontinued altogether and she will start Xarelto 20 mg once daily tomorrow, 03/01/2012. She has been seen, examined and deemed stable for discharge today by Dr. Berton Mount. She will continue Indomethacin 50 mg BID x 10 more days. She will continue colchicine 0.6 mg BID x 3 months. She will see Dr. Graciela Husbands next week 03/08/2012 for close follow-up. She will see Dr. Tyrone Sage on 03/16/2012.   Discharge Vitals: Blood pressure 115/80, pulse 95, temperature 97.8 F (36.6 C), temperature source Oral, resp. rate 18, height 5\' 2"  (1.575 m), weight 130 lb 3.2 oz (59.058 kg), SpO2 95.00%.   Labs: Lab Results  Component Value Date   WBC 8.0 02/27/2012   HGB 10.7* 02/27/2012   HCT 32.7* 02/27/2012   MCV 94.8 02/27/2012   PLT 414* 02/27/2012     Lab 02/27/12 0445  NA 134*  K 4.5  CL 102  CO2 22  BUN 9  CREATININE 0.67  CALCIUM 8.3*  PROT 6.0  BILITOT 0.2*  ALKPHOS 115  ALT 20  AST 17  GLUCOSE 115*   Lab Results  Component Value Date   CKTOTAL 69 05/02/2011   CKMB 2.3 05/02/2011   TROPONINI <0.30 02/15/2012     Basename 02/28/12 0645  INR 2.13*    Disposition:  The patient is being discharged in stable condition.  Follow-up:     Follow-up Information    Follow up with Delight Ovens, MD. On 03/16/2012. (At 12:45 PM - PA/LAT CXR to be taken (at [facility] which is in the same building as Dr. Dennie Maizes office); Appointment with Dr. Tyrone Sage will be following CXR at 1:45 PM)    Contact information:   [address] [place]  [address] [phone]      Follow up with Sherryl Manges, MD. On 03/08/2012. (At 12:15 PM)    Contact information:   [address] [place] [address] [phone]       Discharge Medications:    Medication List     As of 02/29/2012  9:17 AM    TAKE these medications         acetaminophen 325 MG tablet    Commonly known as: TYLENOL   Take 650 mg by mouth every 4 (four) hours as needed. For pain and inflammation      amiodarone 200 MG tablet   Commonly known as: PACERONE   Take 2 tablets (400 mg total) by mouth 2 (two) times daily. Take 2 tabs 2 x day for 2 weeks, then 2 tabs daily.      B-complex with vitamin C tablet   Take 1 tablet by mouth daily.      colchicine 0.6 MG tablet   Take 1 tablet (0.6 mg total) by mouth 2 (two) times daily.      diltiazem 240 MG 24 hr capsule   Commonly known as: DILACOR XR   Take 1 capsule (240 mg total) by mouth daily.      esomeprazole 40 MG capsule   Commonly known as: NEXIUM   Take 1 capsule (40 mg total) by mouth daily.      Fish Oil 1000 MG Caps   Take 1,000 mg by mouth daily.      furosemide 40 MG tablet   Commonly known as: LASIX   Take 1 tablet (40 mg total) by mouth 2 (two) times daily.      Grape Seed 100 MG Caps   Take 1 capsule by mouth daily.      indomethacin 50 MG capsule   Commonly known as: INDOCIN   Take 1 capsule (50 mg total) by mouth 2 (two) times daily with a meal. For 10 days then stop.      Rivaroxaban 20 MG Tabs   Commonly known as: XARELTO   Take 1 tablet (20 mg total) by mouth daily. START tomorrow, Wednesday 03/01/2012.      Vitamin D 1000 UNITS capsule   Take 1,000 Units by mouth daily.      zolpidem 10 MG tablet   Commonly known as: AMBIEN   Take 1 tablet (10 mg total) by mouth at bedtime. For sleep        Duration of Discharge Encounter: Greater than 30 minutes including physician time.  Signed, Rick Duff, PA-C 02/29/2012, 9:17 AM

## 2012-02-29 NOTE — Progress Notes (Signed)
   ELECTROPHYSIOLOGY ROUNDING NOTE    Patient Name: Selena Taylor Date of Encounter: 02-29-12    SUBJECTIVE:Patient feels well.  No chest pain or shortness of breath.  She is eager to go home.    S/p pericardial window this admission.  Drain removed 02-27-12.  Repeat echo demonstrated no effusion.    TELEMETRY: Reviewed telemetry pt in sinus rhythm with run of afib this morning.  Filed Vitals:   02/28/12 1121 02/28/12 1321 02/28/12 2014 02/29/12 0422  BP: 118/69 111/57 116/74 115/80  Pulse:  94 92 95  Temp:  98.2 F (36.8 C) 98.1 F (36.7 C) 97.8 F (36.6 C)  TempSrc:  Oral Oral Oral  Resp:  18 18 18   Height:      Weight:      SpO2:  94% 93% 95%    Intake/Output Summary (Last 24 hours) at 02/29/12 0636 Last data filed at 02/29/12 0424  Gross per 24 hour  Intake    720 ml  Output   1050 ml  Net   -330 ml    LABS: Basic Metabolic Panel:  Basename 02/27/12 0445  NA 134*  K 4.5  CL 102  CO2 22  GLUCOSE 115*  BUN 9  CREATININE 0.67  CALCIUM 8.3*  MG --  PHOS --   Liver Function Tests:  Basename 02/27/12 0445  AST 17  ALT 20  ALKPHOS 115  BILITOT 0.2*  PROT 6.0  ALBUMIN 2.1*   CBC:  Basename 02/27/12 0445  WBC 8.0  NEUTROABS --  HGB 10.7*  HCT 32.7*  MCV 94.8  PLT 414*    Radiology/Studies:  Dg Chest 2 View 02/24/2012  *RADIOLOGY REPORT*  Clinical Data: Preoperative radiographs  CHEST - 2 VIEW  Comparison: Prior chest x-ray 02/23/2012  Findings: No significant interval change in the appearance of the chest since the recent prior examination.  Persistent small bilateral pleural effusions, background changes of COPD and enlargement of the cardiopericardial silhouette.  IMPRESSION: 1.  No significant interval change since the recent prior chest x- ray. 2.  Persistent enlargement the cardiopericardial silhouette in this patient with a known pericardial effusion. 3.  Small bilateral effusions.   Original Report Authenticated By: Malachy Moan, M.D.     PHYSICAL EXAM Well developed and nourished in no acute distress HENT normal Neck supple with JVP-flat Clear Irregular  rate and rhythm, no murmurs or gallops Abd-soft with active BS No Clubbing cyanosis edema Skin-warm and dry A & Oriented  Grossly normal sensory and motor function   Office visit scheduled for 1 week with Dr Graciela Husbands.   Have discussed the use of Rivaroxaban as opposed to warfarin. She is agreeable. We have discussed risks and benefits including not more stability.  We'll continue her on Indocin for 10 days and colchicine for another 2 months. She is advised regarding GI side effects.

## 2012-02-29 NOTE — Progress Notes (Signed)
Pt currently resting in bed asleep. Went into Afib this morning, but HR controlled. Will continue to monitor.

## 2012-03-02 ENCOUNTER — Telehealth: Payer: Self-pay | Admitting: *Deleted

## 2012-03-02 NOTE — Telephone Encounter (Signed)
TCM patient. Discharged on 2/4 LM on machine for call back.

## 2012-03-02 NOTE — Telephone Encounter (Signed)
Patient called back with out any complaints. Has all medications. Aware of post hospital appointment on 2/12 with Dr.Klein.

## 2012-03-03 ENCOUNTER — Other Ambulatory Visit: Payer: Self-pay | Admitting: Nurse Practitioner

## 2012-03-03 ENCOUNTER — Telehealth: Payer: Self-pay | Admitting: Internal Medicine

## 2012-03-03 ENCOUNTER — Encounter: Payer: Medicare Other | Admitting: Internal Medicine

## 2012-03-03 NOTE — Telephone Encounter (Signed)
I spoke with the patient's son in law. The patient asked him to call and report that she has had diarrhea that started up shortly after her discharge on 2/4. She was up several times last night. Her coumadin was stopped in the hospital and she was placed on xarelto. I will review with the DOD and call him back.

## 2012-03-03 NOTE — Telephone Encounter (Signed)
Reviewed with Dr. Antoine Poche, continue current medications. I have made the patient's son in law aware. Recommended immodium for diarrhea in the interim. The patient has a follow up with Dr. Graciela Husbands on 2/12.

## 2012-03-03 NOTE — Telephone Encounter (Signed)
New problem    Medication was changed while in the hospital    C/O diarrhea . pcp was not contacted

## 2012-03-07 LAB — VIRAL CULTURE VIRC

## 2012-03-08 ENCOUNTER — Encounter: Payer: Medicare Other | Admitting: Internal Medicine

## 2012-03-13 ENCOUNTER — Encounter: Payer: Self-pay | Admitting: Internal Medicine

## 2012-03-13 ENCOUNTER — Telehealth: Payer: Self-pay | Admitting: Internal Medicine

## 2012-03-13 ENCOUNTER — Ambulatory Visit (INDEPENDENT_AMBULATORY_CARE_PROVIDER_SITE_OTHER): Payer: Medicare Other | Admitting: Internal Medicine

## 2012-03-13 VITALS — BP 124/68 | HR 74 | Ht 62.0 in | Wt 111.0 lb

## 2012-03-13 DIAGNOSIS — R9431 Abnormal electrocardiogram [ECG] [EKG]: Secondary | ICD-10-CM | POA: Insufficient documentation

## 2012-03-13 DIAGNOSIS — I4891 Unspecified atrial fibrillation: Secondary | ICD-10-CM

## 2012-03-13 DIAGNOSIS — I319 Disease of pericardium, unspecified: Secondary | ICD-10-CM

## 2012-03-13 DIAGNOSIS — R112 Nausea with vomiting, unspecified: Secondary | ICD-10-CM

## 2012-03-13 LAB — TROPONIN I: Troponin I: <0.3 ng/mL (ref <0.30)

## 2012-03-13 MED ORDER — COLCHICINE 0.6 MG PO TABS: 0.6000 mg | ORAL_TABLET | Freq: Every day | ORAL | Status: DC

## 2012-03-13 NOTE — Telephone Encounter (Signed)
I spoke with the patient's son. We will see her today at 3:15 pm with Dr. Graciela Husbands.

## 2012-03-13 NOTE — Patient Instructions (Addendum)
Your physician recommends that you schedule a follow-up appointment in: 3 weeks with Dr Graciela Husbands  Your physician has recommended you make the following change in your medication: HOLD Amiodarone for 1 week then resume and HOLD Colchicine for 48 hours then resume on a once daily routine

## 2012-03-13 NOTE — Telephone Encounter (Signed)
New Problem    Pt in the hospital for 2 weeks under Dr. Koren Bound care. Was supposed to follow up with Dr. Graciela Husbands 2/12 (cancelled). Would really like to see Dr. Graciela Husbands for follow up within 2 days. Would like to speak to  Nurse.

## 2012-03-13 NOTE — Assessment & Plan Note (Signed)
Assessment of this is related to her medications although the abnormal ECG is concerning. We'll check a troponin of medications

## 2012-03-13 NOTE — Assessment & Plan Note (Signed)
Holding sinus rhythm. Because of the GI symptoms we will hold the amiodarone for now

## 2012-03-13 NOTE — Progress Notes (Signed)
Patient Care Team: Jacques Navy, MD as PCP - General   HPI  Selena Taylor is a 77 y.o. female Seen in followup for atrial fibrillation AND PERSISTENT PERICARDITIS. bECAUSE OF PERSISTENT SYMPTOMS, SHE UNDERWENT A PERICARDIAL WINDOW a couple of weeks ago. aTRIAL FIBRILLATION PERSISTED with improved rate control.  She was discharged on Indocin and twice a day colchicine for her pericarditis. She comes in today with significant GI symptoms including nausea vomitingand diarrhea. She has been losing weight and she's not been able to eat  Past Medical History  Diagnosis Date  . TIA (transient ischemic attack)   . PAF (paroxysmal atrial fibrillation)     has been on Flecainide in the past. Stopped 02/10/11; Remains on coumadin anticoagulation; intol to Rythmol and failed DCCV; noted to be in AFlutter 5/13;  Echo 01/2011:  EF 55-60%, mild MR, mild LAE.  Myoview 6/12:  No ischemia, EF 74%  . HTN (hypertension)   . Endometriosis   . Osteopenia   . Insomnia   . Chronic anticoagulation     on coumadin  . Anxiety   . Pericardial effusion   . Shortness of breath   . Pericardial effusion 02/23/2012  . CHF (congestive heart failure)     Past Surgical History  Procedure Laterality Date  . Polypectomy    . Total hysterectomy and bilateral salpingoopherectomy    . Appendectomy    . Skin cancer removal    . Cardioversion  01/14/2011    Procedure: CARDIOVERSION;  Surgeon: Duke Salvia, MD;  Location: [facility] OR;  Service: Cardiovascular;  Laterality: N/A;  To be completed in Neuro OR 33 time slot 0830 12/20  . Cardioversion  05/27/2011    Procedure: CARDIOVERSION;  Surgeon: Duke Salvia, MD;  Location: [facility] OR;  Service: Cardiovascular;  Laterality: N/A;  . Pericardial window  02/25/2012    Procedure: PERICARDIAL WINDOW;  Surgeon: Delight Ovens, MD;  Location: [facility] OR;  Service: Thoracic;  Laterality: N/A;  . Tee without cardioversion  02/25/2012    Procedure: TRANSESOPHAGEAL ECHOCARDIOGRAM (TEE);   Surgeon: Delight Ovens, MD;  Location: [facility] OR;  Service: Thoracic;  Laterality: N/A;    Current Outpatient Prescriptions  Medication Sig Dispense Refill  . amiodarone (PACERONE) 200 MG tablet Take 400 mg by mouth daily.       . colchicine 0.6 MG tablet Take 1 tablet (0.6 mg total) by mouth 2 (two) times daily.  60 tablet  3  . diltiazem (DILACOR XR) 240 MG 24 hr capsule Take 1 capsule (240 mg total) by mouth daily.  30 capsule  11  . esomeprazole (NEXIUM) 40 MG capsule Take 1 capsule (40 mg total) by mouth daily.  30 capsule  3  . furosemide (LASIX) 40 MG tablet Take 1 tablet (40 mg total) by mouth 2 (two) times daily.  30 tablet  11  . Grape Seed 100 MG CAPS Take 1 capsule by mouth daily.       . Omega-3 Fatty Acids (FISH OIL) 1000 MG CAPS Take 1,000 mg by mouth daily.       . ramipril (ALTACE) 10 MG capsule TAKE 1 CAPSULE BY MOUTH EVERY DAY  30 capsule  5  . Rivaroxaban (XARELTO) 20 MG TABS Take 1 tablet (20 mg total) by mouth daily. START tomorrow, Wednesday 03/01/2012.  30 tablet  3  . zolpidem (AMBIEN) 10 MG tablet Take 1 tablet (10 mg total) by mouth at bedtime. For sleep  30 tablet  5  No current facility-administered medications for this visit.    Allergies  Allergen Reactions  . Other Other (See Comments)    Novocaine.  REACTION:  Rapid heart rate  . Procaine Hcl     Rapid heart rate    Review of Systems negative except from HPI and PMH  Physical Exam BP 124/68  Pulse 74  Ht 5\' 2"  (1.575 m)  Wt 111 lb (50.349 kg)  BMI 20.3 kg/m2 Well developed and well nourished in no acute distress HENT normal E scleral and icterus clear Neck Supple JVP flat; carotids brisk and full Clear to ausculation  Regular rate and rhythm, no murmurs gallops or rub Soft with active bowel sounds No clubbing cyanosis none Edema Alert and oriented, grossly normal motor and sensory function Skin Warm and Dry  Sinus rhythm at 75 Biatrial enlargement Intervals 15/08/45 Diffuse ST  segment depression in QT prolongation These findings are new since onFebruary 1 2014 Assessment and  Plan

## 2012-03-13 NOTE — Assessment & Plan Note (Signed)
Diffuse echocardiographic abnormalities suggestive of ischemia with new biatrial enlargement. I am not sure the mechanism of this. She has undergone window. She has not had this visit she does have diffuse GI symptoms. I suspect his symptoms are related to drugs although history could be manifestation of ischemia. We'll check a troponin.

## 2012-03-13 NOTE — Assessment & Plan Note (Signed)
Status post window. The GI symptoms may be related to twice a day colchicine. We will hold it for a couple of days and he is not at all. And then plan to resume it once a day if we can.

## 2012-03-14 ENCOUNTER — Other Ambulatory Visit: Payer: Self-pay | Admitting: *Deleted

## 2012-03-14 DIAGNOSIS — I319 Disease of pericardium, unspecified: Secondary | ICD-10-CM

## 2012-03-15 ENCOUNTER — Telehealth: Payer: Self-pay | Admitting: Internal Medicine

## 2012-03-15 ENCOUNTER — Encounter: Payer: Medicare Other | Admitting: Physician Assistant

## 2012-03-15 NOTE — Telephone Encounter (Signed)
I spoke with the patient's daughter. She states the patient is some better and is able to get down a little ensure, but she is still having dry heaves. Her colchicine was on hold until tomorrow. Reviewed with Dr. Graciela Husbands. Continue to hold colchicine until Monday and call with how she is doing. I have relayed this to the patient's daughter. They had not held amiodarone as instructed. They will do this starting today along with the colchicine. They will follow up by phone on Monday.

## 2012-03-15 NOTE — Telephone Encounter (Signed)
Pt daughter calling to give update on patient if not at number listed above please call [phone] it would be best for you to call between 11a and 1pm because after that she will be checking on her mother

## 2012-03-16 ENCOUNTER — Ambulatory Visit (INDEPENDENT_AMBULATORY_CARE_PROVIDER_SITE_OTHER): Payer: Self-pay | Admitting: Cardiothoracic Surgery

## 2012-03-16 ENCOUNTER — Ambulatory Visit
Admission: RE | Admit: 2012-03-16 | Discharge: 2012-03-16 | Disposition: A | Discharge status: Home / Self Care | Payer: Medicare Other | Source: Ambulatory Visit | Attending: Cardiothoracic Surgery | Admitting: Cardiothoracic Surgery

## 2012-03-16 ENCOUNTER — Encounter: Payer: Self-pay | Admitting: Cardiothoracic Surgery

## 2012-03-16 VITALS — BP 122/64 | HR 65 | Resp 20 | Ht 62.0 in | Wt 111.0 lb

## 2012-03-16 DIAGNOSIS — I313 Pericardial effusion (noninflammatory): Secondary | ICD-10-CM

## 2012-03-16 DIAGNOSIS — I319 Disease of pericardium, unspecified: Secondary | ICD-10-CM

## 2012-03-16 DIAGNOSIS — Z09 Encounter for follow-up examination after completed treatment for conditions other than malignant neoplasm: Secondary | ICD-10-CM

## 2012-03-16 IMAGING — CR DG CHEST 2V
2 series · 2 of 2 positions shown · non-contrast
Comparison: Radiographs [DATE].  CT [DATE].

CLINICAL DATA: Pericarditis status post pericardial window
[DATE].  Weakness.

CHEST - 2 VIEW

[w chest lat]
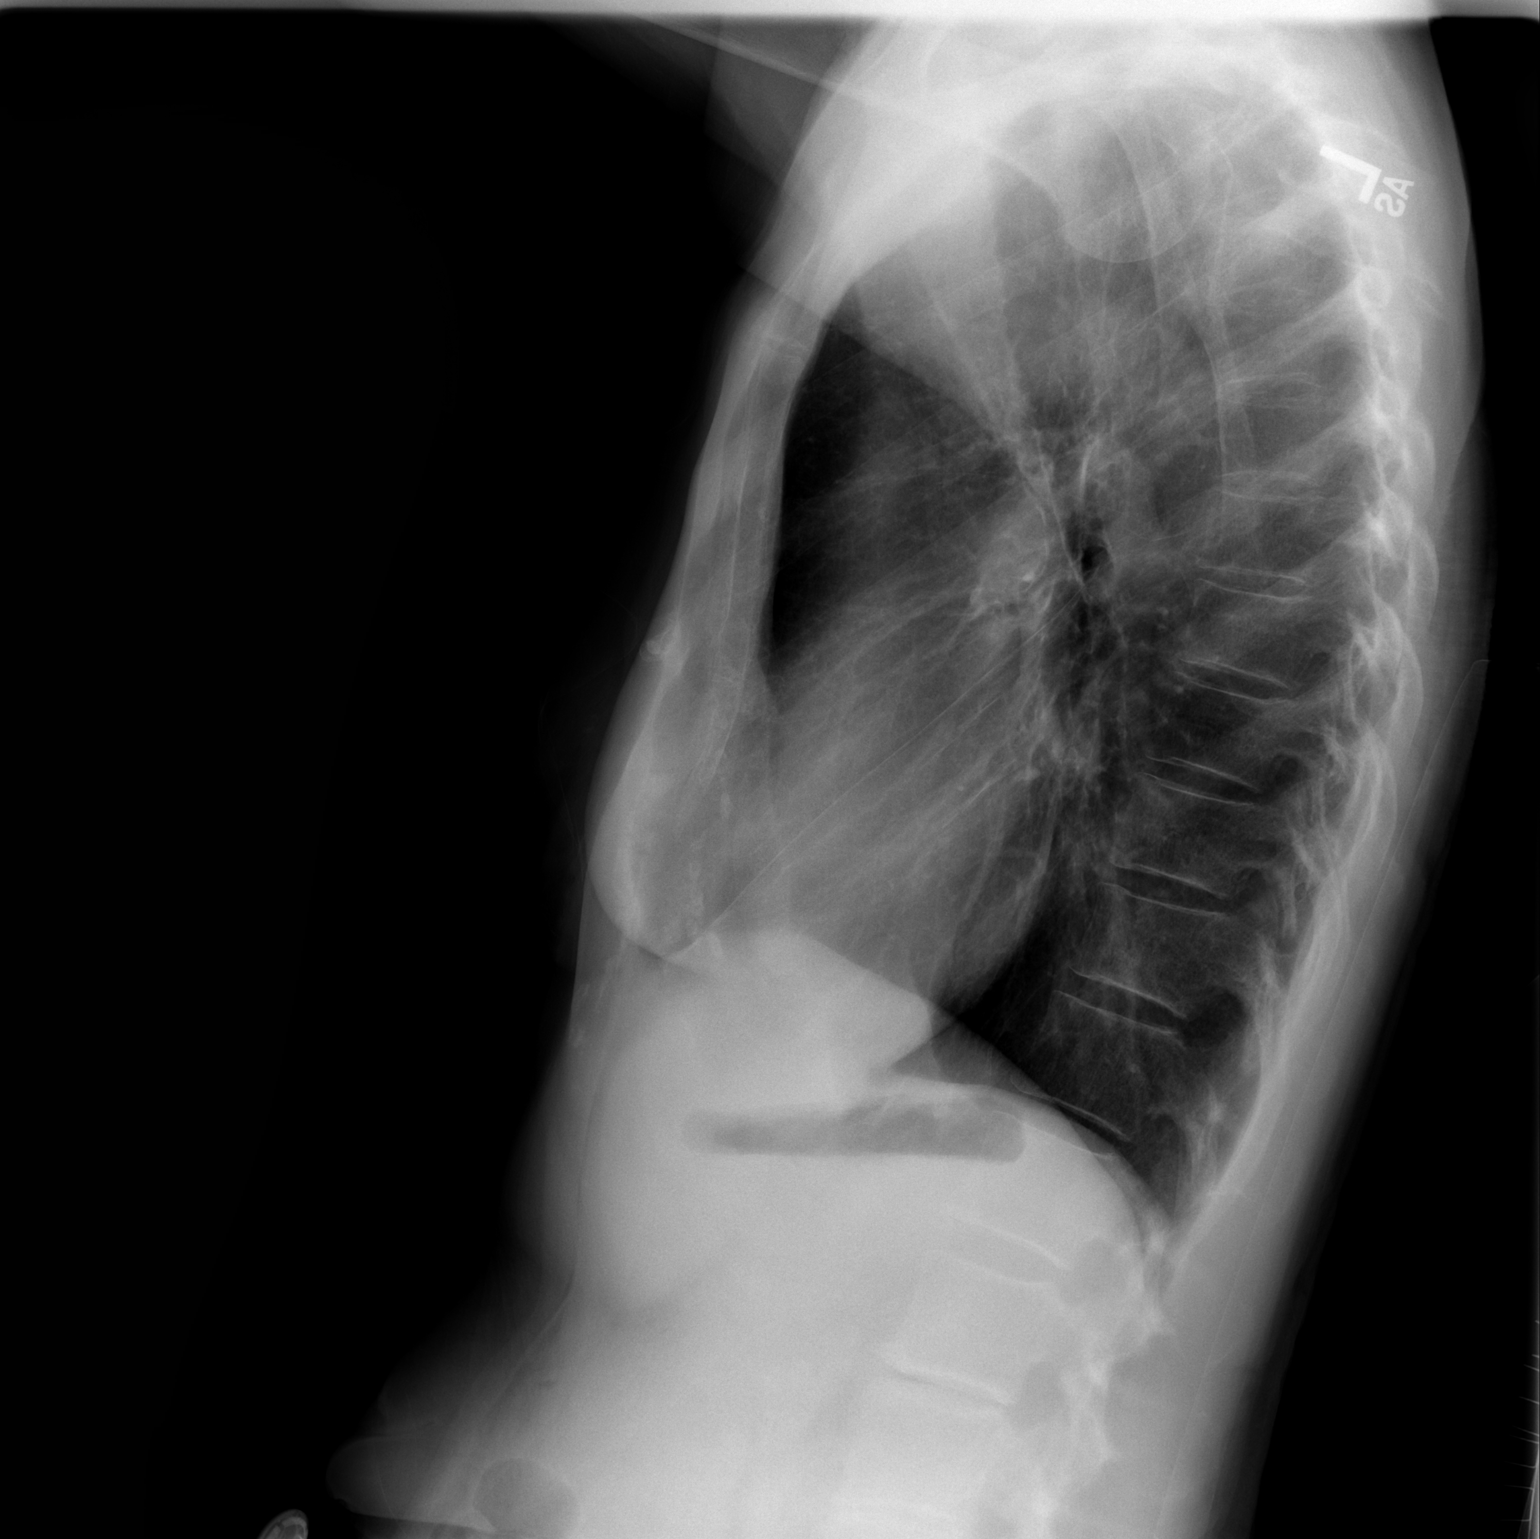

[w chest ap]
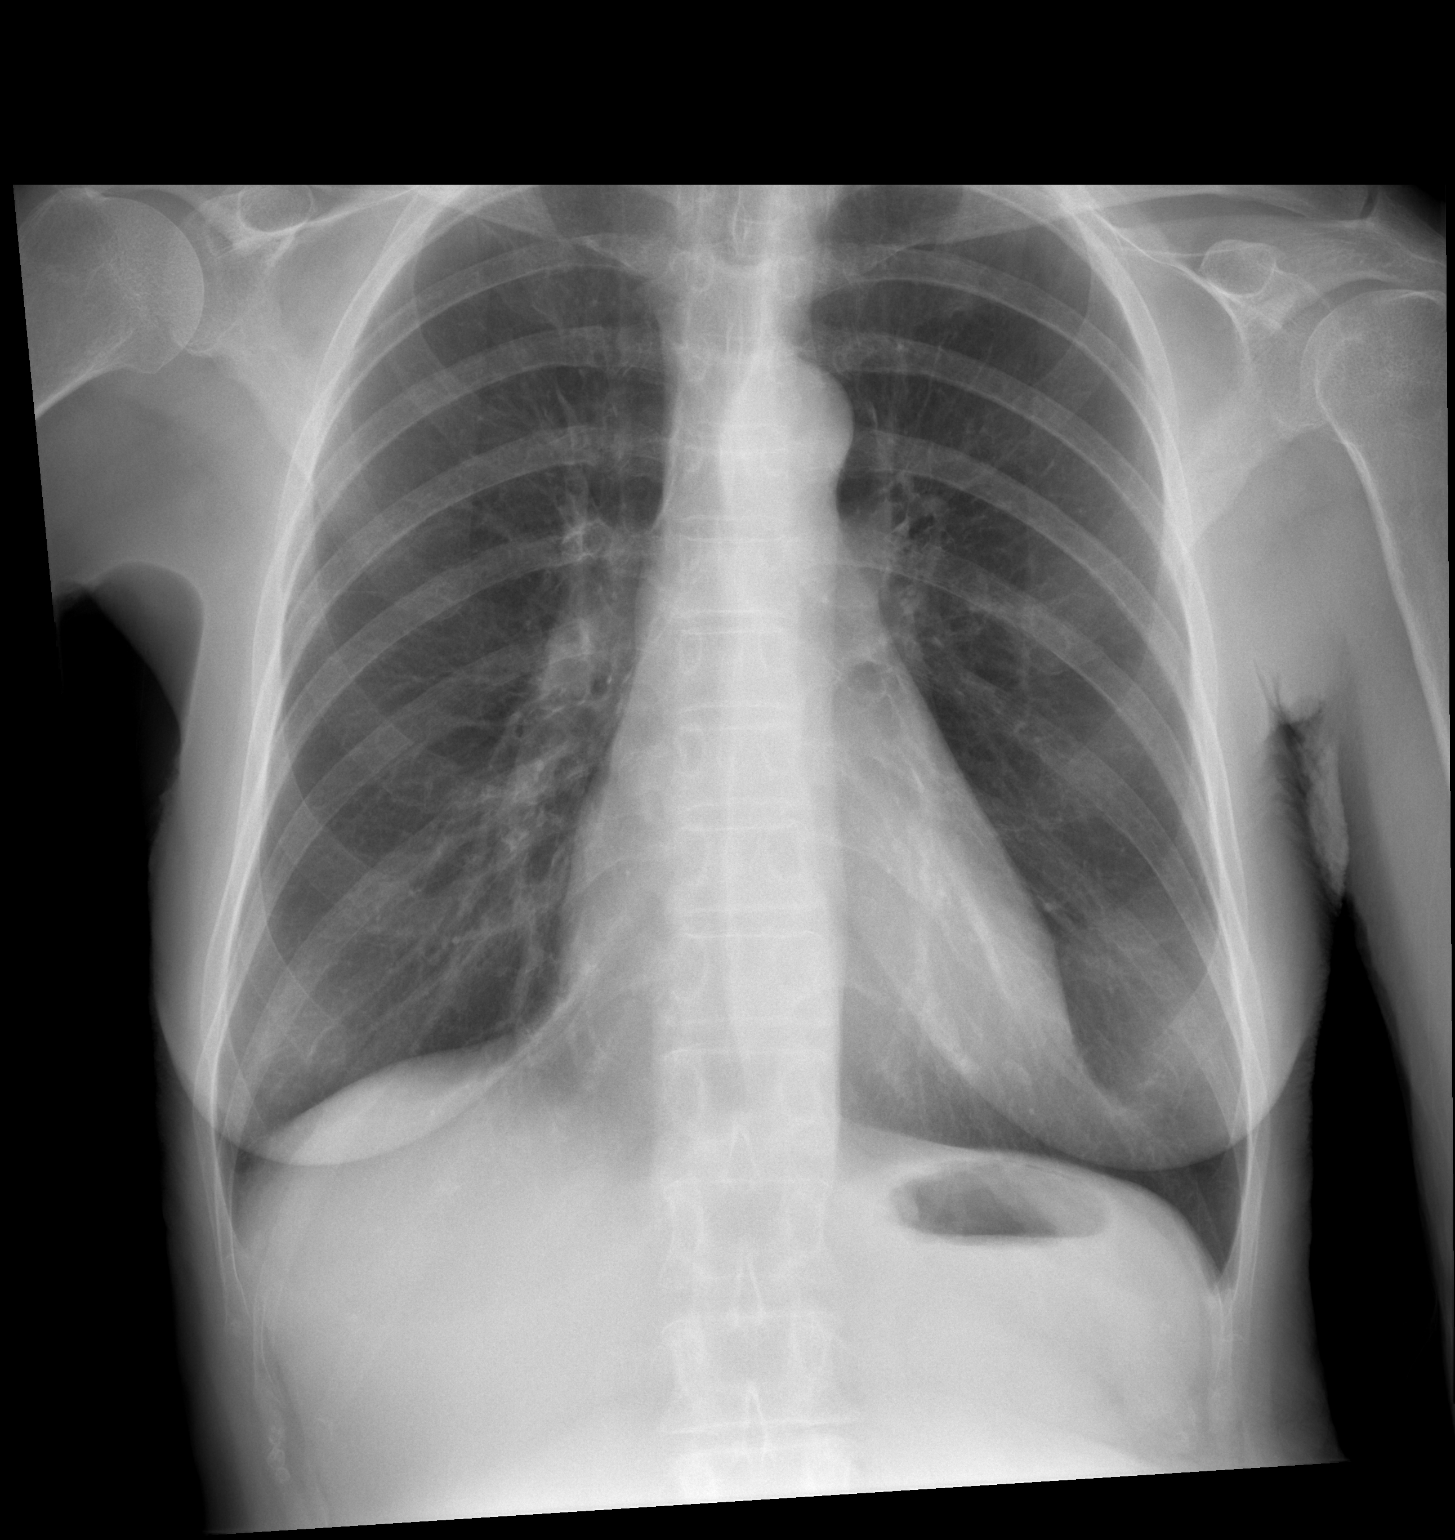

[2 of 2 positions shown; findings below may reference images not displayed]

FINDINGS: Central line has been removed in the interval.  The heart
size is stable and similar to baseline examination of [DATE].
The bilateral pleural effusions have resolved.  The lungs are now
clear.  There is no pneumothorax.
IMPRESSION: Resolved pleural effusions and bibasilar atelectasis. No acute
cardiopulmonary process.

## 2012-03-16 NOTE — Progress Notes (Addendum)
[address]            [place] [address]          [phone]       [facility] Medical Record #[id number] Date of Birth: 1935-04-17  Referred by Dr Netty Starring, MD  Chief Complaint:   PostOp Follow Up Visit    OPERATIVE Procedure 02/25/2012  PREOPERATIVE DIAGNOSIS: Pericardial effusion, question of pericarditis.  POSTOPERATIVE DIAGNOSIS: Pericardial effusion, question of  pericarditis.  SURGICAL PROCEDURE: Subxiphoid pericardial drainage with pericardial  biopsy and subxiphoid window and TEE.   1. Pericardium, biopsy - FIBROTIC TISSUE WITH ASSOCIATED CHRONIC INFLAMMATION AND FIBRIN EXUDATE. - NO EVIDENCE OF ATYPIA OR MALIGNANCY. 2. Pleura, peel, Pericardial - FIBROTIC TISSUE WITH ASSOCIATED CHRONIC INFLAMMATION AND FIBRIN EXUDATE. - NO EVIDENCE OF ATYPIA OR MALIGNANCY.   History of Present Illness:     Patient remains very weak following pericardial biopsy and drainage of small pericardial effusion. In addition to generalized fatigue and weakness she also has had GI complaints poor appetite nausea and some diarrhea. The colchicine and amiodarone have been temporarily held, she notes that this has helped with the diarrhea but still has poor intake.      History  Smoking status  . Never Smoker   Smokeless tobacco  . Never Used       Allergies  Allergen Reactions  . Other Other (See Comments)    Novocaine.  REACTION:  Rapid heart rate  . Procaine Hcl     Rapid heart rate    Current Outpatient Prescriptions  Medication Sig Dispense Refill  . amiodarone (PACERONE) 200 MG tablet Take 400 mg by mouth daily.       . colchicine 0.6 MG tablet Take 1 tablet (0.6 mg total) by mouth daily.  60 tablet  3  . diltiazem (DILACOR XR) 240 MG 24 hr capsule Take 1 capsule (240 mg total) by mouth daily.  30 capsule  11  . esomeprazole (NEXIUM) 40 MG capsule Take 1 capsule (40 mg total) by mouth daily.  30 capsule   3  . furosemide (LASIX) 40 MG tablet Take 1 tablet (40 mg total) by mouth 2 (two) times daily.  30 tablet  11  . Grape Seed 100 MG CAPS Take 1 capsule by mouth daily.       . Omega-3 Fatty Acids (FISH OIL) 1000 MG CAPS Take 1,000 mg by mouth daily.       . ramipril (ALTACE) 10 MG capsule TAKE 1 CAPSULE BY MOUTH EVERY DAY  30 capsule  5  . Rivaroxaban (XARELTO) 20 MG TABS Take 1 tablet (20 mg total) by mouth daily. START tomorrow, Wednesday 03/01/2012.  30 tablet  3  . zolpidem (AMBIEN) 10 MG tablet Take 1 tablet (10 mg total) by mouth at bedtime. For sleep  30 tablet  5   No current facility-administered medications for this visit.       Physical Exam: Ht 5\' 2"  (1.575 m)  Wt 111 lb (50.349 kg)  BMI 20.3 kg/m2  SpO2 %  General appearance: alert, cooperative, cachectic, fatigued and no distress Neurologic: intact Heart: regular rate and rhythm, S1, S2 normal, no murmur, click, rub or gallop Lungs: clear to auscultation bilaterally Abdomen: soft, non-tender; bowel sounds normal; no masses,  no organomegaly Extremities: extremities normal, atraumatic, no cyanosis or edema and Homans sign is  negative, no sign of DVT Wound: The incision over the xiphoid is well-healed,  the ccheestt tube suture was removed   Diagnostic Studies & Laboratory data:         Recent Radiology Findings: Dg Chest 2 View  03/16/2012  *RADIOLOGY REPORT*  Clinical Data: Pericarditis status post pericardial window 02/25/2012.  Weakness.  CHEST - 2 VIEW  Comparison: Radiographs 02/29/2012.  CT 02/24/2012.  Findings: Central line has been removed in the interval.  The heart size is stable and similar to baseline examination of 05/01/2011. The bilateral pleural effusions have resolved.  The lungs are now clear.  There is no pneumothorax.  IMPRESSION: Resolved pleural effusions and bibasilar atelectasis. No acute cardiopulmonary process.   Original Report Authenticated By: Carey Bullocks, M.D.     There's been no  significant improvement in the pleural effusions, the cardiac silhouette is back to baseline  Recent Labs: Lab Results  Component Value Date   WBC 8.0 02/27/2012   HGB 10.7* 02/27/2012   HCT 32.7* 02/27/2012   PLT 414* 02/27/2012   GLUCOSE 115* 02/27/2012   CHOL 125 02/06/2012   TRIG 40 02/06/2012   HDL 91 02/06/2012   LDLCALC 26 02/06/2012   ALT 20 02/27/2012   AST 17 02/27/2012   NA 134* 02/27/2012   K 4.5 02/27/2012   CL 102 02/27/2012   CREATININE 0.67 02/27/2012   BUN 9 02/27/2012   CO2 22 02/27/2012   TSH 1.196 05/02/2011   INR 2.03* 02/29/2012      Assessment / Plan:   1  Patient status post drainage pericardial effusion and pericardial biopsy which was primarily positive for extensive inflammatory process of unknown etiology question autoimmune 2 probably GI related complaints secondary to a combination of colchicine and amiodarone I encouraged the patient's chest x-ray is improved including clearing of the pleural effusions. Patient will discuss with Dr. Clide Cliff a followup with rheumatology, she is to report back to Dr. Wilburn Mylar office in several days if her by mouth intake has not improved with the current medication changes We'll plan to see her back in 3 weeks       Darolyn Double B 03/16/2012 1:42 PM

## 2012-03-16 NOTE — Patient Instructions (Signed)
Try to eat more See Dr Clide Cliff next week

## 2012-03-20 ENCOUNTER — Telehealth: Payer: Self-pay | Admitting: Internal Medicine

## 2012-03-20 DIAGNOSIS — I319 Disease of pericardium, unspecified: Secondary | ICD-10-CM

## 2012-03-20 DIAGNOSIS — M791 Myalgia, unspecified site: Secondary | ICD-10-CM

## 2012-03-20 NOTE — Telephone Encounter (Signed)
They need the information regarding the [place] authorities Dr. Graciela Husbands told them they would refer the patient to

## 2012-03-20 NOTE — Telephone Encounter (Signed)
Pt's daughter called regarding a rheumatoid arthritis doctor referral. Daughter states when pt was seen by Dr Tyrone Sage MD thoracic surgeon,the  Surgeon did a pericardial biopsy and drainage. The surgeon told pt that the biopsy results was not cancer that it was inflammation so, Dr. Graciela Husbands needed to referred pt to a rheumatologist. Pt prefers for Dr. Graciela Husbands to referred her instead of the PCP.

## 2012-03-20 NOTE — Telephone Encounter (Signed)
Dr Debby Bud and I discussed this, and he was going to make the referral thanks steve

## 2012-03-21 NOTE — Telephone Encounter (Signed)
Pt's daughter is aware of referral to Dr. Azzie Roup. Rheumatology,s office will call pt to make the appointment.

## 2012-03-21 NOTE — Telephone Encounter (Signed)
Referral made to Dr. Azzie Roup, marked as urgent.

## 2012-03-24 LAB — FUNGUS CULTURE W SMEAR: Fungal Smear: NONE SEEN

## 2012-04-04 ENCOUNTER — Ambulatory Visit (INDEPENDENT_AMBULATORY_CARE_PROVIDER_SITE_OTHER): Payer: Medicare Other | Admitting: Internal Medicine

## 2012-04-04 VITALS — BP 112/61 | HR 70 | Ht 62.0 in | Wt 117.1 lb

## 2012-04-04 DIAGNOSIS — R259 Unspecified abnormal involuntary movements: Secondary | ICD-10-CM

## 2012-04-04 DIAGNOSIS — I4891 Unspecified atrial fibrillation: Secondary | ICD-10-CM

## 2012-04-04 DIAGNOSIS — R251 Tremor, unspecified: Secondary | ICD-10-CM

## 2012-04-04 DIAGNOSIS — I319 Disease of pericardium, unspecified: Secondary | ICD-10-CM

## 2012-04-04 MED ORDER — PROPRANOLOL HCL 10 MG PO TABS: ORAL_TABLET | ORAL | Status: DC

## 2012-04-04 NOTE — Assessment & Plan Note (Addendum)
This seems to have resolved clinically. Hopefully will not recur.  She is to see a rheumatologist in the next couple of weeks. We await the consultation.

## 2012-04-04 NOTE — Progress Notes (Signed)
Patient Care Team: Jacques Navy, MD as PCP - General   HPI  Selena Taylor is a 77 y.o. female Seen in followup for atrial fibrillation and persistent pericarditis .Because of persistent symptoms, she underwent a pericardial window  Atrial fibrillatino persisted with improved rate control   She was discharged on Indocin and twice a day colchicine for her pericarditis. She developed significant GI symptoms on twice daily colchicine. we ended up stopping the colchicine and she is much much much better. She is more like her old self.  She does complain however of her tremor. This has been long-standing and recurrent issue. It seems to be more problematic. Rivaroxaban in 0.5% has been associated with a tremor mostly in older women Past Medical History  Diagnosis Date  . TIA (transient ischemic attack)   . PAF (paroxysmal atrial fibrillation)     has been on Flecainide in the past. Stopped 02/10/11; Remains on coumadin anticoagulation; intol to Rythmol and failed DCCV; noted to be in AFlutter 5/13;  Echo 01/2011:  EF 55-60%, mild MR, mild LAE.  Myoview 6/12:  No ischemia, EF 74%  . HTN (hypertension)   . Endometriosis   . Osteopenia   . Insomnia   . Chronic anticoagulation     on coumadin  . Anxiety   . Pericardial effusion   . Shortness of breath   . Pericardial effusion 02/23/2012  . CHF (congestive heart failure)     Past Surgical History  Procedure Laterality Date  . Polypectomy    . Total hysterectomy and bilateral salpingoopherectomy    . Appendectomy    . Skin cancer removal    . Cardioversion  01/14/2011    Procedure: CARDIOVERSION;  Surgeon: Duke Salvia, MD;  Location: [facility] OR;  Service: Cardiovascular;  Laterality: N/A;  To be completed in Neuro OR 33 time slot 0830 12/20  . Cardioversion  05/27/2011    Procedure: CARDIOVERSION;  Surgeon: Duke Salvia, MD;  Location: [facility] OR;  Service: Cardiovascular;  Laterality: N/A;  . Pericardial window  02/25/2012    Procedure:  PERICARDIAL WINDOW;  Surgeon: Delight Ovens, MD;  Location: [facility] OR;  Service: Thoracic;  Laterality: N/A;  . Tee without cardioversion  02/25/2012    Procedure: TRANSESOPHAGEAL ECHOCARDIOGRAM (TEE);  Surgeon: Delight Ovens, MD;  Location: [facility] OR;  Service: Thoracic;  Laterality: N/A;    Current Outpatient Prescriptions  Medication Sig Dispense Refill  . diltiazem (DILACOR XR) 240 MG 24 hr capsule Take 1 capsule (240 mg total) by mouth daily.  30 capsule  11  . esomeprazole (NEXIUM) 40 MG capsule Take 1 capsule (40 mg total) by mouth daily.  30 capsule  3  . furosemide (LASIX) 40 MG tablet Take 1 tablet (40 mg total) by mouth 2 (two) times daily.  30 tablet  11  . Grape Seed 100 MG CAPS Take 1 capsule by mouth daily.       . Omega-3 Fatty Acids (FISH OIL) 1000 MG CAPS Take 1,000 mg by mouth daily.       . ramipril (ALTACE) 10 MG capsule TAKE 1 CAPSULE BY MOUTH EVERY DAY  30 capsule  5  . Rivaroxaban (XARELTO) 20 MG TABS Take 1 tablet (20 mg total) by mouth daily. START tomorrow, Wednesday 03/01/2012.  30 tablet  3  . zolpidem (AMBIEN) 10 MG tablet Take 1 tablet (10 mg total) by mouth at bedtime. For sleep  30 tablet  5   No current facility-administered medications for  this visit.    Allergies  Allergen Reactions  . Other Other (See Comments)    Novocaine.  REACTION:  Rapid heart rate  . Procaine Hcl     Rapid heart rate    Review of Systems negative except from HPI and PMH  Physical Exam BP 112/61  Pulse 70  Ht 5\' 2"  (1.575 m)  Wt 117 lb 1.9 oz (53.125 kg)  BMI 21.42 kg/m2 Well developed and well nourished in no acute distress HENT normal E scleral and icterus clear Neck Supple JVP flat; carotids brisk and full Clear to ausculation  Regular rate and rhythm, no murmurs gallops or rub Soft with active bowel sounds No clubbing cyanosis none Edema Alert and oriented, grossly normal motor and sensory function Mild tremor Skin Warm and Dry

## 2012-04-04 NOTE — Assessment & Plan Note (Signed)
As above.

## 2012-04-04 NOTE — Patient Instructions (Addendum)
Your physician has recommended you make the following change in your medication:  1) Take inderal (propranolol) 10 mg one tablet by mouth as needed  Your physician recommends that you schedule a follow-up appointment in: 3 months with Dr. Graciela Husbands.

## 2012-04-04 NOTE — Assessment & Plan Note (Signed)
Holding sinus rhythm. She takes Rivaroxaban for anticoagulation. She is complaining of a tremor. This is long-standing but seems to be worse. She had been on beta blockers which may be masking. Review from the FDA also describes a 0.5% complaint as relates to tremor.

## 2012-04-05 ENCOUNTER — Encounter: Payer: Self-pay | Admitting: Cardiothoracic Surgery

## 2012-04-05 ENCOUNTER — Ambulatory Visit (INDEPENDENT_AMBULATORY_CARE_PROVIDER_SITE_OTHER): Payer: Self-pay | Admitting: Cardiothoracic Surgery

## 2012-04-05 ENCOUNTER — Ambulatory Visit
Admission: RE | Admit: 2012-04-05 | Discharge: 2012-04-05 | Disposition: A | Discharge status: Home / Self Care | Payer: Medicare Other | Source: Ambulatory Visit | Attending: Cardiothoracic Surgery | Admitting: Cardiothoracic Surgery

## 2012-04-05 VITALS — BP 96/63 | HR 77 | Resp 20 | Ht 62.0 in | Wt 117.0 lb

## 2012-04-05 DIAGNOSIS — I313 Pericardial effusion (noninflammatory): Secondary | ICD-10-CM

## 2012-04-05 DIAGNOSIS — I319 Disease of pericardium, unspecified: Secondary | ICD-10-CM

## 2012-04-05 DIAGNOSIS — Z09 Encounter for follow-up examination after completed treatment for conditions other than malignant neoplasm: Secondary | ICD-10-CM

## 2012-04-05 IMAGING — CR DG CHEST 2V
2 series · 2 of 2 positions shown · non-contrast
Comparison: Chest x-ray of [DATE]

CLINICAL DATA: History of pericardial window 2 weeks ago, follow-
up, some chest congestion and

CHEST - 2 VIEW

[w chest pa]
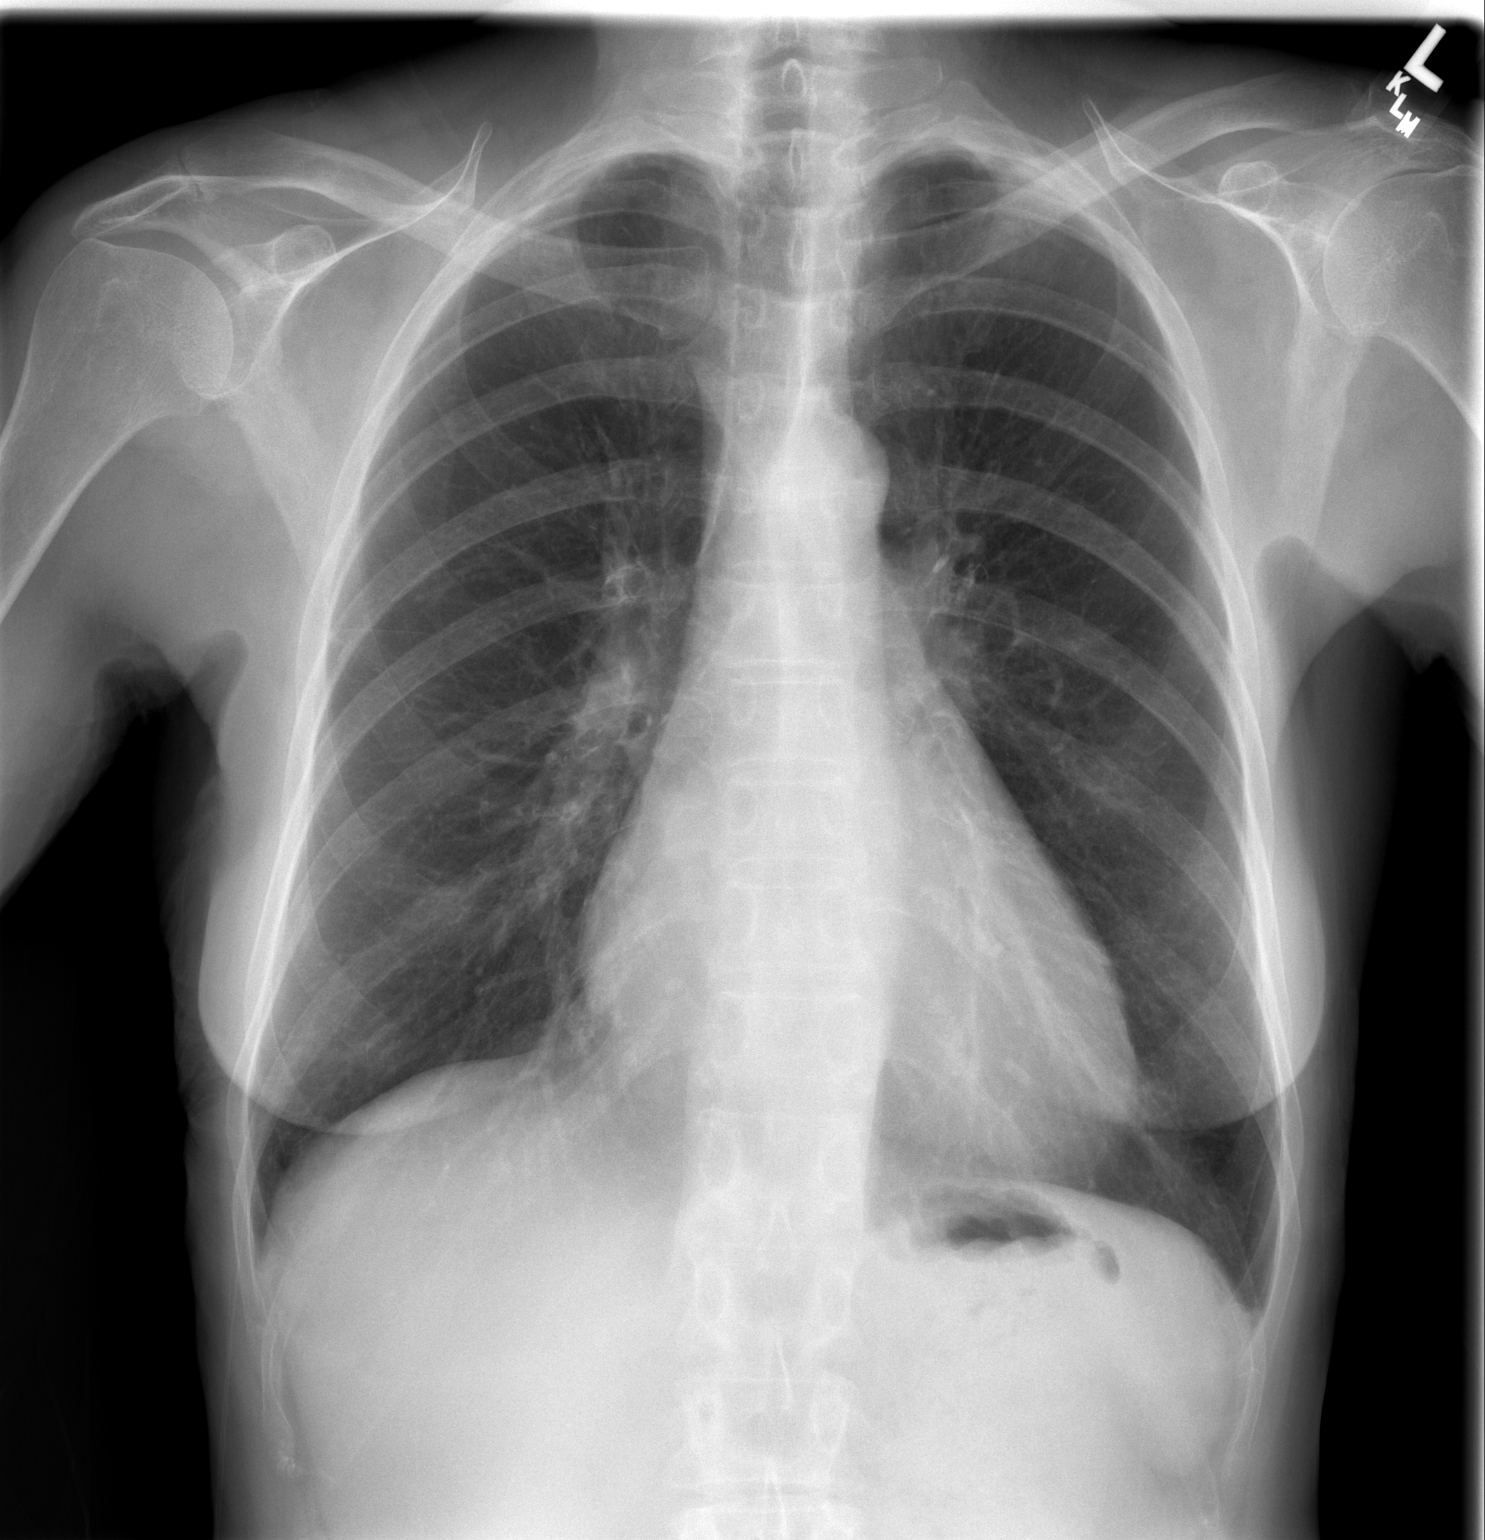

[w chest lat]
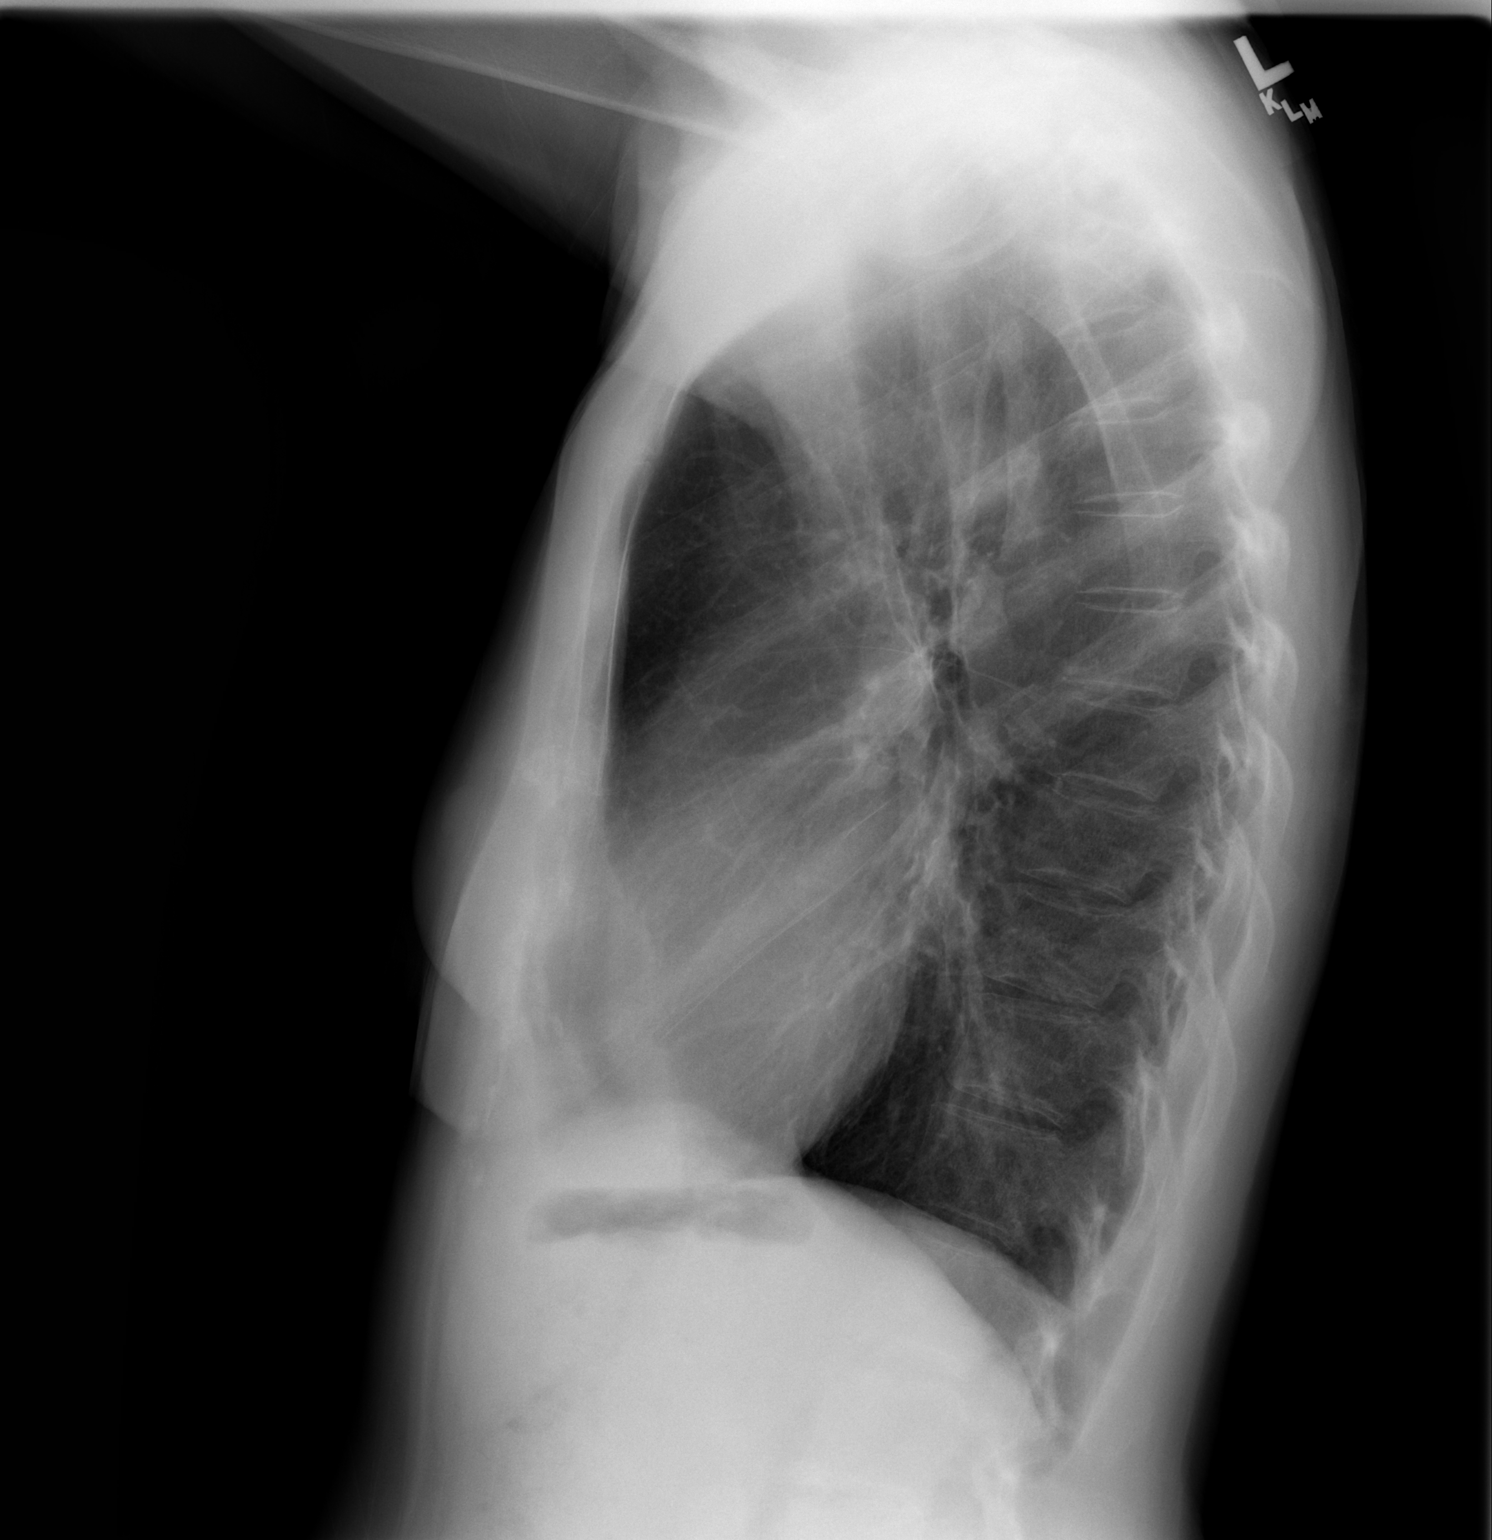

[2 of 2 positions shown; findings below may reference images not displayed]

FINDINGS: The lungs remain hyperaerated.  No infiltrate or effusion
is seen.  No pneumothorax is noted.  Heart size is stable.
IMPRESSION: No active lung disease.  No effusion.

## 2012-04-05 NOTE — Progress Notes (Signed)
[address]       [place] [address]             [phone]       Selena Taylor [facility] Medical Record #[id number] Date of Birth: 03/11/1935  Referred by Dr Netty Starring, MD  Chief Complaint:   PostOp Follow Up Visit    OPERATIVE Procedure 02/25/2012  PREOPERATIVE DIAGNOSIS: Pericardial effusion, question of pericarditis.  POSTOPERATIVE DIAGNOSIS: Pericardial effusion, question of  pericarditis.  SURGICAL PROCEDURE: Subxiphoid pericardial drainage with pericardial  biopsy and subxiphoid window and TEE.   1. Pericardium, biopsy - FIBROTIC TISSUE WITH ASSOCIATED CHRONIC INFLAMMATION AND FIBRIN EXUDATE. - NO EVIDENCE OF ATYPIA OR MALIGNANCY. 2. Pleura, peel, Pericardial - FIBROTIC TISSUE WITH ASSOCIATED CHRONIC INFLAMMATION AND FIBRIN EXUDATE. - NO EVIDENCE OF ATYPIA OR MALIGNANCY.   History of Present Illness:     Patient returns today following following pericardial biopsy and drainage of small pericardial effusion. The symptoms of generalized fatigue and weakness and GI complaints have markedly improved since her last visit. Her last visit she was in a wheelchair, she now comes to the office independent without any family members and notes "I feel great". She's had no fever chills no pedal edema. Her GI complaints improved after stopping the colchicine and Cordarone.  She is seeing Dr. Clide Cliff several days ago and was noted to be staying in sinus rhythm. She has a rheumatology appointment next week.    History  Smoking status  . Never Smoker   Smokeless tobacco  . Never Used       Allergies  Allergen Reactions  . Other Other (See Comments)    Novocaine.  REACTION:  Rapid heart rate  . Procaine Hcl     Rapid heart rate    Current Outpatient Prescriptions  Medication Sig Dispense Refill  . diltiazem (DILACOR XR) 240 MG 24 hr capsule Take 1 capsule (240 mg total) by mouth daily.  30 capsule  11  . esomeprazole  (NEXIUM) 40 MG capsule Take 1 capsule (40 mg total) by mouth daily.  30 capsule  3  . furosemide (LASIX) 40 MG tablet Take 1 tablet (40 mg total) by mouth 2 (two) times daily.  30 tablet  11  . Grape Seed 100 MG CAPS Take 1 capsule by mouth daily.       . Omega-3 Fatty Acids (FISH OIL) 1000 MG CAPS Take 1,000 mg by mouth daily.       . propranolol (INDERAL) 10 MG tablet Take one tablet by mouth as needed  60 tablet  2  . ramipril (ALTACE) 10 MG capsule TAKE 1 CAPSULE BY MOUTH EVERY DAY  30 capsule  5  . Rivaroxaban (XARELTO) 20 MG TABS Take 1 tablet (20 mg total) by mouth daily. START tomorrow, Wednesday 03/01/2012.  30 tablet  3  . zolpidem (AMBIEN) 10 MG tablet Take 1 tablet (10 mg total) by mouth at bedtime. For sleep  30 tablet  5   No current facility-administered medications for this visit.       Physical Exam: BP 96/63  Pulse 77  Resp 20  Ht 5\' 2"  (1.575 m)  Wt 117 lb (53.071 kg)  BMI 21.39 kg/m2  SpO2 97%  General appearance: alert, cooperative, cachectic, fatigued and no distress Neurologic: intact Heart: regular rate and rhythm, S1, S2 normal, no murmur, click, rub or gallop Lungs:  clear to auscultation bilaterally Abdomen: soft, non-tender; bowel sounds normal; no masses,  no organomegaly Extremities: extremities normal, atraumatic, no cyanosis or edema and Homans sign is negative, no sign of DVT Wound: The incision over the xiphoid is well-healed  Diagnostic Studies & Laboratory data:         Recent Radiology Findings: Dg Chest 2 View  04/05/2012  *RADIOLOGY REPORT*  Clinical Data: History of pericardial window 2 weeks ago, follow- up, some chest congestion and  CHEST - 2 VIEW  Comparison: Chest x-ray of 03/16/2012  Findings: The lungs remain hyperaerated.  No infiltrate or effusion is seen.  No pneumothorax is noted.  Heart size is stable.  IMPRESSION: No active lung disease.  No effusion.   Original Report Authenticated By: Dwyane Dee, M.D.     There's been no  significant improvement in the pleural effusions, the cardiac silhouette is back to baseline  Recent Labs: Lab Results  Component Value Date   WBC 8.0 02/27/2012   HGB 10.7* 02/27/2012   HCT 32.7* 02/27/2012   PLT 414* 02/27/2012   GLUCOSE 115* 02/27/2012   CHOL 125 02/06/2012   TRIG 40 02/06/2012   HDL 91 02/06/2012   LDLCALC 26 02/06/2012   ALT 20 02/27/2012   AST 17 02/27/2012   NA 134* 02/27/2012   K 4.5 02/27/2012   CL 102 02/27/2012   CREATININE 0.67 02/27/2012   BUN 9 02/27/2012   CO2 22 02/27/2012   TSH 1.196 05/02/2011   INR 2.03* 02/29/2012      Assessment / Plan:   1  Patient status post drainage pericardial effusion and pericardial biopsy which was primarily positive for extensive inflammatory process of unknown etiology question autoimmune  I am encouraged the patient's chest x-ray has improved without evidence of pleural effusions .  I've not made her return appointment to see me but would be glad to see her at cardiology or her requested at anytime.      Emery Binz B 04/05/2012 2:56 PM

## 2012-04-06 ENCOUNTER — Ambulatory Visit: Payer: Self-pay | Admitting: Cardiothoracic Surgery

## 2012-04-10 LAB — AFB CULTURE WITH SMEAR (NOT AT ARMC)

## 2012-04-11 ENCOUNTER — Ambulatory Visit: Payer: Self-pay | Admitting: Internal Medicine

## 2012-04-11 DIAGNOSIS — I4891 Unspecified atrial fibrillation: Secondary | ICD-10-CM

## 2012-04-11 DIAGNOSIS — G459 Transient cerebral ischemic attack, unspecified: Secondary | ICD-10-CM

## 2012-05-08 ENCOUNTER — Telehealth: Payer: Self-pay | Admitting: Internal Medicine

## 2012-05-08 NOTE — Telephone Encounter (Signed)
I left a message for the patient to call back and clarify what she is supposed to be taking.

## 2012-05-08 NOTE — Telephone Encounter (Signed)
New problem    Pt on an antibiotic for a bladder infection and is about to have a dental procedure & they are wanting her to start another antibiotic and pt wants to know if this is safe for her

## 2012-05-09 NOTE — Telephone Encounter (Signed)
Attempted to call patient again and left message.  

## 2012-06-08 ENCOUNTER — Other Ambulatory Visit: Payer: Self-pay | Admitting: Internal Medicine

## 2012-06-13 ENCOUNTER — Other Ambulatory Visit: Payer: Self-pay

## 2012-06-13 MED ORDER — ZOLPIDEM TARTRATE 10 MG PO TABS: 10.0000 mg | ORAL_TABLET | Freq: Every day | ORAL | Status: DC

## 2012-06-14 ENCOUNTER — Other Ambulatory Visit: Payer: Self-pay | Admitting: *Deleted

## 2012-06-14 MED ORDER — RIVAROXABAN 20 MG PO TABS: 20.0000 mg | ORAL_TABLET | Freq: Every day | ORAL | Status: DC

## 2012-07-05 ENCOUNTER — Telehealth: Payer: Self-pay | Admitting: Internal Medicine

## 2012-07-05 NOTE — Telephone Encounter (Deleted)
error 

## 2012-07-07 ENCOUNTER — Ambulatory Visit (INDEPENDENT_AMBULATORY_CARE_PROVIDER_SITE_OTHER): Payer: Medicare Other

## 2012-07-07 ENCOUNTER — Ambulatory Visit (INDEPENDENT_AMBULATORY_CARE_PROVIDER_SITE_OTHER): Payer: Medicare Other | Admitting: Internal Medicine

## 2012-07-07 VITALS — BP 106/72 | HR 72 | Ht 62.0 in | Wt 117.8 lb

## 2012-07-07 DIAGNOSIS — M791 Myalgia, unspecified site: Secondary | ICD-10-CM

## 2012-07-07 DIAGNOSIS — G459 Transient cerebral ischemic attack, unspecified: Secondary | ICD-10-CM

## 2012-07-07 DIAGNOSIS — IMO0001 Reserved for inherently not codable concepts without codable children: Secondary | ICD-10-CM

## 2012-07-07 DIAGNOSIS — I319 Disease of pericardium, unspecified: Secondary | ICD-10-CM

## 2012-07-07 DIAGNOSIS — I4891 Unspecified atrial fibrillation: Secondary | ICD-10-CM

## 2012-07-07 DIAGNOSIS — I10 Essential (primary) hypertension: Secondary | ICD-10-CM

## 2012-07-07 DIAGNOSIS — R251 Tremor, unspecified: Secondary | ICD-10-CM

## 2012-07-07 DIAGNOSIS — Z7901 Long term (current) use of anticoagulants: Secondary | ICD-10-CM

## 2012-07-07 DIAGNOSIS — R259 Unspecified abnormal involuntary movements: Secondary | ICD-10-CM

## 2012-07-07 LAB — CBC WITH DIFFERENTIAL/PLATELET
Basophils Absolute: 0 10*3/uL (ref 0.0–0.1)
Basophils Relative: 0.5 % (ref 0.0–3.0)
Eosinophils Absolute: 0.4 10*3/uL (ref 0.0–0.7)
Lymphocytes Relative: 27.1 % (ref 12.0–46.0)
MCHC: 33.3 g/dL (ref 30.0–36.0)
Neutrophils Relative %: 61.2 % (ref 43.0–77.0)
RBC: 3.9 Mil/uL (ref 3.87–5.11)
RDW: 14.3 % (ref 11.5–14.6)

## 2012-07-07 LAB — BASIC METABOLIC PANEL
Chloride: 105 mEq/L (ref 96–112)
Potassium: 5 mEq/L (ref 3.5–5.1)

## 2012-07-07 NOTE — Patient Instructions (Signed)

## 2012-07-07 NOTE — Assessment & Plan Note (Signed)
No significant recurrences Continue Rivaroxaban

## 2012-07-07 NOTE — Assessment & Plan Note (Signed)
Well controlled 

## 2012-07-07 NOTE — Assessment & Plan Note (Signed)
n As above

## 2012-07-07 NOTE — Assessment & Plan Note (Signed)
Intolerant of beta blockers Will have her ask dr Dareen Piano

## 2012-07-07 NOTE — Assessment & Plan Note (Signed)
No recurrent symptomatic pericarditis

## 2012-07-07 NOTE — Progress Notes (Addendum)
Pt was started on Xarelto 20mg  QD for atrial fibillation on 02/2012 by Dr Graciela Husbands.   Reviewed patients medication list.  Pt is not currently on any combined P-gp and strong CYP3A4 inhibitors/inducers (ketoconazole, traconazole, ritonavir, carbamazepine, phenytoin, rifampin, St. John's wort).  Reviewed labs.  SCr 1.1 , Weight 53.43 kg, CrCl-37.85 .  Dose is not appropriate based on CrCl.   Hgb and HCT 12.7/38.2 WNL on 07/07/12.  Pt called and advised to switch Xarelto from 20mg  daily to 15mg  daily.  4 boxes of Xarelto 15mg  tablets left at front desk for pt pick-up.  Pt verbalized understanding.  Rx sent to [facility].

## 2012-07-07 NOTE — Patient Instructions (Addendum)
Your physician wants you to follow-up in:  6 MONTHS WITH  DR KLEIN  You will receive a reminder letter in the mail two months in advance. If you don't receive a letter, please call our office to schedule the follow-up appointment. Your physician recommends that you continue on your current medications as directed. Please refer to the Current Medication list given to you today.  

## 2012-07-07 NOTE — Progress Notes (Signed)
Patient Care Team: Jacques Navy, MD as PCP - General   HPI  Selena Taylor is a 77 y.o. female Seen in followup for atrial fibrillation and pericarditis. Because of persistent symptoms she had undergone a pericardial window.  Efforts for anti-inflammatories were complicated by drug intolerance. She has had infrequent atrial fibrillation symptoms. She is having more inflammatory symptoms in the proximal muscles particularly in the low back and legs. She is scheduled to see her rheumatologist next week  There has been no significant chest pain or shortness of breath   Past Medical History  Diagnosis Date  . TIA (transient ischemic attack)   . PAF (paroxysmal atrial fibrillation)     has been on Flecainide in the past. Stopped 02/10/11; Remains on coumadin anticoagulation; intol to Rythmol and failed DCCV; noted to be in AFlutter 5/13;  Echo 01/2011:  EF 55-60%, mild MR, mild LAE.  Myoview 6/12:  No ischemia, EF 74%  . HTN (hypertension)   . Endometriosis   . Osteopenia   . Insomnia   . Chronic anticoagulation     on coumadin  . Anxiety   . Pericardial effusion   . Shortness of breath   . Pericardial effusion 02/23/2012  . CHF (congestive heart failure)     Past Surgical History  Procedure Laterality Date  . Polypectomy    . Total hysterectomy and bilateral salpingoopherectomy    . Appendectomy    . Skin cancer removal    . Cardioversion  01/14/2011    Procedure: CARDIOVERSION;  Surgeon: Duke Salvia, MD;  Location: [facility] OR;  Service: Cardiovascular;  Laterality: N/A;  To be completed in Neuro OR 33 time slot 0830 12/20  . Cardioversion  05/27/2011    Procedure: CARDIOVERSION;  Surgeon: Duke Salvia, MD;  Location: [facility] OR;  Service: Cardiovascular;  Laterality: N/A;  . Pericardial window  02/25/2012    Procedure: PERICARDIAL WINDOW;  Surgeon: Delight Ovens, MD;  Location: [facility] OR;  Service: Thoracic;  Laterality: N/A;  . Tee without cardioversion  02/25/2012    Procedure:  TRANSESOPHAGEAL ECHOCARDIOGRAM (TEE);  Surgeon: Delight Ovens, MD;  Location: [facility] OR;  Service: Thoracic;  Laterality: N/A;    Current Outpatient Prescriptions  Medication Sig Dispense Refill  . diltiazem (DILACOR XR) 240 MG 24 hr capsule Take 1 capsule (240 mg total) by mouth daily.  30 capsule  11  . furosemide (LASIX) 40 MG tablet Take 1 tablet (40 mg total) by mouth 2 (two) times daily.  30 tablet  11  . Grape Seed 100 MG CAPS Take 1 capsule by mouth daily.       Marland Kitchen NEXIUM 40 MG capsule TAKE 1 CAPSULE EVERY DAY  30 capsule  3  . Omega-3 Fatty Acids (FISH OIL) 1000 MG CAPS Take 1,000 mg by mouth daily.       . propranolol (INDERAL) 10 MG tablet Take one tablet by mouth as needed  60 tablet  2  . ramipril (ALTACE) 10 MG capsule TAKE 1 CAPSULE BY MOUTH EVERY DAY  30 capsule  5  . Rivaroxaban (XARELTO) 20 MG TABS Take 1 tablet (20 mg total) by mouth daily. START tomorrow, Wednesday 03/01/2012.  30 tablet  3  . zolpidem (AMBIEN) 10 MG tablet Take 1 tablet (10 mg total) by mouth at bedtime. For sleep  30 tablet  0   No current facility-administered medications for this visit.    Allergies  Allergen Reactions  . Other Other (See Comments)  Novocaine.  REACTION:  Rapid heart rate  . Procaine Hcl     Rapid heart rate    Review of Systems negative except from HPI and PMH  Physical Exam BP 106/72  Pulse 72  Ht 5\' 2"  (1.575 m)  Wt 117 lb 12.8 oz (53.434 kg)  BMI 21.54 kg/m2 Well developed and nourished in no acute distress HENT normal Neck supple with JVP-flat Clear Regular rate and rhythm, no murmurs or gallops and no rubs Abd-soft with active BS No Clubbing cyanosis edema Skin-warm and dry A & Oriented  Grossly normal sensory and motor function; intention tremor  ECG not taken today   Assessment and  Plan

## 2012-07-10 MED ORDER — RIVAROXABAN 15 MG PO TABS: 15.0000 mg | ORAL_TABLET | Freq: Every day | ORAL | Status: DC

## 2012-07-10 NOTE — Addendum Note (Signed)
Addended by: Migdalia Dk on: 07/10/2012 03:23 PM   Modules accepted: Orders

## 2012-07-13 ENCOUNTER — Other Ambulatory Visit: Payer: Self-pay | Admitting: *Deleted

## 2012-07-13 DIAGNOSIS — N289 Disorder of kidney and ureter, unspecified: Secondary | ICD-10-CM

## 2012-07-17 ENCOUNTER — Other Ambulatory Visit: Payer: Self-pay | Admitting: Internal Medicine

## 2012-07-17 NOTE — Telephone Encounter (Signed)
Zolpidem has been called to pharmacy  

## 2012-07-19 ENCOUNTER — Telehealth: Payer: Self-pay

## 2012-07-19 NOTE — Telephone Encounter (Signed)
Ok to refill 

## 2012-07-19 NOTE — Telephone Encounter (Signed)
Patient calls in requesting a refill on her Ambien. Please advise.

## 2012-07-20 NOTE — Telephone Encounter (Signed)
Actually looking back in patient's chart the Ambien was called in on 07/17/12. Pt advised via voicemail.

## 2012-07-26 ENCOUNTER — Other Ambulatory Visit (INDEPENDENT_AMBULATORY_CARE_PROVIDER_SITE_OTHER): Payer: Medicare Other

## 2012-07-26 DIAGNOSIS — N289 Disorder of kidney and ureter, unspecified: Secondary | ICD-10-CM

## 2012-07-26 LAB — BASIC METABOLIC PANEL
Chloride: 106 mEq/L (ref 96–112)
Creatinine, Ser: 1.1 mg/dL (ref 0.4–1.2)
GFR: 52.05 mL/min — ABNORMAL LOW (ref >60.00)

## 2012-08-07 ENCOUNTER — Telehealth: Payer: Self-pay | Admitting: Internal Medicine

## 2012-08-07 ENCOUNTER — Other Ambulatory Visit: Payer: Self-pay | Admitting: Dermatology

## 2012-08-07 NOTE — Telephone Encounter (Signed)
PT AWARE OF LAB RESULTS  SEE LAB RESULTS NOTES .Zack Seal

## 2012-08-07 NOTE — Telephone Encounter (Signed)
Follow up  ° ° ° ° °Pt is returning your call  °

## 2012-08-14 ENCOUNTER — Other Ambulatory Visit: Payer: Self-pay | Admitting: Internal Medicine

## 2012-08-16 ENCOUNTER — Telehealth: Payer: Self-pay | Admitting: Internal Medicine

## 2012-08-16 NOTE — Telephone Encounter (Signed)
Discussed with lori gerhardt np, left message for pt, she will not need antibiotics prior to dental cleaning.

## 2012-08-16 NOTE — Telephone Encounter (Addendum)
New Prob   Pt states she is having her teeth cleaned tomorrow.  She wants to know if she needs to be on an antibiotic before hand. She said that if she does not answer you can leave a message.

## 2012-08-18 ENCOUNTER — Other Ambulatory Visit: Payer: Self-pay | Admitting: Internal Medicine

## 2012-08-21 NOTE — Telephone Encounter (Signed)
Zolpidem called to pharmacy  

## 2012-09-11 ENCOUNTER — Telehealth: Payer: Self-pay | Admitting: Internal Medicine

## 2012-09-11 DIAGNOSIS — M1990 Unspecified osteoarthritis, unspecified site: Secondary | ICD-10-CM

## 2012-09-11 NOTE — Telephone Encounter (Signed)
Pt would like a referral to a rheumatoid dr , having same pain she had before, except  in her hands and buttock when she saw dr Dareen Piano, wants a 2nd opinion, however they won't let her see another dr in the same practice for a 2nd opnion, also having more a-fib due to the pain

## 2012-09-11 NOTE — Telephone Encounter (Signed)
Spoke with pt, aware dr Graciela Husbands is not here. Will ask tomorrow and call the pt back. Pt agreed with this plan.

## 2012-09-12 NOTE — Telephone Encounter (Signed)
Left message for pt, will refer to dr truslow.

## 2012-09-21 ENCOUNTER — Other Ambulatory Visit: Payer: Self-pay | Admitting: Internal Medicine

## 2012-09-21 NOTE — Telephone Encounter (Signed)
Zolpidem called to pharmacy  

## 2012-09-22 ENCOUNTER — Encounter: Payer: Self-pay | Admitting: Internal Medicine

## 2012-09-22 NOTE — Telephone Encounter (Signed)
i let patient know Selena Taylor has already been called in

## 2012-10-04 ENCOUNTER — Encounter: Payer: Self-pay | Admitting: Internal Medicine

## 2012-10-10 ENCOUNTER — Ambulatory Visit: Payer: Medicare Other | Admitting: Internal Medicine

## 2012-10-12 ENCOUNTER — Ambulatory Visit (INDEPENDENT_AMBULATORY_CARE_PROVIDER_SITE_OTHER): Payer: Medicare Other | Admitting: Internal Medicine

## 2012-10-12 ENCOUNTER — Encounter: Payer: Self-pay | Admitting: Internal Medicine

## 2012-10-12 VITALS — BP 126/88 | HR 82 | Ht 62.5 in | Wt 122.4 lb

## 2012-10-12 DIAGNOSIS — IMO0001 Reserved for inherently not codable concepts without codable children: Secondary | ICD-10-CM

## 2012-10-12 DIAGNOSIS — M791 Myalgia, unspecified site: Secondary | ICD-10-CM

## 2012-10-12 DIAGNOSIS — I4891 Unspecified atrial fibrillation: Secondary | ICD-10-CM

## 2012-10-12 DIAGNOSIS — I10 Essential (primary) hypertension: Secondary | ICD-10-CM

## 2012-10-12 NOTE — Patient Instructions (Signed)
Your physician recommends that you continue on your current medications as directed. Please refer to the Current Medication list given to you today.  Your physician wants you to follow-up in: 6 months with Dr. Klein. You will receive a reminder letter in the mail two months in advance. If you don't receive a letter, please call our office to schedule the follow-up appointment.  

## 2012-10-12 NOTE — Assessment & Plan Note (Signed)
Persistent. She is on Rivaroxaban largely asymptomatic

## 2012-10-12 NOTE — Progress Notes (Signed)
Patient Care Team: Jacques Navy, MD as PCP - General   HPI  Selena Taylor is a 77 y.o. female Seen in followup for atrial fibrillation and pericarditis. Because of persistent symptoms she had undergone a pericardial window.  Efforts for anti-inflammatories were complicated by drug intolerance. She has had infrequent atrial fibrillation symptoms.   There has been no significant chest pain or shortness of breath   Past Medical History  Diagnosis Date  . TIA (transient ischemic attack)   . PAF (paroxysmal atrial fibrillation)     has been on Flecainide in the past. Stopped 02/10/11; Remains on coumadin anticoagulation; intol to Rythmol and failed DCCV; noted to be in AFlutter 5/13;  Echo 01/2011:  EF 55-60%, mild MR, mild LAE.  Myoview 6/12:  No ischemia, EF 74%  . HTN (hypertension)   . Endometriosis   . Osteopenia   . Insomnia   . Chronic anticoagulation     on coumadin  . Anxiety   . Pericardial effusion   . Shortness of breath   . Pericardial effusion 02/23/2012  . CHF (congestive heart failure)     Past Surgical History  Procedure Laterality Date  . Polypectomy    . Total hysterectomy and bilateral salpingoopherectomy    . Appendectomy    . Skin cancer removal    . Cardioversion  01/14/2011    Procedure: CARDIOVERSION;  Surgeon: Duke Salvia, MD;  Location: [facility] OR;  Service: Cardiovascular;  Laterality: N/A;  To be completed in Neuro OR 33 time slot 0830 12/20  . Cardioversion  05/27/2011    Procedure: CARDIOVERSION;  Surgeon: Duke Salvia, MD;  Location: [facility] OR;  Service: Cardiovascular;  Laterality: N/A;  . Pericardial window  02/25/2012    Procedure: PERICARDIAL WINDOW;  Surgeon: Delight Ovens, MD;  Location: [facility] OR;  Service: Thoracic;  Laterality: N/A;  . Tee without cardioversion  02/25/2012    Procedure: TRANSESOPHAGEAL ECHOCARDIOGRAM (TEE);  Surgeon: Delight Ovens, MD;  Location: [facility] OR;  Service: Thoracic;  Laterality: N/A;    Current Outpatient  Prescriptions  Medication Sig Dispense Refill  . diltiazem (DILACOR XR) 240 MG 24 hr capsule Take 1 capsule (240 mg total) by mouth daily.  30 capsule  11  . furosemide (LASIX) 40 MG tablet Take 1 tablet (40 mg total) by mouth 2 (two) times daily.  30 tablet  11  . Grape Seed 100 MG CAPS Take 1 capsule by mouth daily.       . Omega-3 Fatty Acids (FISH OIL) 1000 MG CAPS Take 1,000 mg by mouth daily.       . ramipril (ALTACE) 10 MG capsule TAKE 1 CAPSULE BY MOUTH EVERY DAY  30 capsule  0  . Rivaroxaban (XARELTO) 15 MG TABS tablet Take 1 tablet (15 mg total) by mouth daily.  30 tablet  6  . zolpidem (AMBIEN) 10 MG tablet TAKE 1 TABLET BY MOUTH EVERY NIGHT AT BEDTIME FOR SLEEP  30 tablet  0   No current facility-administered medications for this visit.    Allergies  Allergen Reactions  . Other Other (See Comments)    Novocaine.  REACTION:  Rapid heart rate  . Procaine Hcl     Rapid heart rate    Review of Systems negative except from HPI and PMH  Physical Exam BP 126/88  Pulse 82  Ht 5' 2.5" (1.588 m)  Wt 122 lb 6.4 oz (55.52 kg)  BMI 22.02 kg/m2 Well developed and nourished in no  acute distress HENT normal Neck supple with JVP-flat Clear Regular rate and rhythm, no murmurs or gallops and no rubs Abd-soft with active BS No Clubbing cyanosis edema Skin-warm and dry A & Oriented  Grossly normal sensory and motor function; intention tremor  ECG not taken today   Assessment and  Plan

## 2012-10-12 NOTE — Assessment & Plan Note (Addendum)
She comes in today with concerns about her myalgias and what is appropriate followup. She is seen to rheumatologist with different recommendations. She has seen a chiropractor her daughter recommended. I told her I have insufficient insight to be of help  I suggested she might followup with her PCP For consideration of referral to tertiary care center

## 2012-10-12 NOTE — Assessment & Plan Note (Signed)
Recently controlled

## 2012-10-21 ENCOUNTER — Other Ambulatory Visit: Payer: Self-pay | Admitting: Internal Medicine

## 2012-10-23 NOTE — Telephone Encounter (Signed)
Zolpidem called to pharmacy  

## 2012-11-06 ENCOUNTER — Other Ambulatory Visit: Payer: Self-pay | Admitting: Internal Medicine

## 2012-11-08 ENCOUNTER — Ambulatory Visit (INDEPENDENT_AMBULATORY_CARE_PROVIDER_SITE_OTHER): Payer: Medicare Other

## 2012-11-08 DIAGNOSIS — Z23 Encounter for immunization: Secondary | ICD-10-CM

## 2012-11-20 ENCOUNTER — Other Ambulatory Visit: Payer: Self-pay | Admitting: Internal Medicine

## 2012-12-04 ENCOUNTER — Encounter: Payer: Self-pay | Admitting: Internal Medicine

## 2012-12-11 ENCOUNTER — Other Ambulatory Visit: Payer: Self-pay

## 2012-12-11 DIAGNOSIS — Z1231 Encounter for screening mammogram for malignant neoplasm of breast: Secondary | ICD-10-CM

## 2012-12-20 ENCOUNTER — Encounter: Payer: Self-pay | Admitting: Internal Medicine

## 2012-12-25 ENCOUNTER — Other Ambulatory Visit: Payer: Self-pay | Admitting: Internal Medicine

## 2013-01-01 ENCOUNTER — Encounter: Payer: Self-pay | Admitting: Internal Medicine

## 2013-01-05 ENCOUNTER — Ambulatory Visit: Payer: Medicare Other

## 2013-01-22 ENCOUNTER — Ambulatory Visit
Admission: RE | Admit: 2013-01-22 | Discharge: 2013-01-22 | Disposition: A | Discharge status: Home / Self Care | Payer: Medicare Other | Source: Ambulatory Visit

## 2013-01-22 DIAGNOSIS — Z1231 Encounter for screening mammogram for malignant neoplasm of breast: Secondary | ICD-10-CM

## 2013-01-29 ENCOUNTER — Other Ambulatory Visit: Payer: Self-pay | Admitting: Internal Medicine

## 2013-01-29 ENCOUNTER — Telehealth: Payer: Self-pay

## 2013-01-29 NOTE — Telephone Encounter (Signed)
Ambien has been called in.

## 2013-01-29 NOTE — Telephone Encounter (Signed)
The patient called and is hoping to get a refill of ambien.   She also wanted her and her husband to be transferred to Dr.Plotnikov due to finding out Dr.Norins is retiring. I informed the patient that Dr.Plotnikov is not taking on new patients, however, she still wanted her wishes known.   If there is anything I can do to help her, let me know. Thanks!

## 2013-01-29 NOTE — Telephone Encounter (Signed)
Ok to transfer to Dr. Roena MaladyAVP by me.

## 2013-01-30 NOTE — Telephone Encounter (Signed)
Called pt and left a voice mail to have them call back to schedule appts with Dr.Plotnikov.  Will try back soon if call is not returned.

## 2013-02-14 ENCOUNTER — Telehealth: Payer: Self-pay | Admitting: Internal Medicine

## 2013-02-14 NOTE — Telephone Encounter (Signed)
New message    Husband has alzheimers.  Want to know if Dr Graciela HusbandsKlein knows of a psychiatrist that sees alzheimer's patients.  Husband is not a pt here.

## 2013-02-19 NOTE — Telephone Encounter (Signed)
No answer  (Dr. Graciela HusbandsKlein has no suggestions)

## 2013-02-21 NOTE — Telephone Encounter (Signed)
Left message on personal answering machine that Dr. Graciela HusbandsKlein has no referral to give them on requested psychiatrist.

## 2013-02-25 ENCOUNTER — Other Ambulatory Visit: Payer: Self-pay | Admitting: Internal Medicine

## 2013-02-27 ENCOUNTER — Other Ambulatory Visit: Payer: Self-pay | Admitting: Internal Medicine

## 2013-02-27 ENCOUNTER — Other Ambulatory Visit (HOSPITAL_COMMUNITY): Payer: Self-pay | Admitting: Physician Assistant

## 2013-04-09 ENCOUNTER — Telehealth: Payer: Self-pay | Admitting: Internal Medicine

## 2013-04-09 NOTE — Telephone Encounter (Signed)
Pt wants to be Selena Taylor's patient along with her husband Selena Taylor ([birth year]02[birth year]33) when Selena Taylor leaves.  Selena Taylor told them that would be ok, but they weren't put on the list and Dr. Plotnikov's 50 patients from Selena Taylor has been filled.  Is it ok?

## 2013-04-09 NOTE — Telephone Encounter (Signed)
Ok Thx 

## 2013-04-10 NOTE — Telephone Encounter (Signed)
Pt's are aware and set up with Dr. Posey ReaPlotnikov.

## 2013-04-12 ENCOUNTER — Ambulatory Visit (INDEPENDENT_AMBULATORY_CARE_PROVIDER_SITE_OTHER): Payer: Medicare Other | Admitting: Internal Medicine

## 2013-04-12 ENCOUNTER — Encounter: Payer: Self-pay | Admitting: Internal Medicine

## 2013-04-12 VITALS — BP 132/76 | HR 129 | Ht 62.5 in | Wt 120.0 lb

## 2013-04-12 DIAGNOSIS — I4891 Unspecified atrial fibrillation: Secondary | ICD-10-CM

## 2013-04-12 MED ORDER — NEBIVOLOL HCL 2.5 MG PO TABS: 2.5000 mg | ORAL_TABLET | Freq: Every day | ORAL | Status: DC

## 2013-04-12 MED ORDER — FUROSEMIDE 20 MG PO TABS: 20.0000 mg | ORAL_TABLET | Freq: Every day | ORAL | Status: DC | PRN

## 2013-04-12 NOTE — Progress Notes (Signed)
Patient Care Team: Jacques Navy, MD as PCP - General   HPI  Selena Taylor is a 78 y.o. female Seen in followup for atrial fibrillation and pericarditis. Because of persistent symptoms she had undergone a pericardial window.  She takes Rivaroxaban    There has been no significant chest pain or shortness of breath  She has noted her HR increased of late  No change in stool or bleeding, coarseness of hair or bowel movement   Past Medical History  Diagnosis Date  . TIA (transient ischemic attack)   . PAF (paroxysmal atrial fibrillation)     has been on Flecainide in the past. Stopped 02/10/11; Remains on coumadin anticoagulation; intol to Rythmol and failed DCCV; noted to be in AFlutter 5/13;  Echo 01/2011:  EF 55-60%, mild MR, mild LAE.  Myoview 6/12:  No ischemia, EF 74%  . HTN (hypertension)   . Endometriosis   . Osteopenia   . Insomnia   . Chronic anticoagulation     on coumadin  . Anxiety   . Pericardial effusion   . Shortness of breath   . Pericardial effusion 02/23/2012  . CHF (congestive heart failure)     Past Surgical History  Procedure Laterality Date  . Polypectomy    . Total hysterectomy and bilateral salpingoopherectomy    . Appendectomy    . Skin cancer removal    . Cardioversion  01/14/2011    Procedure: CARDIOVERSION;  Surgeon: Duke Salvia, MD;  Location[facility] OR;  Service: Cardiovascular;  Laterality: N/A;  To be completed in Neuro OR 33 time slot 0830 12/20  . Cardioversion  05/27/2011    Procedure: CARDIOVERSION;  Surgeon: Duke Salvia, MD;  Location: [facility] OR;  Service: Cardiovascular;  Laterality: N/A;  . Pericardial window  02/25/2012    Procedure: PERICARDIAL WINDOW;  Surgeon: Delight Ovens, MD;  Location: [facility] OR;  Service: Thoracic;  Laterality: N/A;  . Tee without cardioversion  02/25/2012    Procedure: TRANSESOPHAGEAL ECHOCARDIOGRAM (TEE);  Surgeon: Delight Ovens, MD;  Location: [facility] OR;  Service: Thoracic;  Laterality: N/A;     Current Outpatient Prescriptions  Medication Sig Dispense Refill  . CARTIA XT 240 MG 24 hr capsule TAKE 1 CAPSULE BY MOUTH DAILY  30 capsule  0  . furosemide (LASIX) 20 MG tablet Take 20 mg by mouth daily.      . Grape Seed 100 MG CAPS Take 1 capsule by mouth daily.       . Omega-3 Fatty Acids (FISH OIL) 1000 MG CAPS Take 1,000 mg by mouth daily.       . ramipril (ALTACE) 10 MG capsule TAKE ONE CAPSULE BY MOUTH EVERY DAY  30 capsule  6  . Rivaroxaban (XARELTO) 15 MG TABS tablet Take 1 tablet (15 mg total) by mouth daily.  30 tablet  6  . zolpidem (AMBIEN) 10 MG tablet TAKE 1 TABLET BY MOUTH EVERY DAY AT BEDTIME AS NEEDED  30 tablet  5  . [DISCONTINUED] diltiazem (DILACOR XR) 240 MG 24 hr capsule Take 1 capsule (240 mg total) by mouth daily.  30 capsule  11   No current facility-administered medications for this visit.    Allergies  Allergen Reactions  . Other Other (See Comments)    Novocaine.  REACTION:  Rapid heart rate  . Procaine Hcl     Rapid heart rate    Review of Systems negative except from HPI and PMH  Physical Exam  BP 132/76  Pulse 129  Ht 5' 2.5" (1.588 m)  Wt 120 lb (54.432 kg)  BMI 21.59 kg/m2 Well developed and well nourished in no acute distress HENT normal E scleral and icterus clear Neck Supple JVP 6-7t; carotids brisk and full Clear to ausculation Irregular irregular rate and rhythm, no murmurs gallops or rub Soft with active bowel sounds No clubbing cyanosis  Edema Alert and oriented, grossly normal motor and sensory function Skin Warm and Dry    Assessment and  Plan  Atrial fibrillation with a rapid rate  Renal insufficiency  Fatigue with beta blockers  Hypertension She is doing quite well. I'm surprised however about her rapid rate. We will check a TSH and CBC. The former has not been checked in a couple of years the latter has been up and down in the past. Will also check her metabolic profile her renal function has been borderline for  her Rivaroxaban in the past.  Will also give her low-dose beta blockers to work on augmenting heart rate control. I have given her Bystolic 2.5 mg. Hopefully early hydrophilicity of this drug will prevent fatigue  She is to come back in about 4 weeks to assess heart rate control.

## 2013-04-12 NOTE — Patient Instructions (Signed)
Your physician has recommended you make the following change in your medication:  1) Start  Bystolic 2.5 mg daily  Your physician recommends that you return for lab work today: TSH, CBCD, BMET  Your physician recommends that you schedule a follow-up appointment in: 4 weeks with [employer] Corporation[employer]oke Edmisten, PA-C.  Your physician wants you to follow-up in: 1 year with Dr. Graciela HusbandsKlein.  You will receive a reminder letter in the mail two months in advance. If you don't receive a letter, please call our office to schedule the follow-up appointment.

## 2013-04-13 ENCOUNTER — Telehealth: Payer: Self-pay | Admitting: *Deleted

## 2013-04-13 ENCOUNTER — Telehealth: Payer: Self-pay | Admitting: Internal Medicine

## 2013-04-13 NOTE — Telephone Encounter (Signed)
States was seen yesterday by Dr. Graciela HusbandsKlein and he gave her Bystolic 2.5 mg.  States she took one yesterday afternoon and became very nauseated.  States she has tried beta blockers in past and could not take.  She states she isn't going to take the medication.  States that occ she gets very anxious and heart rate goes fast. Husband has alzheimer's and when she visits him she becomes more anxious. Advised Dr. Graciela HusbandsKlein not in office today nor is his nurse so will forward message to them to address on Monday.  Also states the lab techs were unable to draw her blood so she will be coming back next Thursday to have [place].

## 2013-04-13 NOTE — Telephone Encounter (Signed)
Spoke with pt on day of lab appt...she wants keith to draw her blood she will be back on Monday

## 2013-04-13 NOTE — Telephone Encounter (Signed)
New message    Patient was prescribe new medication. bystolic 2.5 mg . Read the side effect decided she does not want to take medication. Only took an  1/2 pill last night.

## 2013-04-16 ENCOUNTER — Ambulatory Visit (INDEPENDENT_AMBULATORY_CARE_PROVIDER_SITE_OTHER): Payer: Medicare Other | Admitting: *Deleted

## 2013-04-16 ENCOUNTER — Other Ambulatory Visit: Payer: Self-pay | Admitting: *Deleted

## 2013-04-16 ENCOUNTER — Other Ambulatory Visit (HOSPITAL_COMMUNITY): Payer: Self-pay | Admitting: Internal Medicine

## 2013-04-16 DIAGNOSIS — I4891 Unspecified atrial fibrillation: Secondary | ICD-10-CM

## 2013-04-16 DIAGNOSIS — I10 Essential (primary) hypertension: Secondary | ICD-10-CM

## 2013-04-16 LAB — CBC WITH DIFFERENTIAL/PLATELET
BASOS ABS: 0 10*3/uL (ref 0.0–0.1)
Basophils Relative: 0.4 % (ref 0.0–3.0)
EOS ABS: 0.1 10*3/uL (ref 0.0–0.7)
Eosinophils Relative: 1.1 % (ref 0.0–5.0)
HCT: 40.6 % (ref 36.0–46.0)
Hemoglobin: 13.4 g/dL (ref 12.0–15.0)
Lymphocytes Relative: 22.6 % (ref 12.0–46.0)
Lymphs Abs: 2.4 10*3/uL (ref 0.7–4.0)
MCHC: 33.1 g/dL (ref 30.0–36.0)
MCV: 102.5 fl — AB (ref 78.0–100.0)
MONO ABS: 0.4 10*3/uL (ref 0.1–1.0)
Monocytes Relative: 3.8 % (ref 3.0–12.0)
NEUTROS PCT: 72.1 % (ref 43.0–77.0)
Neutro Abs: 7.6 10*3/uL (ref 1.4–7.7)
Platelets: 245 10*3/uL (ref 150.0–400.0)
RBC: 3.96 Mil/uL (ref 3.87–5.11)
RDW: 14.7 % — AB (ref 11.5–14.6)
WBC: 10.6 10*3/uL — ABNORMAL HIGH (ref 4.5–10.5)

## 2013-04-16 LAB — BASIC METABOLIC PANEL
BUN: 22 mg/dL (ref 6–23)
CHLORIDE: 104 meq/L (ref 96–112)
CO2: 29 mEq/L (ref 19–32)
Calcium: 9.1 mg/dL (ref 8.4–10.5)
Creatinine, Ser: 0.9 mg/dL (ref 0.4–1.2)
GFR: 66.5 mL/min (ref >60.00)
Glucose, Bld: 108 mg/dL — ABNORMAL HIGH (ref 70–99)
Potassium: 4.3 mEq/L (ref 3.5–5.1)
Sodium: 140 mEq/L (ref 135–145)

## 2013-04-16 LAB — TSH: TSH: 0.87 u[IU]/mL (ref 0.35–5.50)

## 2013-04-16 MED ORDER — DILTIAZEM HCL ER COATED BEADS 240 MG PO CP24: 240.0000 mg | ORAL_CAPSULE | Freq: Two times a day (BID) | ORAL | Status: DC

## 2013-04-16 MED ORDER — RIVAROXABAN 15 MG PO TABS: 15.0000 mg | ORAL_TABLET | Freq: Every day | ORAL | Status: DC

## 2013-04-16 NOTE — Telephone Encounter (Signed)
Advised not to take Bystolic. Increase Cartia to 240 mg  BID.  Pt also asks for Xarelto refill to be sent. Refills sent to requested [facility] on [address]. Pt agreeable to plan.

## 2013-04-19 ENCOUNTER — Other Ambulatory Visit: Payer: Medicare Other

## 2013-04-30 ENCOUNTER — Telehealth: Payer: Self-pay | Admitting: Internal Medicine

## 2013-04-30 NOTE — Telephone Encounter (Signed)
Patient states that our office needs to call her [facility] pharmacy to authorize them to fill her zolpidem (AMBIEN) 10 MG tablet. Dr. Debby BudNorins gave her enough for 6 months but she says in order for insurance to pay we have to give them permission to refill every 3 months. Please advise.

## 2013-05-01 ENCOUNTER — Telehealth: Payer: Self-pay | Admitting: Internal Medicine

## 2013-05-01 ENCOUNTER — Other Ambulatory Visit: Payer: Self-pay | Admitting: *Deleted

## 2013-05-01 DIAGNOSIS — R899 Unspecified abnormal finding in specimens from other organs, systems and tissues: Secondary | ICD-10-CM

## 2013-05-01 NOTE — Telephone Encounter (Signed)
A user error has taken place: encounter opened in error, closed for administrative reasons.   See lab results for documentation 

## 2013-05-01 NOTE — Telephone Encounter (Signed)
New message ° ° ° ° °Returning Selena Taylor's call from yesterday °

## 2013-05-01 NOTE — Telephone Encounter (Signed)
Called pharmacy about rx. Pharmacist stated that insurance requires PA. [facility] is faxing request to office.

## 2013-05-01 NOTE — Telephone Encounter (Signed)
Patient is calling back about her Ambien. States that it is really urgent that she gets her sleep at night because she has an ill husband. Please advise.

## 2013-05-02 ENCOUNTER — Other Ambulatory Visit (INDEPENDENT_AMBULATORY_CARE_PROVIDER_SITE_OTHER): Payer: Medicare Other

## 2013-05-02 DIAGNOSIS — R6889 Other general symptoms and signs: Secondary | ICD-10-CM

## 2013-05-02 DIAGNOSIS — R899 Unspecified abnormal finding in specimens from other organs, systems and tissues: Secondary | ICD-10-CM

## 2013-05-02 LAB — VITAMIN B12: VITAMIN B 12: 643 pg/mL (ref 211–911)

## 2013-05-02 LAB — FOLATE

## 2013-05-08 ENCOUNTER — Ambulatory Visit (INDEPENDENT_AMBULATORY_CARE_PROVIDER_SITE_OTHER): Payer: Medicare Other | Admitting: Cardiology

## 2013-05-08 ENCOUNTER — Encounter: Payer: Self-pay | Admitting: Cardiology

## 2013-05-08 VITALS — BP 134/90 | HR 78 | Ht 62.0 in | Wt 120.0 lb

## 2013-05-08 DIAGNOSIS — I4891 Unspecified atrial fibrillation: Secondary | ICD-10-CM

## 2013-05-08 MED ORDER — NEBIVOLOL HCL 2.5 MG PO TABS: 2.5000 mg | ORAL_TABLET | Freq: Every day | ORAL | Status: DC

## 2013-05-08 MED ORDER — DILTIAZEM HCL ER COATED BEADS 240 MG PO CP24: 240.0000 mg | ORAL_CAPSULE | Freq: Every day | ORAL | Status: DC

## 2013-05-08 NOTE — Patient Instructions (Addendum)
Your physician has recommended you make the following change in your medication:   1. Increase Diltiazem to 240 mg 1 tab daily 2. Start Bystolic 2.5 mg 1 tab daily  Your physician wants you to follow-up in: 1 year with Dr. Logan Boresklein You will receive a reminder letter in the mail two months in advance. If you don't receive a letter, please call our office to schedule the follow-up appointment.

## 2013-05-09 ENCOUNTER — Telehealth: Payer: Self-pay | Admitting: *Deleted

## 2013-05-09 NOTE — Progress Notes (Signed)
ELECTROPHYSIOLOGY OFFICE NOTE   Patient ID: Selena Taylor MRN: [id number], DOB/AGE: 26-Jun-1935   Date of Visit: 05/08/2013  Primary Physician: Sonda PrimesAlex Plotnikov, MD Primary EP: Berton MountSteve Klein, MD Reason for Visit: EP follow-up for atrial fibrillation  History of Present Illness  Selena Taylor is a 78 y.o. female with atrial fibrillation and pericarditis with effusion requiring pericardial window approximately one year ago who presents today for 2-week electrophysiology followup regarding AFib. She was last seen by Dr. Graciela HusbandsKlein on 04/12/2013 at which time her V rate was uncontrolled. Dr. Graciela HusbandsKlein recommended adding Bystolic 2.5 mg once daily.   Today, Selena Taylor reports she did not start Bystolic. She was concerned it may cause fatigue, similar to what she experienced while on metoprolol. Instead she increased her diltiazem dose to 240 mg twice daily. She has seen significant fatigue since making this adjustment. She has no other complaints. She denies chest pain or shortness of breath. She denies palpitations, dizziness, near syncope or syncope. She denies LE swelling, orthopnea or PND.   Past Medical History Past Medical History  Diagnosis Date  . TIA (transient ischemic attack)   . PAF (paroxysmal atrial fibrillation)     has been on Flecainide in the past. Stopped 02/10/11; Remains on coumadin anticoagulation; intol to Rythmol and failed DCCV; noted to be in AFlutter 5/13;  Echo 01/2011:  EF 55-60%, mild MR, mild LAE.  Myoview 6/12:  No ischemia, EF 74%  . HTN (hypertension)   . Endometriosis   . Osteopenia   . Insomnia   . Chronic anticoagulation     on coumadin  . Anxiety   . Pericardial effusion   . Shortness of breath   . Pericardial effusion 02/23/2012  . CHF (congestive heart failure)     Past Surgical History Past Surgical History  Procedure Laterality Date  . Polypectomy    . Total hysterectomy and bilateral salpingoopherectomy    . Appendectomy    . Skin cancer removal    .  Cardioversion  01/14/2011    Procedure: CARDIOVERSION;  Surgeon: Duke SalviaSteven C Klein, MD;  [facility];  Service: Cardiovascular;  Laterality: N/A;  To be completed in Neuro OR 33 time slot 0830 12/20  . Cardioversion  05/27/2011    Procedure: CARDIOVERSION;  Surgeon: Duke SalviaSteven C Klein, MD;  [facility];  Service: Cardiovascular;  Laterality: N/A;  . Pericardial window  02/25/2012    Procedure: PERICARDIAL WINDOW;  Surgeon: Delight OvensEdward B Gerhardt, MD;  [facility];  Service: Thoracic;  Laterality: N/A;  . Tee without cardioversion  02/25/2012    Procedure: TRANSESOPHAGEAL ECHOCARDIOGRAM (TEE);  Surgeon: Delight OvensEdward B Gerhardt, MD;  [facility];  Service: Thoracic;  Laterality: N/A;    Allergies/Intolerances Allergies  Allergen Reactions  . Other Other (See Comments)    Novocaine.  REACTION:  Rapid heart rate  . Procaine Hcl     Rapid heart rate    Current Home Medications Current Outpatient Prescriptions  Medication Sig Dispense Refill  . diltiazem (CARTIA XT) 240 MG 24 hr capsule Take 1 capsule (240 mg total) by mouth daily.  30 capsule  6  . furosemide (LASIX) 20 MG tablet Take 1 tablet (20 mg total) by mouth daily as needed.  30 tablet  3  . Grape Seed 100 MG CAPS Take 1 capsule by mouth daily.       . Omega-3 Fatty Acids (FISH OIL) 1000 MG CAPS Take 1,000 mg by mouth daily.       .Marland Kitchen  ramipril (ALTACE) 10 MG capsule TAKE ONE CAPSULE BY MOUTH EVERY DAY  30 capsule  6  . Rivaroxaban (XARELTO) 15 MG TABS tablet Take 1 tablet (15 mg total) by mouth daily.  30 tablet  6  . zolpidem (AMBIEN) 10 MG tablet TAKE 1 TABLET BY MOUTH EVERY DAY AT BEDTIME AS NEEDED  30 tablet  5  . nebivolol (BYSTOLIC) 2.5 MG tablet Take 1 tablet (2.5 mg total) by mouth daily.  30 tablet  6  . [DISCONTINUED] diltiazem (DILACOR XR) 240 MG 24 hr capsule Take 1 capsule (240 mg total) by mouth daily.  30 capsule  11   No current facility-administered medications for this visit.    Social History History   Social  History  . Marital Status: Married    Spouse Name: N/A    Number of Children: N/A  . Years of Education: N/A   Occupational History  . Not on file.   Social History Main Topics  . Smoking status: Never Smoker   . Smokeless tobacco: Never Used  . Alcohol Use: 8.4 oz/week    14 Glasses of wine per week     Comment: 1-2 glasses of wine nightly  . Drug Use: No  . Sexual Activity: Not Currently   Other Topics Concern  . Not on file   Social History Narrative   HSG; Became a stewardness..   Married - 1959.Marland Kitchen.   1 son - '65; 1 daughter '60; 2 grandchildren..   Occupation: Retired..   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   End of life Care: no DNR, DNI, no futile or heroic measures.              Review of Systems General: No chills, fever, night sweats or weight changes Cardiovascular: No chest pain, dyspnea on exertion, edema, orthopnea, palpitations, paroxysmal nocturnal dyspnea Dermatological: No rash, lesions or masses Respiratory: No cough, dyspnea Urologic: No hematuria, dysuria Abdominal: No nausea, vomiting, diarrhea, bright red blood per rectum, melena, or hematemesis Neurologic: No visual changes, weakness, changes in mental status All other systems reviewed and are otherwise negative except as noted above.  Physical Exam Vitals: Blood pressure 134/90, pulse 78, height 5\' 2"  (1.575 m), weight 120 lb (54.432 kg).  General: Well developed, well appearing 78 y.o. female in no acute distress. HEENT: Normocephalic, atraumatic. EOMs intact. Sclera nonicteric. Oropharynx clear.  Neck: Supple. No JVD. Lungs: Respirations regular and unlabored, CTA bilaterally. No wheezes, rales or rhonchi. Heart: Irregular. S1, S2 present. No murmurs, rub, S3 or S4. Abdomen: Soft, non-distended.  Extremities: No clubbing, cyanosis or edema. PT/Radials 2+ and equal bilaterally. Psych: Normal affect. Neuro: Alert and oriented X 3. Moves all extremities spontaneously.    Diagnostics  Recent labs  TSH 0.87     Component Value Date/Time   WBC 10.6* 04/16/2013 1416   RBC 3.96 04/16/2013 1416   HGB 13.4 04/16/2013 1416   HCT 40.6 04/16/2013 1416   PLT 245.0 04/16/2013 1416   MCV 102.5* 04/16/2013 1416   MCH 31.0 02/27/2012 0445   MCHC 33.1 04/16/2013 1416   RDW 14.7* 04/16/2013 1416   LYMPHSABS 2.4 04/16/2013 1416   MONOABS 0.4 04/16/2013 1416   EOSABS 0.1 04/16/2013 1416   BASOSABS 0.0 04/16/2013 1416      Component Value Date/Time   NA 140 04/16/2013 1416   K 4.3 04/16/2013 1416   CL 104 04/16/2013 1416   CO2 29 04/16/2013 1416   GLUCOSE 108* 04/16/2013 1416   BUN 22  04/16/2013 1416   CREATININE 0.9 04/16/2013 1416   CALCIUM 9.1 04/16/2013 1416   GFRNONAA 84* 02/27/2012 0445   GFRAA >90 02/27/2012 0445    12-lead ECG today - atrial fibrillation with V rate 91 bpm; QRS 70, QTc 393   Assessment and Plan 1. Atrial fibrillation - Rate controlled; however, very fatigued on higher dose of diltiazem - Dr. Graciela Husbands recommended Bystolic 2.5 mg once daily at last visit but she did not start - Today, will lower diltiazem dose back to 240 mg once daily and add Bystolic as above - Recommended follow-up in 2 weeks for reassessment and ECG; however, she is very busy caring for her husband with Alzheimer's dementia and requests follow-up in 2 weeks only if absolutely needed; for now, she has agreed to keep BP and pulse log for 2 weeks and call us with results - If her rate is consistently >110 bpm, she will need a follow-up appointment, ECG and up-titration of rate controlling meds  Signed, Minda Meo, PA-C 05/09/2013, 1:53 PM

## 2013-05-09 NOTE — Telephone Encounter (Signed)
Patient called for xarelto samples. I will place them at the front desk for pick up.

## 2013-05-18 NOTE — Telephone Encounter (Signed)
Pt called in about refill for Ambien. We have a PA and it has been resent to the plan via Cover my Meds site. Waiting on confirmation from site.

## 2013-05-22 ENCOUNTER — Telehealth: Payer: Self-pay | Admitting: Internal Medicine

## 2013-05-22 NOTE — Telephone Encounter (Signed)
Called [employer] and was [facility] [place] [facility] [phone] They are sending a form over to authorize the rx for ambien.  PA form has been signed and [facility] back [facility]

## 2013-05-28 ENCOUNTER — Telehealth: Payer: Self-pay | Admitting: Internal Medicine

## 2013-05-28 NOTE — Telephone Encounter (Signed)
°  Patient started taking Bystolic she was told to call in BP/Pulse rates: She is also feeling very tired on this medication.  Please see reading below:   05/07/13     143/98      103 05/08/13     118/96      70 05/09/13     154/81      97 05/10/13     128/79      80 05/11/13     124/100    116  05/28/13       108/67      186   Please call and advise.

## 2013-05-28 NOTE — Telephone Encounter (Signed)
Patient returned your call, please call back. °

## 2013-05-28 NOTE — Telephone Encounter (Signed)
Calling with her BP readings. States she is feeling very tired since starting Bystolic 2.5 mg. Advised that some of the readings with BP and HR are elevated.  Advised her that Dr. Graciela HusbandsKlein is not in office today but would forward message to him.  She also wants to know what activities she can do with her HR high.

## 2013-05-28 NOTE — Telephone Encounter (Signed)
Lm 5/4 to call back

## 2013-05-29 NOTE — Telephone Encounter (Signed)
Advised pt to Dr. Odessa FlemingKlein's recommendations. Pt will continue to keep record of BPs/HRs. Advised to rest when HR is elevated and to call us if issues worsen/increase/symptomatic. Pt and I will touch base in a couple of weeks (she will call office) to see how she is doing.  She is agreeable to this.

## 2013-05-29 NOTE — Telephone Encounter (Signed)
Recordings noted  Fatigue may improve with time and it seems like HR are a little better  Lets review in about two weeks

## 2013-06-21 ENCOUNTER — Telehealth: Payer: Self-pay | Admitting: Internal Medicine

## 2013-06-21 NOTE — Telephone Encounter (Signed)
Pt returning nurses call

## 2013-06-21 NOTE — Telephone Encounter (Signed)
Advised to follow up with PCP. Pt states she will be seeing her dermatologist tomorrow. She will call us if she has further questions/needs.

## 2013-06-21 NOTE — Telephone Encounter (Signed)
Patient has been taking Bystolic for about 5 weeks. She has developed a red rash on her face. Please call and advise.

## 2013-06-27 ENCOUNTER — Other Ambulatory Visit: Payer: Self-pay | Admitting: Internal Medicine

## 2013-07-02 ENCOUNTER — Ambulatory Visit (INDEPENDENT_AMBULATORY_CARE_PROVIDER_SITE_OTHER): Payer: Medicare Other | Admitting: Internal Medicine

## 2013-07-02 ENCOUNTER — Encounter: Payer: Self-pay | Admitting: Internal Medicine

## 2013-07-02 ENCOUNTER — Other Ambulatory Visit: Payer: Medicare Other

## 2013-07-02 VITALS — BP 130/74 | HR 68 | Temp 97.9°F | Resp 16 | Wt 124.0 lb

## 2013-07-02 DIAGNOSIS — R259 Unspecified abnormal involuntary movements: Secondary | ICD-10-CM

## 2013-07-02 DIAGNOSIS — R251 Tremor, unspecified: Secondary | ICD-10-CM

## 2013-07-02 DIAGNOSIS — I10 Essential (primary) hypertension: Secondary | ICD-10-CM

## 2013-07-02 DIAGNOSIS — R5383 Other fatigue: Secondary | ICD-10-CM

## 2013-07-02 DIAGNOSIS — I4891 Unspecified atrial fibrillation: Secondary | ICD-10-CM

## 2013-07-02 DIAGNOSIS — Z7901 Long term (current) use of anticoagulants: Secondary | ICD-10-CM

## 2013-07-02 DIAGNOSIS — I309 Acute pericarditis, unspecified: Secondary | ICD-10-CM | POA: Insufficient documentation

## 2013-07-02 DIAGNOSIS — R5381 Other malaise: Secondary | ICD-10-CM

## 2013-07-02 MED ORDER — VITAMIN D 1000 UNITS PO TABS: 1000.0000 [IU] | ORAL_TABLET | Freq: Every day | ORAL | Status: AC

## 2013-07-02 NOTE — Assessment & Plan Note (Signed)
Labs     ECHO

## 2013-07-02 NOTE — Progress Notes (Signed)
   Subjective:     HPI  New pt -- former Dr MEN's C/o HTn C/o fatigue on Bystolic  BP Readings from Last 3 Encounters:  07/02/13 130/74  05/08/13 134/90  04/12/13 132/76   Wt Readings from Last 3 Encounters:  07/02/13 124 lb (56.246 kg)  05/08/13 120 lb (54.432 kg)  04/12/13 120 lb (54.432 kg)       Review of Systems  Constitutional: Negative for chills, activity change, appetite change, fatigue and unexpected weight change.  HENT: Negative for congestion, mouth sores and sinus pressure.   Eyes: Negative for visual disturbance.  Respiratory: Positive for shortness of breath. Negative for cough and chest tightness.   Gastrointestinal: Negative for nausea and abdominal pain.  Genitourinary: Negative for frequency, difficulty urinating and vaginal pain.  Musculoskeletal: Negative for back pain and gait problem.  Skin: Negative for pallor and rash.  Neurological: Positive for weakness. Negative for dizziness, tremors, numbness and headaches.  Psychiatric/Behavioral: Negative for suicidal ideas, confusion and sleep disturbance. The patient is not nervous/anxious.        Objective:   Physical Exam  Constitutional: She appears well-developed. No distress.  HENT:  Head: Normocephalic.  Right Ear: External ear normal.  Left Ear: External ear normal.  Nose: Nose normal.  Mouth/Throat: Oropharynx is clear and moist.  Eyes: Conjunctivae are normal. Pupils are equal, round, and reactive to light. Right eye exhibits no discharge. Left eye exhibits no discharge.  Neck: Normal range of motion. Neck supple. No JVD present. No tracheal deviation present. No thyromegaly present.  Cardiovascular: Normal rate, regular rhythm and normal heart sounds.   Pulmonary/Chest: No stridor. No respiratory distress. She has no wheezes.  Abdominal: Soft. Bowel sounds are normal. She exhibits no distension and no mass. There is no tenderness. There is no rebound and no guarding.  Musculoskeletal:  She exhibits no edema and no tenderness.  Lymphadenopathy:    She has no cervical adenopathy.  Neurological: She displays normal reflexes. No cranial nerve deficit. She exhibits normal muscle tone. Coordination normal.  Skin: No rash noted. No erythema.  Psychiatric: She has a normal mood and affect. Her behavior is normal. Judgment and thought content normal.    Lab Results  Component Value Date   WBC 10.6* 04/16/2013   HGB 13.4 04/16/2013   HCT 40.6 04/16/2013   PLT 245.0 04/16/2013   GLUCOSE 108* 04/16/2013   CHOL 125 02/06/2012   TRIG 40 02/06/2012   HDL 91 02/06/2012   LDLCALC 26 02/06/2012   ALT 20 02/27/2012   AST 17 02/27/2012   NA 140 04/16/2013   K 4.3 04/16/2013   CL 104 04/16/2013   CREATININE 0.9 04/16/2013   BUN 22 04/16/2013   CO2 29 04/16/2013   TSH 0.87 04/16/2013   INR 2.03* 02/29/2012   A complex case      Assessment & Plan:

## 2013-07-02 NOTE — Assessment & Plan Note (Addendum)
2/14 acute ?etiol s/p pericardial window Labs ECHO

## 2013-07-02 NOTE — Progress Notes (Signed)
Pre visit review using our clinic review tool, if applicable. No additional management support is needed unless otherwise documented below in the visit note. 

## 2013-07-02 NOTE — Assessment & Plan Note (Signed)
Continue with current prescription therapy as reflected on the Med list.  

## 2013-07-02 NOTE — Assessment & Plan Note (Signed)
No Rx

## 2013-07-02 NOTE — Patient Instructions (Signed)
Take Bystolic at night

## 2013-07-02 NOTE — Assessment & Plan Note (Signed)
Take Bystolic at hs

## 2013-07-02 NOTE — Assessment & Plan Note (Signed)
On Xarelto 

## 2013-07-09 ENCOUNTER — Other Ambulatory Visit: Payer: Self-pay | Admitting: *Deleted

## 2013-07-09 DIAGNOSIS — I309 Acute pericarditis, unspecified: Secondary | ICD-10-CM

## 2013-07-09 DIAGNOSIS — I4891 Unspecified atrial fibrillation: Secondary | ICD-10-CM

## 2013-07-09 DIAGNOSIS — R5383 Other fatigue: Secondary | ICD-10-CM

## 2013-07-09 DIAGNOSIS — I10 Essential (primary) hypertension: Secondary | ICD-10-CM

## 2013-07-11 ENCOUNTER — Encounter: Payer: Self-pay | Admitting: *Deleted

## 2013-07-13 ENCOUNTER — Telehealth: Payer: Self-pay

## 2013-07-13 NOTE — Telephone Encounter (Signed)
[facility] [phone] states additional clinical information is needed.

## 2013-07-16 NOTE — Telephone Encounter (Signed)
Left msg on triage requesting to speak with Stacy concerning appeal on pt zolpidem. Needing to know has the pt tried and fail zolipidem 5 mg or any other sleep medication. Also will need dx...Raechel Chute/lmb

## 2013-07-17 NOTE — Telephone Encounter (Signed)
Left mess for Deanna to call back.

## 2013-07-17 NOTE — Telephone Encounter (Signed)
Shannon left another mdg on triage requesting to speak with Kennyth ArnoldStacy concerning appeal on pt. She stated this is the 3rd time calling. If they don't received any other additional information appeal will be denied. She stated she also fax over the clinical questions that need to be answered haven't received anything back...Raechel Chute/lmb

## 2013-07-19 ENCOUNTER — Telehealth: Payer: Self-pay | Admitting: *Deleted

## 2013-07-19 NOTE — Telephone Encounter (Signed)
Wanted to inform md/nurse appeal for zolpidem was approved. Starting 05/22/13-48/28/16. Will mail both md & pt approval letter...Raechel Chute/lmb

## 2013-07-26 ENCOUNTER — Other Ambulatory Visit (INDEPENDENT_AMBULATORY_CARE_PROVIDER_SITE_OTHER): Payer: Medicare Other

## 2013-07-26 DIAGNOSIS — I10 Essential (primary) hypertension: Secondary | ICD-10-CM

## 2013-07-26 DIAGNOSIS — R5381 Other malaise: Secondary | ICD-10-CM

## 2013-07-26 DIAGNOSIS — R5383 Other fatigue: Secondary | ICD-10-CM

## 2013-07-26 DIAGNOSIS — I309 Acute pericarditis, unspecified: Secondary | ICD-10-CM

## 2013-07-26 DIAGNOSIS — R251 Tremor, unspecified: Secondary | ICD-10-CM

## 2013-07-26 DIAGNOSIS — R259 Unspecified abnormal involuntary movements: Secondary | ICD-10-CM

## 2013-07-26 DIAGNOSIS — Z7901 Long term (current) use of anticoagulants: Secondary | ICD-10-CM

## 2013-07-26 DIAGNOSIS — I4891 Unspecified atrial fibrillation: Secondary | ICD-10-CM

## 2013-07-26 LAB — CBC WITH DIFFERENTIAL/PLATELET
Basophils Absolute: 0 10*3/uL (ref 0.0–0.1)
Basophils Relative: 0.5 % (ref 0.0–3.0)
EOS ABS: 0.2 10*3/uL (ref 0.0–0.7)
Eosinophils Relative: 3.1 % (ref 0.0–5.0)
HCT: 40.2 % (ref 36.0–46.0)
Hemoglobin: 13.5 g/dL (ref 12.0–15.0)
LYMPHS PCT: 32.6 % (ref 12.0–46.0)
Lymphs Abs: 2.2 10*3/uL (ref 0.7–4.0)
MCHC: 33.6 g/dL (ref 30.0–36.0)
MCV: 98.3 fl (ref 78.0–100.0)
Monocytes Absolute: 0.5 10*3/uL (ref 0.1–1.0)
Monocytes Relative: 6.9 % (ref 3.0–12.0)
NEUTROS PCT: 56.9 % (ref 43.0–77.0)
Neutro Abs: 3.9 10*3/uL (ref 1.4–7.7)
PLATELETS: 209 10*3/uL (ref 150.0–400.0)
RBC: 4.09 Mil/uL (ref 3.87–5.11)
RDW: 13.5 % (ref 11.5–15.5)
WBC: 6.9 10*3/uL (ref 4.0–10.5)

## 2013-07-26 LAB — HEPATIC FUNCTION PANEL
ALK PHOS: 85 U/L (ref 39–117)
ALT: 27 U/L (ref 0–35)
AST: 32 U/L (ref 0–37)
Albumin: 3.8 g/dL (ref 3.5–5.2)
BILIRUBIN TOTAL: 0.5 mg/dL (ref 0.2–1.2)
Bilirubin, Direct: 0.1 mg/dL (ref 0.0–0.3)
Total Protein: 7.1 g/dL (ref 6.0–8.3)

## 2013-07-26 LAB — URINALYSIS, ROUTINE W REFLEX MICROSCOPIC
Bilirubin Urine: NEGATIVE
Hgb urine dipstick: NEGATIVE
KETONES UR: NEGATIVE
Nitrite: NEGATIVE
PH: 5.5 (ref 5.0–8.0)
SPECIFIC GRAVITY, URINE: 1.02 (ref 1.000–1.030)
Total Protein, Urine: NEGATIVE
URINE GLUCOSE: NEGATIVE
Urobilinogen, UA: 0.2 (ref 0.0–1.0)

## 2013-07-26 LAB — BASIC METABOLIC PANEL
BUN: 25 mg/dL — ABNORMAL HIGH (ref 6–23)
CO2: 24 mEq/L (ref 19–32)
Calcium: 9.1 mg/dL (ref 8.4–10.5)
Chloride: 104 mEq/L (ref 96–112)
Creatinine, Ser: 1 mg/dL (ref 0.4–1.2)
GFR: 59.39 mL/min — ABNORMAL LOW (ref >60.00)
Glucose, Bld: 139 mg/dL — ABNORMAL HIGH (ref 70–99)
POTASSIUM: 5.2 meq/L — AB (ref 3.5–5.1)
SODIUM: 135 meq/L (ref 135–145)

## 2013-07-26 LAB — BRAIN NATRIURETIC PEPTIDE: PRO B NATRI PEPTIDE: 210 pg/mL — AB (ref 0.0–100.0)

## 2013-07-26 LAB — MAGNESIUM: Magnesium: 2 mg/dL (ref 1.5–2.5)

## 2013-07-26 LAB — SEDIMENTATION RATE: SED RATE: 14 mm/h (ref 0–22)

## 2013-07-30 ENCOUNTER — Ambulatory Visit (HOSPITAL_COMMUNITY): Payer: Medicare Other | Attending: Cardiology | Admitting: Cardiology

## 2013-07-30 DIAGNOSIS — R0989 Other specified symptoms and signs involving the circulatory and respiratory systems: Secondary | ICD-10-CM | POA: Insufficient documentation

## 2013-07-30 DIAGNOSIS — I4891 Unspecified atrial fibrillation: Secondary | ICD-10-CM | POA: Insufficient documentation

## 2013-07-30 DIAGNOSIS — R5383 Other fatigue: Secondary | ICD-10-CM

## 2013-07-30 DIAGNOSIS — R0609 Other forms of dyspnea: Secondary | ICD-10-CM | POA: Insufficient documentation

## 2013-07-30 DIAGNOSIS — I059 Rheumatic mitral valve disease, unspecified: Secondary | ICD-10-CM | POA: Insufficient documentation

## 2013-07-30 DIAGNOSIS — I309 Acute pericarditis, unspecified: Secondary | ICD-10-CM

## 2013-07-30 DIAGNOSIS — I10 Essential (primary) hypertension: Secondary | ICD-10-CM | POA: Insufficient documentation

## 2013-07-30 DIAGNOSIS — I517 Cardiomegaly: Secondary | ICD-10-CM | POA: Insufficient documentation

## 2013-07-30 DIAGNOSIS — I079 Rheumatic tricuspid valve disease, unspecified: Secondary | ICD-10-CM | POA: Insufficient documentation

## 2013-07-30 DIAGNOSIS — I319 Disease of pericardium, unspecified: Secondary | ICD-10-CM | POA: Insufficient documentation

## 2013-07-30 DIAGNOSIS — I509 Heart failure, unspecified: Secondary | ICD-10-CM | POA: Insufficient documentation

## 2013-07-30 DIAGNOSIS — I482 Chronic atrial fibrillation, unspecified: Secondary | ICD-10-CM

## 2013-07-30 NOTE — Progress Notes (Signed)
Echo performed. 

## 2013-07-31 LAB — PROTEIN ELECTROPHORESIS, SERUM
Albumin ELP: 59.7 % (ref 55.8–66.1)
Alpha-1-Globulin: 4 % (ref 2.9–4.9)
Alpha-2-Globulin: 9 % (ref 7.1–11.8)
Beta 2: 5.3 % (ref 3.2–6.5)
Beta Globulin: 7.1 % (ref 4.7–7.2)
Gamma Globulin: 14.9 % (ref 11.1–18.8)
Total Protein, Serum Electrophoresis: 6.6 g/dL (ref 6.0–8.3)

## 2013-08-20 ENCOUNTER — Encounter: Payer: Self-pay | Admitting: Cardiology

## 2013-09-24 ENCOUNTER — Other Ambulatory Visit: Payer: Self-pay

## 2013-09-24 MED ORDER — ZOLPIDEM TARTRATE 10 MG PO TABS: ORAL_TABLET | ORAL | Status: DC

## 2013-09-27 ENCOUNTER — Telehealth: Payer: Self-pay | Admitting: Internal Medicine

## 2013-09-27 ENCOUNTER — Other Ambulatory Visit: Payer: Self-pay

## 2013-09-27 MED ORDER — ZOLPIDEM TARTRATE 10 MG PO TABS: ORAL_TABLET | ORAL | Status: DC

## 2013-09-27 NOTE — Telephone Encounter (Signed)
Patient states her pharmacy [facility] did not receive the phone in script for zolpidem.  She is requesting it to be resent.

## 2013-09-27 NOTE — Telephone Encounter (Signed)
Notified pharmacy spoke with Ben/pharmacist gave md approval for zolpidem. Notified pt med has been call into pharmacy...Raechel Chute

## 2013-10-29 ENCOUNTER — Ambulatory Visit (INDEPENDENT_AMBULATORY_CARE_PROVIDER_SITE_OTHER): Payer: Medicare Other | Admitting: *Deleted

## 2013-10-29 DIAGNOSIS — Z23 Encounter for immunization: Secondary | ICD-10-CM

## 2013-11-06 ENCOUNTER — Other Ambulatory Visit: Payer: Self-pay | Admitting: Internal Medicine

## 2013-12-16 ENCOUNTER — Other Ambulatory Visit: Payer: Self-pay | Admitting: Internal Medicine

## 2013-12-17 ENCOUNTER — Telehealth: Payer: Self-pay | Admitting: *Deleted

## 2013-12-17 ENCOUNTER — Other Ambulatory Visit: Payer: Self-pay | Admitting: *Deleted

## 2013-12-17 MED ORDER — RIVAROXABAN 15 MG PO TABS: 15.0000 mg | ORAL_TABLET | Freq: Every day | ORAL | Status: DC

## 2013-12-17 NOTE — Telephone Encounter (Signed)
Xarelto samples placed at the front desk for patient. 

## 2013-12-18 ENCOUNTER — Ambulatory Visit: Payer: Medicare Other | Admitting: Internal Medicine

## 2014-01-23 ENCOUNTER — Ambulatory Visit (INDEPENDENT_AMBULATORY_CARE_PROVIDER_SITE_OTHER): Payer: Medicare Other | Admitting: Internal Medicine

## 2014-01-23 ENCOUNTER — Encounter: Payer: Self-pay | Admitting: Internal Medicine

## 2014-01-23 VITALS — BP 130/84 | HR 78 | Temp 98.0°F | Ht 62.0 in | Wt 121.0 lb

## 2014-01-23 DIAGNOSIS — I480 Paroxysmal atrial fibrillation: Secondary | ICD-10-CM

## 2014-01-23 DIAGNOSIS — R7309 Other abnormal glucose: Secondary | ICD-10-CM | POA: Insufficient documentation

## 2014-01-23 DIAGNOSIS — I10 Essential (primary) hypertension: Secondary | ICD-10-CM

## 2014-01-23 DIAGNOSIS — R5383 Other fatigue: Secondary | ICD-10-CM

## 2014-01-23 DIAGNOSIS — G459 Transient cerebral ischemic attack, unspecified: Secondary | ICD-10-CM

## 2014-01-23 MED ORDER — ZOLPIDEM TARTRATE 10 MG PO TABS: ORAL_TABLET | ORAL | Status: DC

## 2014-01-23 NOTE — Progress Notes (Signed)
   Subjective:     HPI  former Dr Tobin ChadMEN's F/u HTN, TIA, anticoagulation /o stress w/husband F/u - fatigue on Bystolic - better since she started to use it at night  BP Readings from Last 3 Encounters:  01/23/14 130/84  07/02/13 130/74  05/08/13 134/90   Wt Readings from Last 3 Encounters:  01/23/14 121 lb (54.885 kg)  07/02/13 124 lb (56.246 kg)  05/08/13 120 lb (54.432 kg)       Review of Systems  Constitutional: Negative for chills, activity change, appetite change, fatigue and unexpected weight change.  HENT: Negative for congestion, mouth sores and sinus pressure.   Eyes: Negative for visual disturbance.  Respiratory: Positive for shortness of breath. Negative for cough and chest tightness.   Gastrointestinal: Negative for nausea and abdominal pain.  Genitourinary: Negative for frequency, difficulty urinating and vaginal pain.  Musculoskeletal: Negative for back pain and gait problem.  Skin: Negative for pallor and rash.  Neurological: Positive for weakness. Negative for dizziness, tremors, numbness and headaches.  Psychiatric/Behavioral: Negative for suicidal ideas, confusion and sleep disturbance. The patient is not nervous/anxious.        Objective:   Physical Exam  Constitutional: She appears well-developed and well-nourished. No distress.  HENT:  Head: Normocephalic.  Right Ear: External ear normal.  Left Ear: External ear normal.  Nose: Nose normal.  Mouth/Throat: Oropharynx is clear and moist.  Eyes: Conjunctivae are normal. Pupils are equal, round, and reactive to light. Right eye exhibits no discharge. Left eye exhibits no discharge.  Neck: Normal range of motion. Neck supple. No JVD present. No tracheal deviation present. No thyromegaly present.  Cardiovascular: Normal rate, regular rhythm and normal heart sounds.   Pulmonary/Chest: No stridor. No respiratory distress. She has no wheezes.  Abdominal: Soft. Bowel sounds are normal. She exhibits no  distension and no mass. There is no tenderness. There is no rebound and no guarding.  Musculoskeletal: She exhibits no edema or tenderness.  Lymphadenopathy:    She has no cervical adenopathy.  Neurological: She displays normal reflexes. No cranial nerve deficit. She exhibits normal muscle tone. Coordination normal.  Skin: No rash noted. No erythema.  Psychiatric: She has a normal mood and affect. Her behavior is normal. Judgment and thought content normal.    Lab Results  Component Value Date   WBC 6.9 07/26/2013   HGB 13.5 07/26/2013   HCT 40.2 07/26/2013   PLT 209.0 07/26/2013   GLUCOSE 139* 07/26/2013   CHOL 125 02/06/2012   TRIG 40 02/06/2012   HDL 91 02/06/2012   LDLCALC 26 02/06/2012   ALT 27 07/26/2013   AST 32 07/26/2013   NA 135 07/26/2013   K 5.2* 07/26/2013   CL 104 07/26/2013   CREATININE 1.0 07/26/2013   BUN 25* 07/26/2013   CO2 24 07/26/2013   TSH 0.87 04/16/2013   INR 2.03* 02/29/2012         Assessment & Plan:

## 2014-01-23 NOTE — Patient Instructions (Addendum)
Take Bystolic at night Rest more!

## 2014-01-23 NOTE — Assessment & Plan Note (Signed)
Continue with current prescription therapy as reflected on the Med list.  

## 2014-01-23 NOTE — Addendum Note (Signed)
Addended by: Tresa GarterPLOTNIKOV, Lillieann Pavlich V on: 01/23/2014 10:51 AM   Modules accepted: Orders

## 2014-01-23 NOTE — Assessment & Plan Note (Signed)
Intermittent Continue with current prescription therapy as reflected on the Med list.

## 2014-01-23 NOTE — Assessment & Plan Note (Signed)
A1c

## 2014-01-23 NOTE — Assessment & Plan Note (Signed)
Better  Rest more

## 2014-01-23 NOTE — Assessment & Plan Note (Signed)
Continue with current prescription therapy as reflected on the Med list. BP Readings from Last 3 Encounters:  01/23/14 130/84  07/02/13 130/74  05/08/13 134/90

## 2014-01-24 ENCOUNTER — Telehealth: Payer: Self-pay | Admitting: Internal Medicine

## 2014-01-24 NOTE — Telephone Encounter (Signed)
emmi emailed °

## 2014-01-28 ENCOUNTER — Other Ambulatory Visit (INDEPENDENT_AMBULATORY_CARE_PROVIDER_SITE_OTHER): Payer: Medicare Other | Admitting: *Deleted

## 2014-01-28 DIAGNOSIS — R5383 Other fatigue: Secondary | ICD-10-CM

## 2014-01-28 DIAGNOSIS — I480 Paroxysmal atrial fibrillation: Secondary | ICD-10-CM

## 2014-01-28 DIAGNOSIS — R7309 Other abnormal glucose: Secondary | ICD-10-CM

## 2014-01-28 LAB — BASIC METABOLIC PANEL
BUN: 22 mg/dL (ref 6–23)
CO2: 24 meq/L (ref 19–32)
Calcium: 8.7 mg/dL (ref 8.4–10.5)
Chloride: 109 mEq/L (ref 96–112)
Creatinine, Ser: 0.8 mg/dL (ref 0.4–1.2)
GFR: 78.59 mL/min (ref >60.00)
GLUCOSE: 90 mg/dL (ref 70–99)
POTASSIUM: 3.7 meq/L (ref 3.5–5.1)
SODIUM: 141 meq/L (ref 135–145)

## 2014-01-28 LAB — HEMOGLOBIN A1C: HEMOGLOBIN A1C: 6.5 % (ref 4.6–6.5)

## 2014-02-07 ENCOUNTER — Encounter (HOSPITAL_COMMUNITY): Payer: Self-pay | Admitting: Cardiology

## 2014-02-19 ENCOUNTER — Telehealth: Payer: Self-pay | Admitting: Internal Medicine

## 2014-02-19 NOTE — Telephone Encounter (Signed)
New message    Pt C/O medication issue:  1. Name of Medication: bystolic 2.5 mg   2. How are you currently taking this medication (dosage and times per day)? Yes   3. Are you having a reaction (difficulty breathing--STAT)? No   4. What is your medication issue? bcbs is stating they are not going to pay for her medication - need something in written from Dr. Adrian BlackwaterKlein  Stating why the patient need to continue medication.

## 2014-02-19 NOTE — Telephone Encounter (Signed)
Patient gave me [employer] number to call about this issue. [phone] or [phone]. Patient aware I will call her once I have spoken with insurance.

## 2014-03-04 ENCOUNTER — Telehealth: Payer: Self-pay | Admitting: Internal Medicine

## 2014-03-04 NOTE — Telephone Encounter (Signed)
Informed patient information for non-formulary exception form [facility].   I will call them to check on the status of this as patient only has 7 days of medication left. She is agreeable to this.

## 2014-03-04 NOTE — Telephone Encounter (Signed)
New message      [employer] will not cover bystolic 2.5.  Pt called 2wk ago.  Please call her and give her an update on what is going on with [employer] and getting rx covered.

## 2014-03-04 NOTE — Telephone Encounter (Signed)
Spoke with [employer] who tells me that answer should be in by the end of the day.

## 2014-03-05 ENCOUNTER — Encounter: Payer: Self-pay | Admitting: Internal Medicine

## 2014-03-06 NOTE — Telephone Encounter (Signed)
Efraim KaufmannMelissa is working on Web designergetting approval.  She needed info on previous meds tried, exact reason, diagnosis code.  Reviewed this with her, and informed her that patient has been on this medication since March 2015, and it is working. She will let me know when decision is reached.

## 2014-03-06 NOTE — Telephone Encounter (Signed)
F/U        [employer] calling, [employer]Chasity Tendall about expedited part D appeal request.   States there is a 72 hr turnaround time, and case will be routed to an analyst today. Information may be faxed to [phone].  Medication is Bystolic.  Chasity may be reached at [phone] if any questions.

## 2014-03-06 NOTE — Telephone Encounter (Signed)
F/U     [employer] calling, Melissa the analyst is calling to get additional information for pt.    Please return call today if possible [phone] that's directly to the desk of analyst who received info from rep this morning.

## 2014-03-07 ENCOUNTER — Telehealth: Payer: Self-pay | Admitting: Internal Medicine

## 2014-03-07 NOTE — Telephone Encounter (Signed)
New Message     The medication Biosylic is being overturned and approved.     [id number] (correct number)   Thanks

## 2014-03-07 NOTE — Telephone Encounter (Signed)
New Message      Patient would like a call back on previous phone note. She is concerned about bystolic 5mg . Her insurance says that they will not pay for it . She needs to speak with someone.  Thanks

## 2014-03-07 NOTE — Telephone Encounter (Signed)
Pt states she is calling to see if a decision had been made by BC/BSabout authorizing Bystolic.  I gave pt information from notes from yesterday. Pt states she has 7 bystolic left and is asking for options if BC/BS will not authorize Bystolic. Pt advised I will forward to Sherri/Dr Graciela HusbandsKlein to follow up with her.

## 2014-03-07 NOTE — Telephone Encounter (Signed)
Spoke with Melissa who states denial for Bystolic has been overturned and it is now approved. I placed call to pt to give her this information. Left message to call back

## 2014-03-08 MED ORDER — NEBIVOLOL HCL 2.5 MG PO TABS: 2.5000 mg | ORAL_TABLET | Freq: Every day | ORAL | Status: DC

## 2014-03-08 NOTE — Telephone Encounter (Signed)
Informed patient of Bystolic approval.  She verbalized understanding and thankful for helping get this done so quickly.

## 2014-03-14 ENCOUNTER — Telehealth: Payer: Self-pay | Admitting: Internal Medicine

## 2014-03-14 DIAGNOSIS — M810 Age-related osteoporosis without current pathological fracture: Secondary | ICD-10-CM

## 2014-03-14 NOTE — Telephone Encounter (Signed)
Ok Thx 

## 2014-03-14 NOTE — Telephone Encounter (Signed)
Please help, order is in

## 2014-03-14 NOTE — Telephone Encounter (Signed)
[phone]. Please help, pt is trying to get this done ASAP.

## 2014-03-18 ENCOUNTER — Other Ambulatory Visit: Payer: Self-pay | Admitting: Internal Medicine

## 2014-03-18 DIAGNOSIS — Z1231 Encounter for screening mammogram for malignant neoplasm of breast: Secondary | ICD-10-CM

## 2014-03-19 NOTE — Telephone Encounter (Signed)
Pt is scheduled for 03/25/14.

## 2014-03-25 ENCOUNTER — Ambulatory Visit
Admission: RE | Admit: 2014-03-25 | Discharge: 2014-03-25 | Disposition: A | Discharge status: Home / Self Care | Payer: Medicare Other | Source: Ambulatory Visit | Attending: Internal Medicine | Admitting: Internal Medicine

## 2014-03-25 DIAGNOSIS — Z1231 Encounter for screening mammogram for malignant neoplasm of breast: Secondary | ICD-10-CM

## 2014-03-25 DIAGNOSIS — M810 Age-related osteoporosis without current pathological fracture: Secondary | ICD-10-CM

## 2014-04-17 ENCOUNTER — Other Ambulatory Visit: Payer: Self-pay | Admitting: Internal Medicine

## 2014-05-10 ENCOUNTER — Telehealth: Payer: Self-pay

## 2014-05-10 NOTE — Telephone Encounter (Signed)
Received pharmacy rejection stating that insurance will not cover zolpidem without a prior authorization. PA submitted via covermymeds, approval now pending their decision. [employer] will review the request and notify you of the determination decision directly, typically within 3 business days of your submission and once all necessary information is received

## 2014-05-14 ENCOUNTER — Ambulatory Visit (INDEPENDENT_AMBULATORY_CARE_PROVIDER_SITE_OTHER): Payer: Medicare Other | Admitting: Internal Medicine

## 2014-05-14 ENCOUNTER — Encounter: Payer: Self-pay | Admitting: Internal Medicine

## 2014-05-14 VITALS — BP 140/74 | HR 118 | Ht 62.5 in | Wt 120.0 lb

## 2014-05-14 DIAGNOSIS — I4891 Unspecified atrial fibrillation: Secondary | ICD-10-CM

## 2014-05-14 MED ORDER — RIVAROXABAN 20 MG PO TABS: 20.0000 mg | ORAL_TABLET | Freq: Every day | ORAL | Status: DC

## 2014-05-14 MED ORDER — DILTIAZEM HCL ER COATED BEADS 240 MG PO CP24: 240.0000 mg | ORAL_CAPSULE | Freq: Every day | ORAL | Status: DC

## 2014-05-14 MED ORDER — NEBIVOLOL HCL 5 MG PO TABS: 5.0000 mg | ORAL_TABLET | Freq: Every day | ORAL | Status: DC

## 2014-05-14 NOTE — Progress Notes (Signed)
Patient Care Team: Tresa GarterAleksei Plotnikov V, MD as PCP - General (Internal Medicine)   HPI  Selena Taylor is a 79 y.o. female Seen in followup for atrial fibrillation and pericarditis. Because of persistent symptoms she had undergone a pericardial window.  She takes Rivaroxaban    There has been no significant chest pain or shortness of breath   her husband situation continued to deteriorate. It has been very stressful.    When she was seen last year her heart rate was fast. We started her on low-dose bisoprolol. This was associated with improvement in her resting rate to the 90s.     Past Medical History  Diagnosis Date  . TIA (transient ischemic attack)   . PAF (paroxysmal atrial fibrillation)     has been on Flecainide in the past. Stopped 02/10/11; Remains on coumadin anticoagulation; intol to Rythmol and failed DCCV; noted to be in AFlutter 5/13;  Echo 01/2011:  EF 55-60%, mild MR, mild LAE.  Myoview 6/12:  No ischemia, EF 74%  . HTN (hypertension)   . Endometriosis   . Osteopenia   . Insomnia   . Chronic anticoagulation     on coumadin  . Anxiety   . Pericardial effusion   . Shortness of breath   . Pericardial effusion 02/23/2012  . CHF (congestive heart failure)     Past Surgical History  Procedure Laterality Date  . Polypectomy    . Total hysterectomy and bilateral salpingoopherectomy    . Appendectomy    . Skin cancer removal    . Cardioversion  01/14/2011    Procedure: CARDIOVERSION;  Surgeon: Duke SalviaSteven C Klein, MD;  [facility];  Service: Cardiovascular;  Laterality: N/A;  To be completed in Neuro OR 33 time slot 0830 12/20  . Cardioversion  05/27/2011    Procedure: CARDIOVERSION;  Surgeon: Duke SalviaSteven C Klein, MD;  [facility];  Service: Cardiovascular;  Laterality: N/A;  . Pericardial window  02/25/2012    Procedure: PERICARDIAL WINDOW;  Surgeon: Delight OvensEdward B Gerhardt, MD;  [facility];  Service: Thoracic;  Laterality: N/A;  . Tee without cardioversion   02/25/2012    Procedure: TRANSESOPHAGEAL ECHOCARDIOGRAM (TEE);  Surgeon: Delight OvensEdward B Gerhardt, MD;  [facility];  Service: Thoracic;  Laterality: N/A;    Current Outpatient Prescriptions  Medication Sig Dispense Refill  . cholecalciferol (VITAMIN D) 1000 UNITS tablet Take 1 tablet (1,000 Units total) by mouth daily. 100 tablet 3  . diltiazem (CARTIA XT) 240 MG 24 hr capsule Take 1 capsule (240 mg total) by mouth daily. 30 capsule 6  . Grape Seed 100 MG CAPS Take 1 capsule by mouth daily.     . nebivolol (BYSTOLIC) 2.5 MG tablet Take 1 tablet (2.5 mg total) by mouth daily. 30 tablet 2  . Omega-3 Fatty Acids (FISH OIL) 1000 MG CAPS Take 1,000 mg by mouth daily.     . ramipril (ALTACE) 10 MG capsule TAKE ONE CAPSULE BY MOUTH EVERY DAY 30 capsule 12  . Rivaroxaban (XARELTO) 15 MG TABS tablet Take 1 tablet (15 mg total) by mouth daily. 30 tablet 5  . zolpidem (AMBIEN) 10 MG tablet TAKE 1 TABLET BY MOUTH EVERY DAY AT BEDTIME AS NEEDED 30 tablet 5  . [DISCONTINUED] diltiazem (DILACOR XR) 240 MG 24 hr capsule Take 1 capsule (240 mg total) by mouth daily. 30 capsule 11   No current facility-administered medications for this visit.    Allergies  Allergen Reactions  . Other  Other (See Comments)    Novocaine.  REACTION:  Rapid heart rate  . Procaine Hcl     Rapid heart rate    Review of Systems negative except from HPI and PMH  Physical Exam BP 140/74 mmHg  Pulse 118  Ht 5' 2.5" (1.588 m)  Wt 120 lb (54.432 kg)  BMI 21.59 kg/m2 Well developed and well nourished in no acute distress HENT normal E scleral and icterus clear Neck Supple JVP 6-7t; carotids brisk and full Clear to ausculation Irregular irregular and rapid  Follow-up O rate and rhythm, no murmurs gallops or rub Soft with active bowel sounds No clubbing cyanosis  Edema Alert and oriented, grossly normal motor and sensory function Skin Warm and Dry   ECG was ordered today demonstrates atrial fibrillation at a rate of  118 Intervals-/07/31 so we are going give her a work 3 and also her there is a 50 primary 22nd so 22nd  Assessment and  Plan  Atrial fibrillation with a rapid rate  Renal insufficiency  Hypertension    her blood pressure is reasonably controlled. Rate atrial fibrillation rate is excessively fast. She has tolerated the nebivolol , and we will increase it from 2.5--5 mg daily. She will continue on Rivaroxaban  For anticoagulation  . She remains under a great deal of stress with her husband's dementia.  Last Cr  Was 0.01 February 2014 potassium was 3.7.   renal function is improved considerably and would not be appropriate to increase Rivaroxaban  From 15--20

## 2014-05-14 NOTE — Patient Instructions (Signed)
Medication Instructions:  Your physician has recommended you make the following change in your medication:  Increase Bystolic to 5 mg by mouth daily. Increase Xarelto to 20 mg by mouth daily.    Labwork: none  Testing/Procedures: none  Follow-Up: Your physician wants you to follow-up in: 12 months.  You will receive a reminder letter in the mail two months in advance. If you don't receive a letter, please call our office to schedule the follow-up appointment.    .Marland Kitchen

## 2014-05-15 ENCOUNTER — Ambulatory Visit: Payer: Medicare Other

## 2014-05-15 ENCOUNTER — Telehealth: Payer: Self-pay | Admitting: *Deleted

## 2014-05-15 ENCOUNTER — Other Ambulatory Visit: Payer: Self-pay | Admitting: Internal Medicine

## 2014-05-15 NOTE — Telephone Encounter (Signed)
Zolpidem 10 mg PA is approved. Left detailed mess informing pt.

## 2014-06-14 ENCOUNTER — Telehealth: Payer: Self-pay | Admitting: Internal Medicine

## 2014-06-14 NOTE — Telephone Encounter (Signed)
Patient wanting to clarify how much Cartia she should be taking as she was under the assumption she should be taking this once daily. Confirmed with patient that she should be taking it once daily. She then explains that the rx she picked up tells her to take it BID.  I instructed her to take daily and that I would address this with person who sent in refill. She thanked me for helping.

## 2014-06-14 NOTE — Telephone Encounter (Signed)
New message      Talk to a nurse regarding her medications.  Please call after 4pm

## 2014-06-14 NOTE — Telephone Encounter (Signed)
F/u ° ° °Pt waiting on call from nurse.  °

## 2014-06-25 ENCOUNTER — Other Ambulatory Visit: Payer: Self-pay | Admitting: Internal Medicine

## 2014-07-17 ENCOUNTER — Other Ambulatory Visit: Payer: Self-pay | Admitting: Internal Medicine

## 2014-07-22 ENCOUNTER — Other Ambulatory Visit: Payer: Self-pay

## 2014-08-13 ENCOUNTER — Ambulatory Visit: Payer: Medicare Other

## 2014-08-14 ENCOUNTER — Ambulatory Visit (INDEPENDENT_AMBULATORY_CARE_PROVIDER_SITE_OTHER): Payer: Medicare Other

## 2014-08-14 DIAGNOSIS — Z23 Encounter for immunization: Secondary | ICD-10-CM

## 2014-09-13 ENCOUNTER — Telehealth: Payer: Self-pay | Admitting: Internal Medicine

## 2014-09-13 NOTE — Telephone Encounter (Signed)
Patient is requesting script for Remus Loffler to be sent over to [facility] on [address]

## 2014-09-15 NOTE — Telephone Encounter (Signed)
OK to fill this prescription with additional refills x2 Thank you!  

## 2014-09-16 MED ORDER — ZOLPIDEM TARTRATE 10 MG PO TABS: 10.0000 mg | ORAL_TABLET | Freq: Every evening | ORAL | Status: DC | PRN

## 2014-09-16 NOTE — Telephone Encounter (Signed)
Rx printed/faxed to

## 2014-09-23 ENCOUNTER — Telehealth: Payer: Self-pay

## 2014-09-23 NOTE — Telephone Encounter (Signed)
Call to educate on AWV; Stated she did want to discuss some things with Dr. Macario Golds and would like to get AWV over prior to seeing him. Will come in at 9:15 prior to 9:45 apt on 9/12 to complete AWV. Discussed flu shot and preventive;

## 2014-10-07 ENCOUNTER — Encounter: Payer: Self-pay | Admitting: Internal Medicine

## 2014-10-07 ENCOUNTER — Ambulatory Visit (INDEPENDENT_AMBULATORY_CARE_PROVIDER_SITE_OTHER): Payer: Medicare Other | Admitting: Internal Medicine

## 2014-10-07 VITALS — BP 120/70 | HR 81 | Ht 62.0 in | Wt 121.5 lb

## 2014-10-07 DIAGNOSIS — R5382 Chronic fatigue, unspecified: Secondary | ICD-10-CM

## 2014-10-07 DIAGNOSIS — G459 Transient cerebral ischemic attack, unspecified: Secondary | ICD-10-CM

## 2014-10-07 DIAGNOSIS — I4819 Other persistent atrial fibrillation: Secondary | ICD-10-CM

## 2014-10-07 DIAGNOSIS — R202 Paresthesia of skin: Secondary | ICD-10-CM

## 2014-10-07 DIAGNOSIS — Z0000 Encounter for general adult medical examination without abnormal findings: Secondary | ICD-10-CM | POA: Diagnosis not present

## 2014-10-07 DIAGNOSIS — I10 Essential (primary) hypertension: Secondary | ICD-10-CM

## 2014-10-07 DIAGNOSIS — R5383 Other fatigue: Secondary | ICD-10-CM

## 2014-10-07 DIAGNOSIS — I481 Persistent atrial fibrillation: Secondary | ICD-10-CM | POA: Diagnosis not present

## 2014-10-07 DIAGNOSIS — Z23 Encounter for immunization: Secondary | ICD-10-CM | POA: Diagnosis not present

## 2014-10-07 MED ORDER — ZOLPIDEM TARTRATE 10 MG PO TABS: 10.0000 mg | ORAL_TABLET | Freq: Every evening | ORAL | Status: DC | PRN

## 2014-10-07 NOTE — Assessment & Plan Note (Signed)
On Xarelto, Bystolic, Cartia 

## 2014-10-07 NOTE — Addendum Note (Signed)
Addended by: Merrilyn Puma on: 10/07/2014 11:40 AM   Modules accepted: Orders

## 2014-10-07 NOTE — Patient Instructions (Addendum)
Selena Taylor , Thank you for taking time to come for your Medicare Wellness Visit. I appreciate your ongoing commitment to your health goals. Please review the following plan we discussed and let me know if I can assist you in the future.    These are the goals we discussed: Goals    . Exercise 150 minutes per week (moderate activity)     Wants to start walking again; 2 miles when the weather is cooler; Will start some form of weight bearing exercise to assist with osteo and blood sugar control        This is a list of the screening recommended for you and due dates:  Health Maintenance  Topic Date Due  . Colon Cancer Screening  11/08/1987  . Flu Shot  08/26/2014  . Tetanus Vaccine  08/12/2019  . DEXA scan (bone density measurement)  Completed  . Shingles Vaccine  Completed  . Pneumonia vaccines  Completed    Preventive Care for Adults A healthy lifestyle and preventive care can promote health and wellness. Preventive health guidelines for women include the following key practices.  A routine yearly physical is a good way to check with your health care provider about your health and preventive screening. It is a chance to share any concerns and updates on your health and to receive a thorough exam.  Visit your dentist for a routine exam and preventive care every 6 months. Brush your teeth twice a day and floss once a day. Good oral hygiene prevents tooth decay and gum disease.  The frequency of eye exams is based on your age, health, family medical history, use of contact lenses, and other factors. Follow your health care provider's recommendations for frequency of eye exams.  Eat a healthy diet. Foods like vegetables, fruits, whole grains, low-fat dairy products, and lean protein foods contain the nutrients you need without too many calories. Decrease your intake of foods high in solid fats, added sugars, and salt. Eat the right amount of calories for you.Get information about a  proper diet from your health care provider, if necessary.  Regular physical exercise is one of the most important things you can do for your health. Most adults should get at least 150 minutes of moderate-intensity exercise (any activity that increases your heart rate and causes you to sweat) each week. In addition, most adults need muscle-strengthening exercises on 2 or more days a week.  Maintain a healthy weight. The body mass index (BMI) is a screening tool to identify possible weight problems. It provides an estimate of body fat based on height and weight. Your health care provider can find your BMI and can help you achieve or maintain a healthy weight.For adults 20 years and older:  A BMI below 18.5 is considered underweight.  A BMI of 18.5 to 24.9 is normal.  A BMI of 25 to 29.9 is considered overweight.  A BMI of 30 and above is considered obese.  Maintain normal blood lipids and cholesterol levels by exercising and minimizing your intake of saturated fat. Eat a balanced diet with plenty of fruit and vegetables. Blood tests for lipids and cholesterol should begin at age 66 and be repeated every 5 years. If your lipid or cholesterol levels are high, you are over 50, or you are at high risk for heart disease, you may need your cholesterol levels checked more frequently.Ongoing high lipid and cholesterol levels should be treated with medicines if diet and exercise are not working.  If  you smoke, find out from your health care provider how to quit. If you do not use tobacco, do not start.  Lung cancer screening is recommended for adults aged 55-80 years who are at high risk for developing lung cancer because of a history of smoking. A yearly low-dose CT scan of the lungs is recommended for people who have at least a 30-pack-year history of smoking and are a current smoker or have quit within the past 15 years. A pack year of smoking is smoking an average of 1 pack of cigarettes a day for 1  year (for example: 1 pack a day for 30 years or 2 packs a day for 15 years). Yearly screening should continue until the smoker has stopped smoking for at least 15 years. Yearly screening should be stopped for people who develop a health problem that would prevent them from having lung cancer treatment.  If you are pregnant, do not drink alcohol. If you are breastfeeding, be very cautious about drinking alcohol. If you are not pregnant and choose to drink alcohol, do not have more than 1 drink per day. One drink is considered to be 12 ounces (355 mL) of beer, 5 ounces (148 mL) of wine, or 1.5 ounces (44 mL) of liquor.  Avoid use of street drugs. Do not share needles with anyone. Ask for help if you need support or instructions about stopping the use of drugs.  High blood pressure causes heart disease and increases the risk of stroke. Your blood pressure should be checked at least every 1 to 2 years. Ongoing high blood pressure should be treated with medicines if weight loss and exercise do not work.  If you are 55-79 years old, ask your health care provider if you should take aspirin to prevent strokes.  Diabetes screening involves taking a blood sample to check your fasting blood sugar level. This should be done once every 3 years, after age 45, if you are within normal weight and without risk factors for diabetes. Testing should be considered at a younger age or be carried out more frequently if you are overweight and have at least 1 risk factor for diabetes.  Breast cancer screening is essential preventive care for women. You should practice "breast self-awareness." This means understanding the normal appearance and feel of your breasts and may include breast self-examination. Any changes detected, no matter how small, should be reported to a health care provider. Women in their 20s and 30s should have a clinical breast exam (CBE) by a health care provider as part of a regular health exam every 1 to 3  years. After age 40, women should have a CBE every year. Starting at age 40, women should consider having a mammogram (breast X-ray test) every year. Women who have a family history of breast cancer should talk to their health care provider about genetic screening. Women at a high risk of breast cancer should talk to their health care providers about having an MRI and a mammogram every year.  Breast cancer gene (BRCA)-related cancer risk assessment is recommended for women who have family members with BRCA-related cancers. BRCA-related cancers include breast, ovarian, tubal, and peritoneal cancers. Having family members with these cancers may be associated with an increased risk for harmful changes (mutations) in the breast cancer genes BRCA1 and BRCA2. Results of the assessment will determine the need for genetic counseling and BRCA1 and BRCA2 testing.  Routine pelvic exams to screen for cancer are no longer recommended for nonpregnant   women who are considered low risk for cancer of the pelvic organs (ovaries, uterus, and vagina) and who do not have symptoms. Ask your health care provider if a screening pelvic exam is right for you.  If you have had past treatment for cervical cancer or a condition that could lead to cancer, you need Pap tests and screening for cancer for at least 20 years after your treatment. If Pap tests have been discontinued, your risk factors (such as having a new sexual partner) need to be reassessed to determine if screening should be resumed. Some women have medical problems that increase the chance of getting cervical cancer. In these cases, your health care provider may recommend more frequent screening and Pap tests.  The HPV test is an additional test that may be used for cervical cancer screening. The HPV test looks for the virus that can cause the cell changes on the cervix. The cells collected during the Pap test can be tested for HPV. The HPV test could be used to screen  women aged 10 years and older, and should be used in women of any age who have unclear Pap test results. After the age of 72, women should have HPV testing at the same frequency as a Pap test.  Colorectal cancer can be detected and often prevented. Most routine colorectal cancer screening begins at the age of 30 years and continues through age 50 years. However, your health care provider may recommend screening at an earlier age if you have risk factors for colon cancer. On a yearly basis, your health care provider may provide home test kits to check for hidden blood in the stool. Use of a small camera at the end of a tube, to directly examine the colon (sigmoidoscopy or colonoscopy), can detect the earliest forms of colorectal cancer. Talk to your health care provider about this at age 30, when routine screening begins. Direct exam of the colon should be repeated every 5-10 years through age 6 years, unless early forms of pre-cancerous polyps or small growths are found.  People who are at an increased risk for hepatitis B should be screened for this virus. You are considered at high risk for hepatitis B if:  You were born in a country where hepatitis B occurs often. Talk with your health care provider about which countries are considered high risk.  Your parents were born in a high-risk country and you have not received a shot to protect against hepatitis B (hepatitis B vaccine).  You have HIV or AIDS.  You use needles to inject street drugs.  You live with, or have sex with, someone who has hepatitis B.  You get hemodialysis treatment.  You take certain medicines for conditions like cancer, organ transplantation, and autoimmune conditions.  Hepatitis C blood testing is recommended for all people born from [birth year] through 1963 and any individual with known risks for hepatitis C.  Practice safe sex. Use condoms and avoid high-risk sexual practices to reduce the spread of sexually transmitted  infections (STIs). STIs include gonorrhea, chlamydia, syphilis, trichomonas, herpes, HPV, and human immunodeficiency virus (HIV). Herpes, HIV, and HPV are viral illnesses that have no cure. They can result in disability, cancer, and death.  You should be screened for sexually transmitted illnesses (STIs) including gonorrhea and chlamydia if:  You are sexually active and are younger than 24 years.  You are older than 24 years and your health care provider tells you that you are at risk for this type  of infection.  Your sexual activity has changed since you were last screened and you are at an increased risk for chlamydia or gonorrhea. Ask your health care provider if you are at risk.  If you are at risk of being infected with HIV, it is recommended that you take a prescription medicine daily to prevent HIV infection. This is called preexposure prophylaxis (PrEP). You are considered at risk if:  You are a heterosexual woman, are sexually active, and are at increased risk for HIV infection.  You take drugs by injection.  You are sexually active with a partner who has HIV.  Talk with your health care provider about whether you are at high risk of being infected with HIV. If you choose to begin PrEP, you should first be tested for HIV. You should then be tested every 3 months for as long as you are taking PrEP.  Osteoporosis is a disease in which the bones lose minerals and strength with aging. This can result in serious bone fractures or breaks. The risk of osteoporosis can be identified using a bone density scan. Women ages 65 years and over and women at risk for fractures or osteoporosis should discuss screening with their health care providers. Ask your health care provider whether you should take a calcium supplement or vitamin D to reduce the rate of osteoporosis.  Menopause can be associated with physical symptoms and risks. Hormone replacement therapy is available to decrease symptoms and  risks. You should talk to your health care provider about whether hormone replacement therapy is right for you.  Use sunscreen. Apply sunscreen liberally and repeatedly throughout the day. You should seek shade when your shadow is shorter than you. Protect yourself by wearing long sleeves, pants, a wide-brimmed hat, and sunglasses year round, whenever you are outdoors.  Once a month, do a whole body skin exam, using a mirror to look at the skin on your back. Tell your health care provider of new moles, moles that have irregular borders, moles that are larger than a pencil eraser, or moles that have changed in shape or color.  Stay current with required vaccines (immunizations).  Influenza vaccine. All adults should be immunized every year.  Tetanus, diphtheria, and acellular pertussis (Td, Tdap) vaccine. Pregnant women should receive 1 dose of Tdap vaccine during each pregnancy. The dose should be obtained regardless of the length of time since the last dose. Immunization is preferred during the 27th-36th week of gestation. An adult who has not previously received Tdap or who does not know her vaccine status should receive 1 dose of Tdap. This initial dose should be followed by tetanus and diphtheria toxoids (Td) booster doses every 10 years. Adults with an unknown or incomplete history of completing a 3-dose immunization series with Td-containing vaccines should begin or complete a primary immunization series including a Tdap dose. Adults should receive a Td booster every 10 years.  Varicella vaccine. An adult without evidence of immunity to varicella should receive 2 doses or a second dose if she has previously received 1 dose. Pregnant females who do not have evidence of immunity should receive the first dose after pregnancy. This first dose should be obtained before leaving the health care facility. The second dose should be obtained 4-8 weeks after the first dose.  Human papillomavirus (HPV)  vaccine. Females aged 13-26 years who have not received the vaccine previously should obtain the 3-dose series. The vaccine is not recommended for use in pregnant females. However, pregnancy testing   is not needed before receiving a dose. If a female is found to be pregnant after receiving a dose, no treatment is needed. In that case, the remaining doses should be delayed until after the pregnancy. Immunization is recommended for any person with an immunocompromised condition through the age of 26 years if she did not get any or all doses earlier. During the 3-dose series, the second dose should be obtained 4-8 weeks after the first dose. The third dose should be obtained 24 weeks after the first dose and 16 weeks after the second dose.  Zoster vaccine. One dose is recommended for adults aged 60 years or older unless certain conditions are present.  Measles, mumps, and rubella (MMR) vaccine. Adults born before 1957 generally are considered immune to measles and mumps. Adults born in 1957 or later should have 1 or more doses of MMR vaccine unless there is a contraindication to the vaccine or there is laboratory evidence of immunity to each of the three diseases. A routine second dose of MMR vaccine should be obtained at least 28 days after the first dose for students attending postsecondary schools, health care workers, or international travelers. People who received inactivated measles vaccine or an unknown type of measles vaccine during 1963-1967 should receive 2 doses of MMR vaccine. People who received inactivated mumps vaccine or an unknown type of mumps vaccine before 1979 and are at high risk for mumps infection should consider immunization with 2 doses of MMR vaccine. For females of childbearing age, rubella immunity should be determined. If there is no evidence of immunity, females who are not pregnant should be vaccinated. If there is no evidence of immunity, females who are pregnant should delay  immunization until after pregnancy. Unvaccinated health care workers born before 1957 who lack laboratory evidence of measles, mumps, or rubella immunity or laboratory confirmation of disease should consider measles and mumps immunization with 2 doses of MMR vaccine or rubella immunization with 1 dose of MMR vaccine.  Pneumococcal 13-valent conjugate (PCV13) vaccine. When indicated, a person who is uncertain of her immunization history and has no record of immunization should receive the PCV13 vaccine. An adult aged 19 years or older who has certain medical conditions and has not been previously immunized should receive 1 dose of PCV13 vaccine. This PCV13 should be followed with a dose of pneumococcal polysaccharide (PPSV23) vaccine. The PPSV23 vaccine dose should be obtained at least 8 weeks after the dose of PCV13 vaccine. An adult aged 19 years or older who has certain medical conditions and previously received 1 or more doses of PPSV23 vaccine should receive 1 dose of PCV13. The PCV13 vaccine dose should be obtained 1 or more years after the last PPSV23 vaccine dose.  Pneumococcal polysaccharide (PPSV23) vaccine. When PCV13 is also indicated, PCV13 should be obtained first. All adults aged 65 years and older should be immunized. An adult younger than age 65 years who has certain medical conditions should be immunized. Any person who resides in a nursing home or long-term care facility should be immunized. An adult smoker should be immunized. People with an immunocompromised condition and certain other conditions should receive both PCV13 and PPSV23 vaccines. People with human immunodeficiency virus (HIV) infection should be immunized as soon as possible after diagnosis. Immunization during chemotherapy or radiation therapy should be avoided. Routine use of PPSV23 vaccine is not recommended for American Indians, [place] Natives, or people younger than 65 years unless there are medical conditions that require  PPSV23 vaccine. When   indicated, people who have unknown immunization and have no record of immunization should receive PPSV23 vaccine. One-time revaccination 5 years after the first dose of PPSV23 is recommended for people aged 19-64 years who have chronic kidney failure, nephrotic syndrome, asplenia, or immunocompromised conditions. People who received 1-2 doses of PPSV23 before age 65 years should receive another dose of PPSV23 vaccine at age 65 years or later if at least 5 years have passed since the previous dose. Doses of PPSV23 are not needed for people immunized with PPSV23 at or after age 65 years.  Meningococcal vaccine. Adults with asplenia or persistent complement component deficiencies should receive 2 doses of quadrivalent meningococcal conjugate (MenACWY-D) vaccine. The doses should be obtained at least 2 months apart. Microbiologists working with certain meningococcal bacteria, military recruits, people at risk during an outbreak, and people who travel to or live in countries with a high rate of meningitis should be immunized. A first-year college student up through age 21 years who is living in a residence hall should receive a dose if she did not receive a dose on or after her 16th birthday. Adults who have certain high-risk conditions should receive one or more doses of vaccine.  Hepatitis A vaccine. Adults who wish to be protected from this disease, have certain high-risk conditions, work with hepatitis A-infected animals, work in hepatitis A research labs, or travel to or work in countries with a high rate of hepatitis A should be immunized. Adults who were previously unvaccinated and who anticipate close contact with an international adoptee during the first 60 days after arrival in the [place] from a country with a high rate of hepatitis A should be immunized.  Hepatitis B vaccine. Adults who wish to be protected from this disease, have certain high-risk conditions, may be exposed  to blood or other infectious body fluids, are household contacts or sex partners of hepatitis B positive people, are clients or workers in certain care facilities, or travel to or work in countries with a high rate of hepatitis B should be immunized.  Haemophilus influenzae type b (Hib) vaccine. A previously unvaccinated person with asplenia or sickle cell disease or having a scheduled splenectomy should receive 1 dose of Hib vaccine. Regardless of previous immunization, a recipient of a hematopoietic stem cell transplant should receive a 3-dose series 6-12 months after her successful transplant. Hib vaccine is not recommended for adults with HIV infection. Preventive Services / Frequency Ages 19 to 39 years  Blood pressure check.** / Every 1 to 2 years.  Lipid and cholesterol check.** / Every 5 years beginning at age 20.  Clinical breast exam.** / Every 3 years for women in their 20s and 30s.  BRCA-related cancer risk assessment.** / For women who have family members with a BRCA-related cancer (breast, ovarian, tubal, or peritoneal cancers).  Pap test.** / Every 2 years from ages 21 through 29. Every 3 years starting at age 30 through age 65 or 70 with a history of 3 consecutive normal Pap tests.  HPV screening.** / Every 3 years from ages 30 through ages 65 to 70 with a history of 3 consecutive normal Pap tests.  Hepatitis C blood test.** / For any individual with known risks for hepatitis C.  Skin self-exam. / Monthly.  Influenza vaccine. / Every year.  Tetanus, diphtheria, and acellular pertussis (Tdap, Td) vaccine.** / Consult your health care provider. Pregnant women should receive 1 dose of Tdap vaccine during each pregnancy. 1 dose of Td every 10   years.  Varicella vaccine.** / Consult your health care provider. Pregnant females who do not have evidence of immunity should receive the first dose after pregnancy.  HPV vaccine. / 3 doses over 6 months, if 72 and younger. The vaccine  is not recommended for use in pregnant females. However, pregnancy testing is not needed before receiving a dose.  Measles, mumps, rubella (MMR) vaccine.** / You need at least 1 dose of MMR if you were born in 1957 or later. You may also need a 2nd dose. For females of childbearing age, rubella immunity should be determined. If there is no evidence of immunity, females who are not pregnant should be vaccinated. If there is no evidence of immunity, females who are pregnant should delay immunization until after pregnancy.  Pneumococcal 13-valent conjugate (PCV13) vaccine.** / Consult your health care provider.  Pneumococcal polysaccharide (PPSV23) vaccine.** / 1 to 2 doses if you smoke cigarettes or if you have certain conditions.  Meningococcal vaccine.** / 1 dose if you are age 17 to 41 years and a Market researcher living in a residence hall, or have one of several medical conditions, you need to get vaccinated against meningococcal disease. You may also need additional booster doses.  Hepatitis A vaccine.** / Consult your health care provider.  Hepatitis B vaccine.** / Consult your health care provider.  Haemophilus influenzae type b (Hib) vaccine.** / Consult your health care provider. Ages 25 to 19 years  Blood pressure check.** / Every 1 to 2 years.  Lipid and cholesterol check.** / Every 5 years beginning at age 66 years.  Lung cancer screening. / Every year if you are aged 61-80 years and have a 30-pack-year history of smoking and currently smoke or have quit within the past 15 years. Yearly screening is stopped once you have quit smoking for at least 15 years or develop a health problem that would prevent you from having lung cancer treatment.  Clinical breast exam.** / Every year after age 63 years.  BRCA-related cancer risk assessment.** / For women who have family members with a BRCA-related cancer (breast, ovarian, tubal, or peritoneal cancers).  Mammogram.** / Every  year beginning at age 46 years and continuing for as long as you are in good health. Consult with your health care provider.  Pap test.** / Every 3 years starting at age 12 years through age 36 or 38 years with a history of 3 consecutive normal Pap tests.  HPV screening.** / Every 3 years from ages 31 years through ages 75 to 45 years with a history of 3 consecutive normal Pap tests.  Fecal occult blood test (FOBT) of stool. / Every year beginning at age 68 years and continuing until age 20 years. You may not need to do this test if you get a colonoscopy every 10 years.  Flexible sigmoidoscopy or colonoscopy.** / Every 5 years for a flexible sigmoidoscopy or every 10 years for a colonoscopy beginning at age 6 years and continuing until age 50 years.  Hepatitis C blood test.** / For all people born from [birth year] through 1963 and any individual with known risks for hepatitis C.  Skin self-exam. / Monthly.  Influenza vaccine. / Every year.  Tetanus, diphtheria, and acellular pertussis (Tdap/Td) vaccine.** / Consult your health care provider. Pregnant women should receive 1 dose of Tdap vaccine during each pregnancy. 1 dose of Td every 10 years.  Varicella vaccine.** / Consult your health care provider. Pregnant females who do not have evidence of immunity should  receive the first dose after pregnancy.  Zoster vaccine.** / 1 dose for adults aged 60 years or older.  Measles, mumps, rubella (MMR) vaccine.** / You need at least 1 dose of MMR if you were born in 1957 or later. You may also need a 2nd dose. For females of childbearing age, rubella immunity should be determined. If there is no evidence of immunity, females who are not pregnant should be vaccinated. If there is no evidence of immunity, females who are pregnant should delay immunization until after pregnancy.  Pneumococcal 13-valent conjugate (PCV13) vaccine.** / Consult your health care provider.  Pneumococcal polysaccharide (PPSV23)  vaccine.** / 1 to 2 doses if you smoke cigarettes or if you have certain conditions.  Meningococcal vaccine.** / Consult your health care provider.  Hepatitis A vaccine.** / Consult your health care provider.  Hepatitis B vaccine.** / Consult your health care provider.  Haemophilus influenzae type b (Hib) vaccine.** / Consult your health care provider. Ages 65 years and over  Blood pressure check.** / Every 1 to 2 years.  Lipid and cholesterol check.** / Every 5 years beginning at age 20 years.  Lung cancer screening. / Every year if you are aged 55-80 years and have a 30-pack-year history of smoking and currently smoke or have quit within the past 15 years. Yearly screening is stopped once you have quit smoking for at least 15 years or develop a health problem that would prevent you from having lung cancer treatment.  Clinical breast exam.** / Every year after age 40 years.  BRCA-related cancer risk assessment.** / For women who have family members with a BRCA-related cancer (breast, ovarian, tubal, or peritoneal cancers).  Mammogram.** / Every year beginning at age 40 years and continuing for as long as you are in good health. Consult with your health care provider.  Pap test.** / Every 3 years starting at age 30 years through age 65 or 70 years with 3 consecutive normal Pap tests. Testing can be stopped between 65 and 70 years with 3 consecutive normal Pap tests and no abnormal Pap or HPV tests in the past 10 years.  HPV screening.** / Every 3 years from ages 30 years through ages 65 or 70 years with a history of 3 consecutive normal Pap tests. Testing can be stopped between 65 and 70 years with 3 consecutive normal Pap tests and no abnormal Pap or HPV tests in the past 10 years.  Fecal occult blood test (FOBT) of stool. / Every year beginning at age 50 years and continuing until age 75 years. You may not need to do this test if you get a colonoscopy every 10 years.  Flexible  sigmoidoscopy or colonoscopy.** / Every 5 years for a flexible sigmoidoscopy or every 10 years for a colonoscopy beginning at age 50 years and continuing until age 75 years.  Hepatitis C blood test.** / For all people born from 1943 through 1965 and any individual with known risks for hepatitis C.  Osteoporosis screening.** / A one-time screening for women ages 65 years and over and women at risk for fractures or osteoporosis.  Skin self-exam. / Monthly.  Influenza vaccine. / Every year.  Tetanus, diphtheria, and acellular pertussis (Tdap/Td) vaccine.** / 1 dose of Td every 10 years.  Varicella vaccine.** / Consult your health care provider.  Zoster vaccine.** / 1 dose for adults aged 60 years or older.  Pneumococcal 13-valent conjugate (PCV13) vaccine.** / Consult your health care provider.  Pneumococcal polysaccharide (PPSV23) vaccine.** /   1 dose for all adults aged 25 years and older.  Meningococcal vaccine.** / Consult your health care provider.  Hepatitis A vaccine.** / Consult your health care provider.  Hepatitis B vaccine.** / Consult your health care provider.  Haemophilus influenzae type b (Hib) vaccine.** / Consult your health care provider. ** Family history and personal history of risk and conditions may change your health care provider's recommendations. Document Released: 03/09/2001 Document Revised: 05/28/2013 Document Reviewed: 06/08/2010 [facility] Patient Information 2015 [place], [place]. This information is not intended to replace advice given to you by your health care provider. Make sure you discuss any questions you have with your health care provider.

## 2014-10-07 NOTE — Assessment & Plan Note (Signed)
On  Bystolic, Cartia, Altace, Xarelto

## 2014-10-07 NOTE — Assessment & Plan Note (Signed)
On  Bystolic, Cartia, Altace ?

## 2014-10-07 NOTE — Progress Notes (Signed)
Subjective:   Selena Taylor is a 79 y.o. female who presents for Medicare Annual (Subsequent) preventive examination.  Review of Systems:  Cardiac Risk Factors include: advanced age (>29men, >57 women);sedentary lifestyle   HRA assessment completed during visit;  Patient is here for Annual Wellness Assessment The Patient was informed that this wellness visit is to identify risk and educate on how to reduce risk for increase disease through lifestyle changes.   ROS deferred to CPE exam with physician Hx Atrial Fib; fup by Sherryl Manges and under medical treatment with anti-coagulation Essential HTN;   BMI: 22 Diet; Very good; salad every day; Eat breakfast; cereal; yogurt Eggs x 1 q weel  Exercise; she is with her spouse but when the weather changes she wants to start walking 2 miles every day  SAFETY/ one level  Safety reviewed for the home; including removal of clutter; clear paths through the home, eliminating clutter, railing as needed; bathroom safety; community safety; smoke detectors and firearms safety as well as sun protection;  Driving accidents no and seatbelt YES Sun protection/  Stressors;   Medication review/ New meds/ doing ok with dose increase  Fall assessment no  Mobilization and Functional losses in the last year. No changes   Urinary or fecal incontinence reviewed; no   Lifeline: http://www.lifelinesys.com/content/home; 561-548-3241 [phone]   Counseling: Colonoscopy; aged out; only had this one time and refuses now due to blood thinner;  EKG 05/14/2014 Dexa scan; 03/25/14/ -2.9 LS and L femoral neck -2.9/ Dr. Posey Rea will discuss Mammogram 03/25/2014 Hearing:  4000hz  left and righ Ophthalmology exam; completed x 6 months; no glaucoma  Immunizations Due  Flu / defer to Dr. Posey Rea for high dose vs reg    Objective:     Vitals: BP 120/70 mmHg  Pulse 81  Ht 5\' 2"  (1.575 m)  Wt 121 lb 8 oz (55.112 kg)  BMI 22.22 kg/m2  SpO2  98%  Tobacco History  Smoking status  . Never Smoker   Smokeless tobacco  . Never Used     Counseling given: Yes   Past Medical History  Diagnosis Date  . TIA (transient ischemic attack)   . PAF (paroxysmal atrial fibrillation)     has been on Flecainide in the past. Stopped 02/10/11; Remains on coumadin anticoagulation; intol to Rythmol and failed DCCV; noted to be in AFlutter 5/13;  Echo 01/2011:  EF 55-60%, mild MR, mild LAE.  Myoview 6/12:  No ischemia, EF 74%  . HTN (hypertension)   . Endometriosis   . Osteopenia   . Insomnia   . Chronic anticoagulation     on coumadin  . Anxiety   . Pericardial effusion   . Shortness of breath   . Pericardial effusion 02/23/2012  . CHF (congestive heart failure)    Past Surgical History  Procedure Laterality Date  . Polypectomy    . Total hysterectomy and bilateral salpingoopherectomy    . Appendectomy    . Skin cancer removal    . Cardioversion  01/14/2011    Procedure: CARDIOVERSION;  Surgeon: Duke Salvia, MD;  Location: [facility] OR;  Service: Cardiovascular;  Laterality: N/A;  To be completed in Neuro OR 33 time slot 0830 12/20  . Cardioversion  05/27/2011    Procedure: CARDIOVERSION;  Surgeon: Duke Salvia, MD;  Location: [facility] OR;  Service: Cardiovascular;  Laterality: N/A;  . Pericardial window  02/25/2012    Procedure: PERICARDIAL WINDOW;  Surgeon: Delight Ovens, MD;  Location: [facility] OR;  Service: Thoracic;  Laterality: N/A;  . Tee without cardioversion  02/25/2012    Procedure: TRANSESOPHAGEAL ECHOCARDIOGRAM (TEE);  Surgeon: Delight Ovens, MD;  Location[facility] OR;  Service: Thoracic;  Laterality: N/A;   Family History  Problem Relation Age of Onset  . Heart disease      Father age 3  . Cancer Brother     myeloma   History  Sexual Activity  . Sexual Activity: Not Currently    Outpatient Encounter Prescriptions as of 10/07/2014  Medication Sig  . Cholecalciferol 1000 UNITS TBDP Take 1,000 Units by mouth once.  .  diltiazem (CARTIA XT) 240 MG 24 hr capsule Take 1 capsule (240 mg total) by mouth daily.  . Grape Seed 100 MG CAPS Take 1 capsule by mouth daily.   . hydrocortisone 2.5 % cream 2 (two) times daily as needed.  . nebivolol (BYSTOLIC) 5 MG tablet Take 1 tablet (5 mg total) by mouth daily.  . Omega-3 Fatty Acids (FISH OIL) 1000 MG CAPS Take 1,000 mg by mouth daily.   . ramipril (ALTACE) 10 MG capsule TAKE ONE CAPSULE BY MOUTH EVERY DAY  . rivaroxaban (XARELTO) 20 MG TABS tablet Take 1 tablet (20 mg total) by mouth daily.  Marland Kitchen zolpidem (AMBIEN) 10 MG tablet Take 1 tablet (10 mg total) by mouth at bedtime as needed.   No facility-administered encounter medications on file as of 10/07/2014.    Activities of Daily Living In your present state of health, do you have any difficulty performing the following activities: 10/07/2014  Hearing? N  Vision? N  Difficulty concentrating or making decisions? N  Walking or climbing stairs? N  Dressing or bathing? N  Doing errands, shopping? N  Preparing Food and eating ? N  Using the Toilet? N  In the past six months, have you accidently leaked urine? N  Do you have problems with loss of bowel control? N  Managing your Medications? N  Managing your Finances? N  Housekeeping or managing your Housekeeping? N    Patient Care Team: Tresa Garter, MD as PCP - General (Internal Medicine)    Assessment:   Assessment   Today patient counseled on age appropriate routine health concerns for screening and prevention, each reviewed and up to date or declined. Immunizations reviewed/ to take flu shot;  Labs deferred for CPE or medical fup by MD.  Risk factors for depression reviewed and negative. Hearing function and visual acuity are intact. ADLs screened and addressed as needed. Functional ability and level of safety reviewed and appropriate. Educated on memory loss and AD8 score 0; still beats her son at chess; Education, counseling and referrals performed  based on assessed risks today. Patient provided with a copy of personalized plan for preventive services and due dates  FALL PREVENTION Educated on prevention falls; Exercise, toning and strengthening; Balance exercises / educated on incorporating weights into her exercise; hopes to start walking 2 miles when the weather changes; Will attempt to do low weights a couple of times a week in lieu of osteo and A1c trending up.  HEPATITIS SCREEN deferred ; no risk identified  TOBACCO neg /ETOH  2 glasses of wine at hs No other DRUG use was negative  Barriers to successful management: None noted  Stress; (1-5) Risk and reduction techniques reviewed Stress is high; discussed breathing exercises and yoga; as well as taking respite from care giving activities     Exercise Activities and Dietary recommendations Type of exercise: walking, Time (Minutes):  45, Frequency (Times/Week): 2 (adding some weights to plan), Weekly Exercise (Minutes/Week): 90  Goals    . Exercise 150 minutes per week (moderate activity)     Wants to start walking again; 2 miles when the weather is cooler; Will start some form of weight bearing exercise to assist with osteo and blood sugar control       Fall Risk Fall Risk  10/07/2014  Falls in the past year? No   Depression Screen PHQ 2/9 Scores 10/07/2014  PHQ - 2 Score 0     Cognitive Testing No flowsheet data found.  Immunization History  Administered Date(s) Administered  . Influenza Split 11/18/2011  . Influenza Whole 12/08/2007, 11/18/2008, 11/17/2009  . Influenza, High Dose Seasonal PF 11/08/2012, 10/29/2013  . Pneumococcal Conjugate-13 08/14/2014  . Pneumococcal Polysaccharide-23 11/27/2002  . Td 08/11/2009  . Zoster 02/18/2009   Screening Tests Health Maintenance  Topic Date Due  . COLONOSCOPY  11/08/1987  . INFLUENZA VACCINE  08/26/2014  . TETANUS/TDAP  08/12/2019  . DEXA SCAN  Completed  . ZOSTAVAX  Completed  . PNA vac Low Risk Adult   Completed      Plan:   To take the flu shot  To start walking with low weights and reps a couple of times per week Educated on Osteo treatments; Educated on pre diabetes with A1c 6.5 as weight and strength training will assist this as well.   During the course of the visit the patient was educated and counseled about the following appropriate screening and preventive services:   Vaccines to include Pneumoccal, Influenza, Hepatitis B, Td, Zostavax, HCV/  Electrocardiogram  Cardiovascular Disease  Colorectal cancer screening  Bone density screening to discuss with dr. Posey Rea;  Diabetes screening  Glaucoma screening  Mammography/PAP completed  Nutrition counseling   Patient Instructions (the written plan) was given to the patient.   Montine Circle, RN  10/07/2014

## 2014-10-07 NOTE — Assessment & Plan Note (Signed)
Labs

## 2014-10-07 NOTE — Progress Notes (Signed)
Subjective:  Patient ID: Selena Taylor, female    DOB: 03-Apr-1935  Age: 79 y.o. MRN: [id number]  CC: Medicare Wellness   HPI Selena Taylor presents for well exam. C/o fatigue, insomnia, A fib  Outpatient Prescriptions Prior to Visit  Medication Sig Dispense Refill  . diltiazem (CARTIA XT) 240 MG 24 hr capsule Take 1 capsule (240 mg total) by mouth daily. 30 capsule 11  . Grape Seed 100 MG CAPS Take 1 capsule by mouth daily.     . nebivolol (BYSTOLIC) 5 MG tablet Take 1 tablet (5 mg total) by mouth daily. 30 tablet 11  . Omega-3 Fatty Acids (FISH OIL) 1000 MG CAPS Take 1,000 mg by mouth daily.     . ramipril (ALTACE) 10 MG capsule TAKE ONE CAPSULE BY MOUTH EVERY DAY 30 capsule 12  . rivaroxaban (XARELTO) 20 MG TABS tablet Take 1 tablet (20 mg total) by mouth daily. 30 tablet 11  . zolpidem (AMBIEN) 10 MG tablet Take 1 tablet (10 mg total) by mouth at bedtime as needed. 30 tablet 2   No facility-administered medications prior to visit.    ROS Review of Systems  Constitutional: Positive for fatigue. Negative for chills, activity change, appetite change and unexpected weight change.  HENT: Negative for congestion, mouth sores and sinus pressure.   Eyes: Negative for visual disturbance.  Respiratory: Negative for cough and chest tightness.   Gastrointestinal: Negative for nausea and abdominal pain.  Genitourinary: Negative for frequency, difficulty urinating and vaginal pain.  Musculoskeletal: Negative for back pain and gait problem.  Skin: Negative for pallor and rash.  Neurological: Negative for dizziness, tremors, weakness, numbness and headaches.  Psychiatric/Behavioral: Positive for sleep disturbance. Negative for suicidal ideas and confusion.    Objective:  BP 120/70 mmHg  Pulse 81  Ht 5\' 2"  (1.575 m)  Wt 121 lb 8 oz (55.112 kg)  BMI 22.22 kg/m2  SpO2 98%  BP Readings from Last 3 Encounters:  10/07/14 120/70  05/14/14 140/74  01/23/14 130/84    Wt Readings from  Last 3 Encounters:  10/07/14 121 lb 8 oz (55.112 kg)  05/14/14 120 lb (54.432 kg)  01/23/14 121 lb (54.885 kg)    Physical Exam  Constitutional: She appears well-developed. No distress.  HENT:  Head: Normocephalic.  Right Ear: External ear normal.  Left Ear: External ear normal.  Nose: Nose normal.  Mouth/Throat: Oropharynx is clear and moist.  Eyes: Conjunctivae are normal. Pupils are equal, round, and reactive to light. Right eye exhibits no discharge. Left eye exhibits no discharge.  Neck: Normal range of motion. Neck supple. No JVD present. No tracheal deviation present. No thyromegaly present.  Cardiovascular: Normal rate, regular rhythm and normal heart sounds.   Pulmonary/Chest: No stridor. No respiratory distress. She has no wheezes.  Abdominal: Soft. Bowel sounds are normal. She exhibits no distension and no mass. There is no tenderness. There is no rebound and no guarding.  Musculoskeletal: She exhibits no edema or tenderness.  Lymphadenopathy:    She has no cervical adenopathy.  Neurological: She displays normal reflexes. No cranial nerve deficit. She exhibits normal muscle tone. Coordination normal.  Skin: No rash noted. No erythema.  Psychiatric: She has a normal mood and affect. Her behavior is normal. Judgment and thought content normal.  AKs  Lab Results  Component Value Date   WBC 6.9 07/26/2013   HGB 13.5 07/26/2013   HCT 40.2 07/26/2013   PLT 209.0 07/26/2013   GLUCOSE 90 01/28/2014  CHOL 125 02/06/2012   TRIG 40 02/06/2012   HDL 91 02/06/2012   LDLCALC 26 02/06/2012   ALT 27 07/26/2013   AST 32 07/26/2013   NA 141 01/28/2014   K 3.7 01/28/2014   CL 109 01/28/2014   CREATININE 0.8 01/28/2014   BUN 22 01/28/2014   CO2 24 01/28/2014   TSH 0.87 04/16/2013   INR 2.03* 02/29/2012   HGBA1C 6.5 01/28/2014    Dg Bone Density  03/25/2014   CLINICAL DATA:  Postmenopausal.  EXAM: DUAL X-RAY ABSORPTIOMETRY (DXA) FOR BONE MINERAL DENSITY  FINDINGS: AP LUMBAR  SPINE L1 through L4  Bone Mineral Density (BMD):  0.796 g/cm2  Young Adult T-Score:  -2.3  Z-Score:  0.2  LEFT FEMUR NECK  Bone Mineral Density (BMD):  0.530 g/cm2  Young Adult T-Score: -2.9  Z-Score:  -0.7  ASSESSMENT: Patient's diagnostic category is OSTEOPOROSIS by WHO Criteria.  FRACTURE RISK: Increased  FRAX: World Health Organization FRAX assessment of absolute fracture risk is not calculated for this patient because the patient has osteoporosis.  COMPARISON: None.  Effective therapies are available in the form of bisphosphonates, selective estrogen receptor modulators, biologic agents, and hormone replacement therapy (for women). All patients should ensure an adequate intake of dietary calcium (1200 mg daily) and vitamin D (800 IU daily) unless contraindicated.  All treatment decisions require clinical judgment and consideration of individual patient factors, including patient preferences, co-morbidities, previous drug use, risk factors not captured in the FRAX model (e.g., frailty, falls, vitamin D deficiency, increased bone turnover, interval significant decline in bone density) and possible under- or over-estimation of fracture risk by FRAX.  The National Osteoporosis Foundation recommends that FDA-approved medical therapies be considered in postmenopausal women and men age 12 or older with a:  1. Hip or vertebral (clinical or morphometric) fracture.  2. T-score of -2.5 or lower at the spine or hip.  3. Ten-year fracture probability by FRAX of 3% or greater for hip fracture or 20% or greater for major osteoporotic fracture.  People with diagnosed cases of osteoporosis or at high risk for fracture should have regular bone mineral density tests. For patients eligible for Medicare, routine testing is allowed once every 2 years. The testing frequency can be increased to one year for patients who have rapidly progressing disease, those who are receiving or discontinuing medical therapy to restore bone mass, or  have additional risk factors.  World Science writer [facility]) Criteria:  Normal: T-scores from +1.0 to -1.0  Low Bone Mass (Osteopenia): T-scores between -1.0 and -2.5  Osteoporosis: T-scores -2.5 and below  Comparison to Reference Population:  T-score is the key measure used in the diagnosis of osteoporosis and relative risk determination for fracture. It provides a value for bone mass relative to the mean bone mass of a young adult reference population expressed in terms of standard deviation (SD).  Z-score is the age-matched score showing the patient's values compared to a population matched for age, sex, and race. This is also expressed in terms of standard deviation. The patient may have values that compare favorably to the age-matched values and still be at increased risk for fracture.   Electronically Signed   By: Britta Mccreedy M.D.   On: 03/25/2014 14:42   Mm Screening Breast Tomo Bilateral  03/25/2014   CLINICAL DATA:  Screening.  EXAM: DIGITAL SCREENING BILATERAL MAMMOGRAM WITH 3D TOMO WITH CAD  COMPARISON:  Previous exam(s).  ACR Breast Density Category c: The breast tissue is heterogeneously dense, which  may obscure small masses.  FINDINGS: There are no findings suspicious for malignancy. Images were processed with CAD.  IMPRESSION: No mammographic evidence of malignancy. A result letter of this screening mammogram will be mailed directly to the patient.  RECOMMENDATION: Screening mammogram in one year. (Code:[id number]-B-01Y)  BI-RADS CATEGORY  1: Negative.   Electronically Signed   By: Britta Mccreedy M.D.   On: 03/25/2014 16:06    Assessment & Plan:   Selena Taylor was seen today for medicare wellness.  Diagnoses and all orders for this visit:  Persistent atrial fibrillation  Essential hypertension  Transient cerebral ischemia, unspecified transient cerebral ischemia type  Well adult exam  Other orders -     zolpidem (AMBIEN) 10 MG tablet; Take 1 tablet (10 mg total) by mouth at bedtime as  needed.  I am having Selena Taylor maintain her Fish Oil, Grape Seed, ramipril, diltiazem, rivaroxaban, nebivolol, Cholecalciferol, hydrocortisone, and zolpidem.  Meds ordered this encounter  Medications  . Cholecalciferol 1000 UNITS TBDP    Sig: Take 1,000 Units by mouth once.  . hydrocortisone 2.5 % cream    Sig: 2 (two) times daily as needed.    Refill:  0  . zolpidem (AMBIEN) 10 MG tablet    Sig: Take 1 tablet (10 mg total) by mouth at bedtime as needed.    Dispense:  90 tablet    Refill:  1     Follow-up: No Follow-up on file.  Sonda Primes, MD

## 2014-10-07 NOTE — Assessment & Plan Note (Signed)

## 2014-10-15 ENCOUNTER — Other Ambulatory Visit (INDEPENDENT_AMBULATORY_CARE_PROVIDER_SITE_OTHER): Payer: Medicare Other | Admitting: *Deleted

## 2014-10-15 DIAGNOSIS — Z0000 Encounter for general adult medical examination without abnormal findings: Secondary | ICD-10-CM | POA: Diagnosis not present

## 2014-10-15 DIAGNOSIS — I4819 Other persistent atrial fibrillation: Secondary | ICD-10-CM

## 2014-10-15 DIAGNOSIS — G459 Transient cerebral ischemic attack, unspecified: Secondary | ICD-10-CM | POA: Diagnosis not present

## 2014-10-15 DIAGNOSIS — I481 Persistent atrial fibrillation: Secondary | ICD-10-CM | POA: Diagnosis not present

## 2014-10-15 DIAGNOSIS — I10 Essential (primary) hypertension: Secondary | ICD-10-CM | POA: Diagnosis not present

## 2014-10-15 DIAGNOSIS — R202 Paresthesia of skin: Secondary | ICD-10-CM

## 2014-10-15 LAB — TSH: TSH: 0.66 u[IU]/mL (ref 0.35–4.50)

## 2014-10-15 LAB — LIPID PANEL
CHOLESTEROL: 163 mg/dL (ref 0–200)
HDL: 79.8 mg/dL (ref >39.00)
LDL Cholesterol: 65 mg/dL (ref 0–99)
NonHDL: 83.65
Total CHOL/HDL Ratio: 2
Triglycerides: 93 mg/dL (ref 0.0–149.0)
VLDL: 18.6 mg/dL (ref 0.0–40.0)

## 2014-10-15 LAB — HEPATIC FUNCTION PANEL
ALBUMIN: 3.9 g/dL (ref 3.5–5.2)
ALT: 17 U/L (ref 0–35)
AST: 30 U/L (ref 0–37)
Alkaline Phosphatase: 3 U/L — ABNORMAL LOW (ref 39–117)
Bilirubin, Direct: 0.1 mg/dL (ref 0.0–0.3)
Total Bilirubin: 0.4 mg/dL (ref 0.2–1.2)
Total Protein: 7.2 g/dL (ref 6.0–8.3)

## 2014-10-15 LAB — CBC WITH DIFFERENTIAL/PLATELET
BASOS PCT: 0.5 % (ref 0.0–3.0)
Basophils Absolute: 0 10*3/uL (ref 0.0–0.1)
EOS ABS: 0.2 10*3/uL (ref 0.0–0.7)
Eosinophils Relative: 2.7 % (ref 0.0–5.0)
HEMATOCRIT: 40 % (ref 36.0–46.0)
Hemoglobin: 13.2 g/dL (ref 12.0–15.0)
LYMPHS ABS: 2.5 10*3/uL (ref 0.7–4.0)
LYMPHS PCT: 32.8 % (ref 12.0–46.0)
MCHC: 33 g/dL (ref 30.0–36.0)
MCV: 99.9 fl (ref 78.0–100.0)
Monocytes Absolute: 0.5 10*3/uL (ref 0.1–1.0)
Monocytes Relative: 6.5 % (ref 3.0–12.0)
NEUTROS ABS: 4.4 10*3/uL (ref 1.4–7.7)
Neutrophils Relative %: 57.5 % (ref 43.0–77.0)
PLATELETS: 199 10*3/uL (ref 150.0–400.0)
RBC: 4 Mil/uL (ref 3.87–5.11)
RDW: 13.8 % (ref 11.5–15.5)
WBC: 7.6 10*3/uL (ref 4.0–10.5)

## 2014-10-15 LAB — VITAMIN B12: VITAMIN B 12: 1258 pg/mL — AB (ref 211–911)

## 2014-11-19 ENCOUNTER — Telehealth: Payer: Self-pay | Admitting: Internal Medicine

## 2014-11-19 ENCOUNTER — Telehealth: Payer: Self-pay | Admitting: *Deleted

## 2014-11-19 NOTE — Telephone Encounter (Signed)
Patient calling the office for samples of medication:   1.  What medication and dosage are you requesting samples for? xarelto and bystolic  2.  Are you currently out of this medication? yes  3. Are you requesting samples to get you through until you receive your prescription? Yes in [place]

## 2014-11-19 NOTE — Telephone Encounter (Signed)
called and informed pt that we placed Bystolic 5 mg up front for her but that we did not have xarelto.

## 2014-12-02 ENCOUNTER — Telehealth: Payer: Self-pay | Admitting: *Deleted

## 2014-12-02 NOTE — Telephone Encounter (Signed)
Left msg on triage stating her husband just recently past & she is having a difficulty time with it wanting to see can md rx some alprazolam.../lmb

## 2014-12-03 MED ORDER — ALPRAZOLAM 0.25 MG PO TABS: 0.2500 mg | ORAL_TABLET | Freq: Two times a day (BID) | ORAL | Status: DC | PRN

## 2014-12-03 NOTE — Telephone Encounter (Signed)
I'm sorry for Mr Alycia RossettiKoch passing. OK Alprazolam Thx

## 2014-12-03 NOTE — Telephone Encounter (Signed)
Notified pt with md response. Inform rx has been call into [facility] had to leave on pharmacy vm...Raechel Chute/lmb

## 2014-12-12 ENCOUNTER — Telehealth: Payer: Self-pay | Admitting: Internal Medicine

## 2014-12-12 ENCOUNTER — Other Ambulatory Visit: Payer: Self-pay | Admitting: Internal Medicine

## 2014-12-12 MED ORDER — RAMIPRIL 10 MG PO CAPS: 10.0000 mg | ORAL_CAPSULE | Freq: Every day | ORAL | Status: DC

## 2014-12-12 NOTE — Telephone Encounter (Signed)
Pt called and requested a refill on her Ramipril. I sent a refill to pt's pharmacy requested. Confirmation received.

## 2015-03-05 ENCOUNTER — Telehealth: Payer: Self-pay | Admitting: Internal Medicine

## 2015-03-05 DIAGNOSIS — Z85828 Personal history of other malignant neoplasm of skin: Secondary | ICD-10-CM | POA: Diagnosis not present

## 2015-03-05 DIAGNOSIS — D485 Neoplasm of uncertain behavior of skin: Secondary | ICD-10-CM | POA: Diagnosis not present

## 2015-03-05 DIAGNOSIS — L089 Local infection of the skin and subcutaneous tissue, unspecified: Secondary | ICD-10-CM | POA: Diagnosis not present

## 2015-03-05 NOTE — Telephone Encounter (Signed)
Pt calling to see if she can take extra strength Tylenol? pls advise

## 2015-03-05 NOTE — Telephone Encounter (Signed)
Informed patient it is ok to take 1000 mg every 6 hours as needed.  She had a "lesion" removed from her leg today and may take later this evening as needed.

## 2015-03-26 ENCOUNTER — Telehealth: Payer: Self-pay | Admitting: Internal Medicine

## 2015-03-26 NOTE — Telephone Encounter (Signed)
New message     [employer] will need prior authorization in order to continue to pay for bystolic .  Their fax number is [phone] and their number is [phone].  Pt has approx 30days of medication.

## 2015-03-28 ENCOUNTER — Telehealth: Payer: Self-pay

## 2015-03-28 NOTE — Telephone Encounter (Signed)
[facility] Bystolic 5mg  [facility]

## 2015-03-28 NOTE — Telephone Encounter (Signed)
[facility]  [facility]

## 2015-03-31 ENCOUNTER — Telehealth: Payer: Self-pay | Admitting: Internal Medicine

## 2015-03-31 NOTE — Telephone Encounter (Signed)
OK. Noted 

## 2015-03-31 NOTE — Telephone Encounter (Signed)
New message    [facility]   For authorization for pt medications was bystolic 5mg  was approved by 03-28-15   End date 03-27-2016 a letter will be sent to the office for Dr.Klein

## 2015-04-01 ENCOUNTER — Telehealth: Payer: Self-pay

## 2015-04-01 NOTE — Telephone Encounter (Signed)
Bystolic 5 mg approved by [employer].

## 2015-04-16 ENCOUNTER — Telehealth: Payer: Self-pay

## 2015-04-16 NOTE — Telephone Encounter (Signed)
Recd faxed rx refill request for ambien 10mg  tab from [facility] ([address])----please advise, thanks

## 2015-04-17 ENCOUNTER — Other Ambulatory Visit: Payer: Self-pay

## 2015-04-17 MED ORDER — ZOLPIDEM TARTRATE 10 MG PO TABS: 10.0000 mg | ORAL_TABLET | Freq: Every evening | ORAL | Status: DC | PRN

## 2015-04-17 NOTE — Telephone Encounter (Signed)
Faxed to [facility]

## 2015-04-17 NOTE — Telephone Encounter (Signed)
rx printed and waiting for plot to sign 

## 2015-04-17 NOTE — Telephone Encounter (Signed)
OK to fill this prescription with additional refills x1. sch ov Thank you!  

## 2015-04-17 NOTE — Telephone Encounter (Signed)
rx printed, waiting for plot to sign---patient advised that she needs to schedule appt soon---patient will call back to schedule appt

## 2015-05-05 DIAGNOSIS — H1012 Acute atopic conjunctivitis, left eye: Secondary | ICD-10-CM | POA: Diagnosis not present

## 2015-05-12 ENCOUNTER — Other Ambulatory Visit: Payer: Self-pay

## 2015-05-12 DIAGNOSIS — Z1231 Encounter for screening mammogram for malignant neoplasm of breast: Secondary | ICD-10-CM

## 2015-05-19 ENCOUNTER — Other Ambulatory Visit: Payer: Self-pay

## 2015-05-19 MED ORDER — DILTIAZEM HCL ER COATED BEADS 240 MG PO CP24: 240.0000 mg | ORAL_CAPSULE | Freq: Every day | ORAL | Status: DC

## 2015-05-19 MED ORDER — NEBIVOLOL HCL 5 MG PO TABS: 5.0000 mg | ORAL_TABLET | Freq: Every day | ORAL | Status: DC

## 2015-05-19 NOTE — Telephone Encounter (Signed)
Duke SalviaSteven C Klein, MD at 05/14/2014 2:44 PM  nebivolol (BYSTOLIC) 2.5 MG tablet Take 1 tablet (2.5 mg total) by mouth daily Patient Instructions     Medication Instructions:  Your physician has recommended you make the following change in your medication:  Increase Bystolic to 5 mg by mouth daily. Increase Xarelto to 20 mg by mouth daily

## 2015-05-20 NOTE — Telephone Encounter (Signed)
OK to refill.  She's seeing Dr. Graciela HusbandsKlein in June.  Thanks!

## 2015-05-21 ENCOUNTER — Other Ambulatory Visit: Payer: Self-pay | Admitting: *Deleted

## 2015-05-21 MED ORDER — RIVAROXABAN 20 MG PO TABS: 20.0000 mg | ORAL_TABLET | Freq: Every day | ORAL | Status: DC

## 2015-06-10 ENCOUNTER — Other Ambulatory Visit: Payer: Self-pay | Admitting: Internal Medicine

## 2015-06-16 ENCOUNTER — Ambulatory Visit: Payer: Medicare Other | Admitting: Internal Medicine

## 2015-06-18 ENCOUNTER — Ambulatory Visit
Admission: RE | Admit: 2015-06-18 | Discharge: 2015-06-18 | Disposition: A | Discharge status: Home / Self Care | Payer: Medicare Other | Source: Ambulatory Visit

## 2015-06-18 DIAGNOSIS — Z1231 Encounter for screening mammogram for malignant neoplasm of breast: Secondary | ICD-10-CM | POA: Diagnosis not present

## 2015-06-18 LAB — HM MAMMOGRAPHY

## 2015-06-27 ENCOUNTER — Ambulatory Visit (INDEPENDENT_AMBULATORY_CARE_PROVIDER_SITE_OTHER): Payer: Medicare Other | Admitting: Internal Medicine

## 2015-06-27 ENCOUNTER — Encounter: Payer: Self-pay | Admitting: Internal Medicine

## 2015-06-27 ENCOUNTER — Telehealth: Payer: Self-pay

## 2015-06-27 VITALS — BP 118/80 | HR 74 | Wt 120.0 lb

## 2015-06-27 DIAGNOSIS — I481 Persistent atrial fibrillation: Secondary | ICD-10-CM

## 2015-06-27 DIAGNOSIS — R251 Tremor, unspecified: Secondary | ICD-10-CM

## 2015-06-27 DIAGNOSIS — R7309 Other abnormal glucose: Secondary | ICD-10-CM | POA: Diagnosis not present

## 2015-06-27 DIAGNOSIS — I10 Essential (primary) hypertension: Secondary | ICD-10-CM

## 2015-06-27 DIAGNOSIS — I4819 Other persistent atrial fibrillation: Secondary | ICD-10-CM

## 2015-06-27 MED ORDER — ZOLPIDEM TARTRATE 10 MG PO TABS: 10.0000 mg | ORAL_TABLET | Freq: Every evening | ORAL | Status: DC | PRN

## 2015-06-27 MED ORDER — ALPRAZOLAM 0.25 MG PO TABS: 0.2500 mg | ORAL_TABLET | Freq: Two times a day (BID) | ORAL | Status: DC | PRN

## 2015-06-27 NOTE — Progress Notes (Signed)
Pre visit review using our clinic review tool, if applicable. No additional management support is needed unless otherwise documented below in the visit note. 

## 2015-06-27 NOTE — Telephone Encounter (Signed)
PA initiated via CoverMyMeds key [id number]

## 2015-06-27 NOTE — Assessment & Plan Note (Signed)
labs

## 2015-06-27 NOTE — Assessment & Plan Note (Signed)
Chronic  Xanax prn  Potential benefits of a long term benzodiazepines  use as well as potential risks  and complications were explained to the patient and were aknowledged. 

## 2015-06-27 NOTE — Progress Notes (Signed)
Subjective:  Patient ID: Selena Taylor, female    DOB: 1935-08-16  Age: 80 y.o. MRN: [id number]  CC: No chief complaint on file.   HPI Selena SaltJoyce H Piechowski presents for tremor, HTN, PAF f/u  Outpatient Prescriptions Prior to Visit  Medication Sig Dispense Refill  . ALPRAZolam (XANAX) 0.25 MG tablet Take 1 tablet (0.25 mg total) by mouth 2 (two) times daily as needed for anxiety. 30 tablet 0  . Cholecalciferol 1000 UNITS TBDP Take 1,000 Units by mouth once.    . diltiazem (CARTIA XT) 240 MG 24 hr capsule Take 1 capsule (240 mg total) by mouth daily. 30 capsule 3  . Grape Seed 100 MG CAPS Take 1 capsule by mouth daily.     . Omega-3 Fatty Acids (FISH OIL) 1000 MG CAPS Take 1,000 mg by mouth daily.     . ramipril (ALTACE) 10 MG capsule TAKE 1 CAPSULE BY MOUTH DAILY 30 capsule 0  . rivaroxaban (XARELTO) 20 MG TABS tablet Take 1 tablet (20 mg total) by mouth daily. 30 tablet 1  . zolpidem (AMBIEN) 10 MG tablet Take 1 tablet (10 mg total) by mouth at bedtime as needed. 90 tablet 1  . hydrocortisone 2.5 % cream 2 (two) times daily as needed. Reported on 06/27/2015  0  . nebivolol (BYSTOLIC) 5 MG tablet Take 1 tablet (5 mg total) by mouth daily. (Patient not taking: Reported on 06/27/2015) 30 tablet 1   No facility-administered medications prior to visit.    ROS Review of Systems  Constitutional: Negative for chills, activity change, appetite change, fatigue and unexpected weight change.  HENT: Negative for congestion, mouth sores and sinus pressure.   Eyes: Negative for visual disturbance.  Respiratory: Negative for cough and chest tightness.   Gastrointestinal: Negative for nausea and abdominal pain.  Genitourinary: Negative for frequency, difficulty urinating and vaginal pain.  Musculoskeletal: Negative for back pain and gait problem.  Skin: Negative for pallor and rash.  Neurological: Negative for dizziness, tremors, weakness, numbness and headaches.  Psychiatric/Behavioral: Negative for  suicidal ideas, confusion and sleep disturbance.    Objective:  BP 118/80 mmHg  Pulse 74  Wt 120 lb (54.432 kg)  SpO2 98%  BP Readings from Last 3 Encounters:  06/27/15 118/80  10/07/14 120/70  05/14/14 140/74    Wt Readings from Last 3 Encounters:  06/27/15 120 lb (54.432 kg)  10/07/14 121 lb 8 oz (55.112 kg)  05/14/14 120 lb (54.432 kg)    Physical Exam  Constitutional: She appears well-developed. No distress.  HENT:  Head: Normocephalic.  Right Ear: External ear normal.  Left Ear: External ear normal.  Nose: Nose normal.  Mouth/Throat: Oropharynx is clear and moist.  Eyes: Conjunctivae are normal. Pupils are equal, round, and reactive to light. Right eye exhibits no discharge. Left eye exhibits no discharge.  Neck: Normal range of motion. Neck supple. No JVD present. No tracheal deviation present. No thyromegaly present.  Cardiovascular: Normal heart sounds.   Pulmonary/Chest: No stridor. No respiratory distress. She has no wheezes.  Abdominal: Soft. Bowel sounds are normal. She exhibits no distension and no mass. There is no tenderness. There is no rebound and no guarding.  Musculoskeletal: She exhibits no edema or tenderness.  Lymphadenopathy:    She has no cervical adenopathy.  Neurological: She displays normal reflexes. No cranial nerve deficit. She exhibits normal muscle tone. Coordination normal.  Skin: No rash noted. No erythema.  Psychiatric: She has a normal mood and affect. Her behavior is normal.  Judgment and thought content normal.  irreg irreg RR  Lab Results  Component Value Date   WBC 7.6 10/15/2014   HGB 13.2 10/15/2014   HCT 40.0 10/15/2014   PLT 199.0 10/15/2014   GLUCOSE 90 01/28/2014   CHOL 163 10/15/2014   TRIG 93.0 10/15/2014   HDL 79.80 10/15/2014   LDLCALC 65 10/15/2014   ALT 17 10/15/2014   AST 30 10/15/2014   NA 141 01/28/2014   K 3.7 01/28/2014   CL 109 01/28/2014   CREATININE 0.8 01/28/2014   BUN 22 01/28/2014   CO2 24  01/28/2014   TSH 0.66 10/15/2014   INR 2.03* 02/29/2012   HGBA1C 6.5 01/28/2014    Mm Screening Breast Tomo Bilateral  06/18/2015  CLINICAL DATA:  Screening. EXAM: 2D DIGITAL SCREENING BILATERAL MAMMOGRAM WITH CAD AND ADJUNCT TOMO COMPARISON:  Previous exam(s). ACR Breast Density Category c: The breast tissue is heterogeneously dense, which may obscure small masses. FINDINGS: There are no findings suspicious for malignancy. Images were processed with CAD. IMPRESSION: No mammographic evidence of malignancy. A result letter of this screening mammogram will be mailed directly to the patient. RECOMMENDATION: Screening mammogram in one year. (Code:SM-B-01Y) BI-RADS CATEGORY  1: Negative. Electronically Signed   By: Rolla Plate M.D.   On: 06/18/2015 14:02    Assessment & Plan:   Diagnoses and all orders for this visit:  Essential hypertension  Persistent atrial fibrillation (HCC)  Other orders -     ALPRAZolam (XANAX) 0.25 MG tablet; Take 1 tablet (0.25 mg total) by mouth 2 (two) times daily as needed for anxiety. -     zolpidem (AMBIEN) 10 MG tablet; Take 1 tablet (10 mg total) by mouth at bedtime as needed.   I am having Ms. Alycia Rossetti maintain her Fish Oil, Grape Seed, Cholecalciferol, hydrocortisone, ALPRAZolam, zolpidem, nebivolol, diltiazem, rivaroxaban, and ramipril.  No orders of the defined types were placed in this encounter.     Follow-up: No Follow-up on file.  Sonda Primes, MD

## 2015-06-27 NOTE — Assessment & Plan Note (Signed)
On  Bystolic, Cartia, Altace ?

## 2015-06-27 NOTE — Assessment & Plan Note (Signed)
On Xarelto, Bystolic, [place]

## 2015-06-30 NOTE — Telephone Encounter (Signed)
Zolpidem key [id number]

## 2015-06-30 NOTE — Telephone Encounter (Signed)
Zolpidem APPR[facility]ED [facility]

## 2015-07-01 NOTE — Telephone Encounter (Signed)
Alprazolam DENIED, plan does not cover this medication or others in its class.  Per pharmacist, medication is $9 out of pocket with discount card that they will provide pt. Pt is agreeable to pay out of pocket for medication

## 2015-07-01 NOTE — Telephone Encounter (Signed)
OK. Thx

## 2015-07-02 DIAGNOSIS — H1012 Acute atopic conjunctivitis, left eye: Secondary | ICD-10-CM | POA: Diagnosis not present

## 2015-07-11 ENCOUNTER — Other Ambulatory Visit: Payer: Self-pay | Admitting: Internal Medicine

## 2015-07-15 ENCOUNTER — Encounter: Payer: Self-pay | Admitting: Internal Medicine

## 2015-07-15 ENCOUNTER — Ambulatory Visit (INDEPENDENT_AMBULATORY_CARE_PROVIDER_SITE_OTHER): Payer: Medicare Other | Admitting: Internal Medicine

## 2015-07-15 ENCOUNTER — Other Ambulatory Visit: Payer: Self-pay | Admitting: Internal Medicine

## 2015-07-15 VITALS — BP 128/84 | HR 85 | Ht 62.0 in | Wt 119.8 lb

## 2015-07-15 DIAGNOSIS — I482 Chronic atrial fibrillation, unspecified: Secondary | ICD-10-CM

## 2015-07-15 NOTE — Progress Notes (Signed)
Patient Care Team: Tresa GarterAleksei V Plotnikov, MD as PCP - General (Internal Medicine) Duke SalviaSteven C Arney Mayabb, MD as Consulting Physician (Cardiology)   HPI  Selena Taylor is a 80 y.o. female Seen in followup for atrial fibrillation and pericarditis. Because of persistent symptoms she had undergone a pericardial window.  She takes Rivaroxaban    There has been no significant chest pain or shortness of breath  Her husband has died currently. [place]. Life is much much better.  No bleeding issues    When she was seen last year her heart rate was fast. We started her on low-dose bisoprolol. This was associated with improvement in her resting rate to the 90s.     Past Medical History  Diagnosis Date  . TIA (transient ischemic attack)   . PAF (paroxysmal atrial fibrillation) (HCC)     has been on Flecainide in the past. Stopped 02/10/11; Remains on coumadin anticoagulation; intol to Rythmol and failed DCCV; noted to be in AFlutter 5/13;  Echo 01/2011:  EF 55-60%, mild MR, mild LAE.  Myoview 6/12:  No ischemia, EF 74%  . HTN (hypertension)   . Endometriosis   . Osteopenia   . Insomnia   . Chronic anticoagulation     on coumadin  . Anxiety   . Pericardial effusion   . Shortness of breath   . Pericardial effusion 02/23/2012  . CHF (congestive heart failure) [facility](HCC)     Past Surgical History  Procedure Laterality Date  . Polypectomy    . Total hysterectomy and bilateral salpingoopherectomy    . Appendectomy    . Skin cancer removal    . Cardioversion  01/14/2011    Procedure: CARDIOVERSION;  Surgeon: Duke SalviaSteven C Shiniqua Groseclose, MD;  [facility];  Service: Cardiovascular;  Laterality: N/A;  To be completed in Neuro OR 33 time slot 0830 12/20  . Cardioversion  05/27/2011    Procedure: CARDIOVERSION;  Surgeon: Duke SalviaSteven C Yuna Pizzolato, MD;  [facility];  Service: Cardiovascular;  Laterality: N/A;  . Pericardial window  02/25/2012    Procedure: PERICARDIAL WINDOW;  Surgeon: Delight OvensEdward B  Gerhardt, MD;  [facility];  Service: Thoracic;  Laterality: N/A;  . Tee without cardioversion  02/25/2012    Procedure: TRANSESOPHAGEAL ECHOCARDIOGRAM (TEE);  Surgeon: Delight OvensEdward B Gerhardt, MD;  [facility];  Service: Thoracic;  Laterality: N/A;    Current Outpatient Prescriptions  Medication Sig Dispense Refill  . ALPRAZolam (XANAX) 0.25 MG tablet Take 1 tablet (0.25 mg total) by mouth 2 (two) times daily as needed for anxiety. 60 tablet 3  . Cholecalciferol 1000 UNITS TBDP Take 1,000 Units by mouth once.    . diltiazem (CARTIA XT) 240 MG 24 hr capsule Take 1 capsule (240 mg total) by mouth daily. 30 capsule 3  . Grape Seed 100 MG CAPS Take 1 capsule by mouth daily.     . nebivolol (BYSTOLIC) 5 MG tablet Take 1 tablet (5 mg total) by mouth daily. 30 tablet 1  . ramipril (ALTACE) 10 MG capsule TAKE 1 CAPSULE BY MOUTH DAILY 30 capsule 0  . rivaroxaban (XARELTO) 20 MG TABS tablet Take 1 tablet (20 mg total) by mouth daily. 30 tablet 1  . zolpidem (AMBIEN) 10 MG tablet Take 1 tablet (10 mg total) by mouth at bedtime as needed. 90 tablet 1  . [DISCONTINUED] diltiazem (DILACOR XR) 240 MG 24 hr capsule Take 1 capsule (240 mg total) by mouth daily. 30 capsule 11  No current facility-administered medications for this visit.    Allergies  Allergen Reactions  . Other Other (See Comments)    Novocaine.  REACTION:  Rapid heart rate  . Procaine Hcl     Rapid heart rate    Review of Systems negative except from HPI and PMH  Physical Exam BP 128/84 mmHg  Pulse 85  Ht  (1.575 m)  Wt 119 lb 12.8 oz (54.341 kg)  BMI 21.91 kg/m2  SpO2 98% Well developed and well nourished in no acute distress HENT normal E scleral and icterus clear Neck Supple JVP  ; carotids brisk and full Clear to ausculation Irregular irregular  rate and rhythm, no murmurs gallops or rub Soft with active bowel sounds No clubbing cyanosis  Edema Alert and oriented, grossly normal motor and sensory  function Skin Warm and Dry   ECG was ordered today demonstrates atrial fibrillation at a rate of 118 Intervals-/07/31 so we are going give her a work 3 and also her there is a 50 primary 22nd so 22nd  Assessment and  Plan  Atrial fibrillation with a Controlled ventricular response   Renal insufficiency   Hypertension  Atrial fibrillation is now permanent-heart rates seem well-controlled and she has no functional limitations.  We will check a metabolic profile today to assess renal function for dosing and recheck her CBC

## 2015-07-15 NOTE — Patient Instructions (Signed)
Medication Instructions: - Your physician recommends that you continue on your current medications as directed. Please refer to the Current Medication list given to you today.  Labwork: - Your physician recommends that you have lab work today: BMP/ CBC  Procedures/Testing: - none  Follow-Up: - Your physician wants you to follow-up in: 1 year with Dr. Graciela HusbandsKlein. You will receive a reminder letter in the mail two months in advance. If you don't receive a letter, please call our office to schedule the follow-up appointment.  Any Additional Special Instructions Will Be Listed Below (If Applicable).     If you need a refill on your cardiac medications before your next appointment, please call your pharmacy.

## 2015-07-16 ENCOUNTER — Other Ambulatory Visit (INDEPENDENT_AMBULATORY_CARE_PROVIDER_SITE_OTHER): Payer: Medicare Other | Admitting: *Deleted

## 2015-07-16 DIAGNOSIS — I482 Chronic atrial fibrillation, unspecified: Secondary | ICD-10-CM

## 2015-07-16 LAB — CBC WITH DIFFERENTIAL/PLATELET
Basophils Absolute: 49 cells/uL (ref 0–200)
Basophils Relative: 1 %
EOS PCT: 4 %
Eosinophils Absolute: 196 cells/uL (ref 15–500)
HCT: 41.6 % (ref 35.0–45.0)
Hemoglobin: 13.9 g/dL (ref 11.7–15.5)
LYMPHS ABS: 2058 {cells}/uL (ref 850–3900)
LYMPHS PCT: 42 %
MCH: 32.7 pg (ref 27.0–33.0)
MCHC: 33.4 g/dL (ref 32.0–36.0)
MCV: 97.9 fL (ref 80.0–100.0)
MONOS PCT: 10 %
MPV: 11.5 fL (ref 7.5–12.5)
Monocytes Absolute: 490 cells/uL (ref 200–950)
NEUTROS PCT: 43 %
Neutro Abs: 2107 cells/uL (ref 1500–7800)
PLATELETS: 215 10*3/uL (ref 140–400)
RBC: 4.25 MIL/uL (ref 3.80–5.10)
RDW: 13.9 % (ref 11.0–15.0)
WBC: 4.9 10*3/uL (ref 3.8–10.8)

## 2015-07-16 LAB — BASIC METABOLIC PANEL
BUN: 14 mg/dL (ref 7–25)
CALCIUM: 8.8 mg/dL (ref 8.6–10.4)
CO2: 23 mmol/L (ref 20–31)
CREATININE: 0.89 mg/dL (ref 0.60–0.93)
Chloride: 105 mmol/L (ref 98–110)
GLUCOSE: 90 mg/dL (ref 65–99)
Potassium: 4.6 mmol/L (ref 3.5–5.3)
SODIUM: 138 mmol/L (ref 135–146)

## 2015-07-16 NOTE — Addendum Note (Signed)
Addended by: Tonita PhoenixBOWDEN, ROBIN K on: 07/16/2015 08:28 AM   Modules accepted: Orders

## 2015-07-20 ENCOUNTER — Other Ambulatory Visit: Payer: Self-pay | Admitting: Internal Medicine

## 2015-07-21 ENCOUNTER — Other Ambulatory Visit: Payer: Self-pay | Admitting: *Deleted

## 2015-07-21 MED ORDER — RAMIPRIL 10 MG PO CAPS: 10.0000 mg | ORAL_CAPSULE | Freq: Every day | ORAL | Status: DC

## 2015-07-21 MED ORDER — RIVAROXABAN 20 MG PO TABS: 20.0000 mg | ORAL_TABLET | Freq: Every day | ORAL | Status: DC

## 2015-07-21 MED ORDER — DILTIAZEM HCL ER COATED BEADS 240 MG PO CP24: 240.0000 mg | ORAL_CAPSULE | Freq: Every day | ORAL | Status: DC

## 2015-07-21 MED ORDER — NEBIVOLOL HCL 5 MG PO TABS: 5.0000 mg | ORAL_TABLET | Freq: Every day | ORAL | Status: DC

## 2015-07-21 NOTE — Telephone Encounter (Signed)
Patient called and was upset that her refills were not sent in at her office visit last week. She stated that the rooming cma indicated to her that she would take care of this, but when she went to the pharmacy she was informed that the rx's were not sent in. She stated that this should have been done as these are life threatening drugs.

## 2015-07-31 DIAGNOSIS — Z85828 Personal history of other malignant neoplasm of skin: Secondary | ICD-10-CM | POA: Diagnosis not present

## 2015-07-31 DIAGNOSIS — L570 Actinic keratosis: Secondary | ICD-10-CM | POA: Diagnosis not present

## 2015-07-31 DIAGNOSIS — L821 Other seborrheic keratosis: Secondary | ICD-10-CM | POA: Diagnosis not present

## 2015-07-31 DIAGNOSIS — L820 Inflamed seborrheic keratosis: Secondary | ICD-10-CM | POA: Diagnosis not present

## 2015-08-10 ENCOUNTER — Other Ambulatory Visit: Payer: Self-pay | Admitting: Internal Medicine

## 2015-08-11 NOTE — Telephone Encounter (Signed)
ramipril (ALTACE) 10 MG capsule  Medication   Date: 07/21/2015  [facility]  Ordering/Authorizing: Duke SalviaSteven C Klein, MD      Order Providers    Prescribing Provider Encounter Provider   Duke SalviaSteven C Klein, MD Valrie HartMindy L Isley, CMA    Medication Detail      Disp Refills Start End     ramipril (ALTACE) 10 MG capsule 90 capsule 3 07/21/2015     Sig - Route: Take 1 capsule (10 mg total) by mouth daily. - Oral    E-Prescribing Status: Receipt confirmed by pharmacy (07/21/2015 11:21 AM EDT)     Pharmacy    [phone] - [place], [place] - [address] AT [facility] [address]

## 2015-10-15 DIAGNOSIS — D485 Neoplasm of uncertain behavior of skin: Secondary | ICD-10-CM | POA: Diagnosis not present

## 2015-10-15 DIAGNOSIS — Z85828 Personal history of other malignant neoplasm of skin: Secondary | ICD-10-CM | POA: Diagnosis not present

## 2015-10-15 DIAGNOSIS — L570 Actinic keratosis: Secondary | ICD-10-CM | POA: Diagnosis not present

## 2015-10-15 DIAGNOSIS — L821 Other seborrheic keratosis: Secondary | ICD-10-CM | POA: Diagnosis not present

## 2015-10-15 DIAGNOSIS — L820 Inflamed seborrheic keratosis: Secondary | ICD-10-CM | POA: Diagnosis not present

## 2015-10-27 ENCOUNTER — Telehealth: Payer: Self-pay | Admitting: Internal Medicine

## 2015-10-27 ENCOUNTER — Ambulatory Visit (INDEPENDENT_AMBULATORY_CARE_PROVIDER_SITE_OTHER): Payer: Medicare Other

## 2015-10-27 DIAGNOSIS — Z23 Encounter for immunization: Secondary | ICD-10-CM | POA: Diagnosis not present

## 2015-10-27 NOTE — Telephone Encounter (Signed)
I spoke with the pt and she complains of a sharp pain in a muscle that goes from one side of her back to the other side.  This has been ongoing for the past 4-5 months and does not occur everyday. The pain does change location. The pt questions if her back pain is related to Xarelto.  The pt said Tylenol does not work for her any longer.  I advised the pt that she can take an occasional Ibuprofen to see if this helps her back pain but NSAIDS should not be used on a long term basis.    The pt also asked about taking DHEA and based on the information I could find this is not recommended with anticoagulant medications because it can increase risk of bruising and bleeding.   I will forward this information to Dr Graciela HusbandsKlein and Sherri RadHeather McGhee RN to review and follow-up with the pt.

## 2015-10-27 NOTE — Telephone Encounter (Signed)
[name] is calling because she is experiencing  back pain and she is on Xarelto and read its one of the side effects and wants to talk about that . Also she is wanting to know can she take an over the counter  supplement called Dhea with her heart medications that she is currently on .  Please call

## 2015-10-28 ENCOUNTER — Ambulatory Visit: Payer: Medicare Other

## 2015-10-28 NOTE — Telephone Encounter (Signed)
Follow up  Pt voiced instead of DHEA it's "DHA" and wondering if it's okay per MD-Klein and MD-Klein's nurse.  Triage nurse advised to route to MD-Klein's nurse.  Please follow up

## 2015-10-29 NOTE — Telephone Encounter (Signed)
Omega 3 can thin the blood and increase risk of bruising and bleeding since she also takes Xarelto. We do have many patients on blood thinners + fish oil and just recommend that they monitor for any change in bruising or bleeding. It is not an absolute contraindication to use both.  In terms of dosing, fish oil is used to treat high triglycerides. Patient does not have high triglycerides. Not sure why she wants to start taking it, but 1,000mg  daily is fine. Does not need a higher dose since triglycerides have been normal.

## 2015-10-29 NOTE — Telephone Encounter (Signed)
I called and spoke with the patient and she is aware that per Dr. Graciela HusbandsKlein, she is ok to take DHA. She is also been made aware to watch for increased bruising/ bleeding that may be associated with its use. She verbalizes understanding.

## 2015-10-29 NOTE — Telephone Encounter (Signed)
If DHA is omega 3 she can take it.

## 2015-10-29 NOTE — Telephone Encounter (Signed)
DHA is an Omega 3 from what I can see, there is also a comment about this having some anti-inflammatory effects.  Will forward to pharmacy to review since the patient is on xarelto. If there is no real contraindication for this, then what is the recommended dose?

## 2015-11-07 DIAGNOSIS — L570 Actinic keratosis: Secondary | ICD-10-CM | POA: Diagnosis not present

## 2015-11-07 DIAGNOSIS — L820 Inflamed seborrheic keratosis: Secondary | ICD-10-CM | POA: Diagnosis not present

## 2015-11-07 DIAGNOSIS — Z85828 Personal history of other malignant neoplasm of skin: Secondary | ICD-10-CM | POA: Diagnosis not present

## 2015-11-19 ENCOUNTER — Telehealth: Payer: Self-pay | Admitting: Internal Medicine

## 2015-11-19 NOTE — Telephone Encounter (Deleted)
error 

## 2015-11-21 NOTE — Telephone Encounter (Signed)
Follow Up:    Pt calling again,she says she wants to have an antibotic before her dental work. She says she would feel better having one with all the dental work she needs.

## 2015-11-21 NOTE — Telephone Encounter (Signed)
I left a message for the patient to call. 

## 2015-11-21 NOTE — Telephone Encounter (Signed)
I spoke with the patient.  She has a history of pericarditis and is requiring quite a bit of dental work (going under the gum) on 11/13. She is requesting pre-med prior to. I advised her I will review with Dr. Graciela HusbandsKlein when he is back in the office next week. No drug allergies to antibiotics noted.  Pharmacy is [facility] [address]. I will call her back next week after MD reviews- she is agreeable.

## 2015-11-23 NOTE — Telephone Encounter (Signed)
No indication for antibiotics

## 2015-11-27 NOTE — Telephone Encounter (Signed)
The patient is aware of Dr. Klein's recommendations. 

## 2015-12-10 ENCOUNTER — Telehealth: Payer: Self-pay | Admitting: Internal Medicine

## 2015-12-10 NOTE — Telephone Encounter (Signed)
Spoke with pt and she states that she spoke with a Pharmacist and they said she was ok to take the CoQ10.  Pt has some friends that take this as well and have greatly benefited from it.  Pt would like to start taking if Dr. Graciela HusbandsKlein feels like it would be ok.  Will route to Dr. Graciela HusbandsKlein for review and advisement.

## 2015-12-10 NOTE — Telephone Encounter (Signed)
New Message:    Pt wants to know if she can take a supplement from over the counter,called COQ10?

## 2015-12-12 NOTE — Telephone Encounter (Signed)
Sure

## 2015-12-15 NOTE — Telephone Encounter (Signed)
The patient is aware that she may take CoQ10 per Dr. Graciela HusbandsKlein.

## 2015-12-26 DIAGNOSIS — H25013 Cortical age-related cataract, bilateral: Secondary | ICD-10-CM | POA: Diagnosis not present

## 2016-01-02 DIAGNOSIS — L720 Epidermal cyst: Secondary | ICD-10-CM | POA: Diagnosis not present

## 2016-01-02 DIAGNOSIS — D485 Neoplasm of uncertain behavior of skin: Secondary | ICD-10-CM | POA: Diagnosis not present

## 2016-01-02 DIAGNOSIS — L570 Actinic keratosis: Secondary | ICD-10-CM | POA: Diagnosis not present

## 2016-01-02 DIAGNOSIS — Z85828 Personal history of other malignant neoplasm of skin: Secondary | ICD-10-CM | POA: Diagnosis not present

## 2016-01-02 DIAGNOSIS — L814 Other melanin hyperpigmentation: Secondary | ICD-10-CM | POA: Diagnosis not present

## 2016-01-16 ENCOUNTER — Telehealth: Payer: Self-pay | Admitting: *Deleted

## 2016-01-16 MED ORDER — ZOLPIDEM TARTRATE 10 MG PO TABS: 10.0000 mg | ORAL_TABLET | Freq: Every evening | ORAL | 1 refills | Status: DC | PRN

## 2016-01-16 NOTE — Telephone Encounter (Signed)
Rec'd fax pt requesting refill on her Zolpidem Tartrate. Last filled 10/14/15. Is this ok...Raechel Chute/lmb

## 2016-01-16 NOTE — Telephone Encounter (Signed)
[facility] had to leave on pharmacy vm. Updated epic...Raechel Chute/lmb

## 2016-01-16 NOTE — Telephone Encounter (Signed)
OK to fill this/these prescription(s) with additional refills x1 Needs to have an OV Thank you!

## 2016-03-01 DIAGNOSIS — H25012 Cortical age-related cataract, left eye: Secondary | ICD-10-CM | POA: Diagnosis not present

## 2016-04-15 ENCOUNTER — Telehealth: Payer: Self-pay | Admitting: *Deleted

## 2016-04-15 NOTE — Telephone Encounter (Signed)
Rec'd fax pt requesting refill on her alprazolam 0.25 mg. Last filled 07/01/15...Raechel Chute/lmb

## 2016-04-15 NOTE — Telephone Encounter (Signed)
OK to fill this/these prescription(s) with additional refills x0 Needs to have an OV [facility] well w/Jill too pls Thank you!

## 2016-04-16 MED ORDER — ALPRAZOLAM 0.25 MG PO TABS: 0.2500 mg | ORAL_TABLET | Freq: Two times a day (BID) | ORAL | 0 refills | Status: DC | PRN

## 2016-04-16 NOTE — Telephone Encounter (Signed)
[facility] had to leave on pharmacy vm.../lmb 

## 2016-04-27 ENCOUNTER — Telehealth: Payer: Self-pay | Admitting: *Deleted

## 2016-04-27 NOTE — Telephone Encounter (Signed)
Patient called and stated that she received a letter in the mail informing her that her insurance will no longer pay for the bystolic. [facility] uses [facility]

## 2016-04-27 NOTE — Telephone Encounter (Signed)
Patient can be reached at [phone]. Thanks, MI

## 2016-04-29 ENCOUNTER — Other Ambulatory Visit: Payer: Self-pay

## 2016-04-29 MED ORDER — NEBIVOLOL HCL 5 MG PO TABS: 5.0000 mg | ORAL_TABLET | Freq: Every day | ORAL | 3 refills | Status: DC

## 2016-04-29 NOTE — Telephone Encounter (Signed)
**Note De-Identified Garet Hooton Obfuscation** I called the pts pharmacy [facility]) and spoke with Strum who states that PA is not due until pt needs next refill. He states that he will send PA form around the 20th of this month.  I have left a detailed message on the pts VM stating that it is to soon to request a PA for her Bystolic.

## 2016-05-04 ENCOUNTER — Other Ambulatory Visit: Payer: Self-pay | Admitting: Internal Medicine

## 2016-05-04 DIAGNOSIS — Z1231 Encounter for screening mammogram for malignant neoplasm of breast: Secondary | ICD-10-CM

## 2016-05-06 ENCOUNTER — Telehealth: Payer: Self-pay | Admitting: Internal Medicine

## 2016-05-06 NOTE — Telephone Encounter (Signed)
Patient is requesting Dr. Posey Rea to send an order for routine Dexa to [facility].  States last Dexa was three years ago.

## 2016-05-06 NOTE — Telephone Encounter (Signed)
Routing to dr plotnikov, please advise, thanks 

## 2016-05-07 ENCOUNTER — Encounter: Payer: Self-pay | Admitting: *Deleted

## 2016-05-07 NOTE — Telephone Encounter (Signed)
Called patient to inform that Dr. Posey Rea requests that she make an AWV to discuss health goals and wellness. Explained that a bone density scan could be ordered during the visit. An appointment was scheduled for 05/24/16

## 2016-05-07 NOTE — Telephone Encounter (Signed)
Pls sch [facility] well w/Jill. She will order Thx

## 2016-05-07 NOTE — Telephone Encounter (Signed)
Routing to jill/awc

## 2016-05-12 DIAGNOSIS — L821 Other seborrheic keratosis: Secondary | ICD-10-CM | POA: Diagnosis not present

## 2016-05-12 DIAGNOSIS — Z85828 Personal history of other malignant neoplasm of skin: Secondary | ICD-10-CM | POA: Diagnosis not present

## 2016-05-12 DIAGNOSIS — L814 Other melanin hyperpigmentation: Secondary | ICD-10-CM | POA: Diagnosis not present

## 2016-05-12 DIAGNOSIS — L570 Actinic keratosis: Secondary | ICD-10-CM | POA: Diagnosis not present

## 2016-05-24 ENCOUNTER — Telehealth: Payer: Self-pay | Admitting: *Deleted

## 2016-05-24 ENCOUNTER — Ambulatory Visit (INDEPENDENT_AMBULATORY_CARE_PROVIDER_SITE_OTHER): Payer: Medicare Other | Admitting: *Deleted

## 2016-05-24 VITALS — BP 122/64 | HR 71 | Resp 20 | Ht 62.0 in | Wt 117.0 lb

## 2016-05-24 DIAGNOSIS — Z0000 Encounter for general adult medical examination without abnormal findings: Secondary | ICD-10-CM | POA: Diagnosis not present

## 2016-05-24 DIAGNOSIS — E2839 Other primary ovarian failure: Secondary | ICD-10-CM

## 2016-05-24 NOTE — Patient Instructions (Addendum)
Continue to eat heart healthy diet (full of fruits, vegetables, whole grains, lean protein, water--limit salt, fat, and sugar intake) and increase physical activity as tolerated.  Continue doing brain stimulating activities (puzzles, reading, adult coloring books, staying active) to keep memory sharp.   Selena Taylor , Thank you for taking time to come for your Medicare Wellness Visit. I appreciate your ongoing commitment to your health goals. Please review the following plan we discussed and let me know if I can assist you in the future.   These are the goals we discussed: Goals    . Exercise 150 minutes per week (moderate activity)          Wants to start walking again; 2 miles when the weather is cooler; Will start some form of weight bearing exercise to assist with osteo and blood sugar control     . Maintian current health status          Continue to exercise, eat healthy, enjoying life and church.       This is a list of the screening recommended for you and due dates:  Health Maintenance  Topic Date Due  . Flu Shot  08/25/2016  . Tetanus Vaccine  08/12/2019  . DEXA scan (bone density measurement)  Completed  . Pneumonia vaccines  Completed   Insomnia Insomnia is a sleep disorder that makes it difficult to fall asleep or to stay asleep. Insomnia can cause tiredness (fatigue), low energy, difficulty concentrating, mood swings, and poor performance at work or school. There are three different ways to classify insomnia:  Difficulty falling asleep.  Difficulty staying asleep.  Waking up too early in the morning. Any type of insomnia can be long-term (chronic) or short-term (acute). Both are common. Short-term insomnia usually lasts for three months or less. Chronic insomnia occurs at least three times a week for longer than three months. What are the causes? Insomnia may be caused by another condition, situation, or substance, such as:  Anxiety.  Certain  medicines.  Gastroesophageal reflux disease (GERD) or other gastrointestinal conditions.  Asthma or other breathing conditions.  Restless legs syndrome, sleep apnea, or other sleep disorders.  Chronic pain.  Menopause. This may include hot flashes.  Stroke.  Abuse of alcohol, tobacco, or illegal drugs.  Depression.  Caffeine.  Neurological disorders, such as Alzheimer disease.  An overactive thyroid (hyperthyroidism). The cause of insomnia may not be known. What increases the risk? Risk factors for insomnia include:  Gender. Women are more commonly affected than men.  Age. Insomnia is more common as you get older.  Stress. This may involve your professional or personal life.  Income. Insomnia is more common in people with lower income.  Lack of exercise.  Irregular work schedule or night shifts.  Traveling between different time zones. What are the signs or symptoms? If you have insomnia, trouble falling asleep or trouble staying asleep is the main symptom. This may lead to other symptoms, such as:  Feeling fatigued.  Feeling nervous about going to sleep.  Not feeling rested in the morning.  Having trouble concentrating.  Feeling irritable, anxious, or depressed. How is this treated? Treatment for insomnia depends on the cause. If your insomnia is caused by an underlying condition, treatment will focus on addressing the condition. Treatment may also include:  Medicines to help you sleep.  Counseling or therapy.  Lifestyle adjustments. Follow these instructions at home:  Take medicines only as directed by your health care provider.  Keep regular sleeping  and waking hours. Avoid naps.  Keep a sleep diary to help you and your health care provider figure out what could be causing your insomnia. Include:  When you sleep.  When you wake up during the night.  How well you sleep.  How rested you feel the next day.  Any side effects of medicines you  are taking.  What you eat and drink.  Make your bedroom a comfortable place where it is easy to fall asleep:  Put up shades or special blackout curtains to block light from outside.  Use a white noise machine to block noise.  Keep the temperature cool.  Exercise regularly as directed by your health care provider. Avoid exercising right before bedtime.  Use relaxation techniques to manage stress. Ask your health care provider to suggest some techniques that may work well for you. These may include:  Breathing exercises.  Routines to release muscle tension.  Visualizing peaceful scenes.  Cut back on alcohol, caffeinated beverages, and cigarettes, especially close to bedtime. These can disrupt your sleep.  Do not overeat or eat spicy foods right before bedtime. This can lead to digestive discomfort that can make it hard for you to sleep.  Limit screen use before bedtime. This includes:  Watching TV.  Using your smartphone, tablet, and computer.  Stick to a routine. This can help you fall asleep faster. Try to do a quiet activity, brush your teeth, and go to bed at the same time each night.  Get out of bed if you are still awake after 15 minutes of trying to sleep. Keep the lights down, but try reading or doing a quiet activity. When you feel sleepy, go back to bed.  Make sure that you drive carefully. Avoid driving if you feel very sleepy.  Keep all follow-up appointments as directed by your health care provider. This is important. Contact a health care provider if:  You are tired throughout the day or have trouble in your daily routine due to sleepiness.  You continue to have sleep problems or your sleep problems get worse. Get help right away if:  You have serious thoughts about hurting yourself or someone else. This information is not intended to replace advice given to you by your health care provider. Make sure you discuss any questions you have with your health care  provider. Document Released: 01/09/2000 Document Revised: 06/13/2015 Document Reviewed: 10/12/2013 Elsevier Interactive Patient Education  2017 [employer].

## 2016-05-24 NOTE — Telephone Encounter (Signed)
During AWV patient stated that despite taking the ambien  nightly she will wake up around 0300 and has difficulty falling back asleep. She asked if perhaps ambien cr might be more effective or do you know of another medication that may help. Patient is also concerned about the tremors she experiences and asked if there is another medication that she can take in conjunction with the xanax that may treat the tremors more effectively.

## 2016-05-24 NOTE — Progress Notes (Signed)
Subjective:   Selena Taylor is a 81 y.o. female who presents for Medicare Annual (Subsequent) preventive examination.  Review of Systems:  No ROS.  Medicare Wellness Visit.  Cardiac Risk Factors include: advanced age (>39men, >33 women);hypertension Sleep patterns: feels rested on waking, gets up 2 times nightly to void and sleeps 6 hours nightly.    Patient reports long-term insomnia issues, discussed recommended sleep tips , education was attached to patient's AVS.  Home Safety/Smoke Alarms: Feels safe in home. Smoke alarms in place.    Living environment; residence and Firearm Safety: 2-story house, no firearms. Lives with son, no DME needs Seat Belt Safety/Bike Helmet: Wears seat belt.   Counseling:   Eye Exam- appointment yearly, Dr. Huel [facility]- appointment every 6 months   Female:   Pap- N/A       Mammo- Last 06/18/15,BI-RADS CATEGORY  1: Negative, upcoming appointment scheduled 06/22/16       Dexa scan-  Last 03/25/14, osteoporosis,  referral placed today     CCS- none, patient declined referral due to her age     Objective:     Vitals: BP 122/64   Pulse 71   Resp 20   Ht  (1.575 m)   Wt 117 lb (53.1 kg)   SpO2 98%   BMI 21.40 kg/m   Body mass index is 21.4 kg/m.   Tobacco History  Smoking Status  . Never Smoker  Smokeless Tobacco  . Never Used     Counseling given: Not Answered   Past Medical History:  Diagnosis Date  . Anxiety   . CHF (congestive heart failure) (HCC)   . Chronic anticoagulation    on coumadin  . Endometriosis   . HTN (hypertension)   . Insomnia   . Osteopenia   . PAF (paroxysmal atrial fibrillation) (HCC)    has been on Flecainide in the past. Stopped 02/10/11; Remains on coumadin anticoagulation; intol to Rythmol and failed DCCV; noted to be in AFlutter 5/13;  Echo 01/2011:  EF 55-60%, mild MR, mild LAE.  Myoview 6/12:  No ischemia, EF 74%  . Pericardial effusion   . Pericardial effusion 02/23/2012  . Shortness of breath     . TIA (transient ischemic attack)    Past Surgical History:  Procedure Laterality Date  . APPENDECTOMY    . CARDIOVERSION  01/14/2011   Procedure: CARDIOVERSION;  Surgeon: Duke Salvia, MD;  Location: [facility] OR;  Service: Cardiovascular;  Laterality: N/A;  To be completed in Neuro OR 33 time slot 0830 12/20  . CARDIOVERSION  05/27/2011   Procedure: CARDIOVERSION;  Surgeon: Duke Salvia, MD;  Location: [facility] OR;  Service: Cardiovascular;  Laterality: N/A;  . PERICARDIAL WINDOW  02/25/2012   Procedure: PERICARDIAL WINDOW;  Surgeon: Delight Ovens, MD;  Location: [facility] OR;  Service: Thoracic;  Laterality: N/A;  . POLYPECTOMY    . skin cancer removal    . TEE WITHOUT CARDIOVERSION  02/25/2012   Procedure: TRANSESOPHAGEAL ECHOCARDIOGRAM (TEE);  Surgeon: Delight Ovens, MD;  Location: [facility] OR;  Service: Thoracic;  Laterality: N/A;  . TOTAL HYSTERECTOMY AND BILATERAL SALPINGOOPHERECTOMY     Family History  Problem Relation Age of Onset  . Cancer Brother     myeloma  . Heart disease      Father age 18   History  Sexual Activity  . Sexual activity: Not Currently    Outpatient Encounter Prescriptions as of 05/24/2016  Medication Sig  . diltiazem (CARTIA XT) 240  MG 24 hr capsule Take 1 capsule (240 mg total) by mouth daily.  . Grape Seed 100 MG CAPS Take 1 capsule by mouth daily.   . nebivolol (BYSTOLIC) 5 MG tablet Take 1 tablet (5 mg total) by mouth daily.  . ramipril (ALTACE) 10 MG capsule Take 1 capsule (10 mg total) by mouth daily.  . rivaroxaban (XARELTO) 20 MG TABS tablet Take 1 tablet (20 mg total) by mouth daily.  Marland Kitchen zolpidem (AMBIEN) 10 MG tablet Take 1 tablet (10 mg total) by mouth at bedtime as needed.  . ALPRAZolam (XANAX) 0.25 MG tablet Take 1 tablet (0.25 mg total) by mouth 2 (two) times daily as needed for anxiety. Must see MD for future refills  . [DISCONTINUED] Cholecalciferol 1000 UNITS TBDP Take 1,000 Units by mouth once.   No facility-administered encounter medications on  file as of 05/24/2016.     Activities of Daily Living In your present state of health, do you have any difficulty performing the following activities: 05/24/2016  Hearing? N  Vision? N  Difficulty concentrating or making decisions? N  Walking or climbing stairs? N  Dressing or bathing? N  Doing errands, shopping? N  Preparing Food and eating ? N  Using the Toilet? N  In the past six months, have you accidently leaked urine? N  Do you have problems with loss of bowel control? N  Managing your Medications? N  Managing your Finances? N  Housekeeping or managing your Housekeeping? N  Some recent data might be hidden    Patient Care Team: Tresa Garter, MD as PCP - General (Internal Medicine) Duke Salvia, MD as Consulting Physician (Cardiology) Venancio Poisson, MD as Consulting Physician (Dermatology)    Assessment:    Physical assessment deferred to PCP.  Exercise Activities and Dietary recommendations Current Exercise Habits: Structured exercise class, Type of exercise: walking (Tia chi), Time (Minutes): 45, Frequency (Times/Week): 3, Weekly Exercise (Minutes/Week): 135, Intensity: Mild, Exercise limited by: None identified  Diet (meal preparation, eat out, water intake, caffeinated beverages, dairy products, fruits and vegetables): in general, a "healthy" diet  , well balanced, high fat/ cholesterol, low salt, mainly vegetarian diet with fish for protein, drinks 3-4 glasses of water daily, limits caffeine and sugar.   Encouraged patient to increase daily water intake.   Goals    . Exercise 150 minutes per week (moderate activity)          Wants to start walking again; 2 miles when the weather is cooler; Will start some form of weight bearing exercise to assist with osteo and blood sugar control     . Maintian current health status          Continue to exercise, eat healthy, enjoying life and church.      Fall Risk Fall Risk  05/24/2016 10/07/2014  Falls in the past  year? No No   Depression Screen PHQ 2/9 Scores 05/24/2016 10/07/2014  PHQ - 2 Score 0 0     Cognitive Function        Immunization History  Administered Date(s) Administered  . Influenza Split 11/18/2011  . Influenza Whole 12/08/2007, 11/18/2008, 11/17/2009  . Influenza, High Dose Seasonal PF 11/08/2012, 10/29/2013, 10/07/2014, 10/27/2015  . Pneumococcal Conjugate-13 08/14/2014  . Pneumococcal Polysaccharide-23 11/27/2002  . Td 08/11/2009  . Zoster 02/18/2009   Screening Tests Health Maintenance  Topic Date Due  . INFLUENZA VACCINE  08/25/2016  . TETANUS/TDAP  08/12/2019  . DEXA SCAN  Completed  . PNA vac  Low Risk Adult  Completed      Plan:    Continue to eat heart healthy diet (full of fruits, vegetables, whole grains, lean protein, water--limit salt, fat, and sugar intake) and increase physical activity as tolerated.  Continue doing brain stimulating activities (puzzles, reading, adult coloring books, staying active) to keep memory sharp.   I have personally reviewed and noted the following in the patient's chart:   . Medical and social history . Use of alcohol, tobacco or illicit drugs  . Current medications and supplements . Functional ability and status . Nutritional status . Physical activity . Advanced directives . List of other physicians . Hospitalizations, surgeries, and ER visits in previous 12 months . Vitals . Screenings to include cognitive, depression, and falls . Referrals and appointments  In addition, I have reviewed and discussed with patient certain preventive protocols, quality metrics, and best practice recommendations. A written personalized care plan for preventive services as well as general preventive health recommendations were provided to patient.    Wanda Plump, RN  05/24/2016

## 2016-05-24 NOTE — Telephone Encounter (Signed)
I'll be happy to see her to discuss Thx

## 2016-05-24 NOTE — Progress Notes (Signed)
Pre visit review using our clinic review tool, if applicable. No additional management support is needed unless otherwise documented below in the visit note. 

## 2016-05-25 ENCOUNTER — Telehealth: Payer: Self-pay | Admitting: Internal Medicine

## 2016-05-25 NOTE — Telephone Encounter (Signed)
Called patient to inform patient that a bone density scan was scheduled at 1030 06/22/16 right before her mammogram. She was instructed to stop take vitamin D 48 hours before the scan. Scheduled a follow-up appointment with PCP 06/04/16 for patient to discuss insomnia and tremors issues.

## 2016-05-25 NOTE — Telephone Encounter (Signed)
Pt is calling requesting a PA on Bystolic 5 mg tablet, pt states that she has only 2 pills left. Please advise

## 2016-05-25 NOTE — Telephone Encounter (Signed)
**Note De-Identified Selena Taylor Obfuscation** The pt is aware that I just did the PA for her Bystolic through covermymeds. Awaiting response.  Also we have  left samples of Bystolic 5 mg in the front office for her to pick up until we hear back from PA.

## 2016-06-02 DIAGNOSIS — H1012 Acute atopic conjunctivitis, left eye: Secondary | ICD-10-CM | POA: Diagnosis not present

## 2016-06-04 ENCOUNTER — Encounter: Payer: Self-pay | Admitting: Internal Medicine

## 2016-06-04 ENCOUNTER — Ambulatory Visit (INDEPENDENT_AMBULATORY_CARE_PROVIDER_SITE_OTHER): Payer: Medicare Other | Admitting: Internal Medicine

## 2016-06-04 VITALS — BP 125/80 | HR 73 | Temp 98.4°F | Ht 62.0 in | Wt 119.0 lb

## 2016-06-04 DIAGNOSIS — Z23 Encounter for immunization: Secondary | ICD-10-CM

## 2016-06-04 DIAGNOSIS — I482 Chronic atrial fibrillation, unspecified: Secondary | ICD-10-CM

## 2016-06-04 DIAGNOSIS — F5104 Psychophysiologic insomnia: Secondary | ICD-10-CM | POA: Diagnosis not present

## 2016-06-04 DIAGNOSIS — G459 Transient cerebral ischemic attack, unspecified: Secondary | ICD-10-CM | POA: Diagnosis not present

## 2016-06-04 DIAGNOSIS — R251 Tremor, unspecified: Secondary | ICD-10-CM

## 2016-06-04 DIAGNOSIS — I10 Essential (primary) hypertension: Secondary | ICD-10-CM | POA: Diagnosis not present

## 2016-06-04 MED ORDER — ZOLPIDEM TARTRATE 10 MG PO TABS: 10.0000 mg | ORAL_TABLET | Freq: Every evening | ORAL | 1 refills | Status: DC | PRN

## 2016-06-04 NOTE — Assessment & Plan Note (Signed)
On  Bystolic, Cartia, Altace ?

## 2016-06-04 NOTE — Assessment & Plan Note (Signed)
Chronic  Xanax prn - very rare  Potential benefits of a long term benzodiazepines  use as well as potential risks  and complications were explained to the patient and were aknowledged.

## 2016-06-04 NOTE — Assessment & Plan Note (Signed)
Cont w/ Xarelto, Bystolic, [employer]

## 2016-06-04 NOTE — Progress Notes (Signed)
Subjective:  Patient ID: Selena Taylor, female    DOB: 03/29/1935  Age: 81 y.o. MRN: [id number]  CC: No chief complaint on file.   HPI Selena Taylor presents for insomnia, tremor, A fib, HTN f/u  Outpatient Medications Prior to Visit  Medication Sig Dispense Refill  . ALPRAZolam (XANAX) 0.25 MG tablet Take 1 tablet (0.25 mg total) by mouth 2 (two) times daily as needed for anxiety. Must see MD for future refills 60 tablet 0  . diltiazem (CARTIA XT) 240 MG 24 hr capsule Take 1 capsule (240 mg total) by mouth daily. 90 capsule 3  . Grape Seed 100 MG CAPS Take 1 capsule by mouth daily.     . nebivolol (BYSTOLIC) 5 MG tablet Take 1 tablet (5 mg total) by mouth daily. 30 tablet 3  . ramipril (ALTACE) 10 MG capsule Take 1 capsule (10 mg total) by mouth daily. 90 capsule 3  . rivaroxaban (XARELTO) 20 MG TABS tablet Take 1 tablet (20 mg total) by mouth daily. 30 tablet 11  . zolpidem (AMBIEN) 10 MG tablet Take 1 tablet (10 mg total) by mouth at bedtime as needed. 90 tablet 1   No facility-administered medications prior to visit.     ROS Review of Systems  Constitutional: Negative for activity change, appetite change, chills, fatigue and unexpected weight change.  HENT: Negative for congestion, mouth sores and sinus pressure.   Eyes: Negative for visual disturbance.  Respiratory: Negative for cough and chest tightness.   Gastrointestinal: Negative for abdominal pain and nausea.  Genitourinary: Negative for difficulty urinating, frequency and vaginal pain.  Musculoskeletal: Negative for back pain and gait problem.  Skin: Negative for pallor and rash.  Neurological: Positive for tremors. Negative for dizziness, weakness, numbness and headaches.  Psychiatric/Behavioral: Positive for sleep disturbance. Negative for confusion and suicidal ideas. The patient is not nervous/anxious.     Objective:  BP 125/80   Pulse 73   Temp 98.4 F (36.9 C) (Oral)   Ht 5\' 2"  (1.575 m)   Wt 119 lb (54 kg)    SpO2 99%   BMI 21.77 kg/m   BP Readings from Last 3 Encounters:  06/04/16 125/80  05/24/16 122/64  07/15/15 128/84    Wt Readings from Last 3 Encounters:  06/04/16 119 lb (54 kg)  05/24/16 117 lb (53.1 kg)  07/15/15 119 lb 12.8 oz (54.3 kg)    Physical Exam  Constitutional: She appears well-developed. No distress.  HENT:  Head: Normocephalic.  Right Ear: External ear normal.  Left Ear: External ear normal.  Nose: Nose normal.  Mouth/Throat: Oropharynx is clear and moist.  Eyes: Conjunctivae are normal. Pupils are equal, round, and reactive to light. Right eye exhibits no discharge. Left eye exhibits no discharge.  Neck: Normal range of motion. Neck supple. No JVD present. No tracheal deviation present. No thyromegaly present.  Cardiovascular: Normal heart sounds.   Pulmonary/Chest: No stridor. No respiratory distress. She has no wheezes.  Abdominal: Soft. Bowel sounds are normal. She exhibits no distension and no mass. There is no tenderness. There is no rebound and no guarding.  Musculoskeletal: She exhibits no edema or tenderness.  Lymphadenopathy:    She has no cervical adenopathy.  Neurological: She displays normal reflexes. No cranial nerve deficit. She exhibits normal muscle tone. Coordination normal.  Skin: No rash noted. No erythema.  Psychiatric: She has a normal mood and affect. Her behavior is normal. Judgment and thought content normal.  irreg irreg RR  Lab  Results  Component Value Date   WBC 4.9 07/16/2015   HGB 13.9 07/16/2015   HCT 41.6 07/16/2015   PLT 215 07/16/2015   GLUCOSE 90 07/16/2015   CHOL 163 10/15/2014   TRIG 93.0 10/15/2014   HDL 79.80 10/15/2014   LDLCALC 65 10/15/2014   ALT 17 10/15/2014   AST 30 10/15/2014   NA 138 07/16/2015   K 4.6 07/16/2015   CL 105 07/16/2015   CREATININE 0.89 07/16/2015   BUN 14 07/16/2015   CO2 23 07/16/2015   TSH 0.66 10/15/2014   INR 2.03 (H) 02/29/2012   HGBA1C 6.5 01/28/2014    Mm Screening  Breast Tomo Bilateral  Result Date: 06/18/2015 CLINICAL DATA:  Screening. EXAM: 2D DIGITAL SCREENING BILATERAL MAMMOGRAM WITH CAD AND ADJUNCT TOMO COMPARISON:  Previous exam(s). ACR Breast Density Category c: The breast tissue is heterogeneously dense, which may obscure small masses. FINDINGS: There are no findings suspicious for malignancy. Images were processed with CAD. IMPRESSION: No mammographic evidence of malignancy. A result letter of this screening mammogram will be mailed directly to the patient. RECOMMENDATION: Screening mammogram in one year. (Code:SM-B-01Y) BI-RADS CATEGORY  1: Negative. Electronically Signed   By: Rolla Plateandolph  Jackson M.D.   On: 06/18/2015 14:02    Assessment & Plan:   Diagnoses and all orders for this visit:  Chronic atrial fibrillation (HCC)  Essential hypertension -     Basic metabolic panel; Future -     Hepatic function panel; Future -     TSH; Future  Transient cerebral ischemia, unspecified type -     Basic metabolic panel; Future -     Hepatic function panel; Future -     TSH; Future  Psychophysiological insomnia  Other orders -     zolpidem (AMBIEN) 10 MG tablet; Take 1 tablet (10 mg total) by mouth at bedtime as needed.   I am having Selena Taylor maintain her Grape Seed, rivaroxaban, diltiazem, ramipril, ALPRAZolam, nebivolol, and zolpidem.  Meds ordered this encounter  Medications  . zolpidem (AMBIEN) 10 MG tablet    Sig: Take 1 tablet (10 mg total) by mouth at bedtime as needed.    Dispense:  90 tablet    Refill:  1     Follow-up: Return in about 6 months (around 12/05/2016) for a follow-up visit.  Sonda PrimesAlex Plotnikov, MD

## 2016-06-04 NOTE — Assessment & Plan Note (Signed)
Zolpidem prn  Potential benefits of a long term benzodiazepines  use as well as potential risks  and complications were explained to the patient and were aknowledged. 

## 2016-06-04 NOTE — Assessment & Plan Note (Signed)
Cont w/ Bystolic, Cartia, Altace, Xarelto

## 2016-06-04 NOTE — Addendum Note (Signed)
Addended by: Scarlett PrestoFRIEDENBACH, Jeramey Lanuza on: 06/04/2016 03:33 PM   Modules accepted: Orders

## 2016-06-07 NOTE — Telephone Encounter (Signed)
**Note De-Identified Bonita Brindisi Obfuscation** Received an approval message through covermymeds that the pts Bystolic PA was approved. Approval good from 05/25/16 until 05/25/17.

## 2016-06-16 ENCOUNTER — Other Ambulatory Visit: Payer: Medicare Other | Admitting: *Deleted

## 2016-06-16 DIAGNOSIS — I10 Essential (primary) hypertension: Secondary | ICD-10-CM | POA: Diagnosis not present

## 2016-06-16 DIAGNOSIS — G459 Transient cerebral ischemic attack, unspecified: Secondary | ICD-10-CM

## 2016-06-16 LAB — BASIC METABOLIC PANEL
BUN/Creatinine Ratio: 17 (ref 12–28)
BUN: 17 mg/dL (ref 8–27)
CALCIUM: 8.9 mg/dL (ref 8.7–10.3)
CHLORIDE: 103 mmol/L (ref 96–106)
CO2: 18 mmol/L (ref 18–29)
CREATININE: 1.02 mg/dL — AB (ref 0.57–1.00)
GFR calc non Af Amer: 53 mL/min/{1.73_m2} — ABNORMAL LOW (ref >59)
GFR, EST AFRICAN AMERICAN: 61 mL/min/{1.73_m2} (ref >59)
Glucose: 102 mg/dL — ABNORMAL HIGH (ref 65–99)
Potassium: 4.6 mmol/L (ref 3.5–5.2)
Sodium: 140 mmol/L (ref 134–144)

## 2016-06-16 LAB — TSH: TSH: 1.65 u[IU]/mL (ref 0.450–4.500)

## 2016-06-16 LAB — HEPATIC FUNCTION PANEL
ALBUMIN: 4.2 g/dL (ref 3.5–4.8)
ALK PHOS: 89 IU/L (ref 39–117)
ALT: 14 IU/L (ref 0–32)
AST: 21 IU/L (ref 0–40)
BILIRUBIN TOTAL: 0.4 mg/dL (ref 0.0–1.2)
BILIRUBIN, DIRECT: 0.15 mg/dL (ref 0.00–0.40)
TOTAL PROTEIN: 7 g/dL (ref 6.0–8.5)

## 2016-06-16 NOTE — Progress Notes (Signed)
[id number] 

## 2016-06-22 ENCOUNTER — Ambulatory Visit
Admission: RE | Admit: 2016-06-22 | Discharge: 2016-06-22 | Disposition: A | Discharge status: Home / Self Care | Payer: Medicare Other | Source: Ambulatory Visit | Attending: Internal Medicine | Admitting: Internal Medicine

## 2016-06-22 DIAGNOSIS — Z1231 Encounter for screening mammogram for malignant neoplasm of breast: Secondary | ICD-10-CM | POA: Diagnosis not present

## 2016-06-22 DIAGNOSIS — M8588 Other specified disorders of bone density and structure, other site: Secondary | ICD-10-CM | POA: Diagnosis not present

## 2016-06-22 DIAGNOSIS — E2839 Other primary ovarian failure: Secondary | ICD-10-CM

## 2016-06-22 DIAGNOSIS — Z780 Asymptomatic menopausal state: Secondary | ICD-10-CM | POA: Diagnosis not present

## 2016-06-25 ENCOUNTER — Other Ambulatory Visit: Payer: Self-pay | Admitting: Internal Medicine

## 2016-06-28 ENCOUNTER — Other Ambulatory Visit: Payer: Self-pay | Admitting: *Deleted

## 2016-06-28 NOTE — Telephone Encounter (Addendum)
Refill request received for Xarelto 20mg ; this is the 2nd request- 06/25/2016 [facility] staff sent a note to Dr. Graciela HusbandsKlein regarding dose change according to quick note on the refill request. P Pt is a 81 yrs old female, Wt-54 kg, Crea-1.02 on 06/16/16, last seen by Dr. Graciela HusbandsKlein on 07/15/15 & appt scheduled on 07/27/16 with Dr. Graciela HusbandsKlein, CrCl-38.1975ml/min. Per dosing criteria pt needs to be on Xarelto 15mg  daily.  Will await response from Dr. Graciela HusbandsKlein before changing dose.

## 2016-06-29 ENCOUNTER — Other Ambulatory Visit: Payer: Self-pay | Admitting: *Deleted

## 2016-06-29 MED ORDER — RIVAROXABAN 20 MG PO TABS: 20.0000 mg | ORAL_TABLET | Freq: Every day | ORAL | 0 refills | Status: DC

## 2016-07-02 ENCOUNTER — Telehealth: Payer: Self-pay | Admitting: *Deleted

## 2016-07-02 MED ORDER — RIVAROXABAN 15 MG PO TABS: 15.0000 mg | ORAL_TABLET | Freq: Every day | ORAL | 5 refills | Status: DC

## 2016-07-02 NOTE — Telephone Encounter (Signed)
Pt instructed that she now needs to be on Xarelto 15mg  daily and she states understanding

## 2016-07-02 NOTE — Telephone Encounter (Signed)
-----   Message from Duke SalviaSteven C Klein, MD sent at 07/02/2016  8:08 AM EDT ----- Perfect   Thanks  ----- Message ----- From: Jeannine KittenWalker, Sharon G, RN Sent: 06/25/2016   1:13 PM To: Duke SalviaSteven C Klein, MD  Dr Graciela HusbandsKlein Had order to refill Xarelto 20 mg daily on pt and on doing her Creatinine Clearance results  38.74 so according to dosing instructions needs Xarelto 15mg  daily Please advise  Thank You Lelon PerlaSharon Walker RN

## 2016-07-02 NOTE — Telephone Encounter (Signed)
Age 81 years Wt 54 kg (06/04/2016) Saw Dr Graciela HusbandsKlein on 07/15/2015 and has an appt to see Dr Graciela HusbandsKlein on July 3rd 2018  06/16/2016 SrCr 1.02 07/16/2015 Hgb 13.9 HCT 41.6 CrCl 38.74  Message to Dr Graciela HusbandsKlein and he approves change to Xarelto 15mg  daily so refill done for this dose and sample of Xarelto 15mg   given for 1 month as she had been ordered Xarelto 20 mg daily and had already picked these up

## 2016-07-07 ENCOUNTER — Telehealth: Payer: Self-pay | Admitting: *Deleted

## 2016-07-07 NOTE — Telephone Encounter (Signed)
-----   Message from Duke SalviaSteven C Klein, MD sent at 07/06/2016  9:59 PM EDT ----- Bonita QuinYou are correct Thanks  ----- Message ----- From: Jeannine KittenWalker, Makalya Nave G, RN Sent: 06/25/2016   1:13 PM To: Duke SalviaSteven C Klein, MD  Dr Graciela HusbandsKlein Had order to refill Xarelto 20 mg daily on pt and on doing her Creatinine Clearance results  38.74 so according to dosing instructions needs Xarelto 15mg  daily Please advise  Thank You Lelon PerlaSharon Elleanna Melling RN

## 2016-07-07 NOTE — Telephone Encounter (Signed)
Pt has been made aware of dose change to Xarelto 15mg  daily and she has stated understanding

## 2016-07-20 ENCOUNTER — Telehealth: Payer: Self-pay

## 2016-07-20 ENCOUNTER — Telehealth: Payer: Self-pay | Admitting: Internal Medicine

## 2016-07-20 NOTE — Telephone Encounter (Signed)
Generic of ambien 10 mg has been approved for one full year from today 6/26.  Will fax approval letter over.

## 2016-07-20 NOTE — Telephone Encounter (Signed)
PA for zolpidem 10 mg tablets has been initiated.   KEY: [id number]

## 2016-07-27 ENCOUNTER — Encounter: Payer: Self-pay | Admitting: Internal Medicine

## 2016-07-27 ENCOUNTER — Ambulatory Visit (INDEPENDENT_AMBULATORY_CARE_PROVIDER_SITE_OTHER): Payer: Medicare Other | Admitting: Internal Medicine

## 2016-07-27 ENCOUNTER — Encounter (INDEPENDENT_AMBULATORY_CARE_PROVIDER_SITE_OTHER): Payer: Self-pay

## 2016-07-27 VITALS — BP 124/80 | HR 73 | Ht 62.0 in | Wt 120.0 lb

## 2016-07-27 DIAGNOSIS — I482 Chronic atrial fibrillation, unspecified: Secondary | ICD-10-CM

## 2016-07-27 MED ORDER — NEBIVOLOL HCL 5 MG PO TABS: 5.0000 mg | ORAL_TABLET | Freq: Every day | ORAL | 11 refills | Status: DC

## 2016-07-27 MED ORDER — RIVAROXABAN 15 MG PO TABS: 15.0000 mg | ORAL_TABLET | Freq: Every day | ORAL | 11 refills | Status: DC

## 2016-07-27 MED ORDER — DILTIAZEM HCL ER COATED BEADS 240 MG PO CP24: 240.0000 mg | ORAL_CAPSULE | Freq: Every day | ORAL | 11 refills | Status: DC

## 2016-07-27 MED ORDER — RAMIPRIL 10 MG PO CAPS: 10.0000 mg | ORAL_CAPSULE | Freq: Every day | ORAL | 11 refills | Status: DC

## 2016-07-27 NOTE — Progress Notes (Signed)
Patient Care Team: Plotnikov, Georgina QuintAleksei V, MD as PCP - General (Internal Medicine) Duke SalviaKlein, Koreena Joost C, MD as Consulting Physician (Cardiology) Venancio PoissonLomax, Laura, MD as Consulting Physician (Dermatology)   HPI  Selena Taylor is a 81 y.o. female Seen in followup for atrial fibrillation and pericarditis. Because of persistent symptoms she had undergone a pericardial window.  She takes Rivaroxaban      Her husband has died currently. Her son has moved been  [place]. Life is much much better.  Date Cr Hgb  6/17 0.89 13.9  5/18 1.02      The patient denies chest pain, shortness of breath, nocturnal dyspnea, orthopnea or peripheral edema.  There have been no palpitations, lightheadedness or syncope.    No bleeding    Past Medical History:  Diagnosis Date  . Anxiety   . CHF (congestive heart failure) (HCC)   . Chronic anticoagulation    on coumadin  . Endometriosis   . HTN (hypertension)   . Insomnia   . Osteopenia   . PAF (paroxysmal atrial fibrillation) (HCC)    has been on Flecainide in the past. Stopped 02/10/11; Remains on coumadin anticoagulation; intol to Rythmol and failed DCCV; noted to be in AFlutter 5/13;  Echo 01/2011:  EF 55-60%, mild MR, mild LAE.  Myoview 6/12:  No ischemia, EF 74%  . Pericardial effusion   . Pericardial effusion 02/23/2012  . Shortness of breath   . TIA (transient ischemic attack)     Past Surgical History:  Procedure Laterality Date  . APPENDECTOMY    . CARDIOVERSION  01/14/2011   Procedure: CARDIOVERSION;  Surgeon: Duke SalviaSteven C Iriana Artley, MD;  [facility];  Service: Cardiovascular;  Laterality: N/A;  To be completed in Neuro OR 33 time slot 0830 12/20  . CARDIOVERSION  05/27/2011   Procedure: CARDIOVERSION;  Surgeon: Duke SalviaSteven C Konstantin Lehnen, MD;  [facility];  Service: Cardiovascular;  Laterality: N/A;  . PERICARDIAL WINDOW  02/25/2012   Procedure: PERICARDIAL WINDOW;  Surgeon: Delight OvensEdward B Gerhardt, MD;  [facility];  Service: Thoracic;  Laterality:  N/A;  . POLYPECTOMY    . skin cancer removal    . TEE WITHOUT CARDIOVERSION  02/25/2012   Procedure: TRANSESOPHAGEAL ECHOCARDIOGRAM (TEE);  Surgeon: Delight OvensEdward B Gerhardt, MD;  [facility];  Service: Thoracic;  Laterality: N/A;  . TOTAL HYSTERECTOMY AND BILATERAL SALPINGOOPHERECTOMY      Current Outpatient Prescriptions  Medication Sig Dispense Refill  . ALPRAZolam (XANAX) 0.25 MG tablet Take 1 tablet (0.25 mg total) by mouth 2 (two) times daily as needed for anxiety. Must see MD for future refills 60 tablet 0  . diltiazem (CARTIA XT) 240 MG 24 hr capsule Take 1 capsule (240 mg total) by mouth daily. 90 capsule 3  . Grape Seed 100 MG CAPS Take 1 capsule by mouth daily.     . nebivolol (BYSTOLIC) 5 MG tablet Take 1 tablet (5 mg total) by mouth daily. 30 tablet 3  . ramipril (ALTACE) 10 MG capsule Take 1 capsule (10 mg total) by mouth daily. 90 capsule 3  . Rivaroxaban (XARELTO) 15 MG TABS tablet Take 1 tablet (15 mg total) by mouth daily with supper. 30 tablet 5  . zolpidem (AMBIEN) 10 MG tablet Take 1 tablet (10 mg total) by mouth at bedtime as needed. 90 tablet 1   No current facility-administered medications for this visit.     Allergies  Allergen Reactions  . Other Other (See Comments)  Novocaine.  REACTION:  Rapid heart rate  . Procaine Hcl     Rapid heart rate    Review of Systems negative except from HPI and PMH  Physical Exam BP 124/80   Pulse 73   Ht 5\' 2"  (1.575 m)   Wt 120 lb (54.4 kg)   SpO2 96%   BMI 21.95 kg/m  Well developed and nourished in no acute distress HENT normal Neck supple with JVP-flat Carotids brisk and full without bruits Clear irergularlly irregular rate and rhythm, no murmurs or gallops Abd-soft with active BS without hepatomegaly No Clubbing cyanosis edema Skin-warm and dry A & Oriented  Grossly normal sensory and motor function     ECG was ordered today demonstrates atrial fibrillation at a rate of 79 -/07/39 Nonspecific ST-T  changes   Assessment and  Plan  Atrial fibrillation with a Controlled ventricular response   Renal insufficiency   Hypertension    Blood pressure well controlled  Rivaroxaban dose adjusted for renal function  We'll check hemoglobin  We spent more than 50% of our >25 min visit in face to face counseling regarding the above   She was asking about bone density. I pressure off the information from up-to-date.

## 2016-07-27 NOTE — Patient Instructions (Signed)
Medication Instructions: - Your physician recommends that you continue on your current medications as directed. Please refer to the Current Medication list given to you today.  Labwork: - Your physician recommends that you have lab work today: CBC  Procedures/Testing: - none ordered  Follow-Up: - Your physician wants you to follow-up in: 1 year with Francis Dowseenee Ursuy, PA for Dr. Graciela HusbandsKlein. You will receive a reminder letter in the mail two months in advance. If you don't receive a letter, please call our office to schedule the follow-up appointment.   Any Additional Special Instructions Will Be Listed Below (If Applicable).     If you need a refill on your cardiac medications before your next appointment, please call your pharmacy.

## 2016-07-28 LAB — CBC WITH DIFFERENTIAL/PLATELET
BASOS ABS: 0.1 10*3/uL (ref 0.0–0.2)
Basos: 1 %
EOS (ABSOLUTE): 0.3 10*3/uL (ref 0.0–0.4)
Eos: 4 %
HEMOGLOBIN: 13.8 g/dL (ref 11.1–15.9)
Hematocrit: 40.5 % (ref 34.0–46.6)
Immature Grans (Abs): 0 10*3/uL (ref 0.0–0.1)
Immature Granulocytes: 1 %
LYMPHS ABS: 2.6 10*3/uL (ref 0.7–3.1)
Lymphs: 40 %
MCH: 33.2 pg — AB (ref 26.6–33.0)
MCHC: 34.1 g/dL (ref 31.5–35.7)
MCV: 97 fL (ref 79–97)
MONOCYTES: 9 %
MONOS ABS: 0.6 10*3/uL (ref 0.1–0.9)
NEUTROS ABS: 3 10*3/uL (ref 1.4–7.0)
Neutrophils: 45 %
Platelets: 195 10*3/uL (ref 150–379)
RBC: 4.16 x10E6/uL (ref 3.77–5.28)
RDW: 13.8 % (ref 12.3–15.4)
WBC: 6.6 10*3/uL (ref 3.4–10.8)

## 2016-08-13 ENCOUNTER — Encounter: Payer: Self-pay | Admitting: Internal Medicine

## 2016-08-13 ENCOUNTER — Ambulatory Visit (INDEPENDENT_AMBULATORY_CARE_PROVIDER_SITE_OTHER): Payer: Medicare Other | Admitting: Internal Medicine

## 2016-08-13 ENCOUNTER — Other Ambulatory Visit: Payer: Medicare Other | Admitting: *Deleted

## 2016-08-13 ENCOUNTER — Other Ambulatory Visit: Payer: Self-pay

## 2016-08-13 DIAGNOSIS — M810 Age-related osteoporosis without current pathological fracture: Secondary | ICD-10-CM

## 2016-08-13 DIAGNOSIS — E559 Vitamin D deficiency, unspecified: Secondary | ICD-10-CM

## 2016-08-13 MED ORDER — VITAMIN D3 50 MCG (2000 UT) PO CAPS: 2000.0000 [IU] | ORAL_CAPSULE | Freq: Every day | ORAL | 3 refills | Status: AC

## 2016-08-13 NOTE — Progress Notes (Signed)
Vit d 

## 2016-08-13 NOTE — Addendum Note (Signed)
Addended by: Tonita PhoenixBOWDEN, Irelynn Schermerhorn K on: 08/13/2016 02:23 PM   Modules accepted: Orders

## 2016-08-13 NOTE — Addendum Note (Signed)
Addended by: Tonita PhoenixBOWDEN, Korynn Kenedy K on: 08/13/2016 02:27 PM   Modules accepted: Orders

## 2016-08-13 NOTE — Patient Instructions (Signed)

## 2016-08-13 NOTE — Addendum Note (Signed)
Addended by: Tonita PhoenixBOWDEN, Satya Bohall K on: 08/13/2016 02:28 PM   Modules accepted: Orders

## 2016-08-13 NOTE — Assessment & Plan Note (Addendum)
Ca Vit D Start Prolia. Other options discussed BMET

## 2016-08-13 NOTE — Progress Notes (Signed)
Subjective:  Patient ID: Selena Taylor, female    DOB: [birth year]  Age: 81 y.o. MRN: [id number]  CC: No chief complaint on file.   HPI Selena Taylor presents for osteoporhosis  Outpatient Medications Prior to Visit  Medication Sig Dispense Refill  . ALPRAZolam (XANAX) 0.25 MG tablet Take 1 tablet (0.25 mg total) by mouth 2 (two) times daily as needed for anxiety. Must see MD for future refills 60 tablet 0  . diltiazem (CARTIA XT) 240 MG 24 hr capsule Take 1 capsule (240 mg total) by mouth daily. 30 capsule 11  . Grape Seed 100 MG CAPS Take 1 capsule by mouth daily.     . nebivolol (BYSTOLIC) 5 MG tablet Take 1 tablet (5 mg total) by mouth daily. 30 tablet 11  . ramipril (ALTACE) 10 MG capsule Take 1 capsule (10 mg total) by mouth daily. 30 capsule 11  . Rivaroxaban (XARELTO) 15 MG TABS tablet Take 1 tablet (15 mg total) by mouth daily with supper. 30 tablet 11  . zolpidem (AMBIEN) 10 MG tablet Take 1 tablet (10 mg total) by mouth at bedtime as needed. 90 tablet 1   No facility-administered medications prior to visit.     ROS Review of Systems  Constitutional: Negative for activity change, appetite change, chills, fatigue and unexpected weight change.  HENT: Negative for congestion, mouth sores and sinus pressure.   Eyes: Negative for visual disturbance.  Respiratory: Negative for cough and chest tightness.   Gastrointestinal: Negative for abdominal pain and nausea.  Genitourinary: Negative for difficulty urinating, frequency and vaginal pain.  Musculoskeletal: Negative for back pain and gait problem.  Skin: Negative for pallor and rash.  Neurological: Negative for dizziness, tremors, weakness, numbness and headaches.  Psychiatric/Behavioral: Negative for confusion and sleep disturbance.    Objective:  BP 124/84 (BP Location: Left Arm, Patient Position: Sitting, Cuff Size: Normal)   Pulse 65   Temp 98 F (36.7 C) (Oral)   Ht 5\' 2"  (1.575 m)   Wt 118 lb (53.5 kg)   SpO2 99%    BMI 21.58 kg/m   BP Readings from Last 3 Encounters:  08/13/16 124/84  07/27/16 124/80  06/04/16 125/80    Wt Readings from Last 3 Encounters:  08/13/16 118 lb (53.5 kg)  07/27/16 120 lb (54.4 kg)  06/04/16 119 lb (54 kg)    Physical Exam  Constitutional: She appears well-developed. No distress.  HENT:  Head: Normocephalic.  Right Ear: External ear normal.  Left Ear: External ear normal.  Nose: Nose normal.  Mouth/Throat: Oropharynx is clear and moist.  Eyes: Pupils are equal, round, and reactive to light. Conjunctivae are normal. Right eye exhibits no discharge. Left eye exhibits no discharge.  Neck: Normal range of motion. Neck supple. No JVD present. No tracheal deviation present. No thyromegaly present.  Cardiovascular: Normal rate, regular rhythm and normal heart sounds.   Pulmonary/Chest: No stridor. No respiratory distress. She has no wheezes.  Abdominal: Soft. Bowel sounds are normal. She exhibits no distension and no mass. There is no tenderness. There is no rebound and no guarding.  Musculoskeletal: She exhibits no edema or tenderness.  Lymphadenopathy:    She has no cervical adenopathy.  Neurological: She displays normal reflexes. No cranial nerve deficit. She exhibits normal muscle tone. Coordination normal.  Skin: No rash noted. No erythema.  Psychiatric: She has a normal mood and affect. Her behavior is normal. Judgment and thought content normal.    I spent total of >20  minutes face-to-face with patient and greater than 50% was spent counseling and or coordinating care - we discussed the need of vit d and Ca intake. Options for osteoporosis were discussed   Lab Results  Component Value Date   WBC 6.6 07/27/2016   HGB 13.8 07/27/2016   HCT 40.5 07/27/2016   PLT 195 07/27/2016   GLUCOSE 102 (H) 06/16/2016   CHOL 163 10/15/2014   TRIG 93.0 10/15/2014   HDL 79.80 10/15/2014   LDLCALC 65 10/15/2014   ALT 14 06/16/2016   AST 21 06/16/2016   NA 140  06/16/2016   K 4.6 06/16/2016   CL 103 06/16/2016   CREATININE 1.02 (H) 06/16/2016   BUN 17 06/16/2016   CO2 18 06/16/2016   TSH 1.650 06/16/2016   INR 2.03 (H) 02/29/2012   HGBA1C 6.5 01/28/2014    Dexascan  Result Date: 06/22/2016 EXAM: DUAL X-RAY ABSORPTIOMETRY (DXA) FOR BONE MINERAL DENSITY IMPRESSION: Referring Physician:  Tresa Garter PATIENT: Name: Selena Taylor, Selena Taylor Patient ID: [id number] Birth Date: 1935-09-27 Height: 62.0 in. Sex: Female Measured: 06/22/2016 Weight: 118.8 lbs. Indications: Advanced Age, Caucasian, Estrogen Deficient, Hysterectomy, Postmenopausal Fractures: None Treatments: Calcium (E943.0) ASSESSMENT: The BMD measured at Femur Total Right is 0.611 g/cm2 with a T-score of -3.1. This patient is considered osteoporotic according to World Health Organization [facility]) criteria. There has been a statistically significant decrease in BMD of Left hip, and a statistically significant increase in BMD of Lumbar Spine since prior exam dated 03/25/2014. Site Region Measured Date Measured Age YA BMD Significant CHANGE T-score DualFemur Total Right 8/22/2016    78.6         -3.1    0.611 g/cm2 AP Spine  L1-L4       8/22/2016    [age]         -2.1    0.935 g/cm2 World Health Organization [facility]) criteria for post-menopausal, Caucasian Women: Normal       T-score at or above -1 SD Osteopenia   T-score between -1 and -2.5 SD Osteoporosis T-score at or below -2.5 SD RECOMMENDATION: National Osteoporosis Foundation recommends that FDA-approved medical therapies be considered in postmenopausal women and men age 70 or older with a: 1. Hip or vertebral (clinical or morphometric) fracture. 2. T-score of <-2.5 at the spine or hip. 3. Ten-year fracture probability by FRAX of 3% or greater for hip fracture or 20% or greater for major osteoporotic fracture. All treatment decisions require clinical judgment and consideration of individual patient factors, including patient preferences, co-morbidities,  previous drug use, risk factors not captured in the FRAX model (e.g. falls, vitamin D deficiency, increased bone turnover, interval significant decline in bone density) and possible under - or over-estimation of fracture risk by FRAX. All patients should ensure an adequate intake of dietary calcium (1200 mg/d) and vitamin D (800 IU daily) unless contraindicated. FOLLOW-UP: People with diagnosed cases of osteoporosis or at high risk for fracture should have regular bone mineral density tests. For patients eligible for Medicare, routine testing is allowed once every 2 years. The testing frequency can be increased to one year for patients who have rapidly progressing disease, those who are receiving or discontinuing medical therapy to restore bone mass, or have additional risk factors. I have reviewed this report, and agree with the above findings. [facility] Radiology Electronically Signed   By: Myles Rosenthal M.D.   On: 06/22/2016 10:32   Mm Screening Breast Tomo Bilateral  Result Date: 06/22/2016 CLINICAL DATA:  Screening. EXAM: 2D DIGITAL  SCREENING BILATERAL MAMMOGRAM WITH CAD AND ADJUNCT TOMO COMPARISON:  Previous exam(s). ACR Breast Density Category c: The breast tissue is heterogeneously dense, which may obscure small masses. FINDINGS: There are no findings suspicious for malignancy. Images were processed with CAD. IMPRESSION: No mammographic evidence of malignancy. A result letter of this screening mammogram will be mailed directly to the patient. RECOMMENDATION: Screening mammogram in one year. (Code:SM-B-01Y) BI-RADS CATEGORY  1: Negative. Electronically Signed   By: Bary RichardStan  Maynard M.D.   On: 06/22/2016 16:59    Assessment & Plan:   There are no diagnoses linked to this encounter. I am having Selena Taylor maintain her Grape Seed, ALPRAZolam, zolpidem, Rivaroxaban, ramipril, nebivolol, and diltiazem.  No orders of the defined types were placed in this encounter.    Follow-up: No Follow-up on  file.  Sonda PrimesAlex Ardyn Forge, MD

## 2016-08-14 LAB — VITAMIN D 25 HYDROXY (VIT D DEFICIENCY, FRACTURES): VIT D 25 HYDROXY: 34.4 ng/mL (ref 30.0–100.0)

## 2016-08-19 ENCOUNTER — Telehealth: Payer: Self-pay | Admitting: Internal Medicine

## 2016-08-19 NOTE — Telephone Encounter (Signed)
Results have not been reviewed by Dr. Posey ReaPlotnikov, please advise.

## 2016-08-19 NOTE — Telephone Encounter (Signed)
Low normal of vitamin D. Continue 2000IU daily supplement

## 2016-08-19 NOTE — Telephone Encounter (Signed)
Pt called checking on Vitamin D results from 08/13/2016. She would like a call back.

## 2016-08-20 NOTE — Telephone Encounter (Signed)
Pt notified, pt would like to know if the Prolia injection is safe for her with her chronic a-fib and with her taking Xarelto. She states she read up the she shot and the are side effects with these two. Please advise.  She knows you return Wednesday.

## 2016-08-25 NOTE — Telephone Encounter (Signed)
It is safe with Prolia and A fib Thx

## 2016-08-26 NOTE — Telephone Encounter (Signed)
LMTCB

## 2016-08-26 NOTE — Telephone Encounter (Signed)
Selena Taylor will be verifying insurance and call patient back with results

## 2016-08-26 NOTE — Telephone Encounter (Signed)
Patient is okay with starting Prolia.

## 2016-09-06 ENCOUNTER — Telehealth: Payer: Self-pay | Admitting: Internal Medicine

## 2016-09-06 NOTE — Telephone Encounter (Signed)
Atrial fibrillation is not a contraindication to receive Prolia and no significant drug-drug interactions exist between xarelto and prolia.  If initiated , patient should monitor for any potential adverse reaction but increase heartbeat and increase bleeding are associate to more rare severe reaction and not to most common ide effect of the medication.

## 2016-09-06 NOTE — Telephone Encounter (Signed)
Called patient and relayed information provided by PharmD.  She is aware that I am also forwarding to Dr. Graciela HusbandsKlein now for his review per her request.

## 2016-09-06 NOTE — Telephone Encounter (Signed)
Routed to PharmD for recommendations and then will call patient back.

## 2016-09-06 NOTE — Telephone Encounter (Signed)
Patient calling, states that at a visit with her internal MD that it was suggested to she start Prolia for osteoporosis. Patient states that some side effects are rapid heartbeat and excessive bleeding. Patient states that she has chronic AFIB and is on Xarelto. Patient would like to know if it would interfere.

## 2016-09-08 NOTE — Telephone Encounter (Signed)
Nothing to add

## 2016-09-21 ENCOUNTER — Other Ambulatory Visit: Payer: Self-pay | Admitting: Internal Medicine

## 2016-10-12 ENCOUNTER — Telehealth: Payer: Self-pay

## 2016-10-12 NOTE — Telephone Encounter (Signed)
Patient has been advised that prolia insurance has been submitted for verification---per summary of benefits, patient needs to pay $220 copay---can schedule nurse visit at her convenience, this will be her first injection

## 2016-10-18 ENCOUNTER — Telehealth: Payer: Self-pay | Admitting: Internal Medicine

## 2016-10-18 NOTE — Telephone Encounter (Signed)
Pt does not want to receive the Prolia anymore because of the side affects, she would like to know what else she can take, but she does not want the injection. Please advise and call back.

## 2016-10-18 NOTE — Telephone Encounter (Signed)
Checked for drug interactions between Prolia and the medications pt is taking-none found.  Advised patient.  She is concerned that in reading the side effects of the medication it states if can increase HR.  Pt's HR is generally in the 70s.  Advised as long as it is between 60 and 100 bpm there should be no problem.  She thanked me for my call and the information.

## 2016-10-18 NOTE — Telephone Encounter (Signed)
New Message  Pt call requesting to speak with RN. Pt states her PCP prescribed her prolia. Pt would like to discuss with RN if this medication would be okay to take. Please call back to discuss

## 2016-10-18 NOTE — Telephone Encounter (Signed)
Patient would like for me to call her back tomorrow around 11am when her son is there

## 2016-10-19 NOTE — Telephone Encounter (Signed)
Called patient back, she asked if I would talk with dr Posey Rea to get his opinion on starting biphosphinates,or reclast vs. Using prolia---I have advised that dr plotnikov feels prolia is best option we have right now---it has least side of effects of the three choices---patient has scheduled nurse visit for 9/26

## 2016-10-20 ENCOUNTER — Ambulatory Visit (INDEPENDENT_AMBULATORY_CARE_PROVIDER_SITE_OTHER): Payer: Medicare Other

## 2016-10-20 ENCOUNTER — Ambulatory Visit: Payer: Medicare Other

## 2016-10-20 NOTE — Telephone Encounter (Signed)
Patient came in today and paid $220 copay, but has decided to reschedule her nurse visit for prolia injection until after she sees dentist on oct. 8th---patient advised to call Sela Falk back and let me know if she gets ok to do by her dentist, then she can be rescheduled, copay has already been paid---if patient decides not to get prolia, we need to refund her $220 copay---can talk with Muath Hallam if any questions---a prolia injection has been placed in cindy's refrigerator with patient's name

## 2016-11-10 DIAGNOSIS — Z85828 Personal history of other malignant neoplasm of skin: Secondary | ICD-10-CM | POA: Diagnosis not present

## 2016-11-10 DIAGNOSIS — D485 Neoplasm of uncertain behavior of skin: Secondary | ICD-10-CM | POA: Diagnosis not present

## 2016-11-10 DIAGNOSIS — L570 Actinic keratosis: Secondary | ICD-10-CM | POA: Diagnosis not present

## 2016-11-10 DIAGNOSIS — L821 Other seborrheic keratosis: Secondary | ICD-10-CM | POA: Diagnosis not present

## 2016-11-10 DIAGNOSIS — D0462 Carcinoma in situ of skin of left upper limb, including shoulder: Secondary | ICD-10-CM | POA: Diagnosis not present

## 2016-11-12 ENCOUNTER — Ambulatory Visit: Payer: Medicare Other

## 2016-11-17 NOTE — Telephone Encounter (Signed)
Patient already has a nurse visit scheduled for tomorrow, getting flu vaccine---I have left message reminding her that she can also get her prolia injection at that same visit, with copay ALREADY PAID---if patient has changed her mind, she will need to be refunded prolia copay $220---can talk with tamara if any questions

## 2016-11-18 ENCOUNTER — Ambulatory Visit (INDEPENDENT_AMBULATORY_CARE_PROVIDER_SITE_OTHER): Payer: Medicare Other | Admitting: General Practice

## 2016-11-18 DIAGNOSIS — Z23 Encounter for immunization: Secondary | ICD-10-CM

## 2016-11-26 DIAGNOSIS — L0101 Non-bullous impetigo: Secondary | ICD-10-CM | POA: Diagnosis not present

## 2016-12-03 ENCOUNTER — Telehealth: Payer: Self-pay | Admitting: Internal Medicine

## 2016-12-03 NOTE — Telephone Encounter (Signed)
Error

## 2016-12-06 ENCOUNTER — Ambulatory Visit: Payer: Medicare Other | Admitting: Internal Medicine

## 2016-12-06 ENCOUNTER — Encounter: Payer: Self-pay | Admitting: Internal Medicine

## 2016-12-06 DIAGNOSIS — M810 Age-related osteoporosis without current pathological fracture: Secondary | ICD-10-CM | POA: Diagnosis not present

## 2016-12-06 DIAGNOSIS — Z85820 Personal history of malignant melanoma of skin: Secondary | ICD-10-CM

## 2016-12-06 DIAGNOSIS — I10 Essential (primary) hypertension: Secondary | ICD-10-CM

## 2016-12-06 DIAGNOSIS — L570 Actinic keratosis: Secondary | ICD-10-CM | POA: Diagnosis not present

## 2016-12-06 DIAGNOSIS — F5104 Psychophysiologic insomnia: Secondary | ICD-10-CM | POA: Diagnosis not present

## 2016-12-06 MED ORDER — ZOLPIDEM TARTRATE 10 MG PO TABS: 10.0000 mg | ORAL_TABLET | Freq: Every evening | ORAL | 1 refills | Status: DC | PRN

## 2016-12-06 NOTE — Assessment & Plan Note (Signed)
On Ambien 

## 2016-12-06 NOTE — Patient Instructions (Signed)
MC well w/Jill 

## 2016-12-06 NOTE — Assessment & Plan Note (Signed)
L wrist is healing MRSA and AK bx on L wrist by Dr Nicholas LoseLomax - 10/17 - slow healing; MRSA - on Rx F/u bx is pending

## 2016-12-06 NOTE — Progress Notes (Signed)
Subjective:  Patient ID: Selena Taylor, female    DOB: May 04, 1935  Age: 81 y.o. MRN: [id number]  CC: No chief complaint on file.   HPI Selena Taylor presents for MRSA and AK bx on L wrist by Dr Nicholas LoseLomax - 10/17 - slow healing; MRSA - on Rx F/u osteoporosis - has not had Prolia shot yet - wants to take care of the wrist first F/u insomnia  Outpatient Medications Prior to Visit  Medication Sig Dispense Refill  . ALPRAZolam (XANAX) 0.25 MG tablet Take 1 tablet (0.25 mg total) by mouth 2 (two) times daily as needed for anxiety. Must see MD for future refills 60 tablet 0  . Cholecalciferol (VITAMIN D3) 2000 units capsule Take 1 capsule (2,000 Units total) by mouth daily. 100 capsule 3  . diltiazem (CARTIA XT) 240 MG 24 hr capsule Take 1 capsule (240 mg total) by mouth daily. 30 capsule 11  . Grape Seed 100 MG CAPS Take 1 capsule by mouth daily.     . mupirocin ointment (BACTROBAN) 2 % APP TOPICALLY AA ON FOREARM AND COVER WITH BANDAID UTD  0  . nebivolol (BYSTOLIC) 5 MG tablet Take 1 tablet (5 mg total) by mouth daily. 30 tablet 11  . ramipril (ALTACE) 10 MG capsule Take 1 capsule (10 mg total) by mouth daily. 30 capsule 11  . Rivaroxaban (XARELTO) 15 MG TABS tablet Take 1 tablet (15 mg total) by mouth daily with supper. 30 tablet 11  . zolpidem (AMBIEN) 10 MG tablet Take 1 tablet (10 mg total) by mouth at bedtime as needed. 90 tablet 1   No facility-administered medications prior to visit.     ROS Review of Systems  Constitutional: Negative for activity change, appetite change, chills, fatigue and unexpected weight change.  HENT: Negative for congestion, mouth sores and sinus pressure.   Eyes: Negative for visual disturbance.  Respiratory: Negative for cough and chest tightness.   Gastrointestinal: Negative for abdominal pain and nausea.  Genitourinary: Negative for difficulty urinating, frequency and vaginal pain.  Musculoskeletal: Negative for back pain and gait problem.  Skin:  Positive for color change and wound. Negative for pallor and rash.  Neurological: Negative for dizziness, tremors, weakness, numbness and headaches.  Psychiatric/Behavioral: Negative for confusion and sleep disturbance.    Objective:  BP (!) 142/70 (BP Location: Left Arm, Patient Position: Sitting, Cuff Size: Normal)   Pulse 70   Temp 98 F (36.7 C) (Oral)   Ht 5\' 2"  (1.575 m)   Wt 118 lb (53.5 kg)   SpO2 98%   BMI 21.58 kg/m   BP Readings from Last 3 Encounters:  12/06/16 (!) 142/70  08/13/16 124/84  07/27/16 124/80    Wt Readings from Last 3 Encounters:  12/06/16 118 lb (53.5 kg)  08/13/16 118 lb (53.5 kg)  07/27/16 120 lb (54.4 kg)    Physical Exam  Constitutional: She appears well-developed. No distress.  HENT:  Head: Normocephalic.  Right Ear: External ear normal.  Left Ear: External ear normal.  Nose: Nose normal.  Mouth/Throat: Oropharynx is clear and moist.  Eyes: Conjunctivae are normal. Pupils are equal, round, and reactive to light. Right eye exhibits no discharge. Left eye exhibits no discharge.  Neck: Normal range of motion. Neck supple. No JVD present. No tracheal deviation present. No thyromegaly present.  Cardiovascular: Normal rate, regular rhythm and normal heart sounds.  Pulmonary/Chest: No stridor. No respiratory distress. She has no wheezes.  Abdominal: Soft. Bowel sounds are normal. She exhibits  no distension and no mass. There is no tenderness. There is no rebound and no guarding.  Musculoskeletal: She exhibits no edema or tenderness.  Lymphadenopathy:    She has no cervical adenopathy.  Neurological: She displays normal reflexes. No cranial nerve deficit. She exhibits normal muscle tone. Coordination normal.  Skin: No rash noted. No erythema.  Psychiatric: She has a normal mood and affect. Her behavior is normal. Judgment and thought content normal.    Lab Results  Component Value Date   WBC 6.6 07/27/2016   HGB 13.8 07/27/2016   HCT 40.5  07/27/2016   PLT 195 07/27/2016   GLUCOSE 102 (H) 06/16/2016   CHOL 163 10/15/2014   TRIG 93.0 10/15/2014   HDL 79.80 10/15/2014   LDLCALC 65 10/15/2014   ALT 14 06/16/2016   AST 21 06/16/2016   NA 140 06/16/2016   K 4.6 06/16/2016   CL 103 06/16/2016   CREATININE 1.02 (H) 06/16/2016   BUN 17 06/16/2016   CO2 18 06/16/2016   TSH 1.650 06/16/2016   INR 2.03 (H) 02/29/2012   HGBA1C 6.5 01/28/2014    Dexascan  Result Date: 06/22/2016 EXAM: DUAL X-RAY ABSORPTIOMETRY (DXA) FOR BONE MINERAL DENSITY IMPRESSION: Referring Physician:  Tresa Garter PATIENT: Name: Selena Taylor, Selena Taylor Patient ID: [id number] Birth Date: 03-06-1935 Height: 62.0 in. Sex: Female Measured: 06/22/2016 Weight: 118.8 lbs. Indications: Advanced Age, Caucasian, Estrogen Deficient, Hysterectomy, Postmenopausal Fractures: None Treatments: Calcium (E943.0) ASSESSMENT: The BMD measured at Femur Total Right is 0.611 g/cm2 with a T-score of -3.1. This patient is considered osteoporotic according to World Health Organization [facility]) criteria. There has been a statistically significant decrease in BMD of Left hip, and a statistically significant increase in BMD of Lumbar Spine since prior exam dated 03/25/2014. Site Region Measured Date Measured Age YA BMD Significant CHANGE T-score DualFemur Total Right 06[age]22/2016    78.6         -3.1    0.611 g/cm2 AP Spine  L1-L4       8/22/2016    [age]         -2.1    0.935 g/cm2 World Health Organization [facility]) criteria for post-menopausal, Caucasian Women: Normal       T-score at or above -1 SD Osteopenia   T-score between -1 and -2.5 SD Osteoporosis T-score at or below -2.5 SD RECOMMENDATION: National Osteoporosis Foundation recommends that FDA-approved medical therapies be considered in postmenopausal women and men age 17 or older with a: 1. Hip or vertebral (clinical or morphometric) fracture. 2. T-score of <-2.5 at the spine or hip. 3. Ten-year fracture probability by FRAX of 3% or greater for hip  fracture or 20% or greater for major osteoporotic fracture. All treatment decisions require clinical judgment and consideration of individual patient factors, including patient preferences, co-morbidities, previous drug use, risk factors not captured in the FRAX model (e.g. falls, vitamin D deficiency, increased bone turnover, interval significant decline in bone density) and possible under - or over-estimation of fracture risk by FRAX. All patients should ensure an adequate intake of dietary calcium (1200 mg/d) and vitamin D (800 IU daily) unless contraindicated. FOLLOW-UP: People with diagnosed cases of osteoporosis or at high risk for fracture should have regular bone mineral density tests. For patients eligible for Medicare, routine testing is allowed once every 2 years. The testing frequency can be increased to one year for patients who have rapidly progressing disease, those who are receiving or discontinuing medical therapy to restore bone mass, or have additional risk  factors. I have reviewed this report, and agree with the above findings. [facility] Electronically Signed   By: Myles RosenthalJohn  Stahl M.D.   On: 06/22/2016 10:32   Mm Screening Breast Tomo Bilateral  Result Date: 06/22/2016 CLINICAL DATA:  Screening. EXAM: 2D DIGITAL SCREENING BILATERAL MAMMOGRAM WITH CAD AND ADJUNCT TOMO COMPARISON:  Previous exam(s). ACR Breast Density Category c: The breast tissue is heterogeneously dense, which may obscure small masses. FINDINGS: There are no findings suspicious for malignancy. Images were processed with CAD. IMPRESSION: No mammographic evidence of malignancy. A result letter of this screening mammogram will be mailed directly to the patient. RECOMMENDATION: Screening mammogram in one year. (Code:SM-B-01Y) BI-RADS CATEGORY  1: Negative. Electronically Signed   By: Bary RichardStan  Maynard M.D.   On: 06/22/2016 16:59    Assessment & Plan:   There are no diagnoses linked to this encounter. I am having Selena SaltJoyce H.  Wenzlick maintain her Grape Seed, ALPRAZolam, zolpidem, Rivaroxaban, ramipril, nebivolol, diltiazem, Vitamin D3, and mupirocin ointment.  Meds ordered this encounter  Medications  . mupirocin ointment (BACTROBAN) 2 %    Sig: APP TOPICALLY AA ON FOREARM AND COVER WITH BANDAID UTD    Refill:  0     Follow-up: No Follow-up on file.  Sonda PrimesAlex Estell Puccini, MD

## 2016-12-06 NOTE — Assessment & Plan Note (Signed)
Bystolic, Cartia, Altace

## 2016-12-06 NOTE — Assessment & Plan Note (Signed)
Pt has not had Prolia shot yet - wants to take care of the wrist first

## 2016-12-06 NOTE — Assessment & Plan Note (Signed)
F/u w/Dr Lomax 

## 2016-12-20 ENCOUNTER — Telehealth: Payer: Self-pay | Admitting: Internal Medicine

## 2016-12-20 NOTE — Telephone Encounter (Signed)
Pt called asking to speak with you. She can be reached at [phone].

## 2016-12-21 NOTE — Telephone Encounter (Signed)
Patient can schedule nurse visit at her earliest convenience to get prolia injection, patient has already paid copay---and I will be trying to call her back later today

## 2016-12-22 DIAGNOSIS — Z85828 Personal history of other malignant neoplasm of skin: Secondary | ICD-10-CM | POA: Diagnosis not present

## 2016-12-22 DIAGNOSIS — D0462 Carcinoma in situ of skin of left upper limb, including shoulder: Secondary | ICD-10-CM | POA: Diagnosis not present

## 2017-01-21 ENCOUNTER — Ambulatory Visit (INDEPENDENT_AMBULATORY_CARE_PROVIDER_SITE_OTHER): Payer: Medicare Other

## 2017-01-21 DIAGNOSIS — M810 Age-related osteoporosis without current pathological fracture: Secondary | ICD-10-CM | POA: Diagnosis not present

## 2017-01-21 MED ORDER — DENOSUMAB 60 MG/ML ~~LOC~~ SOLN
60.0000 mg | Freq: Once | SUBCUTANEOUS | Status: AC
  Administered 2017-01-21: 60 mg via SUBCUTANEOUS

## 2017-01-27 ENCOUNTER — Ambulatory Visit: Payer: Medicare Other | Admitting: Family Medicine

## 2017-02-02 NOTE — Progress Notes (Signed)
Medical screening examination/treatment/procedure(s) were performed by non-physician practitioner and as supervising physician I was immediately available for consultation/collaboration. I agree with above. Orlandria Kissner, MD  

## 2017-02-11 DIAGNOSIS — Z803 Family history of malignant neoplasm of breast: Secondary | ICD-10-CM | POA: Diagnosis not present

## 2017-02-11 DIAGNOSIS — Z952 Presence of prosthetic heart valve: Secondary | ICD-10-CM | POA: Diagnosis not present

## 2017-02-11 DIAGNOSIS — H2513 Age-related nuclear cataract, bilateral: Secondary | ICD-10-CM | POA: Diagnosis not present

## 2017-02-11 DIAGNOSIS — Z9011 Acquired absence of right breast and nipple: Secondary | ICD-10-CM | POA: Diagnosis not present

## 2017-03-01 DIAGNOSIS — H1013 Acute atopic conjunctivitis, bilateral: Secondary | ICD-10-CM | POA: Diagnosis not present

## 2017-03-07 DIAGNOSIS — H1013 Acute atopic conjunctivitis, bilateral: Secondary | ICD-10-CM | POA: Diagnosis not present

## 2017-03-23 DIAGNOSIS — D485 Neoplasm of uncertain behavior of skin: Secondary | ICD-10-CM | POA: Diagnosis not present

## 2017-03-23 DIAGNOSIS — L570 Actinic keratosis: Secondary | ICD-10-CM | POA: Diagnosis not present

## 2017-03-23 DIAGNOSIS — L820 Inflamed seborrheic keratosis: Secondary | ICD-10-CM | POA: Diagnosis not present

## 2017-03-23 DIAGNOSIS — Z85828 Personal history of other malignant neoplasm of skin: Secondary | ICD-10-CM | POA: Diagnosis not present

## 2017-05-12 DIAGNOSIS — L570 Actinic keratosis: Secondary | ICD-10-CM | POA: Diagnosis not present

## 2017-05-12 DIAGNOSIS — L820 Inflamed seborrheic keratosis: Secondary | ICD-10-CM | POA: Diagnosis not present

## 2017-05-12 DIAGNOSIS — Z85828 Personal history of other malignant neoplasm of skin: Secondary | ICD-10-CM | POA: Diagnosis not present

## 2017-05-30 ENCOUNTER — Other Ambulatory Visit: Payer: Self-pay | Admitting: Internal Medicine

## 2017-05-30 DIAGNOSIS — Z1231 Encounter for screening mammogram for malignant neoplasm of breast: Secondary | ICD-10-CM

## 2017-06-21 ENCOUNTER — Other Ambulatory Visit: Payer: Self-pay | Admitting: Internal Medicine

## 2017-06-21 ENCOUNTER — Telehealth: Payer: Self-pay

## 2017-06-21 ENCOUNTER — Telehealth: Payer: Self-pay | Admitting: Internal Medicine

## 2017-06-21 NOTE — Telephone Encounter (Signed)
**Note De-Identified [name] Obfuscation** I have done a Bystolic PA through covermymeds. I had trouble doing this PA through Covermymeds as I did it X 2 and it never appeared on my "Dashboard". I reached out to covermymeds to see if either of my PA requests went through. I s/w Rebeka who advised me that they did receive both of my requests and that the Bystolic PA has been approved until 06/22/18.  She states that they are having problems with covermymeds at this time but that they are working to correct it.  I then received a call from Ripley at [place] stating that the [place] PA has been approved. I have notified the pts pharmacy, [facility].

## 2017-06-21 NOTE — Telephone Encounter (Signed)
They received a duplicate request for Bystolic  that has already been approved. They will be closing out the other request. Please call if any questions.

## 2017-06-29 ENCOUNTER — Ambulatory Visit
Admission: RE | Admit: 2017-06-29 | Discharge: 2017-06-29 | Disposition: A | Discharge status: Home / Self Care | Payer: Medicare Other | Source: Ambulatory Visit | Attending: Internal Medicine | Admitting: Internal Medicine

## 2017-06-29 DIAGNOSIS — Z1231 Encounter for screening mammogram for malignant neoplasm of breast: Secondary | ICD-10-CM

## 2017-07-04 ENCOUNTER — Other Ambulatory Visit: Payer: Self-pay | Admitting: Internal Medicine

## 2017-07-05 ENCOUNTER — Telehealth: Payer: Self-pay | Admitting: Internal Medicine

## 2017-07-05 NOTE — Telephone Encounter (Signed)
Duplicate. Refill request already routed.

## 2017-07-05 NOTE — Telephone Encounter (Signed)
Copied from CRM [id number]. Topic: Quick Communication - Rx Refill/Question >> Jul 05, 2017  9:06 AM Tamela OddiHarris, Rydan Gulyas J wrote: Medication: zolpidem (AMBIEN) 10 MG tablet  Patient called for refill.  [phone]  Preferred Pharmacy (with phone number or street name): [facility] [id number] - [place], [place] - [address] AT [facility] [address] [phone] (Phone) [phone] (Fax)      Agent: Please be advised that RX refills may take up to 3 business days. We ask that you follow-up with your pharmacy.

## 2017-07-22 ENCOUNTER — Ambulatory Visit: Payer: Medicare Other

## 2017-07-22 ENCOUNTER — Encounter: Payer: Self-pay | Admitting: Internal Medicine

## 2017-07-26 ENCOUNTER — Ambulatory Visit: Payer: Medicare Other | Admitting: *Deleted

## 2017-07-26 DIAGNOSIS — M810 Age-related osteoporosis without current pathological fracture: Secondary | ICD-10-CM

## 2017-07-26 MED ORDER — DENOSUMAB 60 MG/ML ~~LOC~~ SOSY
60.0000 mg | PREFILLED_SYRINGE | Freq: Once | SUBCUTANEOUS | Status: AC
  Administered 2017-07-26: 60 mg via SUBCUTANEOUS

## 2017-08-01 ENCOUNTER — Encounter: Payer: Self-pay | Admitting: Internal Medicine

## 2017-08-01 ENCOUNTER — Ambulatory Visit: Payer: Medicare Other | Admitting: Internal Medicine

## 2017-08-01 VITALS — BP 116/72 | HR 71 | Ht 62.0 in | Wt 122.0 lb

## 2017-08-01 DIAGNOSIS — I482 Chronic atrial fibrillation, unspecified: Secondary | ICD-10-CM

## 2017-08-01 LAB — CBC WITH DIFFERENTIAL/PLATELET
BASOS ABS: 0 10*3/uL (ref 0.0–0.2)
Basos: 1 %
EOS (ABSOLUTE): 0.2 10*3/uL (ref 0.0–0.4)
Eos: 2 %
HEMOGLOBIN: 13.3 g/dL (ref 11.1–15.9)
Hematocrit: 38.9 % (ref 34.0–46.6)
IMMATURE GRANS (ABS): 0 10*3/uL (ref 0.0–0.1)
IMMATURE GRANULOCYTES: 0 %
LYMPHS: 34 %
Lymphocytes Absolute: 2.2 10*3/uL (ref 0.7–3.1)
MCH: 32.8 pg (ref 26.6–33.0)
MCHC: 34.2 g/dL (ref 31.5–35.7)
MCV: 96 fL (ref 79–97)
MONOCYTES: 9 %
Monocytes Absolute: 0.6 10*3/uL (ref 0.1–0.9)
NEUTROS ABS: 3.6 10*3/uL (ref 1.4–7.0)
NEUTROS PCT: 54 %
Platelets: 224 10*3/uL (ref 150–450)
RBC: 4.05 x10E6/uL (ref 3.77–5.28)
RDW: 13.8 % (ref 12.3–15.4)
WBC: 6.5 10*3/uL (ref 3.4–10.8)

## 2017-08-01 LAB — BASIC METABOLIC PANEL
BUN/Creatinine Ratio: 20 (ref 12–28)
BUN: 19 mg/dL (ref 8–27)
CALCIUM: 9.4 mg/dL (ref 8.7–10.3)
CHLORIDE: 104 mmol/L (ref 96–106)
CO2: 22 mmol/L (ref 20–29)
Creatinine, Ser: 0.97 mg/dL (ref 0.57–1.00)
GFR, EST AFRICAN AMERICAN: 64 mL/min/{1.73_m2} (ref >59)
GFR, EST NON AFRICAN AMERICAN: 56 mL/min/{1.73_m2} — AB (ref >59)
Glucose: 95 mg/dL (ref 65–99)
Potassium: 4.5 mmol/L (ref 3.5–5.2)
Sodium: 140 mmol/L (ref 134–144)

## 2017-08-01 MED ORDER — RAMIPRIL 10 MG PO CAPS: 10.0000 mg | ORAL_CAPSULE | Freq: Every day | ORAL | 12 refills | Status: DC

## 2017-08-01 MED ORDER — NEBIVOLOL HCL 5 MG PO TABS: 5.0000 mg | ORAL_TABLET | Freq: Every day | ORAL | 12 refills | Status: DC

## 2017-08-01 MED ORDER — DILTIAZEM HCL ER COATED BEADS 240 MG PO CP24: 240.0000 mg | ORAL_CAPSULE | Freq: Every day | ORAL | 12 refills | Status: DC

## 2017-08-01 MED ORDER — RIVAROXABAN 15 MG PO TABS: 15.0000 mg | ORAL_TABLET | Freq: Every day | ORAL | 12 refills | Status: DC

## 2017-08-01 NOTE — Progress Notes (Signed)
Patient Care Team: Plotnikov, Georgina Quint, MD as PCP - General (Internal Medicine) Duke Salvia, MD as Consulting Physician (Cardiology) Venancio Poisson, MD as Consulting Physician (Dermatology)   HPI  Selena Taylor is a 82 y.o. female Seen in followup for atrial fibrillation and pericarditis. Because of persistent symptoms she had undergone a pericardial window.  She takes Rivaroxaban      Her husband has died currently. [place] Life is much much better.  Date Cr Hgb  6/17 0.89 13.9  5/18 1.02             The patient denies chest pain, shortness of breath, nocturnal dyspnea, orthopnea or peripheral edema.  There have been no palpitations, lightheadedness or syncope.   On Anticoagulation;  No bleeding issues     Past Medical History:  Diagnosis Date  . Anxiety   . CHF (congestive heart failure) (HCC)   . Chronic anticoagulation    on coumadin  . Endometriosis   . HTN (hypertension)   . Insomnia   . Osteopenia   . PAF (paroxysmal atrial fibrillation) (HCC)    has been on Flecainide in the past. Stopped 02/10/11; Remains on coumadin anticoagulation; intol to Rythmol and failed DCCV; noted to be in AFlutter 5/13;  Echo 01/2011:  EF 55-60%, mild MR, mild LAE.  Myoview 6/12:  No ischemia, EF 74%  . Pericardial effusion   . Pericardial effusion 02/23/2012  . Shortness of breath   . TIA (transient ischemic attack)     Past Surgical History:  Procedure Laterality Date  . APPENDECTOMY    . CARDIOVERSION  01/14/2011   Procedure: CARDIOVERSION;  Surgeon: Duke Salvia, MD;  Location: [facility] OR;  Service: Cardiovascular;  Laterality: N/A;  To be completed in Neuro OR 33 time slot 0830 12/20  . CARDIOVERSION  05/27/2011   Procedure: CARDIOVERSION;  Surgeon: Duke Salvia, MD;  Location: [facility] OR;  Service: Cardiovascular;  Laterality: N/A;  . PERICARDIAL WINDOW  02/25/2012   Procedure: PERICARDIAL WINDOW;  Surgeon: Delight Ovens, MD;  Location: [facility] OR;   Service: Thoracic;  Laterality: N/A;  . POLYPECTOMY    . skin cancer removal    . TEE WITHOUT CARDIOVERSION  02/25/2012   Procedure: TRANSESOPHAGEAL ECHOCARDIOGRAM (TEE);  Surgeon: Delight Ovens, MD;  Location: [facility] OR;  Service: Thoracic;  Laterality: N/A;  . TOTAL HYSTERECTOMY AND BILATERAL SALPINGOOPHERECTOMY      Current Outpatient Medications  Medication Sig Dispense Refill  . ALPRAZolam (XANAX) 0.25 MG tablet Take 1 tablet (0.25 mg total) by mouth 2 (two) times daily as needed for anxiety. Must see MD for future refills 60 tablet 0  . Cholecalciferol (VITAMIN D3) 2000 units capsule Take 1 capsule (2,000 Units total) by mouth daily. 100 capsule 3  . denosumab (PROLIA) 60 MG/ML SOSY injection Inject 60 mg into the skin every 6 (six) months.    . diltiazem (CARTIA XT) 240 MG 24 hr capsule Take 1 capsule (240 mg total) by mouth daily. 30 capsule 12  . Grape Seed 100 MG CAPS Take 1 capsule by mouth daily.     . mupirocin ointment (BACTROBAN) 2 % APP TOPICALLY AA ON FOREARM AND COVER WITH BANDAID UTD  0  . nebivolol (BYSTOLIC) 5 MG tablet Take 1 tablet (5 mg total) by mouth daily. 30 tablet 12  . ramipril (ALTACE) 10 MG capsule Take 1 capsule (10 mg total) by mouth daily. 30 capsule 12  .  Rivaroxaban (XARELTO) 15 MG TABS tablet Take 1 tablet (15 mg total) by mouth daily with supper. 30 tablet 12  . zolpidem (AMBIEN) 10 MG tablet TAKE 1 TABLET BY MOUTH AT BEDTIME AS NEEDED 90 tablet 0   No current facility-administered medications for this visit.     Allergies  Allergen Reactions  . Other Other (See Comments)    Novocaine.  REACTION:  Rapid heart rate  . Procaine Hcl     Rapid heart rate    Review of Systems negative except from HPI and PMH  Physical Exam BP 116/72   Pulse 71   Ht 5\' 2"  (1.575 m)   Wt 122 lb (55.3 kg)   SpO2 98%   BMI 22.31 kg/m  Well developed and nourished in no acute distress HENT normal Neck supple with JVP-flat Carotids brisk and full without  bruits Clear Irregularly irregular rate and rhythm with controlled ventricular response, no murmurs or gallops Abd-soft with active BS without hepatomegaly No Clubbing cyanosis edema Skin-warm and dry A & Oriented  Grossly normal sensory and motor function    ECG atrial fibrillation at 71 -[place]/Zero 7/40  Assessment and  Plan  Atrial fibrillation with a Controlled ventricular response   Renal insufficiency   Hypertension    Blood pressures under exceptional control.  We will check her anticoagulation labs today  No symptomatic atrial fibrillation  On Anticoagulation;  No bleeding issues

## 2017-08-01 NOTE — Patient Instructions (Signed)
Medication Instructions:  Your physician recommends that you continue on your current medications as directed. Please refer to the Current Medication list given to you today.   Labwork: You will have labs drawn today: CBC and BMP   Testing/Procedures: None ordered.  Follow-Up: Your physician wants you to follow-up in: One Year with Dr Klein.  You will receive a reminder letter in the mail two months in advance. If you don't receive a letter, please call our office to schedule the follow-up appointment.   Any Other Special Instructions Will Be Listed Below (If Applicable).     If you need a refill on your cardiac medications before your next appointment, please call your pharmacy.  

## 2017-09-13 ENCOUNTER — Other Ambulatory Visit: Payer: Self-pay | Admitting: Internal Medicine

## 2017-09-27 DIAGNOSIS — H25012 Cortical age-related cataract, left eye: Secondary | ICD-10-CM | POA: Diagnosis not present

## 2017-10-05 ENCOUNTER — Telehealth: Payer: Self-pay | Admitting: Internal Medicine

## 2017-10-05 NOTE — Telephone Encounter (Signed)
Copied from CRM [id number]. Topic: Quick Communication - Rx Refill/Question >> Oct 05, 2017 11:39 AM Crist Infante wrote: Medication: zolpidem (AMBIEN) 10 MG tablet 90 day [facility], but I do not see we have received. They advised her to call the office. Pt states she will be out tomorrow  [id number] - [place], [place] - [address] AT [facility] [address] [phone] (Phone) [phone] (Fax)

## 2017-10-05 NOTE — Addendum Note (Signed)
Addended by: Scarlett Presto on: 10/05/2017 04:18 PM   Modules accepted: Orders

## 2017-10-05 NOTE — Telephone Encounter (Signed)
zolpidem refill Last Refill:07/06/17 # 90 Last OV: 12/06/16 PCP: Dr Posey Rea Pharmacy:[facility] [address] [place], [place]

## 2017-10-07 MED ORDER — ZOLPIDEM TARTRATE 10 MG PO TABS: 10.0000 mg | ORAL_TABLET | Freq: Every evening | ORAL | 0 refills | Status: DC | PRN

## 2017-10-07 NOTE — Telephone Encounter (Signed)
Needs ov

## 2017-10-07 NOTE — Telephone Encounter (Signed)
Pt called to follow up on this request.  Pt states she is completely out of her medication.

## 2017-10-10 NOTE — Telephone Encounter (Signed)
Pt made an appointment October 30th and patient states that she will be out of medication by then. Please advise

## 2017-10-11 NOTE — Telephone Encounter (Signed)
Pt needs PA

## 2017-10-12 NOTE — Telephone Encounter (Signed)
Approved today Effective from 10/12/2017 through 10/13/2018.

## 2017-10-12 NOTE — Telephone Encounter (Signed)
Key: [id number]

## 2017-10-13 ENCOUNTER — Telehealth: Payer: Self-pay | Admitting: Internal Medicine

## 2017-10-13 NOTE — Telephone Encounter (Signed)
Pt already informed about approval

## 2017-10-13 NOTE — Telephone Encounter (Signed)
Copied from CRM [id number]. Topic: General - Other >> Oct 13, 2017  8:25 AM Leafy Roobinson, Norma J wrote: Reason for WUX:[id number]:malcolm from [employer] PA dept is calling the patient zolpidem has been approve from 10-12-2017 to 10-13-2018

## 2017-10-17 ENCOUNTER — Ambulatory Visit (INDEPENDENT_AMBULATORY_CARE_PROVIDER_SITE_OTHER): Payer: Medicare Other | Admitting: Emergency Medicine

## 2017-10-17 DIAGNOSIS — Z23 Encounter for immunization: Secondary | ICD-10-CM | POA: Diagnosis not present

## 2017-11-23 ENCOUNTER — Ambulatory Visit (INDEPENDENT_AMBULATORY_CARE_PROVIDER_SITE_OTHER): Payer: Medicare Other | Admitting: Internal Medicine

## 2017-11-23 ENCOUNTER — Encounter: Payer: Self-pay | Admitting: Internal Medicine

## 2017-11-23 DIAGNOSIS — Z0000 Encounter for general adult medical examination without abnormal findings: Secondary | ICD-10-CM

## 2017-11-23 DIAGNOSIS — F5104 Psychophysiologic insomnia: Secondary | ICD-10-CM | POA: Diagnosis not present

## 2017-11-23 DIAGNOSIS — I4891 Unspecified atrial fibrillation: Secondary | ICD-10-CM | POA: Diagnosis not present

## 2017-11-23 MED ORDER — ZOSTER VAC RECOMB ADJUVANTED 50 MCG/0.5ML IM SUSR: 0.5000 mL | Freq: Once | INTRAMUSCULAR | 1 refills | Status: AC

## 2017-11-23 MED ORDER — ZOLPIDEM TARTRATE 10 MG PO TABS: 10.0000 mg | ORAL_TABLET | Freq: Every evening | ORAL | 1 refills | Status: DC | PRN

## 2017-11-23 NOTE — Assessment & Plan Note (Signed)
Zolpidem prn  Potential benefits of a long term benzodiazepines  use as well as potential risks  and complications were explained to the patient and were aknowledged. 

## 2017-11-23 NOTE — Assessment & Plan Note (Signed)

## 2017-11-23 NOTE — Assessment & Plan Note (Signed)
On Xarelto, Bystolic, Cartia 

## 2017-11-23 NOTE — Patient Instructions (Signed)
Health Maintenance for Postmenopausal Women Menopause is a normal process in which your reproductive ability comes to an end. This process happens gradually over a span of months to years, usually between the ages of 22 and 9. Menopause is complete when you have missed 12 consecutive menstrual periods. It is important to talk with your health care provider about some of the most common conditions that affect postmenopausal women, such as heart disease, cancer, and bone loss (osteoporosis). Adopting a healthy lifestyle and getting preventive care can help to promote your health and wellness. Those actions can also lower your chances of developing some of these common conditions. What should I know about menopause? During menopause, you may experience a number of symptoms, such as:  Moderate-to-severe hot flashes.  Night sweats.  Decrease in sex drive.  Mood swings.  Headaches.  Tiredness.  Irritability.  Memory problems.  Insomnia.  Choosing to treat or not to treat menopausal changes is an individual decision that you make with your health care provider. What should I know about hormone replacement therapy and supplements? Hormone therapy products are effective for treating symptoms that are associated with menopause, such as hot flashes and night sweats. Hormone replacement carries certain risks, especially as you become older. If you are thinking about using estrogen or estrogen with progestin treatments, discuss the benefits and risks with your health care provider. What should I know about heart disease and stroke? Heart disease, heart attack, and stroke become more likely as you age. This may be due, in part, to the hormonal changes that your body experiences during menopause. These can affect how your body processes dietary fats, triglycerides, and cholesterol. Heart attack and stroke are both medical emergencies. There are many things that you can do to help prevent heart disease  and stroke:  Have your blood pressure checked at least every 1-2 years. High blood pressure causes heart disease and increases the risk of stroke.  If you are 80-80 years old, ask your health care provider if you should take aspirin to prevent a heart attack or a stroke.  Do not use any tobacco products, including cigarettes, chewing tobacco, or electronic cigarettes. If you need help quitting, ask your health care provider.  It is important to eat a healthy diet and maintain a healthy weight. ? Be sure to include plenty of vegetables, fruits, low-fat dairy products, and lean protein. ? Avoid eating foods that are high in solid fats, added sugars, or salt (sodium).  Get regular exercise. This is one of the most important things that you can do for your health. ? Try to exercise for at least 150 minutes each week. The type of exercise that you do should increase your heart rate and make you sweat. This is known as moderate-intensity exercise. ? Try to do strengthening exercises at least twice each week. Do these in addition to the moderate-intensity exercise.  Know your numbers.Ask your health care provider to check your cholesterol and your blood glucose. Continue to have your blood tested as directed by your health care provider.  What should I know about cancer screening? There are several types of cancer. Take the following steps to reduce your risk and to catch any cancer development as early as possible. Breast Cancer  Practice breast self-awareness. ? This means understanding how your breasts normally appear and feel. ? It also means doing regular breast self-exams. Let your health care provider know about any changes, no matter how small.  If you are 80  or older, have a clinician do a breast exam (clinical breast exam or CBE) every year. Depending on your age, family history, and medical history, it may be recommended that you also have a yearly breast X-ray (mammogram).  If you  have a family history of breast cancer, talk with your health care provider about genetic screening.  If you are at high risk for breast cancer, talk with your health care provider about having an MRI and a mammogram every year.  Breast cancer (BRCA) gene test is recommended for women who have family members with BRCA-related cancers. Results of the assessment will determine the need for genetic counseling and BRCA1 and for BRCA2 testing. BRCA-related cancers include these types: ? Breast. This occurs in males or females. ? Ovarian. ? Tubal. This may also be called fallopian tube cancer. ? Cancer of the abdominal or pelvic lining (peritoneal cancer). ? Prostate. ? Pancreatic.  Cervical, Uterine, and Ovarian Cancer Your health care provider may recommend that you be screened regularly for cancer of the pelvic organs. These include your ovaries, uterus, and vagina. This screening involves a pelvic exam, which includes checking for microscopic changes to the surface of your cervix (Pap test).  For women ages 21-65, health care providers may recommend a pelvic exam and a Pap test every three years. For women ages 80-80, they may recommend the Pap test and pelvic exam, combined with testing for human papilloma virus (HPV), every five years. Some types of HPV increase your risk of cervical cancer. Testing for HPV may also be done on women of any age who have unclear Pap test results.  Other health care providers may not recommend any screening for nonpregnant women who are considered low risk for pelvic cancer and have no symptoms. Ask your health care provider if a screening pelvic exam is right for you.  If you have had past treatment for cervical cancer or a condition that could lead to cancer, you need Pap tests and screening for cancer for at least 20 years after your treatment. If Pap tests have been discontinued for you, your risk factors (such as having a new sexual partner) need to be  reassessed to determine if you should start having screenings again. Some women have medical problems that increase the chance of getting cervical cancer. In these cases, your health care provider may recommend that you have screening and Pap tests more often.  If you have a family history of uterine cancer or ovarian cancer, talk with your health care provider about genetic screening.  If you have vaginal bleeding after reaching menopause, tell your health care provider.  There are currently no reliable tests available to screen for ovarian cancer.  Lung Cancer Lung cancer screening is recommended for adults 69-62 years old who are at high risk for lung cancer because of a history of smoking. A yearly low-dose CT scan of the lungs is recommended if you:  Currently smoke.  Have a history of at least 30 pack-years of smoking and you currently smoke or have quit within the past 15 years. A pack-year is smoking an average of one pack of cigarettes per day for one year.  Yearly screening should:  Continue until it has been 15 years since you quit.  Stop if you develop a health problem that would prevent you from having lung cancer treatment.  Colorectal Cancer  This type of cancer can be detected and can often be prevented.  Routine colorectal cancer screening usually begins at  age 42 and continues through age 45.  If you have risk factors for colon cancer, your health care provider may recommend that you be screened at an earlier age.  If you have a family history of colorectal cancer, talk with your health care provider about genetic screening.  Your health care provider may also recommend using home test kits to check for hidden blood in your stool.  A small camera at the end of a tube can be used to examine your colon directly (sigmoidoscopy or colonoscopy). This is done to check for the earliest forms of colorectal cancer.  Direct examination of the colon should be repeated every  5-10 years until age 71. However, if early forms of precancerous polyps or small growths are found or if you have a family history or genetic risk for colorectal cancer, you may need to be screened more often.  Skin Cancer  Check your skin from head to toe regularly.  Monitor any moles. Be sure to tell your health care provider: ? About any new moles or changes in moles, especially if there is a change in a mole's shape or color. ? If you have a mole that is larger than the size of a pencil eraser.  If any of your family members has a history of skin cancer, especially at a young age, talk with your health care provider about genetic screening.  Always use sunscreen. Apply sunscreen liberally and repeatedly throughout the day.  Whenever you are outside, protect yourself by wearing long sleeves, pants, a wide-brimmed hat, and sunglasses.  What should I know about osteoporosis? Osteoporosis is a condition in which bone destruction happens more quickly than new bone creation. After menopause, you may be at an increased risk for osteoporosis. To help prevent osteoporosis or the bone fractures that can happen because of osteoporosis, the following is recommended:  If you are 46-71 years old, get at least 1,000 mg of calcium and at least 600 mg of vitamin D per day.  If you are older than age 55 but younger than age 65, get at least 1,200 mg of calcium and at least 600 mg of vitamin D per day.  If you are older than age 54, get at least 1,200 mg of calcium and at least 800 mg of vitamin D per day.  Smoking and excessive alcohol intake increase the risk of osteoporosis. Eat foods that are rich in calcium and vitamin D, and do weight-bearing exercises several times each week as directed by your health care provider. What should I know about how menopause affects my mental health? Depression may occur at any age, but it is more common as you become older. Common symptoms of depression  include:  Low or sad mood.  Changes in sleep patterns.  Changes in appetite or eating patterns.  Feeling an overall lack of motivation or enjoyment of activities that you previously enjoyed.  Frequent crying spells.  Talk with your health care provider if you think that you are experiencing depression. What should I know about immunizations? It is important that you get and maintain your immunizations. These include:  Tetanus, diphtheria, and pertussis (Tdap) booster vaccine.  Influenza every year before the flu season begins.  Pneumonia vaccine.  Shingles vaccine.  Your health care provider may also recommend other immunizations. This information is not intended to replace advice given to you by your health care provider. Make sure you discuss any questions you have with your health care provider. Document Released: 03/05/2005  Document Revised: 08/01/2015 Document Reviewed: 10/15/2014 Elsevier Interactive Patient Education  2018 [employer]  

## 2017-11-23 NOTE — Progress Notes (Signed)
Subjective:  Patient ID: Selena Taylor, female    DOB: Jun 30, 1935  Age: 82 y.o. MRN: [id number]  CC: No chief complaint on file.   HPI SAVHANNA SLIVA presents for osteoporosis, HTN, insomnia f/u Well exam  Outpatient Medications Prior to Visit  Medication Sig Dispense Refill  . Cholecalciferol (VITAMIN D3) 2000 units capsule Take 1 capsule (2,000 Units total) by mouth daily. 100 capsule 3  . denosumab (PROLIA) 60 MG/ML SOSY injection Inject 60 mg into the skin every 6 (six) months.    . diltiazem (CARTIA XT) 240 MG 24 hr capsule Take 1 capsule (240 mg total) by mouth daily. 90 capsule 3  . nebivolol (BYSTOLIC) 5 MG tablet Take 1 tablet (5 mg total) by mouth daily. 30 tablet 12  . ramipril (ALTACE) 10 MG capsule Take 1 capsule (10 mg total) by mouth daily. 30 capsule 12  . Rivaroxaban (XARELTO) 15 MG TABS tablet Take 1 tablet (15 mg total) by mouth daily with supper. 30 tablet 12  . zolpidem (AMBIEN) 10 MG tablet Take 1 tablet (10 mg total) by mouth at bedtime as needed. Patient needs office visit before refills will be given 90 tablet 0  . ALPRAZolam (XANAX) 0.25 MG tablet Take 1 tablet (0.25 mg total) by mouth 2 (two) times daily as needed for anxiety. Must see MD for future refills 60 tablet 0  . Grape Seed 100 MG CAPS Take 1 capsule by mouth daily.     . mupirocin ointment (BACTROBAN) 2 % APP TOPICALLY AA ON FOREARM AND COVER WITH BANDAID UTD  0   No facility-administered medications prior to visit.     ROS: Review of Systems  Constitutional: Negative for activity change, appetite change, chills, fatigue and unexpected weight change.  HENT: Negative for congestion, mouth sores and sinus pressure.   Eyes: Negative for visual disturbance.  Respiratory: Negative for cough and chest tightness.   Gastrointestinal: Negative for abdominal pain and nausea.  Genitourinary: Negative for difficulty urinating, frequency and vaginal pain.  Musculoskeletal: Positive for arthralgias and back  pain. Negative for gait problem.  Skin: Negative for pallor and rash.  Neurological: Negative for dizziness, tremors, weakness, numbness and headaches.  Psychiatric/Behavioral: Positive for sleep disturbance. Negative for confusion and suicidal ideas.    Objective:  There were no vitals taken for this visit.  BP Readings from Last 3 Encounters:  08/01/17 116/72  12/06/16 (!) 142/70  08/13/16 124/84    Wt Readings from Last 3 Encounters:  08/01/17 122 lb (55.3 kg)  12/06/16 118 lb (53.5 kg)  08/13/16 118 lb (53.5 kg)    Physical Exam  Constitutional: She appears well-developed. No distress.  HENT:  Head: Normocephalic.  Right Ear: External ear normal.  Left Ear: External ear normal.  Nose: Nose normal.  Mouth/Throat: Oropharynx is clear and moist.  Eyes: Pupils are equal, round, and reactive to light. Conjunctivae are normal. Right eye exhibits no discharge. Left eye exhibits no discharge.  Neck: Normal range of motion. Neck supple. No JVD present. No tracheal deviation present. No thyromegaly present.  Cardiovascular: Normal rate, regular rhythm and normal heart sounds.  Pulmonary/Chest: No stridor. No respiratory distress. She has no wheezes.  Abdominal: Soft. Bowel sounds are normal. She exhibits no distension and no mass. There is no tenderness. There is no rebound and no guarding.  Musculoskeletal: She exhibits no edema or tenderness.  Lymphadenopathy:    She has no cervical adenopathy.  Neurological: She displays normal reflexes. No cranial nerve deficit.  She exhibits normal muscle tone. Coordination normal.  Skin: No rash noted. No erythema.  Psychiatric: She has a normal mood and affect. Her behavior is normal. Judgment and thought content normal.  AKs on face  Lab Results  Component Value Date   WBC 6.5 08/01/2017   HGB 13.3 08/01/2017   HCT 38.9 08/01/2017   PLT 224 08/01/2017   GLUCOSE 95 08/01/2017   CHOL 163 10/15/2014   TRIG 93.0 10/15/2014   HDL 79.80  10/15/2014   LDLCALC 65 10/15/2014   ALT 14 06/16/2016   AST 21 06/16/2016   NA 140 08/01/2017   K 4.5 08/01/2017   CL 104 08/01/2017   CREATININE 0.97 08/01/2017   BUN 19 08/01/2017   CO2 22 08/01/2017   TSH 1.650 06/16/2016   INR 2.03 (H) 02/29/2012   HGBA1C 6.5 01/28/2014    Mm 3d Screen Breast Bilateral  Result Date: 06/29/2017 CLINICAL DATA:  Screening. EXAM: DIGITAL SCREENING BILATERAL MAMMOGRAM WITH TOMO AND CAD COMPARISON:  Previous exam(s). ACR Breast Density Category c: The breast tissue is heterogeneously dense, which may obscure small masses. FINDINGS: There are no findings suspicious for malignancy. Images were processed with CAD. IMPRESSION: No mammographic evidence of malignancy. A result letter of this screening mammogram will be mailed directly to the patient. RECOMMENDATION: Screening mammogram in one year. (Code:SM-B-01Y) BI-RADS CATEGORY  1: Negative. Electronically Signed   By: Gerome Sam III M.D   On: 06/29/2017 14:18    Assessment & Plan:   There are no diagnoses linked to this encounter.   No orders of the defined types were placed in this encounter.    Follow-up: No follow-ups on file.  Sonda Primes, MD

## 2017-12-01 DIAGNOSIS — Z85828 Personal history of other malignant neoplasm of skin: Secondary | ICD-10-CM | POA: Diagnosis not present

## 2017-12-01 DIAGNOSIS — L570 Actinic keratosis: Secondary | ICD-10-CM | POA: Diagnosis not present

## 2017-12-01 DIAGNOSIS — L821 Other seborrheic keratosis: Secondary | ICD-10-CM | POA: Diagnosis not present

## 2017-12-01 DIAGNOSIS — L308 Other specified dermatitis: Secondary | ICD-10-CM | POA: Diagnosis not present

## 2017-12-19 ENCOUNTER — Other Ambulatory Visit: Payer: Medicare Other

## 2017-12-19 ENCOUNTER — Telehealth: Payer: Self-pay | Admitting: Internal Medicine

## 2017-12-19 NOTE — Telephone Encounter (Signed)
Spoke with pt and explained Dr Graciela HusbandsKlein would not be able to write the orders for the labs Dr Posey ReaPlotnikov wants drawn. I explained there are not medications or indications for Dr Graciela HusbandsKlein to order these labs based off her last OV assessment and medications. Pt verbalized understanding and had no additional questions.

## 2017-12-19 NOTE — Telephone Encounter (Signed)
New Message   Pt states that Dr. Posey ReaPlotnikov wants her to have to have labs and the orders are in but we told her she cant have them done here so she wants to know if Graciela HusbandsKlein can give permission to have her labs because states other people have a problem getting her veins. Please call

## 2018-01-16 DIAGNOSIS — Z85828 Personal history of other malignant neoplasm of skin: Secondary | ICD-10-CM | POA: Diagnosis not present

## 2018-01-16 DIAGNOSIS — D0462 Carcinoma in situ of skin of left upper limb, including shoulder: Secondary | ICD-10-CM | POA: Diagnosis not present

## 2018-01-16 DIAGNOSIS — L821 Other seborrheic keratosis: Secondary | ICD-10-CM | POA: Diagnosis not present

## 2018-01-16 DIAGNOSIS — D485 Neoplasm of uncertain behavior of skin: Secondary | ICD-10-CM | POA: Diagnosis not present

## 2018-01-16 DIAGNOSIS — D692 Other nonthrombocytopenic purpura: Secondary | ICD-10-CM | POA: Diagnosis not present

## 2018-01-16 DIAGNOSIS — D0439 Carcinoma in situ of skin of other parts of face: Secondary | ICD-10-CM | POA: Diagnosis not present

## 2018-01-23 DIAGNOSIS — L570 Actinic keratosis: Secondary | ICD-10-CM | POA: Diagnosis not present

## 2018-02-10 DIAGNOSIS — L820 Inflamed seborrheic keratosis: Secondary | ICD-10-CM | POA: Diagnosis not present

## 2018-02-10 DIAGNOSIS — Z85828 Personal history of other malignant neoplasm of skin: Secondary | ICD-10-CM | POA: Diagnosis not present

## 2018-02-10 DIAGNOSIS — L570 Actinic keratosis: Secondary | ICD-10-CM | POA: Diagnosis not present

## 2018-02-10 DIAGNOSIS — L814 Other melanin hyperpigmentation: Secondary | ICD-10-CM | POA: Diagnosis not present

## 2018-02-14 ENCOUNTER — Ambulatory Visit: Payer: Medicare Other

## 2018-02-17 ENCOUNTER — Ambulatory Visit (INDEPENDENT_AMBULATORY_CARE_PROVIDER_SITE_OTHER)
Admission: RE | Admit: 2018-02-17 | Discharge: 2018-02-17 | Disposition: A | Discharge status: Home / Self Care | Payer: Medicare Other | Source: Ambulatory Visit | Attending: Family | Admitting: Family

## 2018-02-17 ENCOUNTER — Encounter: Payer: Self-pay | Admitting: Family

## 2018-02-17 ENCOUNTER — Ambulatory Visit: Payer: Self-pay | Admitting: *Deleted

## 2018-02-17 ENCOUNTER — Ambulatory Visit (INDEPENDENT_AMBULATORY_CARE_PROVIDER_SITE_OTHER): Payer: Medicare Other | Admitting: Family

## 2018-02-17 VITALS — BP 116/74 | HR 81 | Temp 97.9°F | Ht 62.0 in | Wt 118.0 lb

## 2018-02-17 DIAGNOSIS — M545 Low back pain, unspecified: Secondary | ICD-10-CM

## 2018-02-17 DIAGNOSIS — G8929 Other chronic pain: Secondary | ICD-10-CM | POA: Diagnosis not present

## 2018-02-17 DIAGNOSIS — J209 Acute bronchitis, unspecified: Secondary | ICD-10-CM | POA: Diagnosis not present

## 2018-02-17 IMAGING — DX DG LUMBAR SPINE 2-3V
3 series · 3 of 3 positions shown · non-contrast
Comparison: [DATE]

CLINICAL DATA: Low back pain

EXAM:
LUMBAR SPINE - 2-3 VIEW

[l-spine ap]
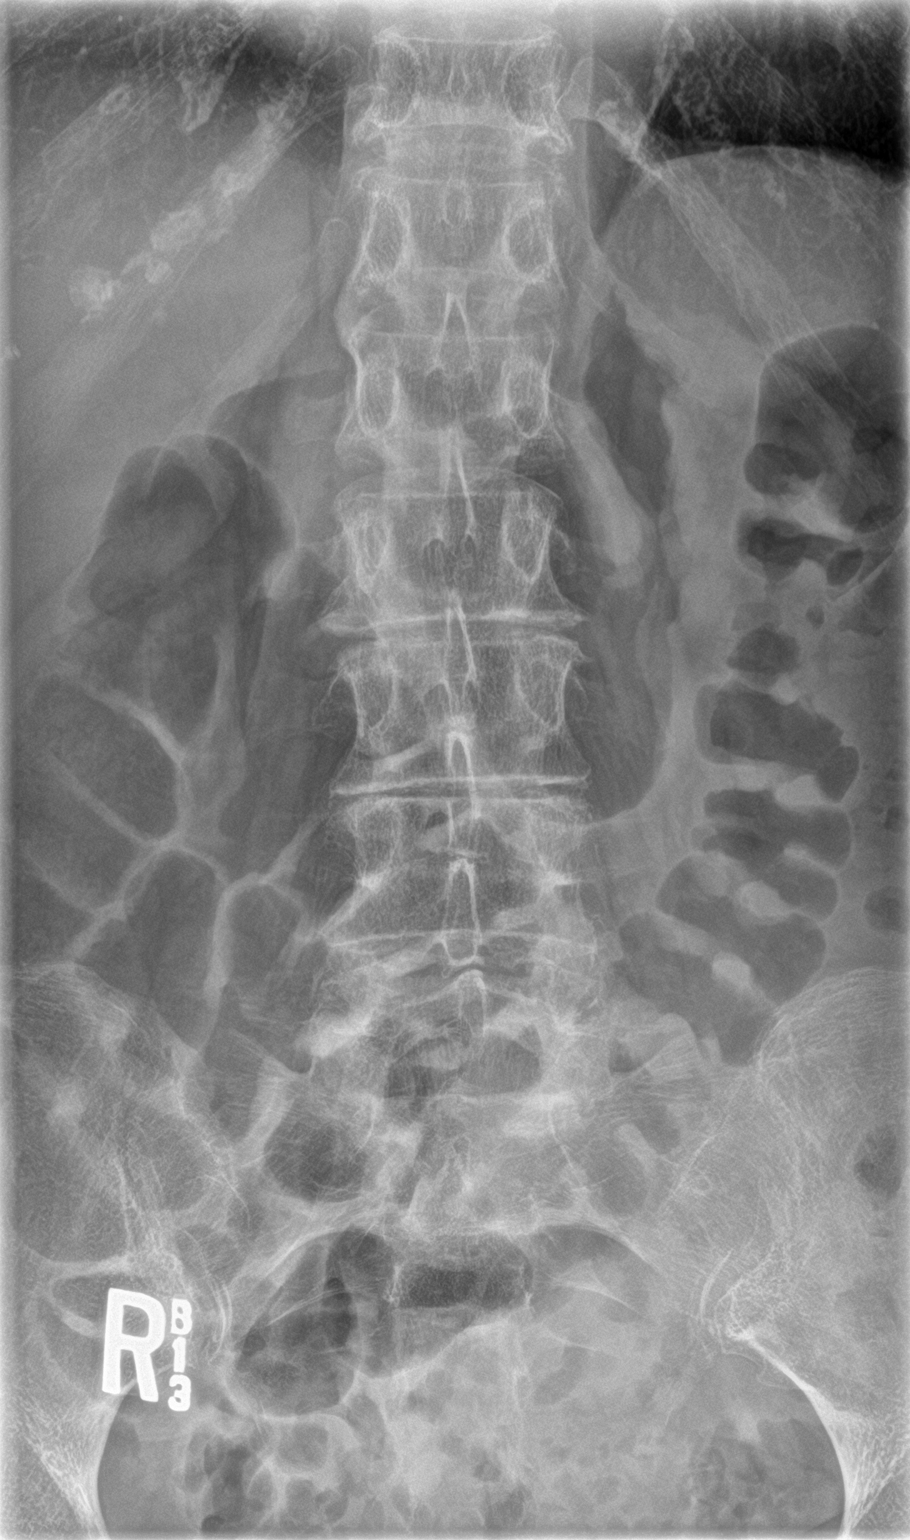

[l-spine lat]
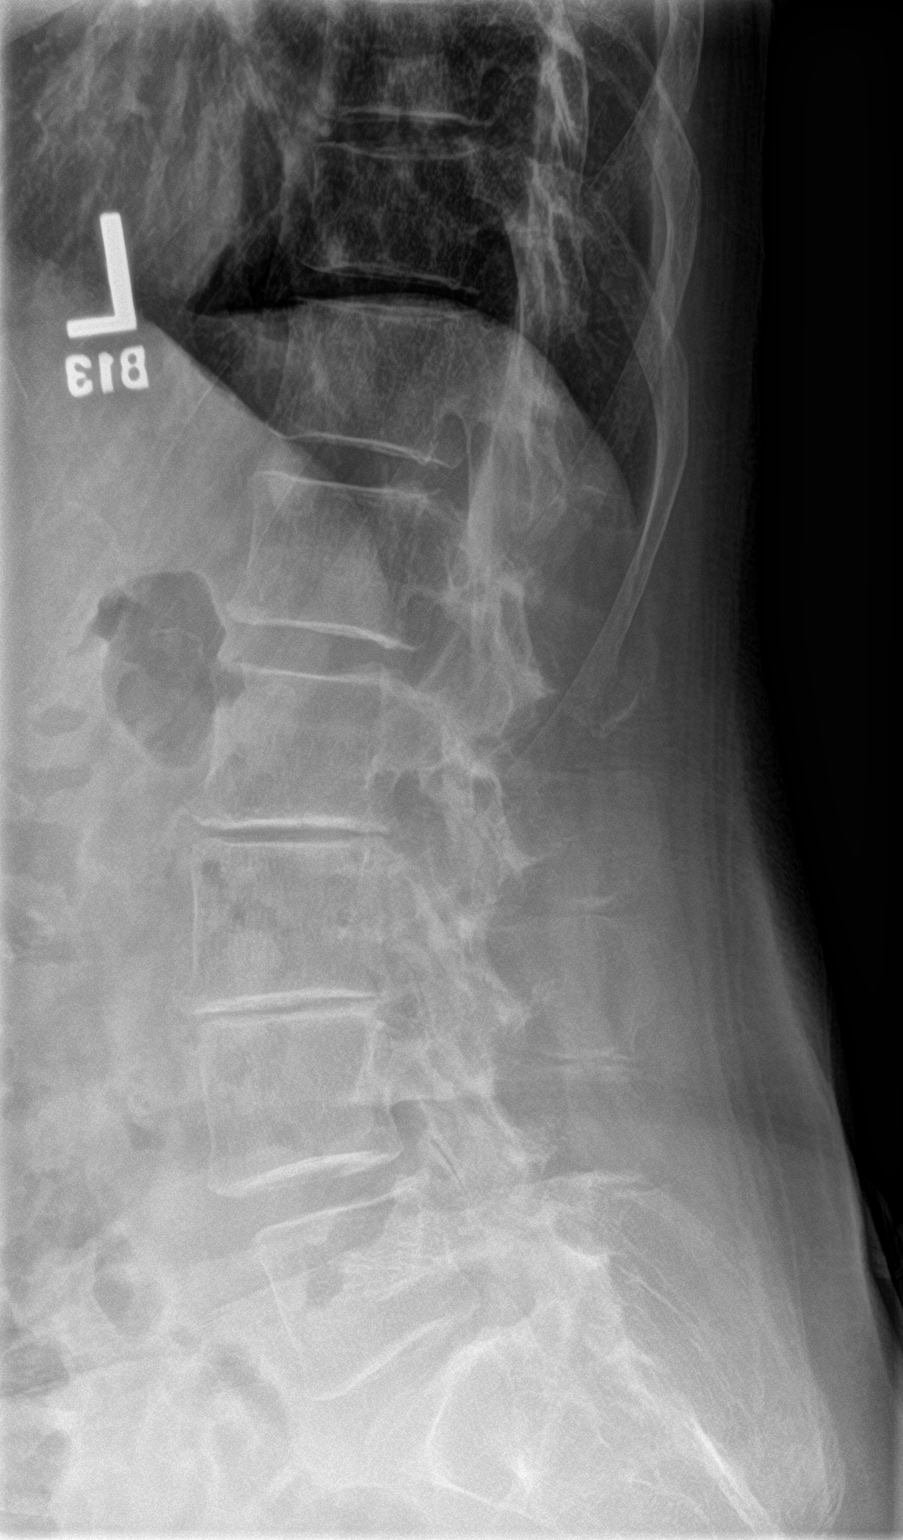

[l-spine spot]
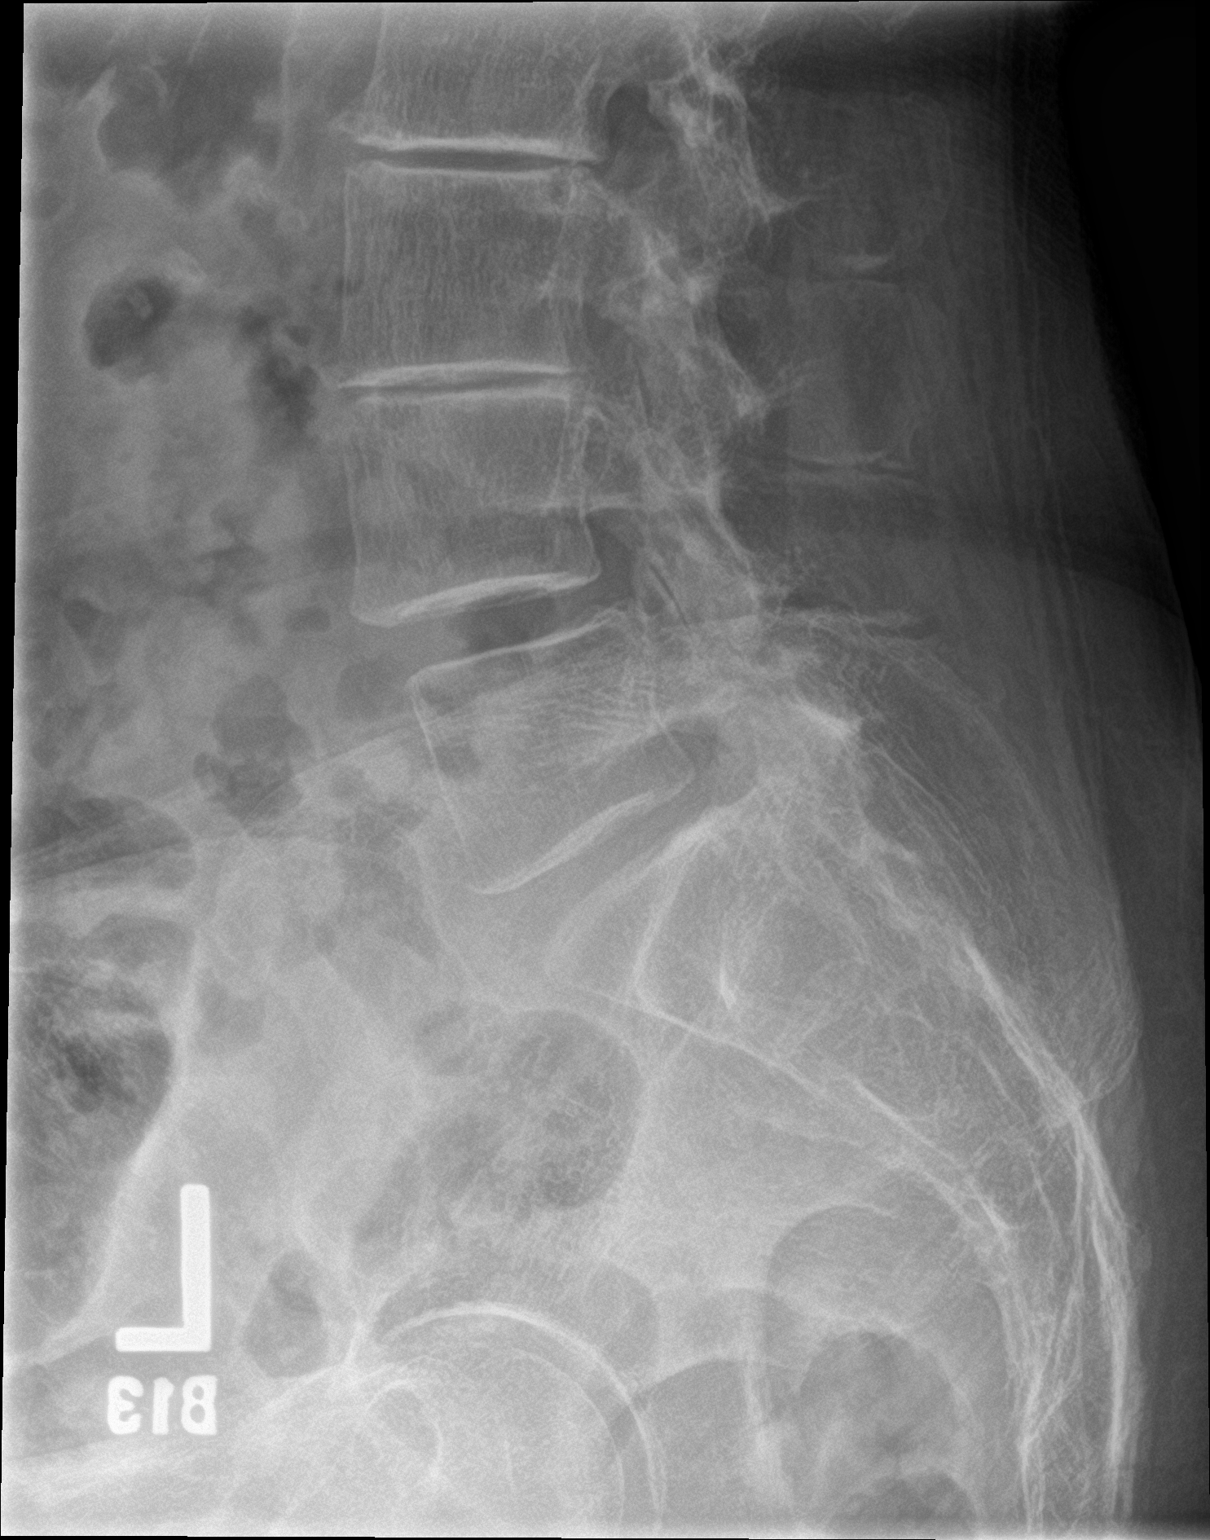

[3 of 3 positions shown; findings below may reference images not displayed]

FINDINGS: Grade 1 anterolisthesis L4 on L5. Vertebral body heights are normal.
Moderate-to-marked degenerative change L2-L3 and L3-L4 with mild
degenerative change at L5-S1.
IMPRESSION: 1. No acute osseous abnormality
2. Grade 1 anterolisthesis L4 on L5. Multiple level degenerative
changes, most marked at L2-L3 and L3-L4.

## 2018-02-17 MED ORDER — PREDNISONE 20 MG PO TABS: 20.0000 mg | ORAL_TABLET | Freq: Every day | ORAL | 0 refills | Status: DC

## 2018-02-17 MED ORDER — CEFDINIR 300 MG PO CAPS: 300.0000 mg | ORAL_CAPSULE | Freq: Two times a day (BID) | ORAL | 0 refills | Status: DC

## 2018-02-17 MED ORDER — PROMETHAZINE-DM 6.25-15 MG/5ML PO SYRP: 5.0000 mL | ORAL_SOLUTION | Freq: Four times a day (QID) | ORAL | 0 refills | Status: DC | PRN

## 2018-02-17 NOTE — Progress Notes (Signed)
Selena Taylor is a 83 y.o. female with the following history as recorded in [employer]:  Patient Active Problem List   Diagnosis Date Noted  . Actinic keratoses 12/06/2016  . History of melanoma 12/06/2016  . Osteoporosis 08/13/2016  . Well adult exam 10/07/2014  . Elevated glucose 01/23/2014  . Acute pericarditis, unspecified 07/02/2013  . Fatigue 07/02/2013  . Tremor 04/04/2012  . Chronic anticoagulation   . Myalgia 02/05/2012  . TIA (transient ischemic attack) 03/16/2010  . Insomnia 11/03/2006  . Essential hypertension 11/03/2006  . Atrial fibrillation (HCC) 11/03/2006    Current Outpatient Medications  Medication Sig Dispense Refill  . Cholecalciferol (VITAMIN D3) 2000 units capsule Take 1 capsule (2,000 Units total) by mouth daily. 100 capsule 3  . denosumab (PROLIA) 60 MG/ML SOSY injection Inject 60 mg into the skin every 6 (six) months.    . diltiazem (CARTIA XT) 240 MG 24 hr capsule Take 1 capsule (240 mg total) by mouth daily. 90 capsule 3  . nebivolol (BYSTOLIC) 5 MG tablet Take 1 tablet (5 mg total) by mouth daily. 30 tablet 12  . ramipril (ALTACE) 10 MG capsule Take 1 capsule (10 mg total) by mouth daily. 30 capsule 12  . Rivaroxaban (XARELTO) 15 MG TABS tablet Take 1 tablet (15 mg total) by mouth daily with supper. 30 tablet 12  . zolpidem (AMBIEN) 10 MG tablet Take 1 tablet (10 mg total) by mouth at bedtime as needed. Patient needs office visit before refills will be given 90 tablet 1  . cefdinir (OMNICEF) 300 MG capsule Take 1 capsule (300 mg total) by mouth 2 (two) times daily. 20 capsule 0  . predniSONE (DELTASONE) 20 MG tablet Take 1 tablet (20 mg total) by mouth daily with breakfast. 5 tablet 0  . promethazine-dextromethorphan (PROMETHAZINE-DM) 6.25-15 MG/5ML syrup Take 5 mLs by mouth 4 (four) times daily as needed for cough. 118 mL 0   No current facility-administered medications for this visit.     Allergies: Other and Procaine hcl  Past Medical History:   Diagnosis Date  . Anxiety   . CHF (congestive heart failure) (HCC)   . Chronic anticoagulation    on coumadin  . Endometriosis   . HTN (hypertension)   . Insomnia   . Osteopenia   . PAF (paroxysmal atrial fibrillation) (HCC)    has been on Flecainide in the past. Stopped 02/10/11; Remains on coumadin anticoagulation; intol to Rythmol and failed DCCV; noted to be in AFlutter 5/13;  Echo 01/2011:  EF 55-60%, mild MR, mild LAE.  Myoview 6/12:  No ischemia, EF 74%  . Pericardial effusion   . Pericardial effusion 02/23/2012  . Shortness of breath   . TIA (transient ischemic attack)     Past Surgical History:  Procedure Laterality Date  . APPENDECTOMY    . CARDIOVERSION  01/14/2011   Procedure: CARDIOVERSION;  Surgeon: Duke Salvia, MD;  Location: [facility] OR;  Service: Cardiovascular;  Laterality: N/A;  To be completed in Neuro OR 33 time slot 0830 12/20  . CARDIOVERSION  05/27/2011   Procedure: CARDIOVERSION;  Surgeon: Duke Salvia, MD;  Location:  [facility] OR;  Service: Cardiovascular;  Laterality: N/A;  . PERICARDIAL WINDOW  02/25/2012   Procedure: PERICARDIAL WINDOW;  Surgeon: Delight Ovens, MD;  Location: [facility] OR;  Service: Thoracic;  Laterality: N/A;  . POLYPECTOMY    . skin cancer removal    . TEE WITHOUT CARDIOVERSION  02/25/2012   Procedure: TRANSESOPHAGEAL ECHOCARDIOGRAM (TEE);  Surgeon: Gwenith Daily  Tyrone Sage, MD;  Location: [facility] OR;  Service: Thoracic;  Laterality: N/A;  . TOTAL HYSTERECTOMY AND BILATERAL SALPINGOOPHERECTOMY      Family History  Problem Relation Age of Onset  . Cancer Brother        myeloma  . Heart disease Unknown        Father age 53    Social History   Tobacco Use  . Smoking status: Never Smoker  . Smokeless tobacco: Never Used  Substance Use Topics  . Alcohol use: Yes    Alcohol/week: 14.0 standard drinks    Types: 14 Glasses of wine per week    Comment: 1-2 glasses of wine nightly    Subjective:  2 issues:  1) 3 month history of "excruciating back pain"-  no known injury or trauma; no radiating symptoms; has been using heating pad with some relief; history of osteoporosis- using Prolia; no definite triggers for the back pain;  2) 1 week history of cough/ cold- feels like "congestion deep in my chest." + barking cough; not prone to bronchitis or pneumonia; no fever, no night sweats; not using any OTC medications;    Objective:  Vitals:   02/17/18 1427  BP: 116/74  Pulse: 81  Temp: 97.9 F (36.6 C)  TempSrc: Oral  SpO2: 96%  Weight: 118 lb 0.6 oz (53.5 kg)  Height: 5\' 2"  (1.575 m)    General: Well developed, well nourished, in no acute distress  Skin : Warm and dry.  Head: Normocephalic and atraumatic  Eyes: Sclera and conjunctiva clear; pupils round and reactive to light; extraocular movements intact  Ears: External normal; canals clear; tympanic membranes normal  Oropharynx: Pink, supple. No suspicious lesions  Neck: Supple without thyromegaly, adenopathy  Lungs: Respirations unlabored; wheezing noted in all 4 lobes CVS exam: normal rate and regular rhythm.  Musculoskeletal: No deformities; no active joint inflammation  Extremities: No edema, cyanosis, clubbing  Vessels: Symmetric bilaterally  Neurologic: Alert and oriented; speech intact; face symmetrical; moves all extremities well; CNII-XII intact without focal deficit   Assessment:  1. Chronic bilateral low back pain without sciatica   2. Acute bronchitis, unspecified organism     Plan:  1. Update lumbar X-ray today; she understands that prednisone being used for wheezing should help back as well; follow-up to be determined. 2. Rx for Omnicef 300 mg bid x 10 days, Prednisone 20 mg qd x 5 days, Promethazine DM cough syrup; increase fluids, rest and follow-up worse, no better.   No follow-ups on file.  Orders Placed This Encounter  Procedures  . DG Lumbar Spine 2-3 Views    Standing Status:   Future    Number of Occurrences:   1    Standing Expiration Date:   04/18/2019     Order Specific Question:   Reason for Exam (SYMPTOM  OR DIAGNOSIS REQUIRED)    Answer:   low back pain x 3 months    Order Specific Question:   Preferred imaging location?    Answer:   [facility]    Order Specific Question:   Radiology Contrast Protocol - do NOT remove file path    Answer:   [email or web address]    Requested Prescriptions   Signed Prescriptions Disp Refills  . predniSONE (DELTASONE) 20 MG tablet 5 tablet 0    Sig: Take 1 tablet (20 mg total) by mouth daily with breakfast.  . cefdinir (OMNICEF) 300 MG capsule 20 capsule 0    Sig: Take 1 capsule (300 mg total) by  mouth 2 (two) times daily.  . promethazine-dextromethorphan (PROMETHAZINE-DM) 6.25-15 MG/5ML syrup 118 mL 0    Sig: Take 5 mLs by mouth 4 (four) times daily as needed for cough.

## 2018-02-17 NOTE — Telephone Encounter (Signed)
Summary: cough   Pt called and stated that she has a cough and is to sick to come into office. Pt would like something called in. Please advise      Patient has nonproductive cough for 1 week now and it is not getting better- patient is requesting medication to help her with it- she is afraid to take OTC treatment because of all the medication she takes. Appointment scheduled for evaluation of cough Reason for Disposition . [1] Continuous (nonstop) coughing interferes with work or school AND [2] no improvement using cough treatment per protocol  Answer Assessment - Initial Assessment Questions 1. ONSET: "When did the cough begin?"      Over 1 week 2. SEVERITY: "How bad is the cough today?"      Keep up at night- coughs all day as well- causing back pain 3. RESPIRATORY DISTRESS: "Describe your breathing."      Not effecting breathing 4. FEVER: "Do you have a fever?" If so, ask: "What is your temperature, how was it measured, and when did it start?"     No fever 5. HEMOPTYSIS: "Are you coughing up any blood?" If so ask: "How much?" (flecks, streaks, tablespoons, etc.)     no 6. TREATMENT: "What have you done so far to treat the cough?" (e.g., meds, fluids, humidifier)     No- patient has tried to increase fluids- she is afraid to take anything due to all the medications she takes 7. CARDIAC HISTORY: "Do you have any history of heart disease?" (e.g., heart attack, congestive heart failure)      Atrial fib 8. LUNG HISTORY: "Do you have any history of lung disease?"  (e.g., pulmonary embolus, asthma, emphysema)     no 9. PE RISK FACTORS: "Do you have a history of blood clots?" (or: recent major surgery, recent prolonged travel, bedridden)     no 10. OTHER SYMPTOMS: "Do you have any other symptoms? (e.g., runny nose, wheezing, chest pain)       Runny nose 11. PREGNANCY: "Is there any chance you are pregnant?" "When was your last menstrual period?"       n/a 12. TRAVEL: "Have you traveled out  of the country in the last month?" (e.g., travel history, exposures)       No- possible exposure at dental office  Protocols used: COUGH - ACUTE NON-PRODUCTIVE-A-AH

## 2018-02-22 ENCOUNTER — Ambulatory Visit (INDEPENDENT_AMBULATORY_CARE_PROVIDER_SITE_OTHER): Payer: Medicare Other | Admitting: Internal Medicine

## 2018-02-22 ENCOUNTER — Encounter: Payer: Self-pay | Admitting: Internal Medicine

## 2018-02-22 ENCOUNTER — Ambulatory Visit (INDEPENDENT_AMBULATORY_CARE_PROVIDER_SITE_OTHER)
Admission: RE | Admit: 2018-02-22 | Discharge: 2018-02-22 | Disposition: A | Discharge status: Home / Self Care | Payer: Medicare Other | Source: Ambulatory Visit | Attending: Internal Medicine | Admitting: Internal Medicine

## 2018-02-22 ENCOUNTER — Encounter

## 2018-02-22 DIAGNOSIS — M545 Low back pain, unspecified: Secondary | ICD-10-CM | POA: Insufficient documentation

## 2018-02-22 DIAGNOSIS — J209 Acute bronchitis, unspecified: Secondary | ICD-10-CM

## 2018-02-22 DIAGNOSIS — G8929 Other chronic pain: Secondary | ICD-10-CM | POA: Diagnosis not present

## 2018-02-22 DIAGNOSIS — I4891 Unspecified atrial fibrillation: Secondary | ICD-10-CM | POA: Diagnosis not present

## 2018-02-22 DIAGNOSIS — R05 Cough: Secondary | ICD-10-CM | POA: Diagnosis not present

## 2018-02-22 IMAGING — DX DG CHEST 2V
2 series · 2 of 2 positions shown · non-contrast
Comparison: [DATE]

CLINICAL DATA: Cough, congestion, wheezing

EXAM:
CHEST - 2 VIEW

[chest pa]
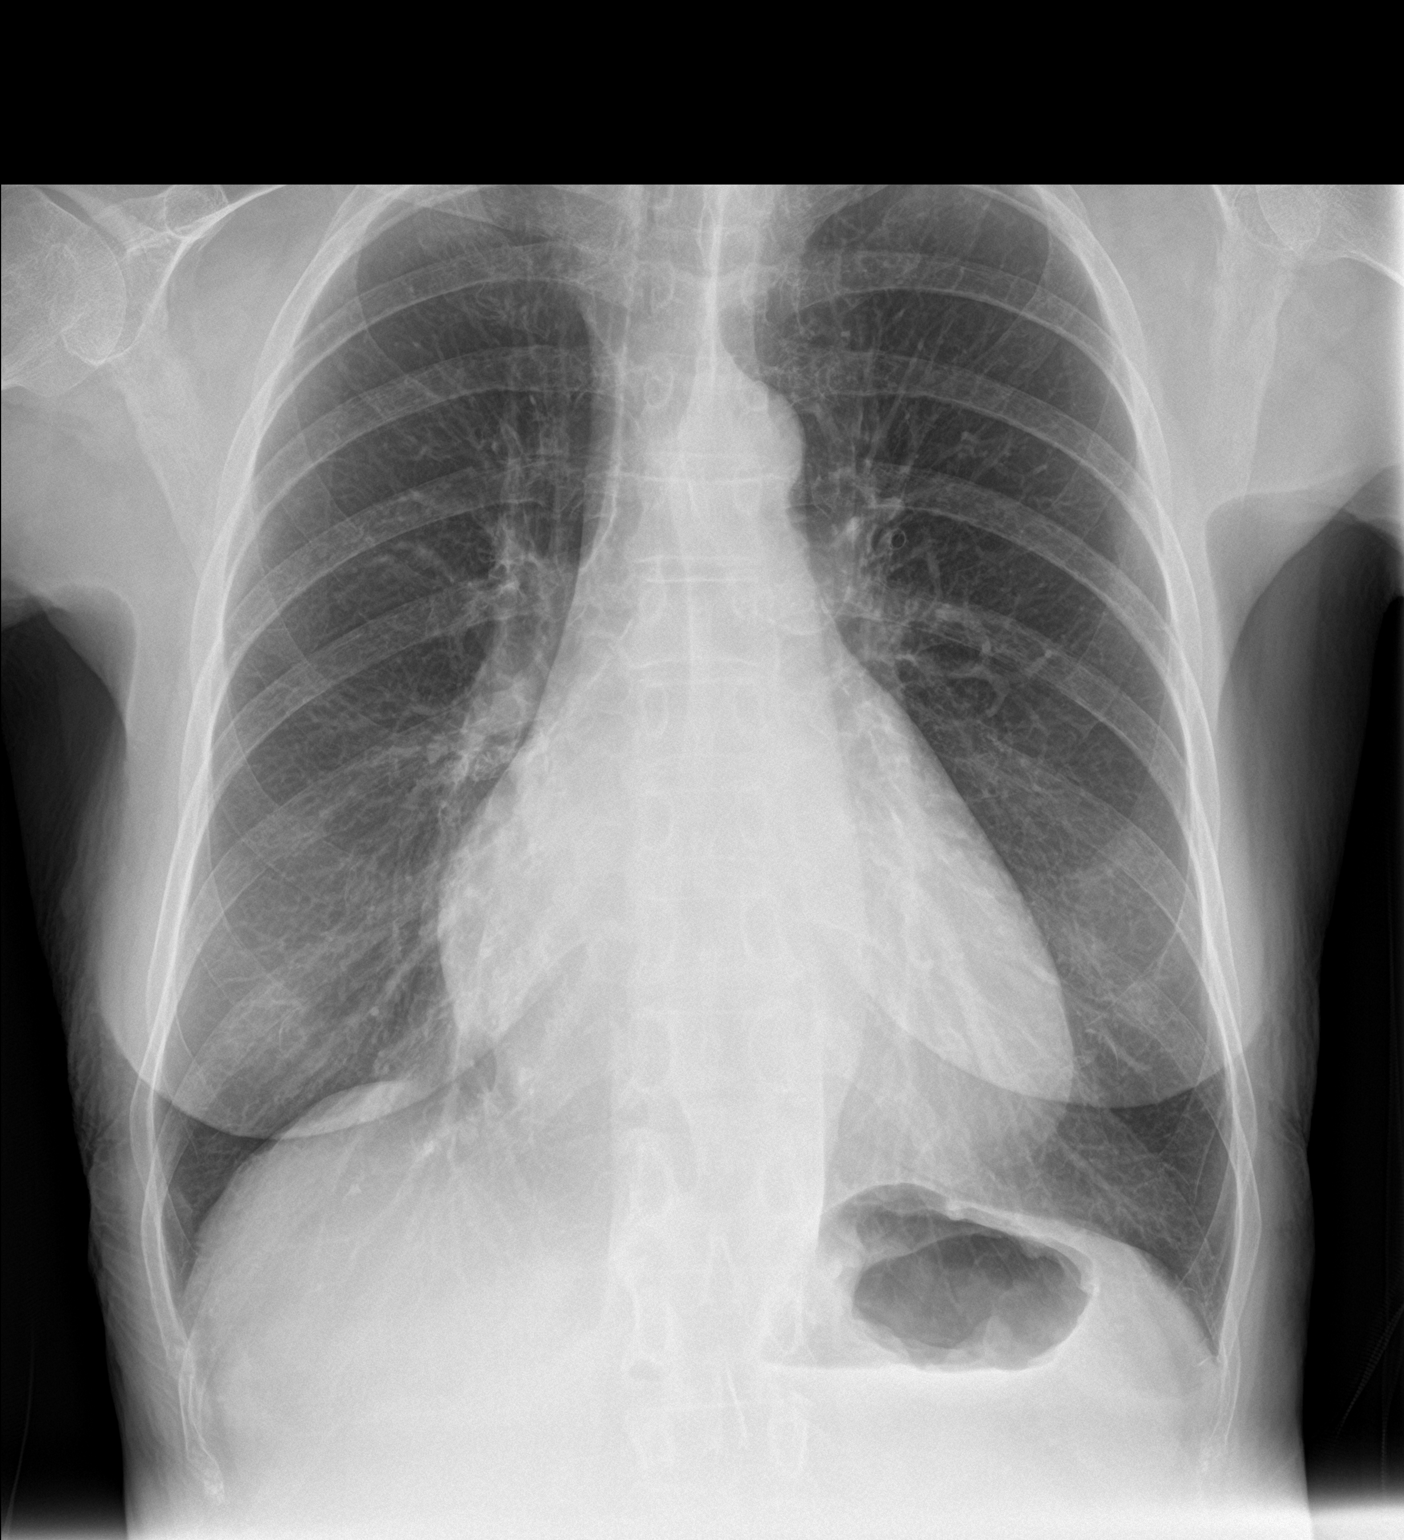

[chest lat]
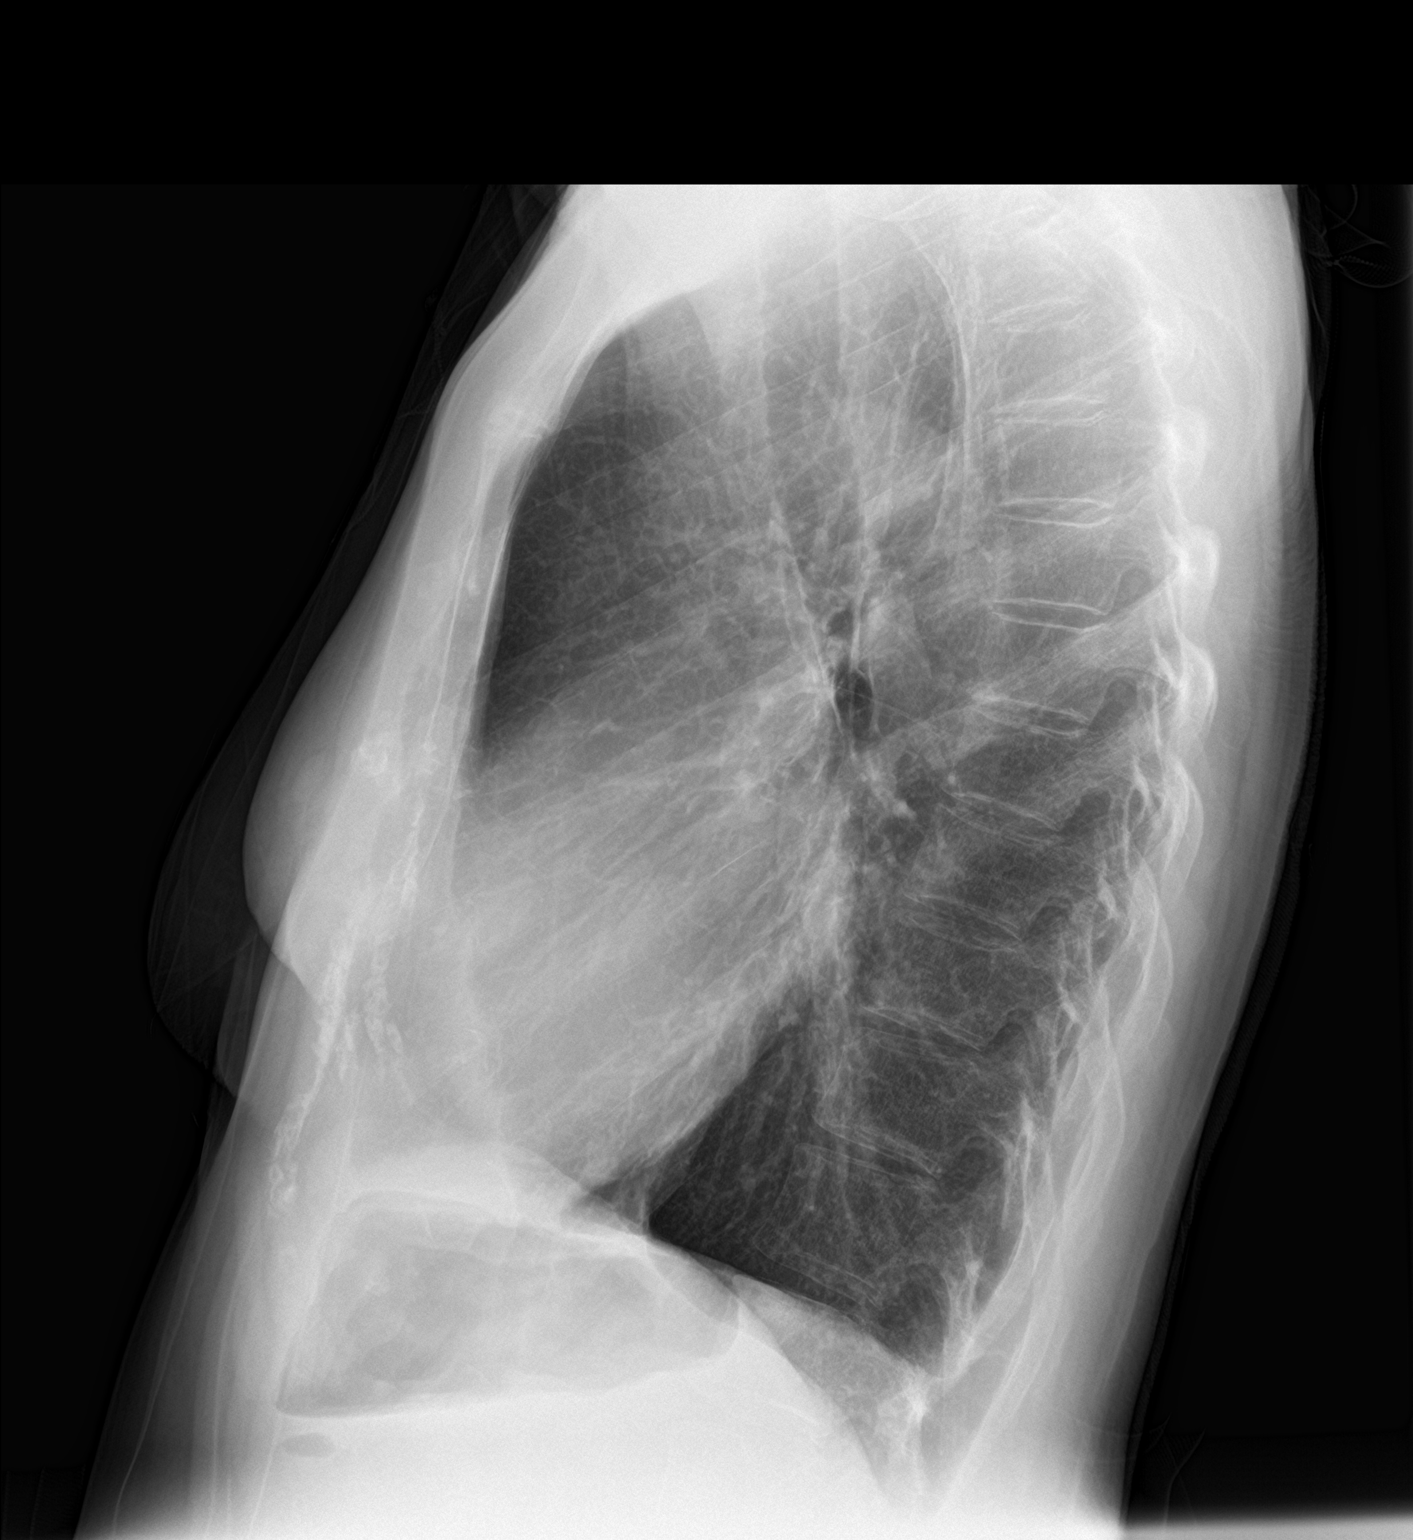

[2 of 2 positions shown; findings below may reference images not displayed]

FINDINGS: Cardiomegaly. Mild hyperinflation of the lungs. No confluent
airspace opacities or effusions. No acute bony abnormality.
IMPRESSION: Cardiomegaly, hyperinflation.  No active disease.

## 2018-02-22 MED ORDER — FLUTICASONE FUROATE-VILANTEROL 100-25 MCG/INH IN AEPB: 1.0000 | INHALATION_SPRAY | Freq: Every day | RESPIRATORY_TRACT | 5 refills | Status: DC

## 2018-02-22 MED ORDER — HYDROCODONE-HOMATROPINE 5-1.5 MG/5ML PO SYRP: 5.0000 mL | ORAL_SOLUTION | Freq: Three times a day (TID) | ORAL | 0 refills | Status: DC | PRN

## 2018-02-22 NOTE — Progress Notes (Signed)
Subjective:  Patient ID: Selena Taylor, female    DOB: 1935-06-06  Age: 83 y.o. MRN: [id number]  CC: No chief complaint on file.   HPI Selena Taylor presents for URI x2 wks- not better after Omnicef - cough is not better C/o LBP - off and on x 3-4 mo  Outpatient Medications Prior to Visit  Medication Sig Dispense Refill  . cefdinir (OMNICEF) 300 MG capsule Take 1 capsule (300 mg total) by mouth 2 (two) times daily. 20 capsule 0  . Cholecalciferol (VITAMIN D3) 2000 units capsule Take 1 capsule (2,000 Units total) by mouth daily. 100 capsule 3  . denosumab (PROLIA) 60 MG/ML SOSY injection Inject 60 mg into the skin every 6 (six) months.    . diltiazem (CARTIA XT) 240 MG 24 hr capsule Take 1 capsule (240 mg total) by mouth daily. 90 capsule 3  . nebivolol (BYSTOLIC) 5 MG tablet Take 1 tablet (5 mg total) by mouth daily. 30 tablet 12  . promethazine-dextromethorphan (PROMETHAZINE-DM) 6.25-15 MG/5ML syrup Take 5 mLs by mouth 4 (four) times daily as needed for cough. 118 mL 0  . ramipril (ALTACE) 10 MG capsule Take 1 capsule (10 mg total) by mouth daily. 30 capsule 12  . Rivaroxaban (XARELTO) 15 MG TABS tablet Take 1 tablet (15 mg total) by mouth daily with supper. 30 tablet 12  . zolpidem (AMBIEN) 10 MG tablet Take 1 tablet (10 mg total) by mouth at bedtime as needed. Patient needs office visit before refills will be given 90 tablet 1  . predniSONE (DELTASONE) 20 MG tablet Take 1 tablet (20 mg total) by mouth daily with breakfast. 5 tablet 0   No facility-administered medications prior to visit.     ROS: Review of Systems  Constitutional: Negative for activity change, appetite change, chills, fatigue and unexpected weight change.  HENT: Negative for congestion, mouth sores and sinus pressure.   Eyes: Negative for visual disturbance.  Respiratory: Positive for cough. Negative for chest tightness and shortness of breath.   Gastrointestinal: Negative for abdominal pain and nausea.    Genitourinary: Negative for difficulty urinating, frequency and vaginal pain.  Musculoskeletal: Positive for back pain. Negative for gait problem.  Skin: Negative for pallor and rash.  Neurological: Negative for dizziness, tremors, weakness, numbness and headaches.  Psychiatric/Behavioral: Negative for confusion and sleep disturbance.    Objective:  BP 120/80 (BP Location: Right Arm, Patient Position: Sitting, Cuff Size: Normal)   Pulse 80   Temp 98.2 F (36.8 C) (Oral)   Ht 5\' 2"  (1.575 m)   Wt 120 lb (54.4 kg)   SpO2 97%   BMI 21.95 kg/m   BP Readings from Last 3 Encounters:  02/22/18 120/80  02/17/18 116/74  11/23/17 128/76    Wt Readings from Last 3 Encounters:  02/22/18 120 lb (54.4 kg)  02/17/18 118 lb 0.6 oz (53.5 kg)  11/23/17 116 lb (52.6 kg)    Physical Exam Constitutional:      General: She is not in acute distress.    Appearance: She is well-developed.  HENT:     Head: Normocephalic.     Right Ear: External ear normal.     Left Ear: External ear normal.     Nose: Nose normal.  Eyes:     General:        Right eye: No discharge.        Left eye: No discharge.     Conjunctiva/sclera: Conjunctivae normal.     Pupils: Pupils  are equal, round, and reactive to light.  Neck:     Musculoskeletal: Normal range of motion and neck supple.     Thyroid: No thyromegaly.     Vascular: No JVD.     Trachea: No tracheal deviation.  Cardiovascular:     Rate and Rhythm: Tachycardia present. Rhythm irregular.     Heart sounds: Normal heart sounds.  Pulmonary:     Effort: No respiratory distress.     Breath sounds: No stridor. No wheezing.  Abdominal:     General: Bowel sounds are normal. There is no distension.     Palpations: Abdomen is soft. There is no mass.     Tenderness: There is no abdominal tenderness. There is no guarding or rebound.  Musculoskeletal:        General: No tenderness.  Lymphadenopathy:     Cervical: No cervical adenopathy.  Skin:     Findings: No erythema or rash.  Neurological:     Cranial Nerves: No cranial nerve deficit.     Motor: No abnormal muscle tone.     Coordination: Coordination normal.     Deep Tendon Reflexes: Reflexes normal.  Psychiatric:        Behavior: Behavior normal.        Thought Content: Thought content normal.        Judgment: Judgment normal.   B wheesing HR 90s LS NT, full ROM  I personally provided Breo inhaler use teaching. After the teaching patient was able to demonstrate it's use effectively. All questions were answered   Lab Results  Component Value Date   WBC 6.5 08/01/2017   HGB 13.3 08/01/2017   HCT 38.9 08/01/2017   PLT 224 08/01/2017   GLUCOSE 95 08/01/2017   CHOL 163 10/15/2014   TRIG 93.0 10/15/2014   HDL 79.80 10/15/2014   LDLCALC 65 10/15/2014   ALT 14 06/16/2016   AST 21 06/16/2016   NA 140 08/01/2017   K 4.5 08/01/2017   CL 104 08/01/2017   CREATININE 0.97 08/01/2017   BUN 19 08/01/2017   CO2 22 08/01/2017   TSH 1.650 06/16/2016   INR 2.03 (H) 02/29/2012   HGBA1C 6.5 01/28/2014    Dg Lumbar Spine 2-3 Views  Result Date: 02/17/2018 CLINICAL DATA:  Low back pain EXAM: LUMBAR SPINE - 2-3 VIEW COMPARISON:  12/27/2011 FINDINGS: Grade 1 anterolisthesis L4 on L5. Vertebral body heights are normal. Moderate-to-marked degenerative change L2-L3 and L3-L4 with mild degenerative change at L5-S1. IMPRESSION: 1. No acute osseous abnormality 2. Grade 1 anterolisthesis L4 on L5. Multiple level degenerative changes, most marked at L2-L3 and L3-L4. Electronically Signed   By: Jasmine Pang M.D.   On: 02/17/2018 19:24    Assessment & Plan:   There are no diagnoses linked to this encounter.   No orders of the defined types were placed in this encounter.    Follow-up: No follow-ups on file.  Sonda Primes, MD

## 2018-02-22 NOTE — Assessment & Plan Note (Signed)
Tachycardic CXR Treat URI

## 2018-02-22 NOTE — Assessment & Plan Note (Addendum)
Finish steroids, Omnicef Start Breo qd Hycodan CXR May need to change from Altace

## 2018-02-22 NOTE — Assessment & Plan Note (Signed)
CBD oil Tylenol X ray w/OA PT offered

## 2018-02-22 NOTE — Patient Instructions (Signed)
You can use over-the-counter  "cold" medicines  such as  "Afrin" nasal spray for nasal congestion as directed. Use " Delsym" or" Robitussin" cough syrup varietis for cough.  You can use plain "Tylenol"  for fever, chills and achyness. Use Halls or Ricola cough drops.    Please, make an appointment if you are not better or if you're worse.     If you have medicare related insurance (such as traditional Medicare, [employer], [employer], or similar), Please make an appointment at the scheduling desk with Noreene Larsson, the [employer], for your Wellness visit in this office, which is a benefit with your insurance.

## 2018-03-01 ENCOUNTER — Telehealth: Payer: Self-pay

## 2018-03-01 DIAGNOSIS — M545 Low back pain: Principal | ICD-10-CM

## 2018-03-01 DIAGNOSIS — G8929 Other chronic pain: Secondary | ICD-10-CM

## 2018-03-01 NOTE — Telephone Encounter (Signed)
Copied from CRM [id number]. Topic: Referral - Request for Referral >> Mar 01, 2018  2:32 PM Mickel Baas B, [place] wrote: Has patient seen PCP for this complaint? Yes.   *If NO, is insurance requiring patient see PCP for this issue before PCP can refer them? Referral for which specialty: Physical therapy Preferred provider/office: [facility] - Fax#: [phone] Reason for referral: States that Dr Posey Rea recommended physical therapy and when she called to schedule an appointment, they told her she needs permission from PCP.

## 2018-03-02 NOTE — Telephone Encounter (Signed)
Okay.  Done.  Thank you 

## 2018-03-08 ENCOUNTER — Ambulatory Visit (INDEPENDENT_AMBULATORY_CARE_PROVIDER_SITE_OTHER): Payer: Medicare Other

## 2018-03-08 DIAGNOSIS — M810 Age-related osteoporosis without current pathological fracture: Secondary | ICD-10-CM

## 2018-03-08 MED ORDER — DENOSUMAB 60 MG/ML ~~LOC~~ SOSY
60.0000 mg | PREFILLED_SYRINGE | Freq: Once | SUBCUTANEOUS | Status: AC
Start: 2018-03-08 — End: 2018-03-08
  Administered 2018-03-08: 60 mg via SUBCUTANEOUS

## 2018-03-13 DIAGNOSIS — T1502XA Foreign body in cornea, left eye, initial encounter: Secondary | ICD-10-CM | POA: Diagnosis not present

## 2018-03-22 ENCOUNTER — Other Ambulatory Visit: Payer: Self-pay

## 2018-03-22 ENCOUNTER — Ambulatory Visit: Payer: Medicare Other | Attending: Internal Medicine | Admitting: Physical Therapy

## 2018-03-22 ENCOUNTER — Encounter: Payer: Self-pay | Admitting: Physical Therapy

## 2018-03-22 DIAGNOSIS — G8929 Other chronic pain: Secondary | ICD-10-CM | POA: Diagnosis not present

## 2018-03-22 DIAGNOSIS — M545 Low back pain, unspecified: Secondary | ICD-10-CM

## 2018-03-22 DIAGNOSIS — M62830 Muscle spasm of back: Secondary | ICD-10-CM | POA: Insufficient documentation

## 2018-03-23 ENCOUNTER — Encounter: Payer: Self-pay | Admitting: Physical Therapy

## 2018-03-23 NOTE — Therapy (Addendum)
[place], [place], [address] Phone: [phone]   Fax:  [phone]  Physical Therapy Evaluation/Discharge   Patient Details  Name: Selena Taylor MRN: [id number] Date of Birth: Aug 10, 1935 Referring Provider (PT): Dr Narda Bonds    Encounter Date: 03/22/2018  PT End of Session - 03/22/18 1116    Visit Number  1    Number of Visits  6    Date for PT Re-Evaluation  05/03/18    Authorization Type  [employer] medicare     PT Start Time  1100    PT Stop Time  1140    PT Time Calculation (min)  40 min    Activity Tolerance  Patient tolerated treatment well    Behavior During Therapy  [facility] for tasks assessed/performed       Past Medical History:  Diagnosis Date  . Anxiety   . CHF (congestive heart failure) ([place])   . Chronic anticoagulation    on coumadin  . Endometriosis   . HTN (hypertension)   . Insomnia   . Osteopenia   . PAF (paroxysmal atrial fibrillation) ([facility])    has been on Flecainide in the past. Stopped 02/10/11; Remains on coumadin anticoagulation; intol to Rythmol and failed DCCV; noted to be in AFlutter 5/13;  Echo 01/2011:  EF 55-60%, mild MR, mild LAE.  Myoview 6/12:  No ischemia, EF 74%  . Pericardial effusion   . Pericardial effusion 02/23/2012  . Shortness of breath   . TIA (transient ischemic attack)     Past Surgical History:  Procedure Laterality Date  . APPENDECTOMY    . CARDIOVERSION  01/14/2011   Procedure: CARDIOVERSION;  Surgeon: Deboraha Sprang, MD;  Location: [place];  Service: Cardiovascular;  Laterality: N/A;  To be completed in Neuro OR 33 time slot 0830 12/20  . CARDIOVERSION  05/27/2011   Procedure: CARDIOVERSION;  Surgeon: Deboraha Sprang, MD;  Location: [place];  Service: Cardiovascular;  Laterality: N/A;  . PERICARDIAL WINDOW  02/25/2012   Procedure: PERICARDIAL WINDOW;  Surgeon: Grace Isaac, MD;  Location: [facility] OR;  Service: Thoracic;  Laterality: N/A;  . POLYPECTOMY     . skin cancer removal    . TEE WITHOUT CARDIOVERSION  02/25/2012   Procedure: TRANSESOPHAGEAL ECHOCARDIOGRAM (TEE);  Surgeon: Grace Isaac, MD;  Location: [place];  Service: Thoracic;  Laterality: N/A;  . TOTAL HYSTERECTOMY AND BILATERAL SALPINGOOPHERECTOMY      There were no vitals filed for this visit.   Subjective Assessment - 03/22/18 1107    Subjective  Patient had an onset of lower back pain about 4-5 months ago. When she has the pain it is sharp and shoots into the left side of her hip. she feels it when she does activity like raking and vaccuming. The pain ussually lasts about 2-3 days.     Pertinent History  osteoperois,    Currently in Pain?  Yes    Pain Score  6     Pain Location  Back    Pain Orientation  Left    Pain Descriptors / Indicators  Aching    Pain Type  Chronic pain    Pain Onset  More than a month ago    Pain Frequency  Constant    Aggravating Factors   raking; vaccuming     Pain Relieving Factors  rest    Effect of Pain on Daily Activities  difficulty perfroming ADL's  [facility] PT Assessment - 03/23/18 0001      Assessment   Medical Diagnosis  Left Sided Lwo Back Pain     Referring Provider (PT)  Dr Narda Bonds     Onset Date/Surgical Date  --   5 momths prior    Hand Dominance  Right    Prior Therapy  None       Precautions   Precautions  None      Restrictions   Weight Bearing Restrictions  No      Balance Screen   Has the patient fallen in the past 6 months  No    Has the patient had a decrease in activity level because of a fear of falling?   No    Is the patient reluctant to leave their home because of a fear of falling?   No      Prior Function   Level of Independence  Independent    Vocation  Retired      Charity fundraiser Status  Within Functional Limits for tasks assessed    Attention  Focused    Focused Attention  Appears intact    Memory  Appears intact    Awareness  Appears intact    Problem  Solving  Appears intact      Sensation   Additional Comments  denies radicular pain or parathesias       Coordination   Gross Motor Movements are Fluid and Coordinated  Yes    Fine Motor Movements are Fluid and Coordinated  Yes      AROM   AROM Assessment Site  Lumbar    Lumbar Flexion  mild pain at end range     Lumbar Extension  WNL    Lumbar - Right Side Bend  WN:L     Lumbar - Left Side Bend  WNL     Lumbar - Right Rotation  WNL     Lumbar - Left Rotation  'mild pain       PROM   Overall PROM Comments  full passive ROM of the patients       Strength   Overall Strength  Deficits    Overall Strength Comments  4+/5 LEFT HIP STRENGTH       Flexibility   Soft Tissue Assessment /Muscle Length  yes    Hamstrings  90/90 left 20 degree limitation 10 degree limitation on the right       Palpation   Palpation comment  mild spamsing into elft hip and left quadratus       Special Tests   Other special tests  SLR (-) bilateral                 Objective measurements completed on examination: See above findings.      [place] Adult PT Treatment/Exercise - 03/23/18 0001      Exercises   Exercises  Lumbar      Lumbar Exercises: Stretches   Active Hamstring Stretch Limitations  seated and supine hamstirng stretch 2x20 sec hold     Other Lumbar Stretch Exercise  piriformis stretch 3x20 sec hold    Other Lumbar Stretch Exercise  prayer stretch 2x20 sec lateral prayer 2x20 sec hold       Lumbar Exercises: Supine   Other Supine Lumbar Exercises  bridge 2x10; SLRx10 bilateral; calm shell xx10 red              PT Education - 03/22/18 1109  Person(s) Educated  Patient    Methods  Explanation;Demonstration;Tactile cues;Verbal cues    Comprehension  Verbalized understanding;Returned demonstration;Tactile cues required          PT Long Term Goals - 03/23/18 1247      PT LONG TERM GOAL #1   Title  Patient will be independent with exercises and stretching  program to prevent flair-ups     Time  6    Period  Weeks    Status  New    Target Date  05/04/18      PT LONG TERM GOAL #2   Title  Patient will perfrom vaccuming and raking without pain     Time  6    Period  Weeks    Status  New    Target Date  05/04/18      PT LONG TERM GOAL #3   Title  Patient will demonstrate 5/5 gorss bilateral lower extremity strength in order to perfrom ADL's     Time  6    Period  Weeks    Status  New    Target Date  05/04/18             Plan - 03/23/18 1141    Clinical Impression Statement  Patient is an active 83 year old female who has episodes of left sided lower back pain when she vaccums and perfroms household activity. The episodes ussualy last for 2-3 days. Her pain is shrp when it happens. It has not happened for several days now. Signs and symptoms are consitent with lumbar DDD. She has good hip and lumbar movement for her age. She does have spsasming in her left quadratus and left paraspinals. She has wekaness in her core. She does Ta-chi and has a good understadning of breathing with core exercises. She was given a basic HEP for core strengthening and stretching. She will conew back for 1-2 visits for updates in her HEP and come back if she has an exacerbat    History and Personal Factors relevant to plan of care:  Osteoperosis     Clinical Presentation  Stable    Clinical Decision Making  Low    Rehab Potential  Good    PT Frequency  1x / week    PT Duration  6 weeks    PT Treatment/Interventions  ADLs/Self Care Home Management;Cryotherapy;Electrical Stimulation;Moist Heat;Ultrasound;Therapeutic activities;Therapeutic exercise;Neuromuscular re-education;Manual techniques    PT Next Visit Plan  consider standing postural exercises and standing hip exercises; review HEP and see how patient tolerated. Add any core exercises the patient could tolerate.     PT Home Exercise Plan  clamshell, bridge, SLR, hamstring stretch, piriformis stretch,  prayer stretch     Consulted and Agree with Plan of Care  Patient       Patient will benefit from skilled therapeutic intervention in order to improve the following deficits and impairments:  Pain, Increased muscle spasms, Decreased activity tolerance, Decreased strength  Visit Diagnosis: Chronic left-sided low back pain without sciatica  Muscle spasm of back    PHYSICAL THERAPY DISCHARGE SUMMARY  Visits from Start of Care: 1   Current functional level related to goals / functional outcomes: Unknown    Remaining deficits: Unknown    Education / Equipment: unknown  Plan: Patient agrees to discharge.  Patient goals were not met. Patient is being discharged due to not returning since the last visit.  ?????      Problem List Patient Active Problem List   Diagnosis  Date Noted  . Acute bronchitis 02/22/2018  . Low back pain 02/22/2018  . Actinic keratoses 12/06/2016  . History of melanoma 12/06/2016  . Osteoporosis 08/13/2016  . Well adult exam 10/07/2014  . Elevated glucose 01/23/2014  . Acute pericarditis, unspecified 07/02/2013  . Fatigue 07/02/2013  . Tremor 04/04/2012  . Chronic anticoagulation   . Myalgia 02/05/2012  . TIA (transient ischemic attack) 03/16/2010  . Insomnia 11/03/2006  . Essential hypertension 11/03/2006  . Atrial fibrillation ([place]) 11/03/2006    Carney Living Pt DPT  03/23/2018, 12:50 PM  [facility] [address] [place], [place], [address] Phone: [phone]   Fax:  [phone]  Name: Selena Taylor MRN: [id number] Date of Birth: 1935-10-22

## 2018-04-05 DIAGNOSIS — L820 Inflamed seborrheic keratosis: Secondary | ICD-10-CM | POA: Diagnosis not present

## 2018-04-05 DIAGNOSIS — L821 Other seborrheic keratosis: Secondary | ICD-10-CM | POA: Diagnosis not present

## 2018-04-05 DIAGNOSIS — L570 Actinic keratosis: Secondary | ICD-10-CM | POA: Diagnosis not present

## 2018-04-05 DIAGNOSIS — Z85828 Personal history of other malignant neoplasm of skin: Secondary | ICD-10-CM | POA: Diagnosis not present

## 2018-04-11 NOTE — Progress Notes (Signed)
Medical screening examination/treatment/procedure(s) were performed by non-physician practitioner and as supervising physician I was immediately available for consultation/collaboration. I agree with above. Carrianne Hyun, MD  

## 2018-04-12 ENCOUNTER — Encounter: Payer: Medicare Other | Admitting: Physical Therapy

## 2018-06-08 ENCOUNTER — Other Ambulatory Visit: Payer: Self-pay | Admitting: Internal Medicine

## 2018-06-08 NOTE — Telephone Encounter (Signed)
Please advise about refill in Dr. Plotnikovs absence. 

## 2018-07-28 ENCOUNTER — Other Ambulatory Visit: Payer: Self-pay | Admitting: Internal Medicine

## 2018-07-31 NOTE — Telephone Encounter (Signed)
Please advise 

## 2018-07-31 NOTE — Telephone Encounter (Signed)
Patient is completely out of her medication and would like to know if she can get her refill today. Also patient needs to talk to someone about her 3rd prolia shot that she is due and needs to schedule appt.

## 2018-08-01 ENCOUNTER — Other Ambulatory Visit: Payer: Self-pay | Admitting: Internal Medicine

## 2018-08-01 DIAGNOSIS — H524 Presbyopia: Secondary | ICD-10-CM | POA: Diagnosis not present

## 2018-08-04 ENCOUNTER — Telehealth: Payer: Self-pay | Admitting: Internal Medicine

## 2018-08-04 ENCOUNTER — Encounter: Payer: Self-pay | Admitting: Internal Medicine

## 2018-08-04 NOTE — Telephone Encounter (Signed)

## 2018-08-04 NOTE — Telephone Encounter (Signed)
New Message ° ° ° ° °Left message to confirm appt and answer COVID Questions  °

## 2018-08-07 ENCOUNTER — Other Ambulatory Visit: Payer: Self-pay

## 2018-08-07 ENCOUNTER — Ambulatory Visit (INDEPENDENT_AMBULATORY_CARE_PROVIDER_SITE_OTHER): Payer: Medicare Other | Admitting: Internal Medicine

## 2018-08-07 ENCOUNTER — Encounter: Payer: Self-pay | Admitting: Internal Medicine

## 2018-08-07 VITALS — BP 124/70 | HR 63 | Ht 62.0 in | Wt 126.0 lb

## 2018-08-07 DIAGNOSIS — I482 Chronic atrial fibrillation, unspecified: Secondary | ICD-10-CM | POA: Diagnosis not present

## 2018-08-07 DIAGNOSIS — G459 Transient cerebral ischemic attack, unspecified: Secondary | ICD-10-CM | POA: Diagnosis not present

## 2018-08-07 MED ORDER — DILTIAZEM HCL ER COATED BEADS 240 MG PO CP24: 240.0000 mg | ORAL_CAPSULE | Freq: Every day | ORAL | 3 refills | Status: DC

## 2018-08-07 MED ORDER — RIVAROXABAN 15 MG PO TABS: 15.0000 mg | ORAL_TABLET | Freq: Every day | ORAL | 3 refills | Status: DC

## 2018-08-07 MED ORDER — RAMIPRIL 10 MG PO CAPS: 10.0000 mg | ORAL_CAPSULE | Freq: Every day | ORAL | 3 refills | Status: DC

## 2018-08-07 MED ORDER — NEBIVOLOL HCL 5 MG PO TABS: 5.0000 mg | ORAL_TABLET | Freq: Every day | ORAL | 3 refills | Status: DC

## 2018-08-07 NOTE — Progress Notes (Signed)
PCP:  Cassandria Anger, MD Primary Cardiologist: No primary care provider on file. Electrophysiologist: Dr. Andrey Campanile is a 83 y.o. female who presents today for routine electrophysiology followup. They are seen with Dr. Caryl Comes. Since last being seen in our clinic, the patient reports doing very well.  She denies symptoms of palpitations, chest pain, shortness of breath, orthopnea, PND, lower extremity edema, claudication, dizziness, presyncope, syncope, bleeding, or neurologic sequela. The patient is tolerating medications without difficulties.  She reports compliance with her Xarelto.  The patient feels that she is tolerating medications without difficulties and is otherwise without complaint today.   Past Medical History:  Diagnosis Date  . Anxiety   . CHF (congestive heart failure) ([place])   . Chronic anticoagulation    on coumadin  . Endometriosis   . HTN (hypertension)   . Insomnia   . Osteopenia   . PAF (paroxysmal atrial fibrillation) ([facility])    has been on Flecainide in the past. Stopped 02/10/11; Remains on coumadin anticoagulation; intol to Rythmol and failed DCCV; noted to be in AFlutter 5/13;  Echo 01/2011:  EF 55-60%, mild MR, mild LAE.  Myoview 6/12:  No ischemia, EF 74%  . Pericardial effusion   . Pericardial effusion 02/23/2012  . Shortness of breath   . TIA (transient ischemic attack)    Past Surgical History:  Procedure Laterality Date  . APPENDECTOMY    . CARDIOVERSION  01/14/2011   Procedure: CARDIOVERSION;  Surgeon: Deboraha Sprang, MD;  Location: [facility];  Service: Cardiovascular;  Laterality: N/A;  To be completed in Neuro OR 33 time slot 0830 12/20  . CARDIOVERSION  05/27/2011   Procedure: CARDIOVERSION;  Surgeon: Deboraha Sprang, MD;  Location: [facility];  Service: Cardiovascular;  Laterality: N/A;  . PERICARDIAL WINDOW  02/25/2012   Procedure: PERICARDIAL WINDOW;  Surgeon: Grace Isaac, MD;  Location: [facility] OR;  Service: Thoracic;  Laterality: N/A;  .  POLYPECTOMY    . skin cancer removal    . TEE WITHOUT CARDIOVERSION  02/25/2012   Procedure: TRANSESOPHAGEAL ECHOCARDIOGRAM (TEE);  Surgeon: Grace Isaac, MD;  Location: [facility] OR;  Service: Thoracic;  Laterality: N/A;  . TOTAL HYSTERECTOMY AND BILATERAL SALPINGOOPHERECTOMY      Current Outpatient Medications  Medication Sig Dispense Refill  . Cholecalciferol (VITAMIN D3) 2000 units capsule Take 1 capsule (2,000 Units total) by mouth daily. 100 capsule 3  . denosumab (PROLIA) 60 MG/ML SOSY injection Inject 60 mg into the skin every 6 (six) months.    . diltiazem (CARTIA XT) 240 MG 24 hr capsule Take 1 capsule (240 mg total) by mouth daily. Pt needs to make appt by Aug for further refills. Thanks 90 capsule 0  . nebivolol (BYSTOLIC) 5 MG tablet Take 1 tablet (5 mg total) by mouth daily. 30 tablet 12  . ramipril (ALTACE) 10 MG capsule Take 1 capsule (10 mg total) by mouth daily. 30 capsule 12  . Rivaroxaban (XARELTO) 15 MG TABS tablet Take 1 tablet (15 mg total) by mouth daily with supper. 30 tablet 12  . zolpidem (AMBIEN) 10 MG tablet TAKE 1 TABLET(10 MG) BY MOUTH AT BEDTIME AS NEEDED FOR SLEEP. NEED OFFICE VISIT BEFORE REFILLS WILL BE GIVEN 30 tablet 3   No current facility-administered medications for this visit.     Allergies  Allergen Reactions  . Other Other (See Comments)    Novocaine.  REACTION:  Rapid heart rate  . Procaine Hcl     Rapid  heart rate    Social History   Socioeconomic History  . Marital status: Widowed    Spouse name: Not on file  . Number of children: Not on file  . Years of education: Not on file  . Highest education level: Not on file  Occupational History  . Not on file  Social Needs  . Financial resource strain: Not on file  . Food insecurity    Worry: Not on file    Inability: Not on file  . Transportation needs    Medical: Not on file    Non-medical: Not on file  Tobacco Use  . Smoking status: Never Smoker  . Smokeless tobacco: Never Used   Substance and Sexual Activity  . Alcohol use: Yes    Alcohol/week: 14.0 standard drinks    Types: 14 Glasses of wine per week    Comment: 1-2 glasses of wine nightly  . Drug use: No  . Sexual activity: Not Currently  Lifestyle  . Physical activity    Days per week: Not on file    Minutes per session: Not on file  . Stress: Not on file  Relationships  . Social Musicianconnections    Talks on phone: Not on file    Gets together: Not on file    Attends religious service: Not on file    Active member of club or organization: Not on file    Attends meetings of clubs or organizations: Not on file    Relationship status: Not on file  . Intimate partner violence    Fear of current or ex partner: Not on file    Emotionally abused: Not on file    Physically abused: Not on file    Forced sexual activity: Not on file  Other Topics Concern  . Not on file  Social History Narrative   HSG; Became a stewardness..   Married - 1959.Marland Kitchen.   1 son - '65; 1 daughter '60; 2 grandchildren..   Occupation: Retired..   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   End of life Care: no DNR, DNI, no futile or heroic measures.              Review of Systems: General: No chills, fever, night sweats or weight changes  Cardiovascular:  No chest pain, dyspnea on exertion, edema, orthopnea, palpitations, paroxysmal nocturnal dyspnea Dermatological: No rash, lesions or masses Respiratory: No cough, dyspnea Urologic: No hematuria, dysuria Abdominal: No nausea, vomiting, diarrhea, bright red blood per rectum, melena, or hematemesis Neurologic: No visual changes, weakness, changes in mental status All other systems reviewed and are otherwise negative except as noted above.  Physical Exam: Vitals:   08/07/18 1049  BP: 124/70  Pulse: 63  SpO2: 97%  Weight: 126 lb (57.2 kg)  Height: 5\' 2"  (1.575 m)    GEN- The patient is well appearing, alert and oriented x 3 today.   HEENT: normocephalic, atraumatic; sclera  clear, conjunctiva pink; hearing intact; oropharynx clear; neck supple, no JVP Lymph- no cervical lymphadenopathy Lungs- Clear to ausculation bilaterally, normal work of breathing.  No wheezes, rales, rhonchi Heart- Regular rate and rhythm, no murmurs, rubs or gallops, PMI not laterally displaced GI- soft, non-tender, non-distended, bowel sounds present, no hepatosplenomegaly Extremities- no clubbing, cyanosis, or edema; DP/PT/radial pulses 2+ bilaterally MS- no significant deformity or atrophy Skin- warm and dry, no rash or lesion Psych- euthymic mood, full affect Neuro- strength and sensation are intact  EKG is ordered today. Personal review demonstrates Afib with  controlled ventricular rate 63 bpm.   Assessment and Plan:  Atrial fibrillation with a Controlled ventricular response - Asymptomatic. She is on Xarelto with no bleeding or easy bruising. Will check CBC   Renal insufficiency - Plan BMET.   Hypertension - Stable on current regimen  Doing well overall. Will plan labs and then continue annual follow up. Pt knows to call with any symptoms.    Graciella FreerMichael Andrew Tillery, PA-C  08/07/18 11:13 AM

## 2018-08-07 NOTE — Patient Instructions (Signed)
Medication Instructions:  Your physician recommends that you continue on your current medications as directed. Please refer to the Current Medication list given to you today.  Labwork: None ordered.  Testing/Procedures: None ordered.  Follow-Up: Your physician recommends that you schedule a follow-up appointment in:   12 mo with Dr. Klein   Any Other Special Instructions Will Be Listed Below (If Applicable).     If you need a refill on your cardiac medications before your next appointment, please call your pharmacy.  

## 2018-08-10 ENCOUNTER — Other Ambulatory Visit: Payer: Medicare Other | Admitting: *Deleted

## 2018-08-10 ENCOUNTER — Other Ambulatory Visit: Payer: Self-pay

## 2018-08-10 ENCOUNTER — Other Ambulatory Visit: Payer: Self-pay | Admitting: *Deleted

## 2018-08-10 DIAGNOSIS — N289 Disorder of kidney and ureter, unspecified: Secondary | ICD-10-CM

## 2018-08-10 DIAGNOSIS — Z79899 Other long term (current) drug therapy: Secondary | ICD-10-CM

## 2018-08-10 DIAGNOSIS — I480 Paroxysmal atrial fibrillation: Secondary | ICD-10-CM

## 2018-08-10 LAB — BASIC METABOLIC PANEL
BUN/Creatinine Ratio: 23 (ref 12–28)
BUN: 24 mg/dL (ref 8–27)
CO2: 22 mmol/L (ref 20–29)
Calcium: 9 mg/dL (ref 8.7–10.3)
Chloride: 104 mmol/L (ref 96–106)
Creatinine, Ser: 1.03 mg/dL — ABNORMAL HIGH (ref 0.57–1.00)
GFR calc Af Amer: 59 mL/min/{1.73_m2} — ABNORMAL LOW (ref >59)
GFR calc non Af Amer: 51 mL/min/{1.73_m2} — ABNORMAL LOW (ref >59)
Glucose: 121 mg/dL — ABNORMAL HIGH (ref 65–99)
Potassium: 4.9 mmol/L (ref 3.5–5.2)
Sodium: 140 mmol/L (ref 134–144)

## 2018-09-04 DIAGNOSIS — H25813 Combined forms of age-related cataract, bilateral: Secondary | ICD-10-CM | POA: Diagnosis not present

## 2018-09-04 DIAGNOSIS — H1851 Endothelial corneal dystrophy: Secondary | ICD-10-CM | POA: Diagnosis not present

## 2018-09-04 DIAGNOSIS — H5315 Visual distortions of shape and size: Secondary | ICD-10-CM | POA: Diagnosis not present

## 2018-09-04 DIAGNOSIS — H43813 Vitreous degeneration, bilateral: Secondary | ICD-10-CM | POA: Diagnosis not present

## 2018-09-06 ENCOUNTER — Telehealth: Payer: Self-pay | Admitting: Internal Medicine

## 2018-09-06 NOTE — Telephone Encounter (Signed)
Pt is having cataract surgery next Tuesday and wondered if she should stop her anticoagulant. Pt's eye surgeon stated she is okay to stay on her anticoagulant. I advised pt if her eye surgeon stated she could stay on her anticoagulant, then it should be okay to stay on it. She verbalized understanding and had no additional questions.

## 2018-09-06 NOTE — Telephone Encounter (Signed)
New message:    Patient calling stating she will be having surgery 09/12/18 and she would like for some one to call her about stopping a medication. The doctor who is doing the surgery stated it was optional. Please call patient.

## 2018-09-09 NOTE — Telephone Encounter (Signed)
noted 

## 2018-09-12 DIAGNOSIS — H25811 Combined forms of age-related cataract, right eye: Secondary | ICD-10-CM | POA: Diagnosis not present

## 2018-09-12 DIAGNOSIS — H5709 Other anomalies of pupillary function: Secondary | ICD-10-CM | POA: Diagnosis not present

## 2018-09-25 ENCOUNTER — Encounter: Payer: Self-pay | Admitting: Physical Therapy

## 2018-09-25 DIAGNOSIS — H16103 Unspecified superficial keratitis, bilateral: Secondary | ICD-10-CM | POA: Diagnosis not present

## 2018-09-25 DIAGNOSIS — H25813 Combined forms of age-related cataract, bilateral: Secondary | ICD-10-CM | POA: Diagnosis not present

## 2018-09-25 DIAGNOSIS — H04123 Dry eye syndrome of bilateral lacrimal glands: Secondary | ICD-10-CM | POA: Diagnosis not present

## 2018-09-25 DIAGNOSIS — H1851 Endothelial corneal dystrophy: Secondary | ICD-10-CM | POA: Diagnosis not present

## 2018-09-27 ENCOUNTER — Other Ambulatory Visit: Payer: Self-pay

## 2018-09-27 ENCOUNTER — Ambulatory Visit (INDEPENDENT_AMBULATORY_CARE_PROVIDER_SITE_OTHER): Payer: Medicare Other

## 2018-09-27 DIAGNOSIS — M810 Age-related osteoporosis without current pathological fracture: Secondary | ICD-10-CM | POA: Diagnosis not present

## 2018-09-27 MED ORDER — DENOSUMAB 60 MG/ML ~~LOC~~ SOSY
60.0000 mg | PREFILLED_SYRINGE | Freq: Once | SUBCUTANEOUS | Status: AC
  Administered 2018-09-27: 60 mg via SUBCUTANEOUS

## 2018-10-03 ENCOUNTER — Telehealth: Payer: Self-pay | Admitting: Internal Medicine

## 2018-10-03 NOTE — Telephone Encounter (Signed)
   Primary Cardiologist: Virl Axe, MD  Chart reviewed as part of pre-operative protocol coverage. Cataract extractions are recognized in guidelines as low risk surgeries that do not typically require specific preoperative testing or holding of blood thinner therapy. Therefore, given past medical history and time since last visit, based on ACC/AHA guidelines, Selena Taylor would be at acceptable risk for the planned procedure without further cardiovascular testing.   I will route this recommendation to the requesting party via Epic fax function and remove from pre-op pool.  Please call with questions.  [place], PA 10/03/2018, 2:04 PM

## 2018-10-03 NOTE — Telephone Encounter (Signed)
     [facility] Pre-operative Risk Assessment    Request for surgical clearance:  1. What type of surgery is being performed? Cataract surgery on Left eye  2. When is this surgery scheduled? 10/10/18  3. What type of clearance is required (medical clearance vs. Pharmacy clearance to hold med vs. Both)? pharmacy  4. Are there any medications that need to be held prior to surgery and how long? Xarelto, 1-3 prior  5. Practice name and name of physician performing surgery? [facility], Dr Shon Hough  6. What is your office phone number [phone] ext [phone]   7.   What is your office fax number [phone]  8.   Anesthesia type (None, local, MAC, general) ? Topical MAC   Gwendel Hanson 10/03/2018, 11:10 AM  _________________________________________________________________   (provider comments below)

## 2018-10-06 ENCOUNTER — Telehealth: Payer: Self-pay | Admitting: Internal Medicine

## 2018-10-06 NOTE — Telephone Encounter (Signed)
   Primary Cardiologist: Virl Axe, MD  Chart reviewed as part of pre-operative protocol coverage. Cataract extractions are recognized in guidelines as low risk surgeries that do not typically require specific preoperative testing or holding of blood thinner therapy. Therefore, given past medical history and time since last visit, based on ACC/AHA guidelines, NOLENE ROCKS would be at acceptable risk for the planned procedure without further cardiovascular testing.   I will route this recommendation to the requesting party via Epic fax function and remove from pre-op pool.  Please call with questions.  Left a voicemail with these recommendations at Dr. Elder Negus office as a call was requested.   Abigail Butts, PA-C 10/06/2018, 2:20 PM

## 2018-10-06 NOTE — Telephone Encounter (Signed)
° °  [facility] Pre-operative Risk Assessment    Request for surgical clearance:  1. What type of surgery is being performed?  Cataract Eye surgery   2. When is this surgery scheduled?  10-10-18   3. What type of clearance is required (medical clearance vs. Pharmacy clearance to hold med vs. Both)? Pharmacy  4. Are there any medications that need to be held prior to surgery and how long? Xarelto- Doctor wants to know if pt can stop her Xarelto prior to her surgery on 10-10-18   5. Practice name and name of physician performing surgery? Dr Consuelo Pandy   6. What is your office phone number [phone]- she wants a call asap please*   7.   What is your office fax number did not give a fax number 8.   Anesthesia type (None, local, MAC, general) ?  Did not say*   Glyn Ade 10/06/2018, 1:42 PM  _________________________________________________________________   (provider comments below)

## 2018-10-10 DIAGNOSIS — H25812 Combined forms of age-related cataract, left eye: Secondary | ICD-10-CM | POA: Diagnosis not present

## 2018-10-10 DIAGNOSIS — H268 Other specified cataract: Secondary | ICD-10-CM | POA: Diagnosis not present

## 2018-10-10 DIAGNOSIS — H5703 Miosis: Secondary | ICD-10-CM | POA: Diagnosis not present

## 2018-10-10 DIAGNOSIS — H21562 Pupillary abnormality, left eye: Secondary | ICD-10-CM | POA: Diagnosis not present

## 2018-10-15 NOTE — Progress Notes (Signed)
Medical screening examination/treatment/procedure(s) were performed by non-physician practitioner and as supervising physician I was immediately available for consultation/collaboration. I agree with above. Rashel Okeefe, MD  

## 2018-10-20 ENCOUNTER — Ambulatory Visit: Payer: Medicare Other

## 2018-10-20 DIAGNOSIS — D044 Carcinoma in situ of skin of scalp and neck: Secondary | ICD-10-CM | POA: Diagnosis not present

## 2018-10-20 DIAGNOSIS — Z85828 Personal history of other malignant neoplasm of skin: Secondary | ICD-10-CM | POA: Diagnosis not present

## 2018-10-23 ENCOUNTER — Ambulatory Visit (INDEPENDENT_AMBULATORY_CARE_PROVIDER_SITE_OTHER): Payer: Medicare Other

## 2018-10-23 ENCOUNTER — Other Ambulatory Visit: Payer: Self-pay

## 2018-10-23 DIAGNOSIS — Z23 Encounter for immunization: Secondary | ICD-10-CM | POA: Diagnosis not present

## 2018-11-01 ENCOUNTER — Ambulatory Visit (INDEPENDENT_AMBULATORY_CARE_PROVIDER_SITE_OTHER): Payer: Medicare Other | Admitting: Internal Medicine

## 2018-11-01 ENCOUNTER — Other Ambulatory Visit (INDEPENDENT_AMBULATORY_CARE_PROVIDER_SITE_OTHER): Payer: Medicare Other

## 2018-11-01 ENCOUNTER — Ambulatory Visit: Payer: Medicare Other | Admitting: Internal Medicine

## 2018-11-01 ENCOUNTER — Other Ambulatory Visit: Payer: Self-pay

## 2018-11-01 ENCOUNTER — Encounter: Payer: Self-pay | Admitting: Internal Medicine

## 2018-11-01 VITALS — BP 114/72 | HR 88 | Temp 98.3°F | Ht 62.0 in | Wt 126.0 lb

## 2018-11-01 DIAGNOSIS — R109 Unspecified abdominal pain: Secondary | ICD-10-CM | POA: Diagnosis not present

## 2018-11-01 DIAGNOSIS — Z7901 Long term (current) use of anticoagulants: Secondary | ICD-10-CM

## 2018-11-01 DIAGNOSIS — Z85828 Personal history of other malignant neoplasm of skin: Secondary | ICD-10-CM | POA: Diagnosis not present

## 2018-11-01 DIAGNOSIS — D044 Carcinoma in situ of skin of scalp and neck: Secondary | ICD-10-CM | POA: Diagnosis not present

## 2018-11-01 DIAGNOSIS — M545 Low back pain, unspecified: Secondary | ICD-10-CM

## 2018-11-01 DIAGNOSIS — G8929 Other chronic pain: Secondary | ICD-10-CM

## 2018-11-01 LAB — URINALYSIS, ROUTINE W REFLEX MICROSCOPIC
Bilirubin Urine: NEGATIVE
Hgb urine dipstick: NEGATIVE
Nitrite: NEGATIVE
Specific Gravity, Urine: >=1.03 — AB (ref 1.000–1.030)
Total Protein, Urine: NEGATIVE
Urine Glucose: NEGATIVE
Urobilinogen, UA: 0.2 (ref 0.0–1.0)
pH: 5 (ref 5.0–8.0)

## 2018-11-01 MED ORDER — LIDOCAINE 5 % EX PTCH: 1.0000 | MEDICATED_PATCH | CUTANEOUS | 1 refills | Status: DC

## 2018-11-01 NOTE — Assessment & Plan Note (Signed)
UA Trigger point inj

## 2018-11-01 NOTE — Progress Notes (Signed)
Subjective:  Patient ID: Selena Taylor, female    DOB: 1935/03/21  Age: 83 y.o. MRN: [id number]  CC: No chief complaint on file.   HPI [name] presents for LBP and L flank pain off and on.  It is severe, sudden, would last x 5 min and may last all day go away x months Pain is 9/10 in intensity The pt can tolerate Lidocaine OK  Outpatient Medications Prior to Visit  Medication Sig Dispense Refill  . Cholecalciferol (VITAMIN D3) 2000 units capsule Take 1 capsule (2,000 Units total) by mouth daily. 100 capsule 3  . denosumab (PROLIA) 60 MG/ML SOSY injection Inject 60 mg into the skin every 6 (six) months.    . diltiazem (CARTIA XT) 240 MG 24 hr capsule Take 1 capsule (240 mg total) by mouth daily. 90 capsule 3  . nebivolol (BYSTOLIC) 5 MG tablet Take 1 tablet (5 mg total) by mouth daily. 90 tablet 3  . ramipril (ALTACE) 10 MG capsule Take 1 capsule (10 mg total) by mouth daily. 90 capsule 3  . Rivaroxaban (XARELTO) 15 MG TABS tablet Take 1 tablet (15 mg total) by mouth daily with supper. 90 tablet 3  . zolpidem (AMBIEN) 10 MG tablet TAKE 1 TABLET(10 MG) BY MOUTH AT BEDTIME AS NEEDED FOR SLEEP. NEED OFFICE VISIT BEFORE REFILLS WILL BE GIVEN 30 tablet 3   No facility-administered medications prior to visit.     ROS: Review of Systems  Constitutional: Negative for activity change, appetite change, chills, fatigue and unexpected weight change.  HENT: Negative for congestion, mouth sores and sinus pressure.   Eyes: Negative for visual disturbance.  Respiratory: Negative for cough and chest tightness.   Gastrointestinal: Negative for abdominal pain and nausea.  Genitourinary: Negative for difficulty urinating, frequency and vaginal pain.  Musculoskeletal: Positive for back pain. Negative for gait problem.  Skin: Negative for pallor and rash.  Neurological: Negative for dizziness, tremors, weakness, numbness and headaches.  Psychiatric/Behavioral: Negative for confusion, sleep  disturbance and suicidal ideas.    Objective:  BP 114/72 (BP Location: Left Arm, Patient Position: Sitting, Cuff Size: Normal)   Pulse 88   Temp 98.3 F (36.8 C) (Oral)   Ht 5\' 2"  (1.575 m)   Wt 126 lb (57.2 kg)   SpO2 98%   BMI 23.05 kg/m   BP Readings from Last 3 Encounters:  11/01/18 114/72  08/07/18 124/70  02/22/18 120/80    Wt Readings from Last 3 Encounters:  11/01/18 126 lb (57.2 kg)  08/07/18 126 lb (57.2 kg)  02/22/18 120 lb (54.4 kg)    Physical Exam Constitutional:      General: She is not in acute distress.    Appearance: She is well-developed.  HENT:     Head: Normocephalic.     Right Ear: External ear normal.     Left Ear: External ear normal.     Nose: Nose normal.  Eyes:     General:        Right eye: No discharge.        Left eye: No discharge.     Conjunctiva/sclera: Conjunctivae normal.     Pupils: Pupils are equal, round, and reactive to light.  Neck:     Musculoskeletal: Normal range of motion and neck supple.     Thyroid: No thyromegaly.     Vascular: No JVD.     Trachea: No tracheal deviation.  Cardiovascular:     Rate and Rhythm: Normal rate and regular  rhythm.     Heart sounds: Normal heart sounds.  Pulmonary:     Effort: No respiratory distress.     Breath sounds: No stridor. No wheezing.  Abdominal:     General: Bowel sounds are normal. There is no distension.     Palpations: Abdomen is soft. There is no mass.     Tenderness: There is no abdominal tenderness. There is no guarding or rebound.  Musculoskeletal:        General: No tenderness.  Lymphadenopathy:     Cervical: No cervical adenopathy.  Skin:    Findings: No erythema or rash.  Neurological:     Cranial Nerves: No cranial nerve deficit.     Motor: No abnormal muscle tone.     Coordination: Coordination normal.     Deep Tendon Reflexes: Reflexes normal.  Psychiatric:        Behavior: Behavior normal.        Thought Content: Thought content normal.         Judgment: Judgment normal.    L flank area 6x6 cm - fery tender over muscles  Procedure Note :    Trigger Point Injection:   Indication : Focal tender area identifiable by the location without other identifiable neurologic or musculoskeletal finding or pathology.   Risks including unsuccessful procedure , bleeding, infection, bruising, skin atrophy and others were explained to the patient in detail as well as the benefits. Informed consent was obtained and signed.   Tthe patient was placed in a comfortable position.  4  points of maximum tenderness over paraspinal and trapezius muscles were marked and  the skin was prepped with Betadine and alcohol. 1 inch 25-gauge needle was used. The needle was advanced perpendicular to the skin. Each trigger point was injected with 1 mL of 2% lidocaine and 10 mg of Depo-Medrol in a usual fashion.  Band-Aids applied.   Tolerated well. Complications: None. Good pain relief following the procedure.   Lab Results  Component Value Date   WBC 6.5 08/01/2017   HGB 13.3 08/01/2017   HCT 38.9 08/01/2017   PLT 224 08/01/2017   GLUCOSE 121 (H) 08/10/2018   CHOL 163 10/15/2014   TRIG 93.0 10/15/2014   HDL 79.80 10/15/2014   LDLCALC 65 10/15/2014   ALT 14 06/16/2016   AST 21 06/16/2016   NA 140 08/10/2018   K 4.9 08/10/2018   CL 104 08/10/2018   CREATININE 1.03 (H) 08/10/2018   BUN 24 08/10/2018   CO2 22 08/10/2018   TSH 1.650 06/16/2016   INR 2.03 (H) 02/29/2012   HGBA1C 6.5 01/28/2014    Dg Chest 2 View  Result Date: 02/22/2018 CLINICAL DATA:  Cough, congestion, wheezing EXAM: CHEST - 2 VIEW COMPARISON:  04/05/2012 FINDINGS: Cardiomegaly. Mild hyperinflation of the lungs. No confluent airspace opacities or effusions. No acute bony abnormality. IMPRESSION: Cardiomegaly, hyperinflation.  No active disease. Electronically Signed   By: Charlett Nose M.D.   On: 02/22/2018 20:23    Assessment & Plan:   There are no diagnoses linked to this encounter.    No orders of the defined types were placed in this encounter.    Follow-up: No follow-ups on file.  Sonda Primes, MD

## 2018-11-01 NOTE — Assessment & Plan Note (Signed)
Risk of bleeding w/injections discussed

## 2018-11-03 ENCOUNTER — Telehealth: Payer: Self-pay

## 2018-11-03 NOTE — Telephone Encounter (Signed)
Key: [id number]

## 2018-11-15 DIAGNOSIS — H3581 Retinal edema: Secondary | ICD-10-CM | POA: Diagnosis not present

## 2018-11-26 ENCOUNTER — Other Ambulatory Visit: Payer: Self-pay | Admitting: Internal Medicine

## 2018-12-12 NOTE — Progress Notes (Addendum)
Subjective:   Selena Taylor is a 83 y.o. female who presents for Medicare Annual (Subsequent) preventive examination. I connected with patient by a telephone and verified that I am speaking with the correct person using two identifiers. Patient stated full name and DOB. Patient gave permission to continue with telephonic visit. Patient's location was at home and Nurse's location was at [place] office. Participants during this visit included patient and nurse.  Review of Systems:   Cardiac Risk Factors include: advanced age (>63men, >85 women);hypertension Sleep patterns: feels rested on waking, gets up 0-1 times nightly to void and sleeps 8 hours nightly.    Home Safety/Smoke Alarms: Feels safe in home. Smoke alarms in place.  Living environment; residence and Firearm Safety: 2-story house. Lives with son, no needs for DME, good support system Seat Belt Safety/Bike Helmet: Wears seat belt.      Objective:     Vitals: There were no vitals taken for this visit.  There is no height or weight on file to calculate BMI.  Advanced Directives 12/13/2018 03/22/2018 05/24/2016 10/07/2014 02/25/2012 02/23/2012 02/14/2012  Does Patient Have a Medical Advance Directive? Yes Yes Yes Yes Patient has advance directive, copy not in chart Patient has advance directive, copy not in chart Patient has advance directive, copy not in chart  Type of Advance Directive [place];Living will [place];Living will [place];Living will - [place];Living will - [place];Living will  Copy of [place] in Chart? No - copy requested No - copy requested No - copy requested Yes Copy requested from other (Comment) Copy requested from other (Comment) Copy requested from other (Comment)  Pre-existing out of facility DNR order (yellow form or pink MOST form) - - - - No - No    Tobacco Social History   Tobacco Use   Smoking Status Never Smoker  Smokeless Tobacco Never Used     Counseling given: Not Answered  Past Medical History:  Diagnosis Date  . Anxiety   . CHF (congestive heart failure) ([place])   . Chronic anticoagulation    on coumadin  . Endometriosis   . HTN (hypertension)   . Osteopenia   . PAF (paroxysmal atrial fibrillation) ([facility])    has been on Flecainide in the past. Stopped 02/10/11; Remains on coumadin anticoagulation; intol to Rythmol and failed DCCV; noted to be in AFlutter 5/13;  Echo 01/2011:  EF 55-60%, mild MR, mild LAE.  Myoview 6/12:  No ischemia, EF 74%  . Pericardial effusion   . Pericardial effusion 02/23/2012  . Shortness of breath   . TIA (transient ischemic attack)    Past Surgical History:  Procedure Laterality Date  . APPENDECTOMY    . CARDIOVERSION  01/14/2011   Procedure: CARDIOVERSION;  Surgeon: Deboraha Sprang, MD;  Location: [facility];  Service: Cardiovascular;  Laterality: N/A;  To be completed in Neuro OR 33 time slot 0830 12/20  . CARDIOVERSION  05/27/2011   Procedure: CARDIOVERSION;  Surgeon: Deboraha Sprang, MD;  Location: [facility];  Service: Cardiovascular;  Laterality: N/A;  . CATARACT EXTRACTION Bilateral   . PERICARDIAL WINDOW  02/25/2012   Procedure: PERICARDIAL WINDOW;  Surgeon: Grace Isaac, MD;  Location: [facility] OR;  Service: Thoracic;  Laterality: N/A;  . POLYPECTOMY    . skin cancer removal    . TEE WITHOUT CARDIOVERSION  02/25/2012   Procedure: TRANSESOPHAGEAL ECHOCARDIOGRAM (TEE);  Surgeon: Grace Isaac, MD;  Location: [facility] OR;  Service: Thoracic;  Laterality: N/A;  . TOTAL HYSTERECTOMY AND BILATERAL SALPINGOOPHERECTOMY     Family History  Problem Relation Age of Onset  . Cancer Brother        myeloma  . Heart disease Other        Father age 81   Social History   Socioeconomic History  . Marital status: Widowed    Spouse name: Not on file  . Number of children: 1  . Years of education: Not on file  . Highest education level: Not on file   Occupational History  . Occupation: retired  Engineer, productionocial Needs  . Financial resource strain: Not hard at all  . Food insecurity    Worry: Never true    Inability: Never true  . Transportation needs    Medical: No    Non-medical: No  Tobacco Use  . Smoking status: Never Smoker  . Smokeless tobacco: Never Used  Substance and Sexual Activity  . Alcohol use: Yes    Alcohol/week: 14.0 standard drinks    Types: 14 Glasses of Darriona Dehaas per week    Comment: 1-2 glasses of Gemini Beaumier nightly  . Drug use: No  . Sexual activity: Not Currently  Lifestyle  . Physical activity    Days per week: 4 days    Minutes per session: 50 min  . Stress: Not at all  Relationships  . Social connections    Talks on phone: More than three times a week    Gets together: More than three times a week    Attends religious service: More than 4 times per year    Active member of club or organization: Yes    Attends meetings of clubs or organizations: More than 4 times per year    Relationship status: Widowed  Other Topics Concern  . Not on file  Social History Narrative   HSG; Became a stewardness..   Married - 1959.Marland Kitchen.   1 son - '65; 1 daughter '60; 2 grandchildren..   Occupation: Retired..   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   End of life Care: no DNR, DNI, no futile or heroic measures.             Outpatient Encounter Medications as of 12/13/2018  Medication Sig  . acetaminophen (TYLENOL) 500 MG tablet Take 500 mg by mouth every 6 (six) hours as needed.  . Cholecalciferol (VITAMIN D3) 2000 units capsule Take 1 capsule (2,000 Units total) by mouth daily.  Marland Kitchen. denosumab (PROLIA) 60 MG/ML SOSY injection Inject 60 mg into the skin every 6 (six) months.  . diltiazem (CARTIA XT) 240 MG 24 hr capsule Take 1 capsule (240 mg total) by mouth daily.  Marland Kitchen. lidocaine (LIDODERM) 5 % Place 1-2 patches onto the skin daily. Remove & Discard patch within 12 hours or as directed by MD  . nebivolol (BYSTOLIC) 5 MG tablet Take 1  tablet (5 mg total) by mouth daily.  . ramipril (ALTACE) 10 MG capsule Take 1 capsule (10 mg total) by mouth daily.  . Rivaroxaban (XARELTO) 15 MG TABS tablet Take 1 tablet (15 mg total) by mouth daily with supper.  . zolpidem (AMBIEN) 10 MG tablet TAKE 1 TABLET(10 MG) BY MOUTH AT BEDTIME AS NEEDED FOR SLEEP   No facility-administered encounter medications on file as of 12/13/2018.     Activities of Daily Living In your present state of health, do you have any difficulty performing the following activities: 12/13/2018  Hearing? N  Vision? N  Difficulty concentrating or making decisions? N  Walking or climbing stairs? N  Dressing or bathing? N  Doing errands, shopping? N  Preparing Food and eating ? N  Using the Toilet? N  In the past six months, have you accidently leaked urine? N  Do you have problems with loss of bowel control? N  Managing your Medications? N  Managing your Finances? N  Housekeeping or managing your Housekeeping? N  Some recent data might be hidden    Patient Care Team: Plotnikov, Georgina Quint, MD as PCP - General (Internal Medicine) Duke Salvia, MD as PCP - Cardiology (Cardiology) Duke Salvia, MD as Consulting Physician (Cardiology) Venancio Poisson, MD as Consulting Physician (Dermatology)    Assessment:   This is a routine wellness examination for Bradlee. Physical assessment deferred to PCP.  Exercise Activities and Dietary recommendations Current Exercise Habits: Home exercise routine, Type of exercise: walking, Time (Minutes): 40, Frequency (Times/Week): 4, Weekly Exercise (Minutes/Week): 160, Intensity: Moderate, Exercise limited by: orthopedic condition(s)  Diet (meal preparation, eat out, water intake, caffeinated beverages, dairy products, fruits and vegetables): in general, a "healthy" diet  , well balanced. Diet education was attached to patient's AVS.  Goals    . Exercise 150 minutes per week (moderate activity)     Wants to start walking  again; 2 miles when the weather is cooler; Will start some form of weight bearing exercise to assist with osteo and blood sugar control     . Maintian current health status     Continue to exercise, eat healthy, enjoying life and church.       Fall Risk Fall Risk  11/23/2017 05/24/2016 10/07/2014  Falls in the past year? No No No   Is the patient's home free of loose throw rugs in walkways, pet beds, electrical cords, etc?   yes      Grab bars in the bathroom? yes      Handrails on the stairs?   yes      Adequate lighting?   yes  Depression Screen PHQ 2/9 Scores 11/23/2017 05/24/2016 10/07/2014  PHQ - 2 Score 0 0 0     Cognitive Function       Ad8 score reviewed for issues:  Issues making decisions: no  Less interest in hobbies / activities: no  Repeats questions, stories (family complaining): no  Trouble using ordinary gadgets (microwave, computer, phone):no  Forgets the month or year: no  Mismanaging finances: no  Remembering appts: no  Daily problems with thinking and/or memory: no Ad8 score is= 0  Immunization History  Administered Date(s) Administered  . Fluad Quad(high Dose 65+) 10/23/2018  . Influenza Split 11/18/2011  . Influenza Whole 12/08/2007, 11/18/2008, 11/17/2009  . Influenza, High Dose Seasonal PF 11/08/2012, 10/29/2013, 10/07/2014, 10/27/2015, 11/18/2016, 10/17/2017  . Pneumococcal Conjugate-13 08/14/2014  . Pneumococcal Polysaccharide-23 11/27/2002, 06/04/2016  . Td 08/11/2009  . Zoster 02/18/2009   Screening Tests Health Maintenance  Topic Date Due  . TETANUS/TDAP  08/12/2019  . INFLUENZA VACCINE  Completed  . DEXA SCAN  Completed  . PNA vac Low Risk Adult  Completed      Plan:    Reviewed health maintenance screenings with patient today and relevant education, vaccines, and/or referrals were provided.   Continue to eat heart healthy diet (full of fruits, vegetables, whole grains, lean protein, water--limit salt, fat, and sugar  intake) and increase physical activity as tolerated.  Continue doing brain stimulating activities (puzzles, reading, adult coloring books, staying  active) to keep memory sharp.   I have personally reviewed and noted the following in the patient's chart:   . Medical and social history . Use of alcohol, tobacco or illicit drugs  . Current medications and supplements . Functional ability and status . Nutritional status . Physical activity . Advanced directives . List of other physicians . Screenings to include cognitive, depression, and falls . Referrals and appointments  In addition, I have reviewed and discussed with patient certain preventive protocols, quality metrics, and best practice recommendations. A written personalized care plan for preventive services as well as general preventive health recommendations were provided to patient.     Wanda Plump, RN  12/13/2018  Medical screening examination/treatment/procedure(s) were performed by non-physician practitioner and as supervising physician I was immediately available for consultation/collaboration. I agree with above. Jacinta Shoe, MD

## 2018-12-13 ENCOUNTER — Ambulatory Visit (INDEPENDENT_AMBULATORY_CARE_PROVIDER_SITE_OTHER): Payer: Medicare Other | Admitting: *Deleted

## 2018-12-13 ENCOUNTER — Other Ambulatory Visit: Payer: Self-pay

## 2018-12-13 DIAGNOSIS — Z0000 Encounter for general adult medical examination without abnormal findings: Secondary | ICD-10-CM

## 2019-03-01 ENCOUNTER — Encounter: Payer: Self-pay | Admitting: Internal Medicine

## 2019-03-01 ENCOUNTER — Ambulatory Visit (INDEPENDENT_AMBULATORY_CARE_PROVIDER_SITE_OTHER): Payer: Medicare Other | Admitting: Internal Medicine

## 2019-03-01 DIAGNOSIS — M545 Low back pain, unspecified: Secondary | ICD-10-CM

## 2019-03-01 DIAGNOSIS — G8929 Other chronic pain: Secondary | ICD-10-CM

## 2019-03-01 MED ORDER — TIZANIDINE HCL 4 MG PO TABS: 2.0000 mg | ORAL_TABLET | Freq: Three times a day (TID) | ORAL | 1 refills | Status: DC | PRN

## 2019-03-01 NOTE — Progress Notes (Signed)
Virtual Visit via Telephone Note  I connected with Selena Taylor on 03/01/19 at  1:20 PM EST by telephone and verified that I am speaking with the correct person using two identifiers.   I discussed the limitations, risks, security and privacy concerns of performing an evaluation and management service by telephone and the availability of in person appointments. I also discussed with the patient that there may be a patient responsible charge related to this service. The patient expressed understanding and agreed to proceed.   History of Present Illness: The patient is complaining of periodic spastic pain in the lower back for over a year.  Apparently she had epidural shots that did not help.  She is been taking Tylenol.  The pain comes in spasms, very severe, unpredictable.   Observations/Objective: She sounds normal on the phone  Assessment and Plan:  See plan Follow Up Instructions:    I discussed the assessment and treatment plan with the patient. The patient was provided an opportunity to ask questions and all were answered. The patient agreed with the plan and demonstrated an understanding of the instructions.   The patient was advised to call back or seek an in-person evaluation if the symptoms worsen or if the condition fails to improve as anticipated.  I provided 12 minutes of non-face-to-face time during this encounter.   Sonda Primes, MD

## 2019-03-01 NOTE — Assessment & Plan Note (Signed)
The patient is complaining of periodic spastic pain in the lower back for over a year.  Apparently she had epidural shots that did not help.  She is been taking Tylenol.  The pain comes in spasms, very severe, unpredictable. Differential diagnosis is broad.  For now will prescribe a muscle relaxer-Zanaflex.  She can continue with as needed Tylenol.  Office visit if not better -  will need further work-up

## 2019-03-05 ENCOUNTER — Ambulatory Visit: Payer: Medicare Other | Attending: Internal Medicine

## 2019-03-05 DIAGNOSIS — Z23 Encounter for immunization: Secondary | ICD-10-CM | POA: Insufficient documentation

## 2019-03-05 NOTE — Progress Notes (Signed)
   Covid-19 Vaccination Clinic  Name:  Selena Taylor    MRN: [id number] DOB: 1935/08/13  03/05/2019  Selena Taylor was observed post Covid-19 immunization for 15 minutes without incidence. She was provided with Vaccine Information Sheet and instruction to access the V-Safe system.   Selena Taylor was instructed to call 911 with any severe reactions post vaccine: Marland Kitchen Difficulty breathing  . Swelling of your face and throat  . A fast heartbeat  . A bad rash all over your body  . Dizziness and weakness    Immunizations Administered    Name Date Dose VIS Date Route   Pfizer COVID-19 Vaccine 03/05/2019  5:32 PM 0.3 mL 01/05/2019 Intramuscular   Manufacturer: [employer], [employer]   Lot: [id number]   NDC: [address]-9

## 2019-03-16 ENCOUNTER — Telehealth: Payer: Self-pay | Admitting: Internal Medicine

## 2019-03-16 NOTE — Telephone Encounter (Signed)
    Patient calling to schedule Prolia

## 2019-03-20 NOTE — Telephone Encounter (Signed)
Appointment scheduled. Submitting for insurance verification.

## 2019-03-22 ENCOUNTER — Other Ambulatory Visit: Payer: Self-pay | Admitting: Internal Medicine

## 2019-03-22 NOTE — Telephone Encounter (Signed)
Check [place] registry last filled 02/20/2019. MD is out of the office pls advise.Marland KitchenRaechel Chute

## 2019-03-30 ENCOUNTER — Ambulatory Visit: Payer: Medicare Other | Attending: Internal Medicine

## 2019-03-30 DIAGNOSIS — Z23 Encounter for immunization: Secondary | ICD-10-CM

## 2019-03-30 NOTE — Progress Notes (Signed)
   Covid-19 Vaccination Clinic  Name:  YISELLE BABICH    MRN: [id number] DOB: [birth year]  03/30/2019  Ms. Kama was observed post Covid-19 immunization for 15 minutes without incident. She was provided with Vaccine Information Sheet and instruction to access the V-Safe system.   Ms. Havlicek was instructed to call 911 with any severe reactions post vaccine: Marland Kitchen Difficulty breathing  . Swelling of face and throat  . A fast heartbeat  . A bad rash all over body  . Dizziness and weakness   Immunizations Administered    Name Date Dose VIS Date Route   Pfizer COVID-19 Vaccine 03/30/2019  3:53 PM 0.3 mL 01/05/2019 Intramuscular   Manufacturer: [employer], [employer]   Lot: [id number]   NDC: [address]-2

## 2019-04-09 ENCOUNTER — Other Ambulatory Visit: Payer: Self-pay | Admitting: Internal Medicine

## 2019-04-12 ENCOUNTER — Telehealth: Payer: Self-pay

## 2019-04-12 NOTE — Telephone Encounter (Signed)
New message    The patient calling C/o back pain for over a year taken a maximum of Tylenol on a heating pad each day.   Looking for any suggestion.

## 2019-04-12 NOTE — Telephone Encounter (Signed)
OV made for Monday

## 2019-04-16 ENCOUNTER — Other Ambulatory Visit: Payer: Self-pay

## 2019-04-16 ENCOUNTER — Ambulatory Visit (INDEPENDENT_AMBULATORY_CARE_PROVIDER_SITE_OTHER): Payer: Medicare Other | Admitting: *Deleted

## 2019-04-16 ENCOUNTER — Ambulatory Visit: Payer: Medicare Other | Admitting: Family

## 2019-04-16 DIAGNOSIS — M810 Age-related osteoporosis without current pathological fracture: Secondary | ICD-10-CM | POA: Diagnosis not present

## 2019-04-16 MED ORDER — DENOSUMAB 60 MG/ML ~~LOC~~ SOSY
60.0000 mg | PREFILLED_SYRINGE | Freq: Once | SUBCUTANEOUS | Status: AC
  Administered 2019-04-16: 60 mg via SUBCUTANEOUS

## 2019-04-16 NOTE — Progress Notes (Signed)
Pls cosign for prolia inj../lmb 

## 2019-04-17 ENCOUNTER — Ambulatory Visit: Payer: Medicare Other | Admitting: Family Medicine

## 2019-04-17 ENCOUNTER — Encounter: Payer: Self-pay | Admitting: Family Medicine

## 2019-04-17 VITALS — BP 138/80 | HR 94 | Ht 62.0 in | Wt 125.4 lb

## 2019-04-17 DIAGNOSIS — M545 Low back pain, unspecified: Secondary | ICD-10-CM

## 2019-04-17 DIAGNOSIS — G8929 Other chronic pain: Secondary | ICD-10-CM | POA: Diagnosis not present

## 2019-04-17 NOTE — Patient Instructions (Addendum)
Thank you for coming in today. Plan for MRI.  Recheck with me following MRI.  Use heating pad and TENS unit.  OK to use voltaren gel on the back as needed. Over the counter medicine.  Ok to use menthol containing products.  Ok to also try Capsaicin cream.  Over the counter CBD cream or oil topical may help some.   Likely plan for facet injection following MRI.   TENS UNIT: This is helpful for muscle pain and spasm.   Search and Purchase a TENS 7000 2nd edition at  www.tenspros.com or www.Amazon.com It should be less than $30.     TENS unit instructions: Do not shower or bathe with the unit on Turn the unit off before removing electrodes or batteries If the electrodes lose stickiness add a drop of water to the electrodes after they are disconnected from the unit and place on plastic sheet. If you continued to have difficulty, call the TENS unit company to purchase more electrodes. Do not apply lotion on the skin area prior to use. Make sure the skin is clean and dry as this will help prolong the life of the electrodes. After use, always check skin for unusual red areas, rash or other skin difficulties. If there are any skin problems, does not apply electrodes to the same area. Never remove the electrodes from the unit by pulling the wires. Do not use the TENS unit or electrodes other than as directed. Do not change electrode placement without consultating your therapist or physician. Keep 2 fingers with between each electrode. Wear time ratio is 2:1, on to off times.    For example on for 30 minutes off for 15 minutes and then on for 30 minutes off for 15 minutes      Facet Syndrome  Facet syndrome is a condition in which joints (facet joints) that connect the bones of the spine (vertebrae) become damaged. Facet joints help the spine move, and they wear down (degenerate) or become inflamed as a person gets older. This can cause pain and stiffness in the neck (cervical facet  syndrome) or in the lower back (lumbar facet syndrome). When a facet joint becomes damaged, a vertebra may slip forward, out of its normal place in the spine. Damage to a facet joint can also damage nerves near the spine, which can cause tingling or weakness in the arms or legs. Facet syndrome can make it difficult to turn the head or bend backward without pain. This condition usually gets worse over time. What are the causes? Common causes of this condition include:  Age-related inflammation of the facet joints (arthritis) that may create extra bone on the joint surface (bone spurs).  Age-related decrease in space between the vertebrae (disk degeneration and cartilage degeneration).  Repetitive stress on the spine, such as repetitive twisting of the back.  Injury to the back or neck.  Poor posture.  Being overweight or obese. What increases the risk? The following factors may make you more likely to develop this condition:  Playing contact sports.  Doing activities or sports that involve repetitive twisting motions or repetitive heavy lifting.  Having poor back strength and flexibility.  Having another back or spine condition, such as scoliosis. What are the signs or symptoms? Symptoms of facet syndrome may include:  An ache in the neck or lower back. This may get worse when you twist or arch your back, or when you look up.  Stiffness in the neck or lower back.  Numbness,  tingling, or weakness in the arms or legs. Symptoms of cervical facet syndrome may include:  Headache.  Pain in the back of the head.  Pain in the shoulder blades. Symptoms of lumbar facet syndrome may include pain in any of the following areas:  Groin.  Thighs.  Lower back.  Buttocks.  Hips. How is this diagnosed? This condition may be diagnosed based on:  Your symptoms.  Your medical history.  A physical exam.  Imaging tests, such as: ? X-rays. ? MRI.  A procedure in which  medicines to numb the area (local anesthetic) and medicines to reduce inflammation (steroids) are injected into your affected joint (facet joint block). This helps to diagnose and may relieve pain. How is this treated? This condition may be treated by:  Stopping or modifying activities that make your condition worse.  Taking medicines that help reduce pain and inflammation.  Having steroid injections to help reduce severe pain.  Doing physical therapy.  Following a weight-control plan.  Having a procedure called radiofrequency ablation. This is a surgical procedure that uses high-frequency radio waves to block signals from affected nerves.  Having surgery to stabilize your spine or to take pressure off your nerves. This is rare. Follow these instructions at home: Medicines  Take over-the-counter and prescription medicines only as told by your health care provider.  Ask your health care provider if the medicine prescribed to you: ? Requires you to avoid driving or using heavy machinery. ? Can cause constipation. You may need to take actions to prevent or treat constipation, such as:  Drink enough fluid to keep your urine pale yellow.  Take over-the-counter or prescription medicines.  Eat foods that are high in fiber, such as beans, whole grains, and fresh fruits and vegetables.  Limit foods that are high in fat and processed sugars, such as fried or sweet foods. Activity  Rest your neck and back as told by your health care provider.  Return to your normal activities as told by your health care provider. Ask your health care provider what activities are safe for you.  If physical therapy was prescribed, do exercises as told by your health care provider. General instructions   Do not use any products that contain nicotine or tobacco, such as cigarettes, e-cigarettes, and chewing tobacco. These can delay healing. If you need help quitting, ask your health care provider.  Use  good posture throughout your daily activities. Good posture means that your spine is in its natural S-curve position (your spine is neutral), your shoulders are pulled back slightly, and your head is not forward.  Maintain a healthy weight. Follow instructions from your health care provider for weight control. These may include dietary restrictions.  Keep all follow-up visits as told by your health care provider. This is important. Contact a health care provider if you have:  Symptoms that get worse or do not improve in 2-4 weeks of treatment.  New symptoms. Get help right away if you:  Have numbness or weakness in any part of your body.  Lose control over your bladder or bowel functions. Summary  Facet syndrome is a condition in which joints (facet joints) that connect the bones of the spine (vertebrae) become damaged. This can cause pain and stiffness in the neck (cervical facet syndrome) or in the lower back (lumbar facet syndrome).  Rest your neck and back as told by your health care provider.  If physical therapy was prescribed, do exercises as told by your health care  provider.  Take over-the-counter and prescription medicines only as told by your health care provider.  Contact a health care provider if you have symptoms that get worse or do not improve in 2-4 weeks of treatment, or if you have new symptoms. This information is not intended to replace advice given to you by your health care provider. Make sure you discuss any questions you have with your health care provider. Document Revised: 12/20/2017 Document Reviewed: 12/25/2017 Elsevier Patient Education  2020 [employer].

## 2019-04-17 NOTE — Progress Notes (Signed)
Subjective:    CC: Low back pain  I, Molly Weber, LAT, ATC, am serving as scribe for Dr. Clementeen Graham.  HPI: Pt is a 84 y/o female presenting w/ c/o low back pain x one year.  She states that about 1.5 years ago she fell and hit her lower back on her dresser but otherwise cannot think of any MOI.  She reports having low back spasms.  She locates her pain to her L T/L junction.  She rates her low back pain at a 7/10 and describes her pain as sharp .  Radiating pain: No LE numbness/tingling: No LE weakness: No Aggravating factors: Vacuum; Working in the yard/raking leaves; L trunk rotation Treatments tried: heat, Tylenol, IcyHot  Diagnostic imaging: L-spine XR- 02/17/18  Pertinent review of Systems: No fevers or chills  Relevant historical information: Hypertension atrial fibrillation on blood thinners   Objective:    Vitals:   04/17/19 1017  BP: 138/80  Pulse: 94  SpO2: 99%   General: Well Developed, well nourished, and in no acute distress.   MSK:  L-spine: Normal-appearing Nontender spinal midline. Tender palpation lumbar paraspinal musculature.  Paraspinal muscles are rigid on the left side. Lumbar motion slightly limited flexion otherwise normal. Lower extremity strength reflexes and sensation are equal normal throughout distally.  Lab and Radiology Results  EXAM: LUMBAR SPINE - 2-3 VIEW  COMPARISON:  12/27/2011  FINDINGS: Grade 1 anterolisthesis L4 on L5. Vertebral body heights are normal. Moderate-to-marked degenerative change L2-L3 and L3-L4 with mild degenerative change at L5-S1.  IMPRESSION: 1. No acute osseous abnormality 2. Grade 1 anterolisthesis L4 on L5. Multiple level degenerative changes, most marked at L2-L3 and L3-L4.   Electronically Signed   By: Jasmine Pang M.D.   On: 02/17/2018 19:24  I, Clementeen Graham, personally (independently) visualized and performed the interpretation of the images attached in this note.   Impression  and Recommendations:    Assessment and Plan: 84 y.o. female with chronic left-sided low back pain.  Patient does have degenerative changes seen on L-spine x-ray from January.  She has had pretty good trial of conservative management with some home exercise program and some limited physical therapy in the past.  At this point we discussed her options.  Plan for MRI L-spine for potential facet injection planning.  Discussed possibility of reresuming physical therapy.  She is a bit dubious of this at this time.  Recheck following MRI.  PDMP not reviewed this encounter. Orders Placed This Encounter  Procedures  . MR Lumbar Spine Wo Contrast    Standing Status:   Future    Standing Expiration Date:   06/16/2020    Order Specific Question:   ** REASON FOR EXAM (FREE TEXT)    Answer:   eval chronic left low back pain    Order Specific Question:   What is the patient's sedation requirement?    Answer:   No Sedation    Order Specific Question:   Does the patient have a pacemaker or implanted devices?    Answer:   No    Order Specific Question:   Preferred imaging location?    Answer:   [facility] (table limit-350lbs)    Order Specific Question:   Radiology Contrast Protocol - do NOT remove file path    Answer:   \\[email or web address]   No orders of the defined types were placed in this encounter.   Discussed warning signs or symptoms. Please see discharge instructions. Patient expresses understanding.  The above documentation has been reviewed and is accurate and complete Lynne Leader

## 2019-04-22 ENCOUNTER — Other Ambulatory Visit: Payer: Self-pay | Admitting: Internal Medicine

## 2019-05-02 ENCOUNTER — Telehealth: Payer: Self-pay

## 2019-05-02 NOTE — Telephone Encounter (Signed)
New message    The patient calling wanted to know if Dr. Posey Rea will take her son on as a patient

## 2019-05-03 NOTE — Telephone Encounter (Signed)
Ok Thx 

## 2019-05-03 NOTE — Telephone Encounter (Signed)
Called pt there was no answer LMOM MD ok to see her son. Please call to set-up MD first available appt.Marland KitchenRaechel Chute

## 2019-05-14 ENCOUNTER — Telehealth: Payer: Self-pay | Admitting: Family Medicine

## 2019-05-14 NOTE — Telephone Encounter (Signed)
Can you schedule patient a follow up for after May 5th to discuss results

## 2019-05-14 NOTE — Telephone Encounter (Signed)
Patient called to let Dr Denyse Amass know that she is scheduled for her MRI on 05/28/2019.

## 2019-05-14 NOTE — Telephone Encounter (Signed)
Excellent.  Recommend scheduling follow-up with me anytime on or after May 5.

## 2019-05-14 NOTE — Telephone Encounter (Signed)
Patient will call back to schedule an appointment. 

## 2019-05-28 ENCOUNTER — Ambulatory Visit
Admission: RE | Admit: 2019-05-28 | Discharge: 2019-05-28 | Disposition: A | Discharge status: Home / Self Care | Payer: Medicare Other | Source: Ambulatory Visit | Attending: Family Medicine | Admitting: Family Medicine

## 2019-05-28 ENCOUNTER — Other Ambulatory Visit: Payer: Self-pay

## 2019-05-28 DIAGNOSIS — G8929 Other chronic pain: Secondary | ICD-10-CM

## 2019-05-28 DIAGNOSIS — M545 Low back pain, unspecified: Secondary | ICD-10-CM

## 2019-05-28 DIAGNOSIS — M48061 Spinal stenosis, lumbar region without neurogenic claudication: Secondary | ICD-10-CM | POA: Diagnosis not present

## 2019-05-28 IMAGING — MR MR LUMBAR SPINE W/O CM
5 series · 45 of 48 positions shown · non-contrast
Comparison: Lumbar radiographs [DATE]

CLINICAL DATA: Chronic low back pain.

EXAM:
MRI LUMBAR SPINE WITHOUT CONTRAST
TECHNIQUE: Multiplanar, multisequence MR imaging of the lumbar spine was
performed. No intravenous contrast was administered.

[Series 3: tirm sag · sagittal · 4.0mm · 0.55mm/px · 5 of 13 slices shown]
[im 1/13]
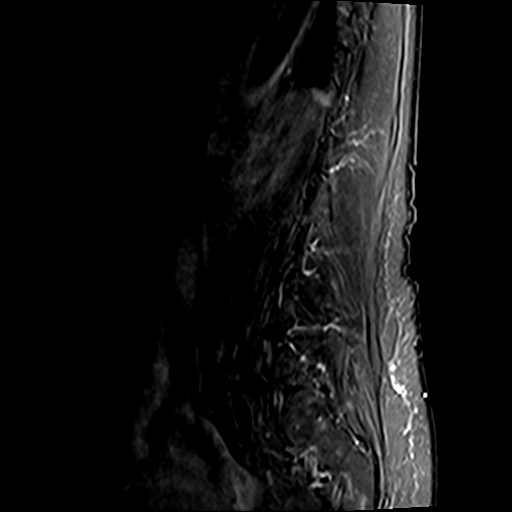
[im 4/13]
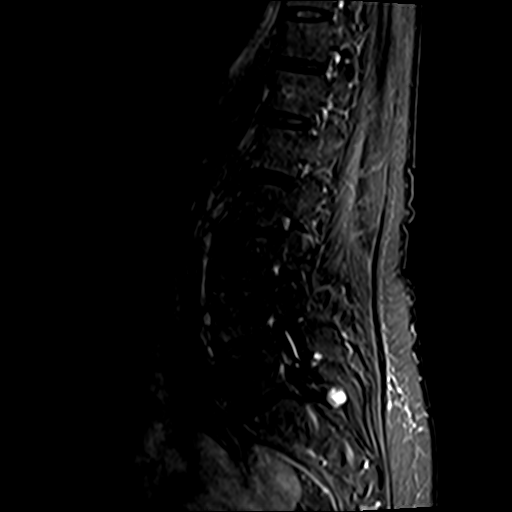
[im 7/13]
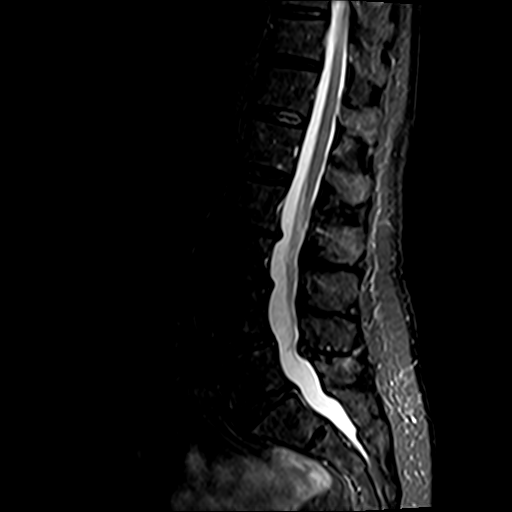
[im 10/13]
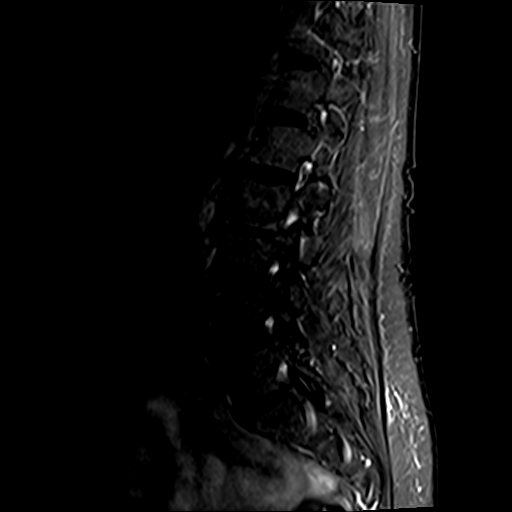
[im 13/13]
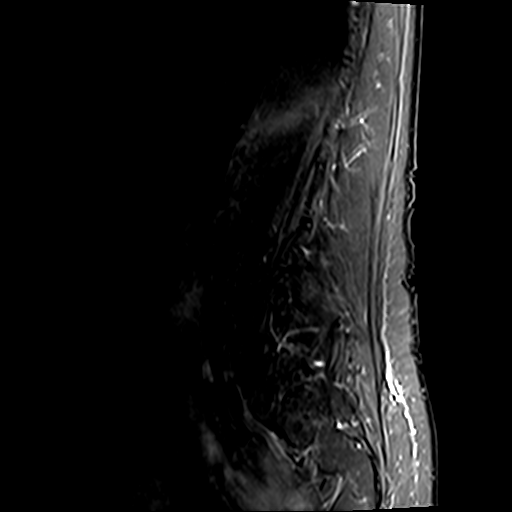

[Series 4: T2 · sagittal · 4.0mm · 0.88mm/px · 5 of 13 slices shown (1 of 2)]
[im 1/13]
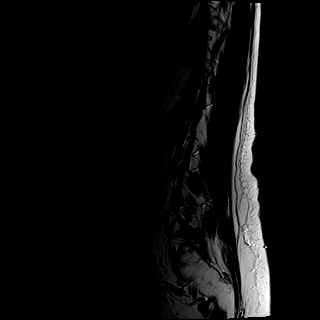
[im 4/13]
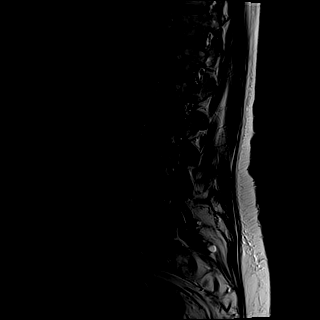
[im 7/13]
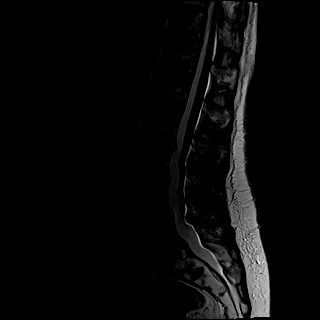
[im 10/13]
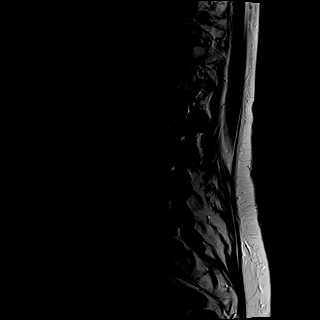
[im 13/13]
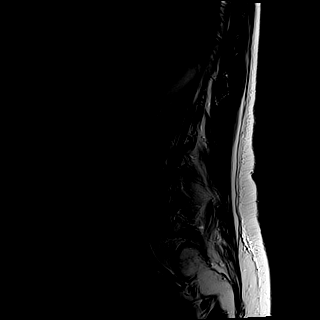

[Series 5: T1 · sagittal · 4.0mm · 0.88mm/px · 6 of 13 slices shown (1 of 2)]
[im 1/13]
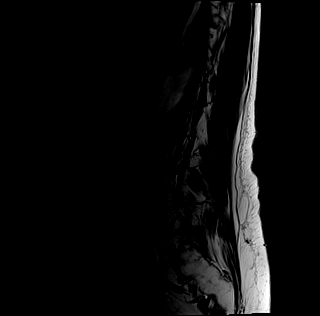
[im 3/13]
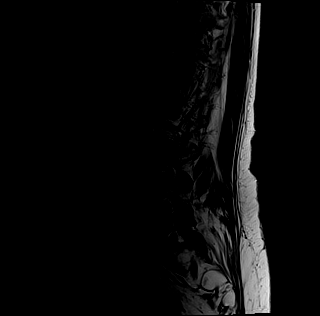
[im 5/13]
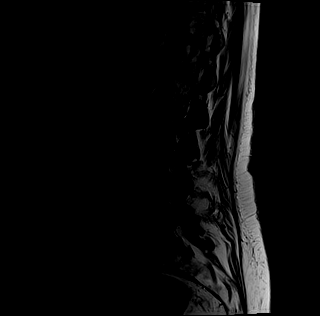
[im 8/13]
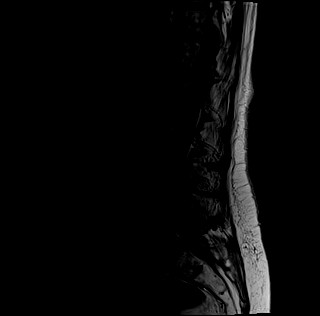
[im 10/13]
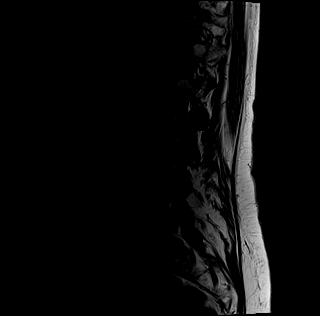
[im 13/13]
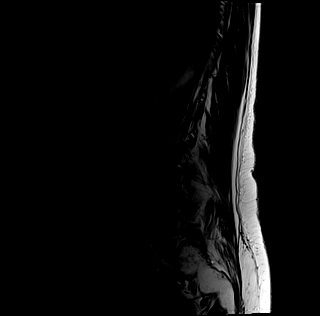

[Series 6: T1 · axial · 4.0mm · 0.78mm/px · z∈[-146,+59]mm · 13 of 37 slices shown (2 of 2)]
[im 1/37]
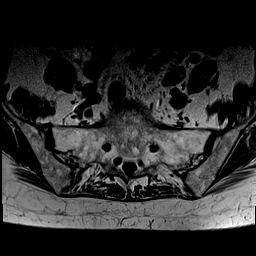
[im 3/37]
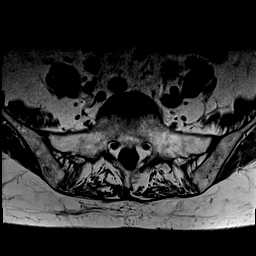
[im 5/37]
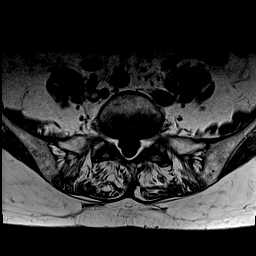
[im 8/37]
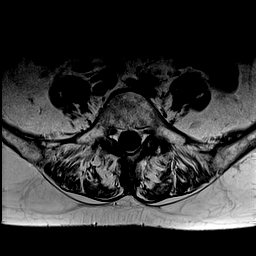
[im 10/37]
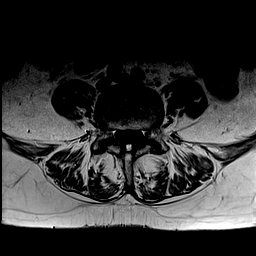
[im 13/37]
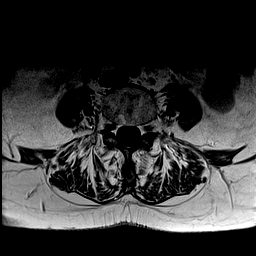
[im 15/37]
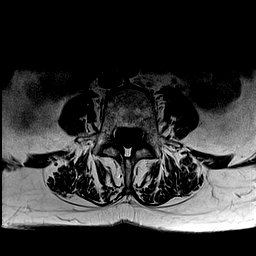
[im 17/37]
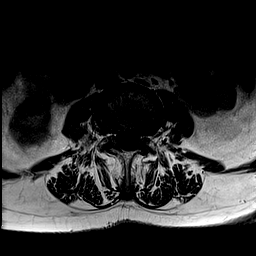
[im 20/37]
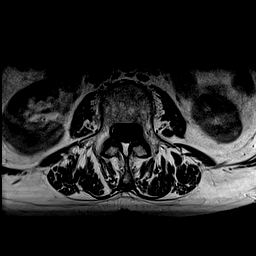
[im 22/37]
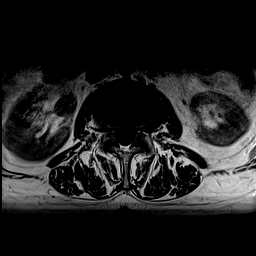
[im 27/37]
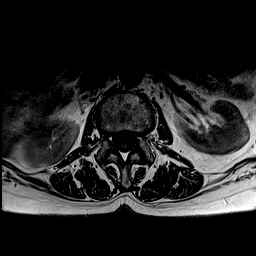
[im 32/37]
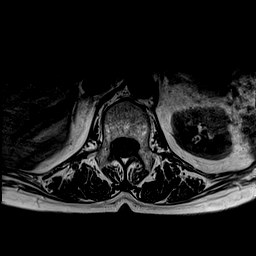
[im 37/37]
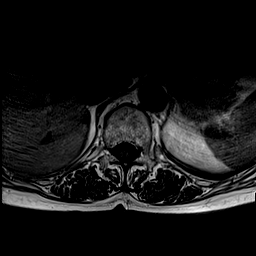

[Series 7: T2 · axial · 4.0mm · 0.78mm/px · z∈[-146,+59]mm · 16 of 37 slices shown (2 of 2)]
[im 1/37]
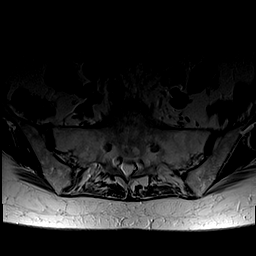
[im 3/37]
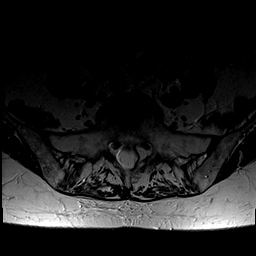
[im 5/37]
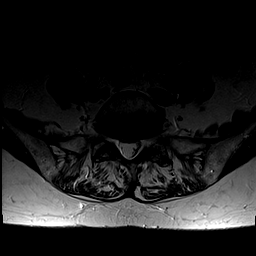
[im 8/37]
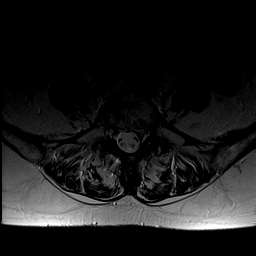
[im 10/37]
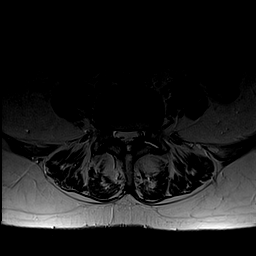
[im 13/37]
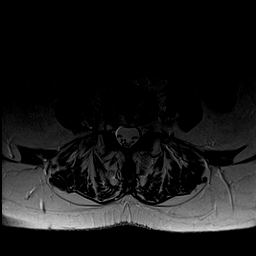
[im 15/37]
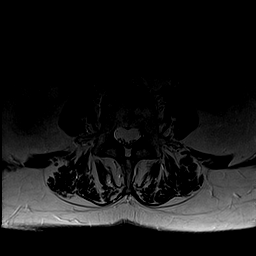
[im 17/37]
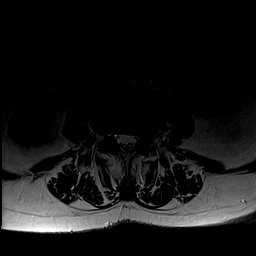
[im 20/37]
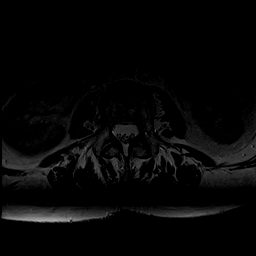
[im 22/37]
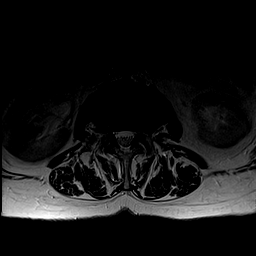
[im 25/37]
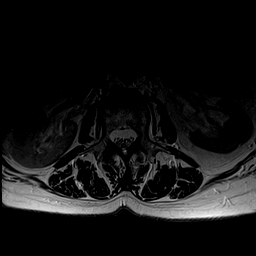
[im 27/37]
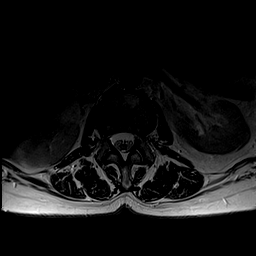
[im 29/37]
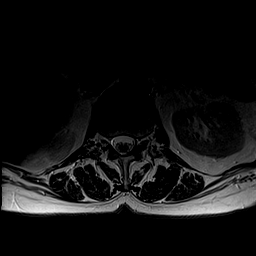
[im 32/37]
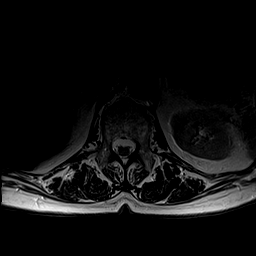
[im 34/37]
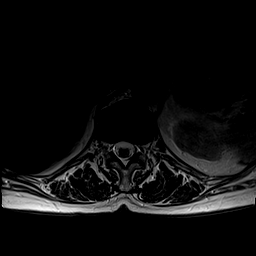
[im 37/37]
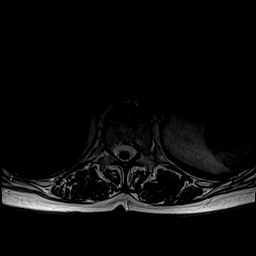

[45 of 48 positions shown; findings below may reference images not displayed]

FINDINGS: Segmentation: There are five lumbar type vertebral bodies. The last
full intervertebral disc space is labeled L5-S1. This correlates
with the radiographs.

Alignment: Normal overall alignment. Very slight degenerative
anterolisthesis of L3 and L4.

Vertebrae: Endplate reactive changes but no worrisome bone lesions
or fractures.

Conus medullaris and cauda equina: Conus extends to the L2 level.
Conus and cauda equina appear normal.

Paraspinal and other soft tissues: No significant findings.

Disc levels:

T12-L1: No significant findings.

L1-2: Mild facet disease but no disc protrusions, spinal or
foraminal stenosis.

L2-3: Moderate degenerative disc disease with a bulging degenerated
annulus and osteophytic ridging. There is also moderate facet
disease and the combination contributes to mild to moderate
bilateral lateral recess stenosis. Mild foraminal encroachment
bilaterally also.

L3-4: Advanced degenerative disc disease and mild facet disease.
Bulging annulus with flattening of the ventral thecal sac. There is
also mild foraminal encroachment bilaterally.

L4-5: Bulging and slightly uncovered disc with slight flattening of
the ventral thecal sac. There is also moderate facet disease and
slight thickening and buckling of the ligamentum flavum third no
significant spinal stenosis. Early spinal and mild bilateral lateral
recess stenosis. No foraminal stenosis.

L5-S1: Moderate to advanced facet disease and slight diffuse bulging
annulus but no significant spinal or foraminal stenosis.
IMPRESSION: 1. Degenerative lumbar spondylosis with multilevel disc disease and
facet disease.
2. Mild to moderate bilateral lateral recess stenosis and mild
bilateral foraminal encroachment at L2-3.
3. Mild bilateral foraminal encroachment at L3-4.
4. Early spinal and mild bilateral lateral recess stenosis at L4-5.

## 2019-05-28 NOTE — Progress Notes (Signed)
MRI lumbar spine does show some arthritis in the lower part of the low back of the facet joints.  This is worse at L5-S1 and are probably the cause of your pain.  This would be a good target for facet injection.  We will discuss this further during your follow-up visit with me on the fourth.

## 2019-05-29 ENCOUNTER — Encounter: Payer: Self-pay | Admitting: Family Medicine

## 2019-05-29 ENCOUNTER — Ambulatory Visit: Payer: Medicare Other | Admitting: Family Medicine

## 2019-05-29 ENCOUNTER — Other Ambulatory Visit: Payer: Self-pay

## 2019-05-29 VITALS — BP 130/88 | HR 87 | Ht 62.0 in | Wt 124.4 lb

## 2019-05-29 DIAGNOSIS — G8929 Other chronic pain: Secondary | ICD-10-CM

## 2019-05-29 DIAGNOSIS — M545 Low back pain, unspecified: Secondary | ICD-10-CM

## 2019-05-29 NOTE — Progress Notes (Signed)
I, Christoper Fabian, LAT, ATC, am serving as scribe for Dr. Clementeen Graham.  Selena Taylor is a 84 y.o. female who presents to [facility] today for f/u of L-sided low back pain and L-spine MRI review.  She was last seen by Dr. Denyse Amass on 04/17/19 and was advised to use TENs and Voltaren gel.  She was referred for an L-spine MRI that she had on 05/28/19.  Since her last visit, pt reports that her symptoms are the same.  She did not use the TENs unit due to her fear of atrial fibrillation.  She con't to use heat which helps.  Diagnostic imaging: L-spine XR- 02/17/18; L-spine MRI- 05/28/19  Pertinent review of systems: No fevers or chills  Relevant historical information: Atrial fibrillation currently taking Xarelto   Exam:  BP 130/88 (BP Location: Right Arm, Patient Position: Sitting, Cuff Size: Normal)   Pulse 87   Ht 5\' 2"  (1.575 m)   Wt 124 lb 6.4 oz (56.4 kg)   SpO2 98%   BMI 22.75 kg/m  General: Well Developed, well nourished, and in no acute distress.   MSK: L-spine normal-appearing nontender to midline.  Mildly tender palpation left lumbar paraspinal musculature.  Decreased lumbar motion.    Lab and Radiology Results No results found for this or any previous visit (from the past 72 hour(s)). MR Lumbar Spine Wo Contrast  Result Date: 05/28/2019 CLINICAL DATA:  Chronic low back pain. EXAM: MRI LUMBAR SPINE WITHOUT CONTRAST TECHNIQUE: Multiplanar, multisequence MR imaging of the lumbar spine was performed. No intravenous contrast was administered. COMPARISON:  Lumbar radiographs 02/17/2018 FINDINGS: Segmentation: There are five lumbar type vertebral bodies. The last full intervertebral disc space is labeled L5-S1. This correlates with the radiographs. Alignment: Normal overall alignment. Very slight degenerative anterolisthesis of L3 and L4. Vertebrae: Endplate reactive changes but no worrisome bone lesions or fractures. Conus medullaris and cauda equina: Conus  extends to the L2 level. Conus and cauda equina appear normal. Paraspinal and other soft tissues: No significant findings. Disc levels: T12-L1: No significant findings. L1-2: Mild facet disease but no disc protrusions, spinal or foraminal stenosis. L2-3: Moderate degenerative disc disease with a bulging degenerated annulus and osteophytic ridging. There is also moderate facet disease and the combination contributes to mild to moderate bilateral lateral recess stenosis. Mild foraminal encroachment bilaterally also. L3-4: Advanced degenerative disc disease and mild facet disease. Bulging annulus with flattening of the ventral thecal sac. There is also mild foraminal encroachment bilaterally. L4-5: Bulging and slightly uncovered disc with slight flattening of the ventral thecal sac. There is also moderate facet disease and slight thickening and buckling of the ligamentum flavum third no significant spinal stenosis. Early spinal and mild bilateral lateral recess stenosis. No foraminal stenosis. L5-S1: Moderate to advanced facet disease and slight diffuse bulging annulus but no significant spinal or foraminal stenosis. IMPRESSION: 1. Degenerative lumbar spondylosis with multilevel disc disease and facet disease. 2. Mild to moderate bilateral lateral recess stenosis and mild bilateral foraminal encroachment at L2-3. 3. Mild bilateral foraminal encroachment at L3-4. 4. Early spinal and mild bilateral lateral recess stenosis at L4-5. Electronically Signed   By: 02/19/2018 M.D.   On: 05/28/2019 14:57   I, 07/28/2019, personally (independently) visualized and performed the interpretation of the images attached in this note.     Assessment and Plan: 84 y.o. female with left-sided low back pain.  Patient failed conservative management.  MRI does show moderate to severe facet arthritis  at L5-S1.  Left facet joint does look particularly bad per my interpretation today.  Plan for targeted facet injection at left L5-S1  facet joint.  Discussed that if patient has temporary relief of pain could proceed with facet medial branch block and ablation.  Also spent time discussing anticoagulation.  She takes Eliquis for atrial fibrillation.  She will have to stop this medication prior to facet joint injection.  Advised patient stop 24 hours prior to injection and restart 96 hours after injection.  Advised that there is obviously some risk of stroke during the window however it is quite small and at this point that the patient and I think the risk is worth the benefit of pain control.    Orders Placed This Encounter  Procedures  . DG INJECT DIAG/THERA/INC NEEDLE/CATH/PLC EPI/LUMB/SAC W/IMG    Standing Status:   Future    Standing Expiration Date:   07/28/2020    Order Specific Question:   Reason for Exam (SYMPTOM  OR DIAGNOSIS REQUIRED)    Answer:   L5-S1 facet. On xeralto advised stop xeralto 24 hours prior to injection and restart 96 after.    Order Specific Question:   Preferred Imaging Location?    Answer:   [id number]    Order Specific Question:   Radiology Contrast Protocol - do NOT remove file path    Answer:   \\[email or web address]   No orders of the defined types were placed in this encounter.    Discussed warning signs or symptoms. Please see discharge instructions. Patient expresses understanding.   The above documentation has been reviewed and is accurate and complete Lynne Leader  Total encounter time 30 minutes including charting time date of service. Discussed MRI results facet injection planning and anticoagulation as above.

## 2019-05-29 NOTE — Patient Instructions (Addendum)
Thank you for coming in today.  Plan for facet joint injection.  STOP Xeralto the day prior and restart it 96 hours after the injection.  Let me know how you feel after facet injection.  Keep me updated.    Facet Joint Block The facet joints connect the bones of the spine (vertebrae). They make it possible for you to bend, twist, and make other movements with your spine. They also keep you from bending too far, twisting too far, and making other extreme movements. A facet joint block is a procedure in which a numbing medicine (anesthetic) is injected into a facet joint. In many cases, an anti-inflammatory medicine (steroid) is also injected. A facet joint block may be done:  To diagnose neck or back pain. If the pain gets better after a facet joint block, it means the pain is probably coming from the facet joint. If the pain does not get better, it means the pain is probably not coming from the facet joint.  To relieve neck or back pain that is caused by an inflamed facet joint. A facet joint block is only done to relieve pain if the pain does not improve with other methods, such as medicine, exercise programs, and physical therapy. Tell a health care provider about:  Any allergies you have.  All medicines you are taking, including vitamins, herbs, eye drops, creams, and over-the-counter medicines.  Any problems you or family members have had with anesthetic medicines.  Any blood disorders you have.  Any surgeries you have had.  Any medical conditions you have or have had.  Whether you are pregnant or may be pregnant. What are the risks? Generally, this is a safe procedure. However, problems may occur, including:  Bleeding.  Injury to a nerve near the injection site.  Pain at the injection site.  Weakness or numbness in areas controlled by nerves near the injection site.  Infection.  Temporary fluid retention.  Allergic reactions to medicines or dyes.  Injury to other  structures or organs near the injection site. What happens before the procedure? Medicines Ask your health care provider about:  Changing or stopping your regular medicines. This is especially important if you are taking diabetes medicines or blood thinners.  Taking medicines such as aspirin and ibuprofen. These medicines can thin your blood. Do not take these medicines unless your health care provider tells you to take them.  Taking over-the-counter medicines, vitamins, herbs, and supplements. Eating and drinking Follow instructions from your health care provider about eating and drinking, which may include:  8 hours before the procedure - stop eating heavy meals or foods, such as meat, fried foods, or fatty foods.  6 hours before the procedure - stop eating light meals or foods, such as toast or cereal.  6 hours before the procedure - stop drinking milk or drinks that contain milk.  2 hours before the procedure - stop drinking clear liquids. Staying hydrated Follow instructions from your health care provider about hydration, which may include:  Up to 2 hours before the procedure - you may continue to drink clear liquids, such as water, clear fruit juice, black coffee, and plain tea. General instructions  Do not use any products that contain nicotine or tobacco for at least 4-6 weeks before the procedure. These products include cigarettes, e-cigarettes, and chewing tobacco. If you need help quitting, ask your health care provider.  Plan to have someone take you home from the hospital or clinic.  Ask your health care  provider: ? How your surgery site will be marked. ? What steps will be taken to help prevent infection. These may include:  Removing hair at the surgery site.  Washing skin with a germ-killing soap.  Receiving antibiotic medicine. What happens during the procedure?   You will put on a hospital gown.  You will lie on your stomach on an X-ray table. You may be  asked to lie in a different position if an injection will be made in your neck.  Machines will be used to monitor your oxygen levels, heart rate, and blood pressure.  Your skin will be cleaned.  If an injection will be made in your neck, an IV will be inserted into one of your veins. Fluids and medicine will flow directly into your body through the IV.  A numbing medicine (local anesthetic) will be applied to your skin. Your skin may sting or burn for a moment.  A video X-ray machine (fluoroscopy) will be used to find the joint. In some cases, a CT scan may be used.  A contrast dye may be injected into the facet joint area to help find the joint.  When the joint is located, an anesthetic will be injected into the joint through the needle.  Your health care provider will ask you whether you feel pain relief. ? If you feel relief, a steroid may be injected to provide pain relief for a longer period of time. ? If you do not feel relief or feel only partial relief, additional injections of an anesthetic may be made in other facet joints.  The needle will be removed.  Your skin will be cleaned.  A bandage (dressing) will be applied over each injection site. The procedure may vary among health care providers and hospitals. What happens after the procedure?  Your blood pressure, heart rate, breathing rate, and blood oxygen level will be monitored until you leave the hospital or clinic.  You will lie down and rest for a period of time. Summary  A facet joint block is a procedure in which a numbing medicine (anesthetic) is injected into a facet joint. An anti-inflammatory medicine (stereoid) may also be injected.  Follow instructions from your health care provider about medicines and eating and drinking before the procedure.  Do not use any products that contain nicotine or tobacco for at least 4-6 weeks before the procedure.  You will lie on your stomach for the procedure, but you may  be asked to lie in a different position if an injection will be made in your neck.  When the joint is located, an anesthetic will be injected into the joint through the needle. This information is not intended to replace advice given to you by your health care provider. Make sure you discuss any questions you have with your health care provider. Document Revised: 05/04/2018 Document Reviewed: 12/16/2017 Elsevier Patient Education  [place].

## 2019-05-30 ENCOUNTER — Telehealth: Payer: Self-pay | Admitting: Internal Medicine

## 2019-05-30 NOTE — Telephone Encounter (Signed)
Patient with diagnosis of afib on Xarelto 15mg  daily for anticoagulation.    Procedure: spinal injection Date of procedure: TBD  CHADS2-VASc score of 17 (age x2, sex, CHF, HTN, TIA)  SCr 1.03, CrCl 86mL/min due to age and low body weight. Pt on appropriately reduced dose of Xarelto Platelet count 224 (2019)  Would need MD input regarding clearance as we typically hold Xarelto for 3 days for spinal procedures, however pt is at elevated risk off of anticoagulation for this long due to elevated CHADS2VASc score and history of TIA.

## 2019-05-30 NOTE — Telephone Encounter (Signed)
Spoke with patient who states that she is planning to get a facet injection in the next couple weeks and was advised that she may need to hold Xarelto prior to the injection. She is calling to ask how long it should be held prior.

## 2019-05-30 NOTE — Telephone Encounter (Signed)
Pharmacy, please comment regarding holding Xarelto prior to injection. Hx of Afib. Route response to P CV DIV PREOP. Thanks!

## 2019-05-30 NOTE — Telephone Encounter (Signed)
   Pt c/o medication issue:  1. Name of Medication: Rivaroxaban (XARELTO) 15 MG TABS tablet  2. How are you currently taking this medication (dosage and times per day)? As directed  3. Are you having a reaction (difficulty breathing--STAT)? no  4. What is your medication issue? Pt wants to know if it will be safe for her to hold this medication to get an injection for pain in her back  The patient has had some back pain and she has seen both her PCP and A Sports Medicine doctor. The Sports Medicine doctor would like to do an injection for the pain, but because the patient is on blood thinners she would need to check with Dr. Graciela Husbands first. Please let the patient know what the office decides

## 2019-05-31 NOTE — Telephone Encounter (Signed)
Called and spoke with pt  abut the concerns of coming off the anticoagulation for an injection  significant symptoms, but after our conversation she is going to reach out to her PCP about Rx an oral analgesic and maybe he will be willint to Rx short course of intermittent opiates taht will not increase the risk of her bleeding

## 2019-06-01 NOTE — Telephone Encounter (Signed)
Dr. Graciela Husbands spoke directly with the patient. I will remove from preop pool.

## 2019-06-01 NOTE — Telephone Encounter (Signed)
Kathie Rhodes, please check with Bridie if she is planning to have the procedure.  What am I supposed to help her with? Thanks, AP

## 2019-06-01 NOTE — Telephone Encounter (Signed)
Pt has OV Monday to discuss

## 2019-06-04 ENCOUNTER — Ambulatory Visit (INDEPENDENT_AMBULATORY_CARE_PROVIDER_SITE_OTHER): Payer: Medicare Other | Admitting: Internal Medicine

## 2019-06-04 ENCOUNTER — Telehealth: Payer: Self-pay | Admitting: Family Medicine

## 2019-06-04 ENCOUNTER — Other Ambulatory Visit: Payer: Self-pay

## 2019-06-04 ENCOUNTER — Ambulatory Visit: Payer: Medicare Other | Admitting: Internal Medicine

## 2019-06-04 ENCOUNTER — Encounter: Payer: Self-pay | Admitting: Internal Medicine

## 2019-06-04 DIAGNOSIS — M545 Low back pain: Secondary | ICD-10-CM

## 2019-06-04 DIAGNOSIS — I10 Essential (primary) hypertension: Secondary | ICD-10-CM | POA: Diagnosis not present

## 2019-06-04 DIAGNOSIS — I4891 Unspecified atrial fibrillation: Secondary | ICD-10-CM | POA: Diagnosis not present

## 2019-06-04 DIAGNOSIS — G8929 Other chronic pain: Secondary | ICD-10-CM | POA: Diagnosis not present

## 2019-06-04 MED ORDER — TRAMADOL HCL 50 MG PO TABS: 25.0000 mg | ORAL_TABLET | Freq: Four times a day (QID) | ORAL | 0 refills | Status: DC | PRN

## 2019-06-04 NOTE — Telephone Encounter (Signed)
Pt had her MRI. Dr. Jenna Luo does NOT want her to go off of her blood thinners and does NOT want her to have an epidural as he considers the risk to high. Dr. Posey Rea has started her on Tramadol. Pt is disappointed that she cannot do more and just wanted you to know.

## 2019-06-04 NOTE — Progress Notes (Signed)
Subjective:  Patient ID: Selena Taylor, female    DOB: 1935/10/26  Age: 84 y.o. MRN: [id number]  CC: No chief complaint on file.   HPI Selena Taylor presents for severe episodic LBP - unbearable at times - worse x 2-3 mo Unable to have facet injections due to anticoagulation. F/u HTN, A fib Pain is 10/10 - using tylenol and capsacin   Outpatient Medications Prior to Visit  Medication Sig Dispense Refill  . acetaminophen (TYLENOL) 500 MG tablet Take 500 mg by mouth every 6 (six) hours as needed.    . Cholecalciferol (VITAMIN D3) 2000 units capsule Take 1 capsule (2,000 Units total) by mouth daily. 100 capsule 3  . denosumab (PROLIA) 60 MG/ML SOSY injection Inject 60 mg into the skin every 6 (six) months.    . diltiazem (CARDIZEM CD) 240 MG 24 hr capsule Take 1 capsule (240 mg total) by mouth daily. Please make yearly appt with Dr. Graciela Husbands for July for future refills. 1st attempt 90 capsule 0  . lidocaine (LIDODERM) 5 % Place 1-2 patches onto the skin daily. Remove & Discard patch within 12 hours or as directed by MD 60 patch 1  . nebivolol (BYSTOLIC) 5 MG tablet Take 1 tablet (5 mg total) by mouth daily. 90 tablet 3  . ramipril (ALTACE) 10 MG capsule Take 1 capsule (10 mg total) by mouth daily. 90 capsule 3  . Rivaroxaban (XARELTO) 15 MG TABS tablet Take 1 tablet (15 mg total) by mouth daily with supper. 90 tablet 3  . tiZANidine (ZANAFLEX) 4 MG tablet Take 0.5-1 tablets (2-4 mg total) by mouth every 8 (eight) hours as needed for muscle spasms. 60 tablet 1  . zolpidem (AMBIEN) 10 MG tablet TAKE 1 TABLET(10 MG) BY MOUTH AT BEDTIME AS NEEDED FOR SLEEP 30 tablet 3   No facility-administered medications prior to visit.    ROS: Review of Systems  Constitutional: Negative for activity change, appetite change, chills, fatigue and unexpected weight change.  HENT: Negative for congestion, mouth sores and sinus pressure.   Eyes: Negative for visual disturbance.  Respiratory: Negative for cough  and chest tightness.   Gastrointestinal: Negative for abdominal pain and nausea.  Genitourinary: Negative for difficulty urinating, frequency and vaginal pain.  Musculoskeletal: Positive for arthralgias and back pain. Negative for gait problem.  Skin: Negative for pallor and rash.  Neurological: Negative for dizziness, tremors, weakness, numbness and headaches.  Psychiatric/Behavioral: Negative for confusion and sleep disturbance.    Objective:  BP 138/76 (BP Location: Left Arm, Patient Position: Sitting, Cuff Size: Normal)   Pulse 88   Temp 98.6 F (37 C) (Oral)   Ht 5\' 2"  (1.575 m)   Wt 125 lb (56.7 kg)   SpO2 98%   BMI 22.86 kg/m   BP Readings from Last 3 Encounters:  06/04/19 138/76  05/29/19 130/88  04/17/19 138/80    Wt Readings from Last 3 Encounters:  06/04/19 125 lb (56.7 kg)  05/29/19 124 lb 6.4 oz (56.4 kg)  04/17/19 125 lb 6.4 oz (56.9 kg)    Physical Exam Constitutional:      General: She is not in acute distress.    Appearance: She is well-developed.  HENT:     Head: Normocephalic.     Right Ear: External ear normal.     Left Ear: External ear normal.     Nose: Nose normal.  Eyes:     General:        Right eye: No discharge.  Left eye: No discharge.     Conjunctiva/sclera: Conjunctivae normal.     Pupils: Pupils are equal, round, and reactive to light.  Neck:     Thyroid: No thyromegaly.     Vascular: No JVD.     Trachea: No tracheal deviation.  Cardiovascular:     Rate and Rhythm: Normal rate and regular rhythm.     Heart sounds: Normal heart sounds.  Pulmonary:     Effort: No respiratory distress.     Breath sounds: No stridor. No wheezing.  Abdominal:     General: Bowel sounds are normal. There is no distension.     Palpations: Abdomen is soft. There is no mass.     Tenderness: There is no abdominal tenderness. There is no guarding or rebound.  Musculoskeletal:        General: Tenderness present.     Cervical back: Normal range of  motion and neck supple.  Lymphadenopathy:     Cervical: No cervical adenopathy.  Skin:    Findings: No erythema or rash.  Neurological:     Cranial Nerves: No cranial nerve deficit.     Motor: No abnormal muscle tone.     Coordination: Coordination normal.     Deep Tendon Reflexes: Reflexes normal.  Psychiatric:        Behavior: Behavior normal.        Thought Content: Thought content normal.        Judgment: Judgment normal.    LS spine tender Str leg elev (-) B   Lab Results  Component Value Date   WBC 6.5 08/01/2017   HGB 13.3 08/01/2017   HCT 38.9 08/01/2017   PLT 224 08/01/2017   GLUCOSE 121 (H) 08/10/2018   CHOL 163 10/15/2014   TRIG 93.0 10/15/2014   HDL 79.80 10/15/2014   LDLCALC 65 10/15/2014   ALT 14 06/16/2016   AST 21 06/16/2016   NA 140 08/10/2018   K 4.9 08/10/2018   CL 104 08/10/2018   CREATININE 1.03 (H) 08/10/2018   BUN 24 08/10/2018   CO2 22 08/10/2018   TSH 1.650 06/16/2016   INR 2.03 (H) 02/29/2012   HGBA1C 6.5 01/28/2014    MR Lumbar Spine Wo Contrast  Result Date: 05/28/2019 CLINICAL DATA:  Chronic low back pain. EXAM: MRI LUMBAR SPINE WITHOUT CONTRAST TECHNIQUE: Multiplanar, multisequence MR imaging of the lumbar spine was performed. No intravenous contrast was administered. COMPARISON:  Lumbar radiographs 02/17/2018 FINDINGS: Segmentation: There are five lumbar type vertebral bodies. The last full intervertebral disc space is labeled L5-S1. This correlates with the radiographs. Alignment: Normal overall alignment. Very slight degenerative anterolisthesis of L3 and L4. Vertebrae: Endplate reactive changes but no worrisome bone lesions or fractures. Conus medullaris and cauda equina: Conus extends to the L2 level. Conus and cauda equina appear normal. Paraspinal and other soft tissues: No significant findings. Disc levels: T12-L1: No significant findings. L1-2: Mild facet disease but no disc protrusions, spinal or foraminal stenosis. L2-3: Moderate  degenerative disc disease with a bulging degenerated annulus and osteophytic ridging. There is also moderate facet disease and the combination contributes to mild to moderate bilateral lateral recess stenosis. Mild foraminal encroachment bilaterally also. L3-4: Advanced degenerative disc disease and mild facet disease. Bulging annulus with flattening of the ventral thecal sac. There is also mild foraminal encroachment bilaterally. L4-5: Bulging and slightly uncovered disc with slight flattening of the ventral thecal sac. There is also moderate facet disease and slight thickening and buckling of the ligamentum flavum third  no significant spinal stenosis. Early spinal and mild bilateral lateral recess stenosis. No foraminal stenosis. L5-S1: Moderate to advanced facet disease and slight diffuse bulging annulus but no significant spinal or foraminal stenosis. IMPRESSION: 1. Degenerative lumbar spondylosis with multilevel disc disease and facet disease. 2. Mild to moderate bilateral lateral recess stenosis and mild bilateral foraminal encroachment at L2-3. 3. Mild bilateral foraminal encroachment at L3-4. 4. Early spinal and mild bilateral lateral recess stenosis at L4-5. Electronically Signed   By: Rudie Meyer M.D.   On: 05/28/2019 14:57    Assessment & Plan:   There are no diagnoses linked to this encounter.   No orders of the defined types were placed in this encounter.    Follow-up: No follow-ups on file.  Sonda Primes, MD

## 2019-06-05 MED ORDER — TRAMADOL HCL 50 MG PO TABS: 25.0000 mg | ORAL_TABLET | Freq: Four times a day (QID) | ORAL | 0 refills | Status: DC | PRN

## 2019-06-05 NOTE — Assessment & Plan Note (Signed)
Severe Worse Facet inj were declined due to anticoagulation Trial of Tramadol.  Potential benefits of a short/long term opioids use as well as potential risks (i.e. addiction risk, apnea etc) and complications (i.e. Somnolence, constipation and others) were explained to the patient and were aknowledged.

## 2019-06-05 NOTE — Assessment & Plan Note (Signed)
Rate controlled On Xarelto 

## 2019-06-05 NOTE — Assessment & Plan Note (Signed)
  BP Readings from Last 3 Encounters:  06/04/19 138/76  05/29/19 130/88  04/17/19 138/80

## 2019-06-06 ENCOUNTER — Telehealth: Payer: Self-pay

## 2019-06-06 NOTE — Telephone Encounter (Signed)
Please advise 

## 2019-06-06 NOTE — Telephone Encounter (Signed)
Noted. Thx.

## 2019-06-06 NOTE — Telephone Encounter (Signed)
New message    Pt c/o medication issue:  1. Name of Medication: traMADol (ULTRAM) 50 MG tablet  2. How are you currently taking this medication (dosage and times per day)? As needed   3. Are you having a reaction (difficulty breathing--STAT)? No   4. What is your medication issue? Patient refused medication after doing research & side effect looking for another options

## 2019-06-21 ENCOUNTER — Telehealth: Payer: Self-pay | Admitting: Family Medicine

## 2019-06-21 NOTE — Telephone Encounter (Signed)
I think it is reasonable to try.  Avoid activity that makes symptoms worse.

## 2019-06-21 NOTE — Telephone Encounter (Signed)
Patient called asking if it is okay for her to start back on [place]?  Please advise.

## 2019-06-22 NOTE — Telephone Encounter (Signed)
Called and LM w/ Dr. Zollie Pee advice.

## 2019-06-26 ENCOUNTER — Ambulatory Visit: Payer: Medicare Other | Admitting: Family Medicine

## 2019-07-04 ENCOUNTER — Ambulatory Visit (INDEPENDENT_AMBULATORY_CARE_PROVIDER_SITE_OTHER): Payer: Medicare Other | Admitting: Internal Medicine

## 2019-07-04 ENCOUNTER — Encounter: Payer: Self-pay | Admitting: Internal Medicine

## 2019-07-04 ENCOUNTER — Other Ambulatory Visit: Payer: Self-pay

## 2019-07-04 DIAGNOSIS — M545 Low back pain: Secondary | ICD-10-CM

## 2019-07-04 DIAGNOSIS — G8929 Other chronic pain: Secondary | ICD-10-CM

## 2019-07-04 DIAGNOSIS — G459 Transient cerebral ischemic attack, unspecified: Secondary | ICD-10-CM | POA: Diagnosis not present

## 2019-07-04 DIAGNOSIS — F5104 Psychophysiologic insomnia: Secondary | ICD-10-CM

## 2019-07-04 MED ORDER — TRAMADOL HCL 50 MG PO TABS: 25.0000 mg | ORAL_TABLET | Freq: Four times a day (QID) | ORAL | 0 refills | Status: DC | PRN

## 2019-07-04 NOTE — Assessment & Plan Note (Signed)
Will treat low back pain

## 2019-07-04 NOTE — Assessment & Plan Note (Signed)
Worse. Selena Taylor was hesitant to use tramadol after reading potential side effects and canceled your prescription.  However, her back pain is so severe at times that she decided to try.  We had a long discussion about tramadol potential benefits and side effects including but not limited to constipation, somnolence, seizures etc.We will start with a low dose-quarter tablet.

## 2019-07-04 NOTE — Assessment & Plan Note (Signed)
No relapse.  She is on Xarelto

## 2019-07-04 NOTE — Progress Notes (Signed)
Subjective:  Patient ID: Selena Taylor, female    DOB: 10-31-1935  Age: 84 y.o. MRN: [id number]  CC: No chief complaint on file.   HPI Selena Taylor presents for a c/o LBP.  [place] was hesitant to use tramadol after reading potential side effects and canceled your prescription.  However, her back pain is so severe at times that she decided to try.  We had a long discussion about tramadol potential benefits and side effects including but not limited to constipation, somnolence, seizures etc.We will start with a low dose-quarter tablet.  Visit and discussion greater than 30 minutes.  Outpatient Medications Prior to Visit  Medication Sig Dispense Refill  . acetaminophen (TYLENOL) 500 MG tablet Take 500 mg by mouth every 6 (six) hours as needed.    . Cholecalciferol (VITAMIN D3) 2000 units capsule Take 1 capsule (2,000 Units total) by mouth daily. 100 capsule 3  . denosumab (PROLIA) 60 MG/ML SOSY injection Inject 60 mg into the skin every 6 (six) months.    . diltiazem (CARDIZEM CD) 240 MG 24 hr capsule Take 1 capsule (240 mg total) by mouth daily. Please make yearly appt with Dr. Graciela Husbands for July for future refills. 1st attempt 90 capsule 0  . lidocaine (LIDODERM) 5 % Place 1-2 patches onto the skin daily. Remove & Discard patch within 12 hours or as directed by MD 60 patch 1  . nebivolol (BYSTOLIC) 5 MG tablet Take 1 tablet (5 mg total) by mouth daily. 90 tablet 3  . ramipril (ALTACE) 10 MG capsule Take 1 capsule (10 mg total) by mouth daily. 90 capsule 3  . Rivaroxaban (XARELTO) 15 MG TABS tablet Take 1 tablet (15 mg total) by mouth daily with supper. 90 tablet 3  . zolpidem (AMBIEN) 10 MG tablet TAKE 1 TABLET(10 MG) BY MOUTH AT BEDTIME AS NEEDED FOR SLEEP 30 tablet 3  . tiZANidine (ZANAFLEX) 4 MG tablet Take 0.5-1 tablets (2-4 mg total) by mouth every 8 (eight) hours as needed for muscle spasms. (Patient not taking: Reported on 07/04/2019) 60 tablet 1   No facility-administered medications prior to  visit.    ROS: Review of Systems  Constitutional: Negative for activity change, appetite change, chills, fatigue and unexpected weight change.  HENT: Negative for congestion, mouth sores and sinus pressure.   Eyes: Negative for visual disturbance.  Respiratory: Negative for cough and chest tightness.   Gastrointestinal: Negative for abdominal pain and nausea.  Genitourinary: Negative for difficulty urinating, frequency and vaginal pain.  Musculoskeletal: Positive for back pain. Negative for gait problem.  Skin: Negative for pallor and rash.  Neurological: Negative for dizziness, tremors, weakness, numbness and headaches.  Psychiatric/Behavioral: Negative for confusion and sleep disturbance.    Objective:  BP 124/72 (BP Location: Right Arm, Patient Position: Sitting, Cuff Size: Normal)   Pulse 67   Temp 98.3 F (36.8 C) (Oral)   Ht 5\' 2"  (1.575 m)   Wt 125 lb (56.7 kg)   SpO2 99%   BMI 22.86 kg/m   BP Readings from Last 3 Encounters:  07/04/19 124/72  06/04/19 138/76  05/29/19 130/88    Wt Readings from Last 3 Encounters:  07/04/19 125 lb (56.7 kg)  06/04/19 125 lb (56.7 kg)  05/29/19 124 lb 6.4 oz (56.4 kg)    Physical Exam Constitutional:      General: She is not in acute distress.    Appearance: She is well-developed.  HENT:     Head: Normocephalic.  Right Ear: External ear normal.     Left Ear: External ear normal.     Nose: Nose normal.  Eyes:     General:        Right eye: No discharge.        Left eye: No discharge.     Conjunctiva/sclera: Conjunctivae normal.     Pupils: Pupils are equal, round, and reactive to light.  Neck:     Thyroid: No thyromegaly.     Vascular: No JVD.     Trachea: No tracheal deviation.  Cardiovascular:     Rate and Rhythm: Normal rate. Rhythm irregular.     Heart sounds: Normal heart sounds.  Pulmonary:     Effort: No respiratory distress.     Breath sounds: No stridor. No wheezing.  Abdominal:     General: Bowel  sounds are normal. There is no distension.     Palpations: Abdomen is soft. There is no mass.     Tenderness: There is no abdominal tenderness. There is no guarding or rebound.  Musculoskeletal:        General: Tenderness present.     Cervical back: Normal range of motion and neck supple.  Lymphadenopathy:     Cervical: No cervical adenopathy.  Skin:    Findings: No erythema or rash.  Neurological:     Mental Status: She is oriented to person, place, and time.     Cranial Nerves: No cranial nerve deficit.     Motor: No abnormal muscle tone.     Coordination: Coordination normal.     Deep Tendon Reflexes: Reflexes normal.  Psychiatric:        Behavior: Behavior normal.        Thought Content: Thought content normal.        Judgment: Judgment normal.    Lumbar spine is stiff and tender with range of motion Lab Results  Component Value Date   WBC 6.5 08/01/2017   HGB 13.3 08/01/2017   HCT 38.9 08/01/2017   PLT 224 08/01/2017   GLUCOSE 121 (H) 08/10/2018   CHOL 163 10/15/2014   TRIG 93.0 10/15/2014   HDL 79.80 10/15/2014   LDLCALC 65 10/15/2014   ALT 14 06/16/2016   AST 21 06/16/2016   NA 140 08/10/2018   K 4.9 08/10/2018   CL 104 08/10/2018   CREATININE 1.03 (H) 08/10/2018   BUN 24 08/10/2018   CO2 22 08/10/2018   TSH 1.650 06/16/2016   INR 2.03 (H) 02/29/2012   HGBA1C 6.5 01/28/2014    MR Lumbar Spine Wo Contrast  Result Date: 05/28/2019 CLINICAL DATA:  Chronic low back pain. EXAM: MRI LUMBAR SPINE WITHOUT CONTRAST TECHNIQUE: Multiplanar, multisequence MR imaging of the lumbar spine was performed. No intravenous contrast was administered. COMPARISON:  Lumbar radiographs 02/17/2018 FINDINGS: Segmentation: There are five lumbar type vertebral bodies. The last full intervertebral disc space is labeled L5-S1. This correlates with the radiographs. Alignment: Normal overall alignment. Very slight degenerative anterolisthesis of L3 and L4. Vertebrae: Endplate reactive changes  but no worrisome bone lesions or fractures. Conus medullaris and cauda equina: Conus extends to the L2 level. Conus and cauda equina appear normal. Paraspinal and other soft tissues: No significant findings. Disc levels: T12-L1: No significant findings. L1-2: Mild facet disease but no disc protrusions, spinal or foraminal stenosis. L2-3: Moderate degenerative disc disease with a bulging degenerated annulus and osteophytic ridging. There is also moderate facet disease and the combination contributes to mild to moderate bilateral lateral recess stenosis. Mild  foraminal encroachment bilaterally also. L3-4: Advanced degenerative disc disease and mild facet disease. Bulging annulus with flattening of the ventral thecal sac. There is also mild foraminal encroachment bilaterally. L4-5: Bulging and slightly uncovered disc with slight flattening of the ventral thecal sac. There is also moderate facet disease and slight thickening and buckling of the ligamentum flavum third no significant spinal stenosis. Early spinal and mild bilateral lateral recess stenosis. No foraminal stenosis. L5-S1: Moderate to advanced facet disease and slight diffuse bulging annulus but no significant spinal or foraminal stenosis. IMPRESSION: 1. Degenerative lumbar spondylosis with multilevel disc disease and facet disease. 2. Mild to moderate bilateral lateral recess stenosis and mild bilateral foraminal encroachment at L2-3. 3. Mild bilateral foraminal encroachment at L3-4. 4. Early spinal and mild bilateral lateral recess stenosis at L4-5. Electronically Signed   By: Rudie Meyer M.D.   On: 05/28/2019 14:57    Assessment & Plan:    Follow-up: No follow-ups on file.  Sonda Primes, MD

## 2019-07-07 ENCOUNTER — Other Ambulatory Visit: Payer: Self-pay | Admitting: Internal Medicine

## 2019-07-16 DIAGNOSIS — H52223 Regular astigmatism, bilateral: Secondary | ICD-10-CM | POA: Diagnosis not present

## 2019-07-20 DIAGNOSIS — H18513 Endothelial corneal dystrophy, bilateral: Secondary | ICD-10-CM | POA: Diagnosis not present

## 2019-07-20 DIAGNOSIS — H10412 Chronic giant papillary conjunctivitis, left eye: Secondary | ICD-10-CM | POA: Diagnosis not present

## 2019-08-07 ENCOUNTER — Encounter: Payer: Self-pay | Admitting: Internal Medicine

## 2019-08-07 ENCOUNTER — Ambulatory Visit: Payer: Medicare Other | Admitting: Internal Medicine

## 2019-08-07 ENCOUNTER — Other Ambulatory Visit: Payer: Self-pay

## 2019-08-07 VITALS — BP 120/68 | HR 74 | Ht 64.0 in | Wt 125.0 lb

## 2019-08-07 DIAGNOSIS — I4891 Unspecified atrial fibrillation: Secondary | ICD-10-CM

## 2019-08-07 DIAGNOSIS — Z79899 Other long term (current) drug therapy: Secondary | ICD-10-CM

## 2019-08-07 MED ORDER — DILTIAZEM HCL ER COATED BEADS 240 MG PO CP24: 240.0000 mg | ORAL_CAPSULE | Freq: Every day | ORAL | 12 refills | Status: DC

## 2019-08-07 MED ORDER — RIVAROXABAN 15 MG PO TABS: 15.0000 mg | ORAL_TABLET | Freq: Every day | ORAL | 12 refills | Status: DC

## 2019-08-07 MED ORDER — RAMIPRIL 10 MG PO CAPS: 10.0000 mg | ORAL_CAPSULE | Freq: Every day | ORAL | 12 refills | Status: DC

## 2019-08-07 MED ORDER — NEBIVOLOL HCL 5 MG PO TABS: 5.0000 mg | ORAL_TABLET | Freq: Every day | ORAL | 12 refills | Status: DC

## 2019-08-07 NOTE — Patient Instructions (Signed)
Medication Instructions:  Your physician recommends that you continue on your current medications as directed. Please refer to the Current Medication list given to you today.  *If you need a refill on your cardiac medications before your next appointment, please call your pharmacy*   Lab Work: CBC and BMET today If you have labs (blood work) drawn today and your tests are completely normal, you will receive your results only by: . MyChart Message (if you have MyChart) OR . A paper copy in the mail If you have any lab test that is abnormal or we need to change your treatment, we will call you to review the results.   Testing/Procedures: None ordered.    Follow-Up: At [facility], you and your health needs are our priority.  As part of our continuing mission to provide you with exceptional heart care, we have created designated Provider Care Teams.  These Care Teams include your primary Cardiologist (physician) and Advanced Practice Providers (APPs -  Physician Assistants and Nurse Practitioners) who all work together to provide you with the care you need, when you need it.  We recommend signing up for the patient portal called "MyChart".  Sign up information is provided on this After Visit Summary.  MyChart is used to connect with patients for Virtual Visits (Telemedicine).  Patients are able to view lab/test results, encounter notes, upcoming appointments, etc.  Non-urgent messages can be sent to your provider as well.   To learn more about what you can do with MyChart, go to [email or web address].    Your next appointment:   12 month(s)  The format for your next appointment:   In Person  Provider:   Steven Klein, MD     

## 2019-08-07 NOTE — Progress Notes (Signed)
Patient Care Team: Plotnikov, Georgina Quint, MD as PCP - General (Internal Medicine) Duke Salvia, MD as PCP - Cardiology (Cardiology) Duke Salvia, MD as Consulting Physician (Cardiology) Venancio Poisson, MD as Consulting Physician (Dermatology)   HPI  Selena Taylor is a 84 y.o. female Seen in followup for atrial fibrillation and pericarditis. Because of persistent symptoms she had undergone a pericardial window.  She takes Rivaroxaban      Her husband died 2014-05-29. [place] Life is much much better.  The patient denies chest pain, shortness of breath, nocturnal dyspnea, orthopnea or peripheral edema.  There have been no palpitations, lightheadedness or syncope.    Biggest issue is back pain.  It has limited her exercise.  She is interested in a supplement that contains turmeric and Resvetrol   Date Cr Hgb  6/17 0.89 13.9  5/18 1.02 13.8   7/20  1.03        On Anticoagulation;  No bleeding issues although some bruising   Past Medical History:  Diagnosis Date  . Anxiety   . CHF (congestive heart failure) (HCC)   . Chronic anticoagulation    on coumadin  . Endometriosis   . HTN (hypertension)   . Osteopenia   . PAF (paroxysmal atrial fibrillation) (HCC)    has been on Flecainide in the past. Stopped 02/10/11; Remains on coumadin anticoagulation; intol to Rythmol and failed DCCV; noted to be in AFlutter 5/13;  Echo 01/2011:  EF 55-60%, mild MR, mild LAE.  Myoview 6/12:  No ischemia, EF 74%  . Pericardial effusion   . Pericardial effusion 02/23/2012  . Shortness of breath   . TIA (transient ischemic attack)     Past Surgical History:  Procedure Laterality Date  . APPENDECTOMY    . CARDIOVERSION  01/14/2011   Procedure: CARDIOVERSION;  Surgeon: Duke Salvia, MD;  Location[facility] OR;  Service: Cardiovascular;  Laterality: N/A;  To be completed in Neuro OR 33 time slot 0830 12/20  . CARDIOVERSION  05/27/2011   Procedure: CARDIOVERSION;  Surgeon:  Duke Salvia, MD;  Location: [facility] OR;  Service: Cardiovascular;  Laterality: N/A;  . CATARACT EXTRACTION Bilateral   . PERICARDIAL WINDOW  02/25/2012   Procedure: PERICARDIAL WINDOW;  Surgeon: Delight Ovens, MD;  Location: [facility] OR;  Service: Thoracic;  Laterality: N/A;  . POLYPECTOMY    . skin cancer removal    . TEE WITHOUT CARDIOVERSION  02/25/2012   Procedure: TRANSESOPHAGEAL ECHOCARDIOGRAM (TEE);  Surgeon: Delight Ovens, MD;  Location: [facility] OR;  Service: Thoracic;  Laterality: N/A;  . TOTAL HYSTERECTOMY AND BILATERAL SALPINGOOPHERECTOMY      Current Outpatient Medications  Medication Sig Dispense Refill  . acetaminophen (TYLENOL) 500 MG tablet Take 500 mg by mouth every 6 (six) hours as needed.    . Cholecalciferol (VITAMIN D3) 2000 units capsule Take 1 capsule (2,000 Units total) by mouth daily. 100 capsule 3  . denosumab (PROLIA) 60 MG/ML SOSY injection Inject 60 mg into the skin every 6 (six) months.    . diltiazem (CARDIZEM CD) 240 MG 24 hr capsule Take 1 capsule (240 mg total) by mouth daily. Must keep appt for further refills 90 capsule 0  . nebivolol (BYSTOLIC) 5 MG tablet Take 1 tablet (5 mg total) by mouth daily. 90 tablet 3  . ramipril (ALTACE) 10 MG capsule Take 1 capsule (10 mg total) by mouth daily. 90 capsule 3  . Rivaroxaban (XARELTO) 15  MG TABS tablet Take 1 tablet (15 mg total) by mouth daily with supper. 90 tablet 3  . tiZANidine (ZANAFLEX) 4 MG tablet Take 0.5-1 tablets (2-4 mg total) by mouth every 8 (eight) hours as needed for muscle spasms. 60 tablet 1  . traMADol (ULTRAM) 50 MG tablet Take 0.5-1 tablets (25-50 mg total) by mouth every 6 (six) hours as needed for severe pain. 20 tablet 0  . zolpidem (AMBIEN) 10 MG tablet TAKE 1 TABLET(10 MG) BY MOUTH AT BEDTIME AS NEEDED FOR SLEEP 30 tablet 3   No current facility-administered medications for this visit.    Allergies  Allergen Reactions  . Other Other (See Comments)    Novocaine.  REACTION:  Rapid heart rate    . Procaine Hcl     Rapid heart rate. The pt can tolerate Lidocaine OK.    Review of Systems negative except from HPI and PMH  Physical Exam BP 120/68   Pulse 74   Ht 5\' 4"  (1.626 m)   Wt 125 lb (56.7 kg)   SpO2 98%   BMI 21.46 kg/m  Well developed and nourished in no acute distress HENT normal Neck supple with JVP-fl     Carotids brisk and full without bruits Clear Irregularly irregular rate and rhythm with controlledventricular response, no murmurs or gallops Abd-soft with active BS without hepatomegaly No Clubbing cyanosis edema Skin-warm and dry A & Oriented  Grossly normal sensory and motor function  ECG >> atrial fibrillation at 74 intervals-/07/40 Assessment and  Plan  Atrial fibrillation with a Controlled ventricular response   Renal insufficiency   Hypertension    Blood pressure well controlled  Check anticoagulation labs today  No interval symptomatic atrial fibrillation  No bleeding.  Reviewed drug interactions with her supplements

## 2019-08-08 LAB — BASIC METABOLIC PANEL
BUN/Creatinine Ratio: 25 (ref 12–28)
BUN: 23 mg/dL (ref 8–27)
CO2: 23 mmol/L (ref 20–29)
Calcium: 8.9 mg/dL (ref 8.7–10.3)
Chloride: 105 mmol/L (ref 96–106)
Creatinine, Ser: 0.93 mg/dL (ref 0.57–1.00)
GFR calc Af Amer: 67 mL/min/{1.73_m2} (ref >59)
GFR calc non Af Amer: 58 mL/min/{1.73_m2} — ABNORMAL LOW (ref >59)
Glucose: 87 mg/dL (ref 65–99)
Potassium: 4.5 mmol/L (ref 3.5–5.2)
Sodium: 141 mmol/L (ref 134–144)

## 2019-08-08 LAB — CBC
Hematocrit: 40.6 % (ref 34.0–46.6)
Hemoglobin: 13.4 g/dL (ref 11.1–15.9)
MCH: 32.5 pg (ref 26.6–33.0)
MCHC: 33 g/dL (ref 31.5–35.7)
MCV: 99 fL — ABNORMAL HIGH (ref 79–97)
Platelets: 197 10*3/uL (ref 150–450)
RBC: 4.12 x10E6/uL (ref 3.77–5.28)
RDW: 11.8 % (ref 11.7–15.4)
WBC: 5.5 10*3/uL (ref 3.4–10.8)

## 2019-08-13 ENCOUNTER — Other Ambulatory Visit: Payer: Self-pay

## 2019-08-13 NOTE — Telephone Encounter (Signed)
New message  The patient has two request questions   1. Discuss red blood cells - recently blood work done at her Cardiologist   1.Medication Requested:traMADol (ULTRAM) 50 MG tablet   2. Pharmacy (Name, Street, City):[id number] - [place], [place] - [address] AT [facility] OF [address]  3. On Med List: Yes   4. Last Visit with PCP: 6.9.21   5. Next visit date with PCP: n/a   Agent: Please be advised that RX refills may take up to 3 business days. We ask that you follow-up with your pharmacy.

## 2019-08-13 NOTE — Telephone Encounter (Signed)
[place] Controlled Database Checked Last filled: 07/04/2019 (20) LOV w/you: 07/04/2019 Next appt w/you: none

## 2019-08-17 MED ORDER — TRAMADOL HCL 50 MG PO TABS: 25.0000 mg | ORAL_TABLET | Freq: Four times a day (QID) | ORAL | 1 refills | Status: DC | PRN

## 2019-08-17 NOTE — Telephone Encounter (Signed)
See med refill.

## 2019-08-20 ENCOUNTER — Other Ambulatory Visit: Payer: Self-pay | Admitting: Internal Medicine

## 2019-08-20 NOTE — Telephone Encounter (Signed)
[place] Controlled Database Checked Last filled: 07/23/2019 (30) LOV w/you: 07/04/2019 Next appt w/you: none

## 2019-08-29 DIAGNOSIS — D485 Neoplasm of uncertain behavior of skin: Secondary | ICD-10-CM | POA: Diagnosis not present

## 2019-08-29 DIAGNOSIS — D034 Melanoma in situ of scalp and neck: Secondary | ICD-10-CM | POA: Diagnosis not present

## 2019-08-29 DIAGNOSIS — Z85828 Personal history of other malignant neoplasm of skin: Secondary | ICD-10-CM | POA: Diagnosis not present

## 2019-09-06 DIAGNOSIS — L988 Other specified disorders of the skin and subcutaneous tissue: Secondary | ICD-10-CM | POA: Diagnosis not present

## 2019-09-06 DIAGNOSIS — D034 Melanoma in situ of scalp and neck: Secondary | ICD-10-CM | POA: Diagnosis not present

## 2019-09-06 DIAGNOSIS — Z85828 Personal history of other malignant neoplasm of skin: Secondary | ICD-10-CM | POA: Diagnosis not present

## 2019-09-21 ENCOUNTER — Telehealth: Payer: Self-pay | Admitting: Internal Medicine

## 2019-09-21 NOTE — Telephone Encounter (Signed)
    [employer] calling with clinical questions Requesting call back to [phone], option 5

## 2019-09-22 ENCOUNTER — Other Ambulatory Visit: Payer: Self-pay | Admitting: Internal Medicine

## 2019-09-24 NOTE — Telephone Encounter (Signed)
Xarelto 15mg  refill request received. Pt is 84 years old, weight-56.7kg, Crea-0.93 on 08/07/2019, last seen by Dr. 08/09/2019 on 08/07/2019, Diagnosis-Afib, CrCl-42.70ml/min; Dose is appropriate based on dosing criteria. Will send in refill to requested pharmacy.

## 2019-09-24 NOTE — Telephone Encounter (Signed)
Returned [employer] call that states Prolia was denied

## 2019-09-26 NOTE — Telephone Encounter (Signed)
I received a call from [facility] stating they received the PA form for the patient but a PA is not required. Called and discuss with patient. Appointment scheduled.

## 2019-09-26 NOTE — Telephone Encounter (Signed)
Patient is calling states she got a letter from [facility] saying they may or may not pay for her Prolia shot.  Patient would like some one to call her about this. She states she has been getting the shot.

## 2019-10-08 ENCOUNTER — Telehealth: Payer: Self-pay | Admitting: Internal Medicine

## 2019-10-08 NOTE — Telephone Encounter (Signed)
M  plz adavise  tramadol may be contributing She should prob see her PCP or one of the aApps  and then decide as to whether and why she needs a diuretic Thanks SK

## 2019-10-08 NOTE — Telephone Encounter (Signed)
Spoke with pt who complains of bilateral swelling of feet and ankles x 10-14 days.  Pt states she does not regularly take her B/P because it is well controlled.  Pt states she is well hydrated by drinking plenty of water and does monitor hersalt intake closely.  Pt reports she is taking her medications as prescribed.  She has had Tramadol added to her med list in the last 2 months for back pain.  Pt denies other symptoms such as chest pain or shortness of breath.  Pt advised will discuss with Dr Graciela Husbands for review and recommendations.  Pt verbalizes understanding and agrees with current plan

## 2019-10-08 NOTE — Telephone Encounter (Signed)
Pt c/o swelling: STAT is pt has developed SOB within 24 hours  1) How much weight have you gained and in what time span? Not sure   2) If swelling, where is the swelling located? Feet   3) Are you currently taking a fluid pill? No   4) Are you currently SOB? No   5) Do you have a log of your daily weights (if so, list)? No   6) Have you gained 3 pounds in a day or 5 pounds in a week? No   7) Have you traveled recently? No   Sacred is calling stating she has been having swelling in her feet for the past week and states she has never had this occur before besides when pregnant years ago. She states it hurts when she walks due to the puffiness. Please advise.

## 2019-10-09 NOTE — Telephone Encounter (Signed)
Attempted phone call to pt.  Left voicemail message to contact RN at [phone]. 

## 2019-10-09 NOTE — Telephone Encounter (Signed)
Spoke with pt and advised of Dr Odessa Fleming recommendation that Tramadol could be contributing to swelling and pt should f/u with her PCP or [facility] APP.  Pt would like to see APP.  Appt scheduled with Francis Dowse, PA-C 10/19/2019 at 1145am.  Pt verbalizes understanding and thanked Charity fundraiser for call.

## 2019-10-18 ENCOUNTER — Ambulatory Visit (INDEPENDENT_AMBULATORY_CARE_PROVIDER_SITE_OTHER): Payer: Medicare Other

## 2019-10-18 ENCOUNTER — Other Ambulatory Visit: Payer: Self-pay

## 2019-10-18 DIAGNOSIS — M810 Age-related osteoporosis without current pathological fracture: Secondary | ICD-10-CM

## 2019-10-18 MED ORDER — DENOSUMAB 60 MG/ML ~~LOC~~ SOSY
60.0000 mg | PREFILLED_SYRINGE | Freq: Once | SUBCUTANEOUS | Status: AC
  Administered 2019-10-18: 60 mg via SUBCUTANEOUS

## 2019-10-18 NOTE — Progress Notes (Addendum)
Prolia injection given to pt w/o complication.  Medical screening examination/treatment/procedure(s) were performed by non-physician practitioner and as supervising physician I was immediately available for consultation/collaboration.  I agree with above. Jacinta Shoe, MD

## 2019-10-19 ENCOUNTER — Ambulatory Visit: Payer: Medicare Other | Admitting: Physician Assistant

## 2019-10-29 ENCOUNTER — Telehealth: Payer: Self-pay | Admitting: Internal Medicine

## 2019-10-29 NOTE — Telephone Encounter (Signed)
Patient called stating she received a letter from her insurance stating that as of 10/25/19 they are no longer going to pay for the medication nebivolol (BYSTOLIC) 5 MG tablet they will only pay for the generic.  In order for insurance to cover name brand she will need an approval for it.

## 2019-10-29 NOTE — Telephone Encounter (Signed)
I will send to both our prior auth nurse as well as the primary card nurse for Dr. Graciela Husbands who prescribed the medication.

## 2019-10-30 NOTE — Telephone Encounter (Signed)
**Note De-Identified Selena Taylor Obfuscation** I started a Nebivolol PA through covermymeds: Key: [id number]

## 2019-11-05 NOTE — Telephone Encounter (Signed)
**Note De-Identified Avah Bashor Obfuscation** Letter received from [facility] stating that a PA is no longer required for the pts Nebivolol as it was added to her plans covered medication list as of 10/25/2019. I have notified [facility] of this update in the pts plan.

## 2019-11-16 ENCOUNTER — Other Ambulatory Visit: Payer: Self-pay

## 2019-11-16 ENCOUNTER — Ambulatory Visit (INDEPENDENT_AMBULATORY_CARE_PROVIDER_SITE_OTHER): Payer: Medicare Other | Admitting: *Deleted

## 2019-11-16 DIAGNOSIS — Z23 Encounter for immunization: Secondary | ICD-10-CM

## 2019-11-27 ENCOUNTER — Telehealth: Payer: Self-pay

## 2019-11-27 NOTE — Telephone Encounter (Signed)
**Note De-Identified Selena Taylor Obfuscation** I started a Nebivolol/Bystolic PA through covermymeds: Key: [id number]

## 2019-11-29 DIAGNOSIS — Z85828 Personal history of other malignant neoplasm of skin: Secondary | ICD-10-CM | POA: Diagnosis not present

## 2019-11-29 DIAGNOSIS — L821 Other seborrheic keratosis: Secondary | ICD-10-CM | POA: Diagnosis not present

## 2019-11-29 DIAGNOSIS — Z85820 Personal history of malignant melanoma of skin: Secondary | ICD-10-CM | POA: Diagnosis not present

## 2019-11-29 DIAGNOSIS — L570 Actinic keratosis: Secondary | ICD-10-CM | POA: Diagnosis not present

## 2019-12-05 NOTE — Telephone Encounter (Signed)
**Note De-Identified Selena Taylor Obfuscation** Following message received from covermymeds[employer]ILIA PIPPENGER Key: [id number] Outcome: Approved on October 5 Effective from 10/30/2019 through 10/29/2020. Drug: Nebivolol HCl 5MG  tablets Form: [employer] [place] [employer] Part D General Authorization Form   See Phone note from 10/04/2021concerning this as well: November 05, 2019 Me  12:55 PM Note Letter received from [facility] stating that a PA is no longer required for the pts Nebivolol as it was added to her plans covered medication list as of 10/25/2019. I have notified [facility] of this update in the pts plan.

## 2019-12-16 ENCOUNTER — Other Ambulatory Visit: Payer: Self-pay | Admitting: Internal Medicine

## 2020-01-29 ENCOUNTER — Telehealth: Payer: Self-pay | Admitting: Internal Medicine

## 2020-01-29 MED ORDER — RIVAROXABAN 15 MG PO TABS: ORAL_TABLET | ORAL | 1 refills | Status: DC

## 2020-01-29 NOTE — Telephone Encounter (Signed)
Hey Lynn, LPN, can you please advise on this matter? Thanks  ?

## 2020-01-29 NOTE — Telephone Encounter (Signed)
Prescription refill request for Xarelto received.  Indication:afib Last office visit:08/07/19 Weight:56.7kg Age:85 Scr:0.93 CrCl:42 ml/min

## 2020-01-29 NOTE — Telephone Encounter (Signed)
  Pt c/o medication issue: 1. Name of Medication:   nebivolol (BYSTOLIC) 5 MG tablet   2. How are you currently taking this medication (dosage and times per day)? As directed  3. Are you having a reaction (difficulty breathing--STAT)?  NA  4. What is your medication issue?   Patient states she was told by [facility] that she needs Dr Graciela Husbands to take her out of Tier 1 and put her in Tier 2 so that she won't have to pay as much for her medications since Francine Graven is her new insurance.

## 2020-01-29 NOTE — Telephone Encounter (Addendum)
**Note De-Identified Selena Taylor Obfuscation** The pt states that she just purchased a 30 day supply of Bystolic and that it cost her $26 and is requesting that we do a tier exception in hopes that the cost can be lowered going forward. She is aware that I am going to attempt a tier exception and she provided mr with her new ins info as follows: ID [id number] BIN: [id number] PCN: [id number] GRP: [id number]

## 2020-01-29 NOTE — Telephone Encounter (Signed)
 *  STAT* If patient is at the pharmacy, call can be transferred to refill team.   1. Which medications need to be refilled? (please list name of each medication and dose if known)   XARELTO 15 MG TABS tablet  2. Which pharmacy/location (including street and city if local pharmacy) is medication to be sent to?  [id number] - [place], [place] - [address] AT [facility] OF [address]  3. Do they need a 30 day or 90 day supply? 90 day

## 2020-01-30 NOTE — Telephone Encounter (Addendum)
**Note De-Identified Patirica Longshore Obfuscation** I started a Nebivolol Tier Ex through covermymeds. Key: Selena Taylor

## 2020-01-31 ENCOUNTER — Telehealth: Payer: Self-pay | Admitting: Internal Medicine

## 2020-01-31 MED ORDER — NEBIVOLOL HCL 5 MG PO TABS: 5.0000 mg | ORAL_TABLET | Freq: Every day | ORAL | 12 refills | Status: DC

## 2020-01-31 NOTE — Telephone Encounter (Signed)
**Note De-Identified Orian Amberg Obfuscation** The pt states that she swithed to [facility] ins from [facility] this year because she was advised that her meds would be less expensive but is now finding that they are not and are more expensive. She states that she is contacting [employer] today to to discuss and if this is how much her medications are going to cost all year she is switching back to [place].  She is requesting Bystolic and Xarelto samples as she is almost out of both and is unsure of what to do for now. I advised her that we will leave her a bottle/box of samples of each and that going forward she will need to let us know if she wants to switch to a less expensive/generic beta blocker. She does not want to switch to Warfarin and will attempt to cont at current cost for Xarelto.

## 2020-01-31 NOTE — Telephone Encounter (Signed)
New Message:     Pt wants to know if she can continue to take the Bystolic and not the generic. If so, she will need the Bystolic called in.

## 2020-01-31 NOTE — Telephone Encounter (Signed)
Hey Lynn, LPN, can you please advise on this matter? Thanks  ?

## 2020-01-31 NOTE — Telephone Encounter (Signed)
Patient calling the office for samples of medication:   1.  What medication and dosage are you requesting samples for?  nebivolol (BYSTOLIC) 5 MG tablet    2.  Are you currently out of this medication? Only has 1 tablet left

## 2020-01-31 NOTE — Telephone Encounter (Signed)
**Note De-Identified Pamelia Botto Obfuscation** Letter received from [facility] stating that they denied the pts Bystolic tier exception. Reason: Quintella Reichert is a medication that is already at the lowest tier possible for a name brand medication per Medicare part D rules.  The pt is aware and is calling [employer] to discuss.Marland Kitchen

## 2020-01-31 NOTE — Telephone Encounter (Signed)
Pt calling to discuss taking name brand bystolic vs the generic. She did not qualify for the tier exception and her medication is around $95/mo. She has not tried the generic version as of yet. She is not happy with [employer] and would like to switch back to traditional medicare.   I advised her Bystolic is now available as a generic and the formula/active ingredient should be very similar to the name brand. She requested a refill of the generic Bystolic. I advised her to try the generic and see how she feels. If she is not feeling as well on the generic, perhaps Dr. Graciela Husbands could try her on a new medication or again request a tier exception.   She has verbalized understanding and agreed with this plan.

## 2020-01-31 NOTE — Telephone Encounter (Signed)
Your welcome, thank you again.

## 2020-02-15 ENCOUNTER — Other Ambulatory Visit: Payer: Self-pay | Admitting: Internal Medicine

## 2020-02-21 DIAGNOSIS — Z85828 Personal history of other malignant neoplasm of skin: Secondary | ICD-10-CM | POA: Diagnosis not present

## 2020-02-21 DIAGNOSIS — L570 Actinic keratosis: Secondary | ICD-10-CM | POA: Diagnosis not present

## 2020-02-21 DIAGNOSIS — L0889 Other specified local infections of the skin and subcutaneous tissue: Secondary | ICD-10-CM | POA: Diagnosis not present

## 2020-02-21 DIAGNOSIS — L245 Irritant contact dermatitis due to other chemical products: Secondary | ICD-10-CM | POA: Diagnosis not present

## 2020-02-28 DIAGNOSIS — Z85828 Personal history of other malignant neoplasm of skin: Secondary | ICD-10-CM | POA: Diagnosis not present

## 2020-02-28 DIAGNOSIS — L245 Irritant contact dermatitis due to other chemical products: Secondary | ICD-10-CM | POA: Diagnosis not present

## 2020-03-13 DIAGNOSIS — Z85828 Personal history of other malignant neoplasm of skin: Secondary | ICD-10-CM | POA: Diagnosis not present

## 2020-03-13 DIAGNOSIS — L245 Irritant contact dermatitis due to other chemical products: Secondary | ICD-10-CM | POA: Diagnosis not present

## 2020-03-13 DIAGNOSIS — L821 Other seborrheic keratosis: Secondary | ICD-10-CM | POA: Diagnosis not present

## 2020-03-19 ENCOUNTER — Telehealth: Payer: Self-pay | Admitting: Internal Medicine

## 2020-03-19 MED ORDER — ZOLPIDEM TARTRATE 10 MG PO TABS: ORAL_TABLET | ORAL | 2 refills | Status: DC

## 2020-03-19 NOTE — Telephone Encounter (Signed)
Notified pt w/MD response.../lmb 

## 2020-03-19 NOTE — Telephone Encounter (Signed)
zolpidem (AMBIEN) 10 MG tablet Patient is wondering if she can get this for 60 days instead of 30

## 2020-03-19 NOTE — Telephone Encounter (Signed)
Okay.  Office visit every 3 months.  Thanks 

## 2020-03-19 NOTE — Telephone Encounter (Signed)
Patient thinks its time for her prolia shot so she was calling to see if we could get that insurance auth and get her an appointment

## 2020-03-31 ENCOUNTER — Telehealth: Payer: Self-pay | Admitting: Internal Medicine

## 2020-03-31 NOTE — Telephone Encounter (Signed)
Spoke with pt complaining of increased LEE x 2 months.  Denies current CP or SOB.  Pt does not weigh daily and reports she is taking medications as prescribed.  Pt drinks 1-2 glasses of water daily other wise she drinks mostly decaf tea.  Pt states she probably has been taking in more Na+ lately through her diet.  Pt reports edema does not resolve at night, she is mostly up on her feet all day but does try to elevate in the evening.   Pt advised to monitor Na+ intake closely, increase water intake, elevate feet and legs as much as possible and try compression stockings to aid in fluid retention. Will also discuss with Dr Graciela Husbands for further recommendation.  Pt verbalizes understanding and agrees with current plan.

## 2020-03-31 NOTE — Telephone Encounter (Signed)
Pt c/o swelling: STAT is pt has developed SOB within 24 hours  1) How much weight have you gained and in what time span? No weight gain   2) If swelling, where is the swelling located? Legs and feet   3) Are you currently taking a fluid pill? No   4) Are you currently SOB? No   5) Do you have a log of your daily weights (if so, list)? No log available  6) Have you gained 3 pounds in a day or 5 pounds in a week? No   7) Have you traveled recently? No

## 2020-04-04 ENCOUNTER — Telehealth: Payer: Self-pay | Admitting: Cardiology

## 2020-04-04 NOTE — Telephone Encounter (Signed)
    Received OP page regarding patients elevated BP and increased LE edema. She has a hx of AF and is maintained on Diltiazem and Bystolic. She states that she has noticed that her ankles have been more swollen over the last several months and that her BPs were elevated more than normal the last two days. Highest BP noted to be 173/114 with a HR at 108bpm. She had not yet taken her Bystolic or Diltiazem. I have instructed her to take both of these and if her HO and BP remain SBP>150-160 and HR >100, then she can take an additional Bystolic. She has not been seen by Dr. Graciela Husbands or APP since 07/2019. I have instructed her to call the office and schedule and appointment as there os little we can do over the phone. The patient understands and agrees. She is stable and not in distress. I do not think she needs to come to the ED. She denies chest pain or SOB.   Georgie Chard NP-C [facility] Pager: [phone]

## 2020-04-07 ENCOUNTER — Telehealth: Payer: Self-pay | Admitting: Medical

## 2020-04-07 NOTE — Telephone Encounter (Signed)
Spoke with pt re increased BP and edema.  Pt advised of need to schedule appt per [facility] .  Appointment scheduled with Otilio Saber, PA-C for Friday 04/11/2020.  Reiterated instruction per Georgie Chard for elevated BP. If HR and BP remain SBP>150-160 and HR >100, then she can take an additional Bystolic.  Reviewed ED precautions with pt.  Pt verbalizes understanding and thanked Charity fundraiser for the call.

## 2020-04-07 NOTE — Telephone Encounter (Signed)
Spoke with pt re increased BP and edema.  Pt advised of need to schedule appt per Jill McDaniel,NP .  Appointment scheduled with Andy Tillery, PA-C for Friday 04/11/2020.  Reiterated instruction per Jill McDaniel for elevated BP. If HR and BP remain SBP>150-160 and HR >100, then she can take an additional Bystolic.  Reviewed ED precautions with pt.  Pt verbalizes understanding and thanked RN for the call. 

## 2020-04-07 NOTE — Telephone Encounter (Signed)
   Patient called the after hours line to report BP elevated to 164/95 and HR 99. Appears this has been concerning to her over the past several days. She had no complaints of chest pain, SOB, dizziness, lightheadedness, or syncope. She had not taken her bystolic tonight. Instructed her to take 5mg  now and if BP/HR remain elevated after 1 hour, she could take an additional 5mg  bystolic this evening. Advised to limit salt intake. She is scheduled to see , PA-C 04/11/20 and will bring her BP log to that visit. She was appreciative of the call.   Otilio Saber, PA-C 04/07/20; 7:40 PM

## 2020-04-07 NOTE — Telephone Encounter (Signed)
Pt called in and stated that she did take 2 bystolic and 2 Dilitazem and her bp was good this morning 118/74 with hr of 79.  She would like to know going forward how she needs to take her meds or does Dr Graciela Husbands need to see her sooner ?     Best number [phone]

## 2020-04-10 NOTE — Progress Notes (Unsigned)
PCP:  Tresa Garter, MD Primary Cardiologist: Sherryl Manges, MD Electrophysiologist: Sherryl Manges, MD   Selena Taylor is a 85 y.o. female seen today for Sherryl Manges, MD for routine electrophysiology followup.  Since last being seen in our clinic the patient reports doing regular follow up. She reports doing overall well. She has had labile blood pressures, ranging from 110-160s systolic at home. She took an extra bystolic which seemed to help. She has had mild peripheral edema. she denies chest pain, palpitations, dyspnea, PND, orthopnea, nausea, vomiting, dizziness, syncope, weight gain, or early satiety.  Past Medical History:  Diagnosis Date  . Anxiety   . CHF (congestive heart failure) (HCC)   . Chronic anticoagulation    on coumadin  . Endometriosis   . HTN (hypertension)   . Osteopenia   . PAF (paroxysmal atrial fibrillation) (HCC)    has been on Flecainide in the past. Stopped 02/10/11; Remains on coumadin anticoagulation; intol to Rythmol and failed DCCV; noted to be in AFlutter 5/13;  Echo 01/2011:  EF 55-60%, mild MR, mild LAE.  Myoview 6/12:  No ischemia, EF 74%  . Pericardial effusion   . Pericardial effusion 02/23/2012  . Shortness of breath   . TIA (transient ischemic attack)    Past Surgical History:  Procedure Laterality Date  . APPENDECTOMY    . CARDIOVERSION  01/14/2011   Procedure: CARDIOVERSION;  Surgeon: Duke Salvia, MD;  Location[facility] OR;  Service: Cardiovascular;  Laterality: N/A;  To be completed in Neuro OR 33 time slot 0830 12/20  . CARDIOVERSION  05/27/2011   Procedure: CARDIOVERSION;  Surgeon: Duke Salvia, MD;  Location: [facility] OR;  Service: Cardiovascular;  Laterality: N/A;  . CATARACT EXTRACTION Bilateral   . PERICARDIAL WINDOW  02/25/2012   Procedure: PERICARDIAL WINDOW;  Surgeon: Delight Ovens, MD;  Location: [facility] OR;  Service: Thoracic;  Laterality: N/A;  . POLYPECTOMY    . skin cancer removal    . TEE WITHOUT CARDIOVERSION  02/25/2012    Procedure: TRANSESOPHAGEAL ECHOCARDIOGRAM (TEE);  Surgeon: Delight Ovens, MD;  Location: [facility] OR;  Service: Thoracic;  Laterality: N/A;  . TOTAL HYSTERECTOMY AND BILATERAL SALPINGOOPHERECTOMY      Current Outpatient Medications  Medication Sig Dispense Refill  . Cholecalciferol (VITAMIN D3) 2000 units capsule Take 1 capsule (2,000 Units total) by mouth daily. 100 capsule 3  . denosumab (PROLIA) 60 MG/ML SOSY injection Inject 60 mg into the skin every 6 (six) months.    . diltiazem (CARDIZEM CD) 240 MG 24 hr capsule Take 1 capsule (240 mg total) by mouth daily. 30 capsule 12  . nebivolol (BYSTOLIC) 5 MG tablet Take 1 tablet (5 mg total) by mouth daily. 30 tablet 12  . ramipril (ALTACE) 10 MG capsule Take 1 capsule (10 mg total) by mouth daily. 30 capsule 12  . Rivaroxaban (XARELTO) 15 MG TABS tablet TAKE 1 TABLET(15 MG) BY MOUTH DAILY WITH SUPPER 90 tablet 1  . tiZANidine (ZANAFLEX) 4 MG tablet Take 0.5-1 tablets (2-4 mg total) by mouth every 8 (eight) hours as needed for muscle spasms. 60 tablet 1  . traMADol (ULTRAM) 50 MG tablet TAKE 1/2 TO 1 TABLET(25 TO 50 MG) BY MOUTH EVERY 6 HOURS AS NEEDED FOR SEVERE PAIN 100 tablet 3  . zolpidem (AMBIEN) 10 MG tablet TAKE 1 TABLET(10 MG) BY MOUTH AT BEDTIME AS NEEDED FOR SLEEP 60 tablet 2   No current facility-administered medications for this visit.    Allergies  Allergen  Reactions  . Other Other (See Comments)    Novocaine.  REACTION:  Rapid heart rate  . Procaine Hcl     Rapid heart rate. The pt can tolerate Lidocaine OK.    Social History   Socioeconomic History  . Marital status: Widowed    Spouse name: Not on file  . Number of children: 1  . Years of education: Not on file  . Highest education level: Not on file  Occupational History  . Occupation: retired  Tobacco Use  . Smoking status: Never Smoker  . Smokeless tobacco: Never Used  Vaping Use  . Vaping Use: Never used  Substance and Sexual Activity  . Alcohol use: Yes     Alcohol/week: 14.0 standard drinks    Types: 14 Glasses of wine per week    Comment: 1-2 glasses of wine nightly  . Drug use: No  . Sexual activity: Not Currently  Other Topics Concern  . Not on file  Social History Narrative   HSG; Became a stewardness..   Married - 1959.Marland Kitchen   1 son - '65; 1 daughter '60; 2 grandchildren..   Occupation: Retired..   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   End of life Care: no DNR, DNI, no futile or heroic measures.            Social Determinants of Health   Financial Resource Strain: Not on file  Food Insecurity: Not on file  Transportation Needs: Not on file  Physical Activity: Not on file  Stress: Not on file  Social Connections: Not on file  Intimate Partner Violence: Not on file     Review of Systems: All other systems reviewed and are otherwise negative except as noted above.  Physical Exam: Vitals:   04/11/20 0848  BP: 120/84  Pulse: 81  SpO2: 97%  Weight: 124 lb 9.6 oz (56.5 kg)  Height: 5\' 2"  (1.575 m)    GEN- The patient is well appearing, alert and oriented x 3 today.   HEENT: normocephalic, atraumatic; sclera clear, conjunctiva pink; hearing intact; oropharynx clear; neck supple, no JVP Lymph- no cervical lymphadenopathy Lungs- Clear to ausculation bilaterally, normal work of breathing.  No wheezes, rales, rhonchi Heart- Regular rate and rhythm, no murmurs, rubs or gallops, PMI not laterally displaced GI- soft, non-tender, non-distended, bowel sounds present, no hepatosplenomegaly Extremities- no clubbing or cyanosis. Trace to 1+ ankle edema; DP/PT/radial pulses 2+ bilaterally MS- no significant deformity or atrophy Skin- warm and dry, no rash or lesion Psych- euthymic mood, full affect Neuro- strength and sensation are intact  EKG is ordered. Personal review of EKG from today shows AF with controlled rates at 81 bpm and PVCs  Additional studies reviewed include: Previous EP notes, recent phone notes.   Assessment  and Plan:  1. Atrial fibrillation with a controlled ventricular response Continue Xarelto for CHA2DS2VASC of at least 6. Will double check Cr today for correct dosing    2. Renal insufficieny BMET today and 10 days  3. HTN Continue ramipril and bystolic.  Bystolic is close to $90. Discussed GoodRx. She has failed metoprolol due to fatigue and propanolol due to dizziness.  Add spiro 25 mg daily. BMET today and 10 days.   4. Peripheral edema ? Chronic diastolic CHF Likely with chronic HTN and AF Adding low dose spiro as above for better BP control and with data in both systolic and diastolic HF patients.  Follow Cr and K closely. Update Echo  , PA-C  04/11/20 9:00 AM

## 2020-04-11 ENCOUNTER — Encounter: Payer: Self-pay | Admitting: Student

## 2020-04-11 ENCOUNTER — Ambulatory Visit: Payer: Medicare Other | Admitting: Student

## 2020-04-11 ENCOUNTER — Other Ambulatory Visit: Payer: Self-pay

## 2020-04-11 VITALS — BP 120/84 | HR 81 | Ht 62.0 in | Wt 124.6 lb

## 2020-04-11 DIAGNOSIS — I480 Paroxysmal atrial fibrillation: Secondary | ICD-10-CM

## 2020-04-11 DIAGNOSIS — N289 Disorder of kidney and ureter, unspecified: Secondary | ICD-10-CM | POA: Diagnosis not present

## 2020-04-11 DIAGNOSIS — I4891 Unspecified atrial fibrillation: Secondary | ICD-10-CM | POA: Diagnosis not present

## 2020-04-11 DIAGNOSIS — Z79899 Other long term (current) drug therapy: Secondary | ICD-10-CM

## 2020-04-11 LAB — BASIC METABOLIC PANEL
BUN/Creatinine Ratio: 19 (ref 12–28)
BUN: 22 mg/dL (ref 8–27)
CO2: 23 mmol/L (ref 20–29)
Calcium: 9.5 mg/dL (ref 8.7–10.3)
Chloride: 102 mmol/L (ref 96–106)
Creatinine, Ser: 1.13 mg/dL — ABNORMAL HIGH (ref 0.57–1.00)
Glucose: 91 mg/dL (ref 65–99)
Potassium: 4.3 mmol/L (ref 3.5–5.2)
Sodium: 140 mmol/L (ref 134–144)
eGFR: 49 mL/min/{1.73_m2} — ABNORMAL LOW (ref >59)

## 2020-04-11 MED ORDER — NEBIVOLOL HCL 5 MG PO TABS: 5.0000 mg | ORAL_TABLET | Freq: Every day | ORAL | 3 refills | Status: DC

## 2020-04-11 MED ORDER — SPIRONOLACTONE 25 MG PO TABS: 25.0000 mg | ORAL_TABLET | Freq: Every day | ORAL | 3 refills | Status: DC

## 2020-04-11 NOTE — Patient Instructions (Addendum)
Medication Instructions:  Your physician has recommended you make the following change in your medication:   START: Spironolactone 25mg  daily  *If you need a refill on your cardiac medications before your next appointment, please call your pharmacy*   Lab Work: BMET Today; BMET on 04/21/2020 If you have labs (blood work) drawn today and your tests are completely normal, you will receive your results only by: 04/23/2020 MyChart Message (if you have MyChart) OR . A paper copy in the mail If you have any lab test that is abnormal or we need to change your treatment, we will call you to review the results.   Testing/Procedures: Your physician has requested that you have an echocardiogram. Echocardiography is a painless test that uses sound waves to create images of your heart. It provides your doctor with information about the size and shape of your heart and how well your heart's chambers and valves are working. This procedure takes approximately one hour. There are no restrictions for this procedure.  Follow-Up: At [facility], you and your health needs are our priority.  As part of our continuing mission to provide you with exceptional heart care, we have created designated Provider Care Teams.  These Care Teams include your primary Cardiologist (physician) and Advanced Practice Providers (APPs -  Physician Assistants and Nurse Practitioners) who all work together to provide you with the care you need, when you need it.   Your next appointment:   3 month(s)  The format for your next appointment:   In Person  Provider:   [facility], MD

## 2020-04-16 ENCOUNTER — Telehealth: Payer: Self-pay | Admitting: Internal Medicine

## 2020-04-16 NOTE — Telephone Encounter (Signed)
Called pt and explained the reason why we need to check her labs again. She was appreciative for the call.

## 2020-04-16 NOTE — Telephone Encounter (Signed)
Patient received a reminder call for a BMET scheduled for 04/21/20. She states she has not been made aware of this appointment and she would like to ensure that this was scheduled intentionally. She also had a BMET during 04/11/20 appointment, so she is unsure why she would need additional lab work. Looks like it was scheduled on the same day as her 04/11/20 appointment. Is this appointment necessary?  Please advise.

## 2020-04-21 ENCOUNTER — Other Ambulatory Visit: Payer: Self-pay

## 2020-04-21 ENCOUNTER — Other Ambulatory Visit: Payer: Medicare Other | Admitting: *Deleted

## 2020-04-21 DIAGNOSIS — I4891 Unspecified atrial fibrillation: Secondary | ICD-10-CM

## 2020-04-21 LAB — BASIC METABOLIC PANEL
BUN/Creatinine Ratio: 28 (ref 12–28)
BUN: 32 mg/dL — ABNORMAL HIGH (ref 8–27)
CO2: 22 mmol/L (ref 20–29)
Calcium: 9.7 mg/dL (ref 8.7–10.3)
Chloride: 101 mmol/L (ref 96–106)
Creatinine, Ser: 1.16 mg/dL — ABNORMAL HIGH (ref 0.57–1.00)
Glucose: 83 mg/dL (ref 65–99)
Potassium: 4.7 mmol/L (ref 3.5–5.2)
Sodium: 140 mmol/L (ref 134–144)
eGFR: 47 mL/min/{1.73_m2} — ABNORMAL LOW (ref >59)

## 2020-04-23 ENCOUNTER — Telehealth: Payer: Self-pay | Admitting: Student

## 2020-04-23 ENCOUNTER — Other Ambulatory Visit: Payer: Self-pay

## 2020-04-23 MED ORDER — FUROSEMIDE 20 MG PO TABS: 10.0000 mg | ORAL_TABLET | ORAL | 3 refills | Status: DC | PRN

## 2020-04-23 NOTE — Telephone Encounter (Signed)
Would stop spiro.   Please give her a prescription for lasix 10 mg daily to use AS NEEDED for peripheral edema, especially if associated with 3 lb weight gain overnight, or 5 lbs within one week.   Thank you! Casimiro Needle [address] [place], [place]  04/23/2020 12:39 PM

## 2020-04-23 NOTE — Telephone Encounter (Signed)
I called pt and discussed this medication change with her. Rx for Lasix was sent in to her pharmacy.

## 2020-04-23 NOTE — Telephone Encounter (Signed)
Pt c/o medication issue:  1. Name of Medication: spironolactone (ALDACTONE) 25 MG tablet  2. How are you currently taking this medication (dosage and times per day)? Did not take yesterday, but did take 1 tablet today   3. Are you having a reaction (difficulty breathing--STAT)? Yes   4. What is your medication issue? Barbaraann is calling stating this medication is making her dizzy, brings her mood down, and causes her face to become red and flushed. She reports this has been occurring since she took the first tablet. She states she kept taking it hoping her body would adjust, but it has not. Please advise.

## 2020-05-01 NOTE — Telephone Encounter (Signed)
Scheduled patient 5.5.22 for her Prolia injection  Copay due at the time of the visit: $275

## 2020-05-02 DIAGNOSIS — Z85820 Personal history of malignant melanoma of skin: Secondary | ICD-10-CM | POA: Diagnosis not present

## 2020-05-02 DIAGNOSIS — L570 Actinic keratosis: Secondary | ICD-10-CM | POA: Diagnosis not present

## 2020-05-02 DIAGNOSIS — Z85828 Personal history of other malignant neoplasm of skin: Secondary | ICD-10-CM | POA: Diagnosis not present

## 2020-05-02 DIAGNOSIS — L718 Other rosacea: Secondary | ICD-10-CM | POA: Diagnosis not present

## 2020-05-08 ENCOUNTER — Other Ambulatory Visit (HOSPITAL_COMMUNITY): Payer: Medicare Other

## 2020-05-09 ENCOUNTER — Ambulatory Visit (HOSPITAL_COMMUNITY): Payer: Medicare Other | Attending: Cardiology

## 2020-05-09 ENCOUNTER — Other Ambulatory Visit: Payer: Self-pay

## 2020-05-09 DIAGNOSIS — I4891 Unspecified atrial fibrillation: Secondary | ICD-10-CM | POA: Diagnosis not present

## 2020-05-09 LAB — ECHOCARDIOGRAM COMPLETE: S' Lateral: 2.8 cm

## 2020-05-12 ENCOUNTER — Telehealth: Payer: Self-pay | Admitting: Internal Medicine

## 2020-05-12 NOTE — Telephone Encounter (Signed)
Patient returning a call to discuss echo results  

## 2020-05-12 NOTE — Telephone Encounter (Signed)
Returned call to pt and she has been made aware of her echocardiogram results.  The patient has been notified of the result and verbalized understanding.  All questions (if any) were answered. Elliot Cousin, RMA 05/12/2020 1:13 PM

## 2020-05-22 DIAGNOSIS — Z85828 Personal history of other malignant neoplasm of skin: Secondary | ICD-10-CM | POA: Diagnosis not present

## 2020-05-22 DIAGNOSIS — L239 Allergic contact dermatitis, unspecified cause: Secondary | ICD-10-CM | POA: Diagnosis not present

## 2020-05-29 ENCOUNTER — Ambulatory Visit (INDEPENDENT_AMBULATORY_CARE_PROVIDER_SITE_OTHER): Payer: Medicare Other

## 2020-05-29 ENCOUNTER — Other Ambulatory Visit: Payer: Self-pay

## 2020-05-29 DIAGNOSIS — M810 Age-related osteoporosis without current pathological fracture: Secondary | ICD-10-CM

## 2020-05-29 MED ORDER — DENOSUMAB 60 MG/ML ~~LOC~~ SOSY
60.0000 mg | PREFILLED_SYRINGE | Freq: Once | SUBCUTANEOUS | Status: AC
  Administered 2020-05-29: 60 mg via SUBCUTANEOUS

## 2020-05-29 NOTE — Progress Notes (Addendum)
Pt hereProlia injection per Dr.Plotnikov  Prolia given right arm sub q, and pt tolerated injection.   Medical screening examination/treatment/procedure(s) were performed by non-physician practitioner and as supervising physician I was immediately available for consultation/collaboration.  I agree with above. Jacinta Shoe, MD

## 2020-06-24 DIAGNOSIS — Z85828 Personal history of other malignant neoplasm of skin: Secondary | ICD-10-CM | POA: Diagnosis not present

## 2020-06-24 DIAGNOSIS — L821 Other seborrheic keratosis: Secondary | ICD-10-CM | POA: Diagnosis not present

## 2020-06-24 DIAGNOSIS — D692 Other nonthrombocytopenic purpura: Secondary | ICD-10-CM | POA: Diagnosis not present

## 2020-08-04 DIAGNOSIS — L239 Allergic contact dermatitis, unspecified cause: Secondary | ICD-10-CM | POA: Diagnosis not present

## 2020-08-06 ENCOUNTER — Other Ambulatory Visit: Payer: Self-pay | Admitting: Internal Medicine

## 2020-08-13 ENCOUNTER — Other Ambulatory Visit: Payer: Self-pay | Admitting: Internal Medicine

## 2020-08-13 ENCOUNTER — Ambulatory Visit: Payer: Medicare Other | Admitting: Internal Medicine

## 2020-08-13 ENCOUNTER — Other Ambulatory Visit: Payer: Self-pay

## 2020-08-13 VITALS — BP 102/60 | HR 58 | Ht 62.0 in | Wt 125.6 lb

## 2020-08-13 DIAGNOSIS — G459 Transient cerebral ischemic attack, unspecified: Secondary | ICD-10-CM

## 2020-08-13 DIAGNOSIS — Z79899 Other long term (current) drug therapy: Secondary | ICD-10-CM

## 2020-08-13 DIAGNOSIS — I480 Paroxysmal atrial fibrillation: Secondary | ICD-10-CM

## 2020-08-13 MED ORDER — NEBIVOLOL HCL 5 MG PO TABS: 2.5000 mg | ORAL_TABLET | Freq: Every day | ORAL | 3 refills | Status: DC

## 2020-08-13 MED ORDER — FUROSEMIDE 40 MG PO TABS: 40.0000 mg | ORAL_TABLET | ORAL | 0 refills | Status: DC | PRN

## 2020-08-13 NOTE — Progress Notes (Signed)
Patient ID: Selena Taylor, female   DOB: Jul 25, 1935, 85 y.o.   MRN: [id number]       Patient Care Team: Tresa Garter, MD as PCP - General (Internal Medicine) Duke Salvia, MD as PCP - Cardiology (Cardiology) Duke Salvia, MD as PCP - Electrophysiology (Cardiology) Duke Salvia, MD as Consulting Physician (Cardiology) Venancio Poisson, MD as Consulting Physician (Dermatology)   HPI  Selena Taylor is a 85 y.o. female Seen in followup for atrial fibrillation and pericarditis. Because of persistent symptoms she had undergone a pericardial window.  She takes Rivaroxaban    Her husband died 2014/05/25. [place]  [place] to move out in May  On Anticoagulation;   no  bleeding  Today, the patient denies chest pain, nocturnal dyspnea, orthopnea.  There have been no palpitations, lightheadedness or syncope.  Complains of some dyspnea on exertion which is worse than it was 6 months ago.  Also has some edema.  Was subjected to a trial of diuretics at 10 mg furosemide, no diuresis.   Date Cr K Hgb  6/17 0.89 4.6 13.9  5/18 1.02 4.6 13.8   7/20  1.03 4.9   7/21 0.93 4.5 13.4  3/22 1.1 4.7     DATE TEST EF%   1/13 Echo    1/14 Echo    7/15 Echo    4/22 Echo 55-60%     Past Medical History:  Diagnosis Date   Anxiety    CHF (congestive heart failure) (HCC)    Chronic anticoagulation    on coumadin   Endometriosis    HTN (hypertension)    Osteopenia    PAF (paroxysmal atrial fibrillation) (HCC)    has been on Flecainide in the past. Stopped 02/10/11; Remains on coumadin anticoagulation; intol to Rythmol and failed DCCV; noted to be in AFlutter 5/13;  Echo 01/2011:  EF 55-60%, mild MR, mild LAE.  Myoview 6/12:  No ischemia, EF 74%   Pericardial effusion    Pericardial effusion 02/23/2012   Shortness of breath    TIA (transient ischemic attack)     Past Surgical History:  Procedure Laterality Date   APPENDECTOMY     CARDIOVERSION  01/14/2011    Procedure: CARDIOVERSION;  Surgeon: Duke Salvia, MD;  Location: [facility] OR;  Service: Cardiovascular;  Laterality: N/A;  To be completed in Neuro OR 33 time slot 0830 12/20   CARDIOVERSION  05/27/2011   Procedure: CARDIOVERSION;  Surgeon: Duke Salvia, MD;  Location: [facility] OR;  Service: Cardiovascular;  Laterality: N/A;   CATARACT EXTRACTION Bilateral    PERICARDIAL WINDOW  02/25/2012   Procedure: PERICARDIAL WINDOW;  Surgeon: Delight Ovens, MD;  Location: [facility] OR;  Service: Thoracic;  Laterality: N/A;   POLYPECTOMY     skin cancer removal     TEE WITHOUT CARDIOVERSION  02/25/2012   Procedure: TRANSESOPHAGEAL ECHOCARDIOGRAM (TEE);  Surgeon: Delight Ovens, MD;  Location: [facility] OR;  Service: Thoracic;  Laterality: N/A;   TOTAL HYSTERECTOMY AND BILATERAL SALPINGOOPHERECTOMY      Current Outpatient Medications  Medication Sig Dispense Refill   Cholecalciferol (VITAMIN D3) 2000 units capsule Take 1 capsule (2,000 Units total) by mouth daily. 100 capsule 3   denosumab (PROLIA) 60 MG/ML SOSY injection Inject 60 mg into the skin every 6 (six) months.     diltiazem (CARDIZEM CD) 240 MG 24 hr capsule TAKE 1 CAPSULE(240 MG) BY MOUTH DAILY 90 capsule 2   furosemide (LASIX)  20 MG tablet Take 0.5 tablets (10 mg total) by mouth as needed. 45 tablet 3   nebivolol (BYSTOLIC) 5 MG tablet Take 1 tablet (5 mg total) by mouth daily. 90 tablet 3   ramipril (ALTACE) 10 MG capsule Take 1 capsule (10 mg total) by mouth daily. 30 capsule 12   Rivaroxaban (XARELTO) 15 MG TABS tablet TAKE 1 TABLET(15 MG) BY MOUTH DAILY WITH SUPPER 90 tablet 1   tiZANidine (ZANAFLEX) 4 MG tablet Take 0.5-1 tablets (2-4 mg total) by mouth every 8 (eight) hours as needed for muscle spasms. 60 tablet 1   traMADol (ULTRAM) 50 MG tablet TAKE 1/2 TO 1 TABLET(25 TO 50 MG) BY MOUTH EVERY 6 HOURS AS NEEDED FOR SEVERE PAIN 100 tablet 3   zolpidem (AMBIEN) 10 MG tablet TAKE 1 TABLET(10 MG) BY MOUTH AT BEDTIME AS NEEDED FOR SLEEP 60 tablet 2   No  current facility-administered medications for this visit.    Allergies  Allergen Reactions   Other Other (See Comments)    Novocaine.  REACTION:  Rapid heart rate   Procaine Hcl     Rapid heart rate. The pt can tolerate Lidocaine OK.    Review of Systems negative except from HPI and PMH  Physical Exam: BP 102/60   Pulse (!) 58   Ht 5\' 2"  (1.575 m)   Wt 125 lb 9.6 oz (57 kg)   SpO2 97%   BMI 22.97 kg/m  Well developed and well nourished in no acute distress HENT normal Neck supple with JVP-flat Lungs Clear Device pocket well healed; without hematoma or erythema.  There is no tethering  Irregularly irregular rate and rhythm no murmur Abd-soft with active BS No Clubbing cyanosis tr edema Skin-warm and dry A & Oriented  Grossly normal sensory and motor function      EKG: A. fib at 58 -/08/41  Walked in the office heart rate increased to 66     Assessment and  Plan  Atrial fibrillation with a Controlled ventricular response   Renal insufficiency   Bradycardia question chronotropic incompetence  Hypertension    Blood pressure well controlled.  With fatigue, I wonder whether it is too low and she may need to back off on some of her medication; given chronotropic incompetence, we will start by decreasing her nebivolol from 5--2.5 mg daily.  I have asked her to check her blood pressure once a week as well as to record her heart rate using Alive[employer] monitor once a week or so walking on a specified path i.e. to the mailbox and back twice.  This will help 07-09-1970 to better assess chronotropic competence; here in the office today, she appears to be incompetent with a heart rate with walking rather briskly of only 66.  Hence the nebivolol adjustment above  Atrial fibrillation is permanent.  Continue anticoagulation with rivaroxaban at 15 mg.  No clinical bleeding.  We will check a CBC  Renal insufficiency remains variable creatinine is about 1.16 up about 20% since last year

## 2020-08-13 NOTE — Patient Instructions (Signed)
Medication Instructions:   Your physician has recommended you make the following change in your medication:   ** Begin Bystolic 5mg  - 1/2 tablet by mouth daily  ** Begin Furosemide 40mg  - 1 tablet by mouth daily x 3 days then you will take only as needed.  . *If you need a refill on your cardiac medications before your next appointment, please call your pharmacy*   Lab Work: CBC today If you have labs (blood work) drawn today and your tests are completely normal, you will receive your results only by: MyChart Message (if you have MyChart) OR A paper copy in the mail If you have any lab test that is abnormal or we need to change your treatment, we will call you to review the results.   Testing/Procedures: None ordered.    Follow-Up: At [facility], you and your health needs are our priority.  As part of our continuing mission to provide you with exceptional heart care, we have created designated Provider Care Teams.  These Care Teams include your primary Cardiologist (physician) and Advanced Practice Providers (APPs -  Physician Assistants and Nurse Practitioners) who all work together to provide you with the care you need, when you need it.  We recommend signing up for the patient portal called "MyChart".  Sign up information is provided on this After Visit Summary.  MyChart is used to connect with patients for Virtual Visits (Telemedicine).  Patients are able to view lab/test results, encounter notes, upcoming appointments, etc.  Non-urgent messages can be sent to your provider as well.   To learn more about what you can do with MyChart, go to .    Your next appointment:   3 month(s)  The format for your next appointment:   In Person  Provider:   [facility], MD   Other Instructions Please check your BP weekly

## 2020-08-15 ENCOUNTER — Other Ambulatory Visit: Payer: Self-pay

## 2020-08-15 ENCOUNTER — Other Ambulatory Visit: Payer: Medicare Other

## 2020-08-16 LAB — CBC

## 2020-08-19 ENCOUNTER — Telehealth: Payer: Self-pay | Admitting: Internal Medicine

## 2020-08-19 DIAGNOSIS — I480 Paroxysmal atrial fibrillation: Secondary | ICD-10-CM

## 2020-08-19 NOTE — Telephone Encounter (Signed)
Follow Up:      Patient said she could not see lab results in My Chart, they were blurred. Would you please send again.

## 2020-08-19 NOTE — Telephone Encounter (Signed)
Pt's specimen was insufficient for results. She agrees to come back in for a redraw. Order and appt has been made.

## 2020-08-21 ENCOUNTER — Other Ambulatory Visit: Payer: Self-pay

## 2020-08-21 ENCOUNTER — Other Ambulatory Visit: Payer: Medicare Other | Admitting: *Deleted

## 2020-08-21 DIAGNOSIS — I480 Paroxysmal atrial fibrillation: Secondary | ICD-10-CM | POA: Diagnosis not present

## 2020-08-21 LAB — CBC
Hematocrit: 38 % (ref 34.0–46.6)
Hemoglobin: 12.9 g/dL (ref 11.1–15.9)
MCH: 31.9 pg (ref 26.6–33.0)
MCHC: 33.9 g/dL (ref 31.5–35.7)
MCV: 94 fL (ref 79–97)
Platelets: 201 10*3/uL (ref 150–450)
RBC: 4.05 x10E6/uL (ref 3.77–5.28)
RDW: 12.8 % (ref 11.7–15.4)
WBC: 5.9 10*3/uL (ref 3.4–10.8)

## 2020-08-21 MED ORDER — RIVAROXABAN 15 MG PO TABS: ORAL_TABLET | ORAL | 1 refills | Status: DC

## 2020-08-21 NOTE — Telephone Encounter (Signed)
Prescription refill request for Xarelto received.  Indication: Afib Last office visit: 08/13/20 Selena Taylor  Weight: 57kg Age: 85 Scr: 1.16 (04/21/20) CrCl: 33.12ml/min  Appropriate dose and refill send to requested pharmacy

## 2020-08-22 ENCOUNTER — Other Ambulatory Visit: Payer: Self-pay | Admitting: Internal Medicine

## 2020-09-03 ENCOUNTER — Ambulatory Visit (INDEPENDENT_AMBULATORY_CARE_PROVIDER_SITE_OTHER): Payer: Medicare Other | Admitting: Internal Medicine

## 2020-09-03 ENCOUNTER — Encounter: Payer: Self-pay | Admitting: Internal Medicine

## 2020-09-03 ENCOUNTER — Ambulatory Visit (INDEPENDENT_AMBULATORY_CARE_PROVIDER_SITE_OTHER): Payer: Medicare Other

## 2020-09-03 ENCOUNTER — Other Ambulatory Visit: Payer: Self-pay

## 2020-09-03 DIAGNOSIS — I480 Paroxysmal atrial fibrillation: Secondary | ICD-10-CM

## 2020-09-03 DIAGNOSIS — R21 Rash and other nonspecific skin eruption: Secondary | ICD-10-CM

## 2020-09-03 DIAGNOSIS — R5383 Other fatigue: Secondary | ICD-10-CM | POA: Diagnosis not present

## 2020-09-03 DIAGNOSIS — R5382 Chronic fatigue, unspecified: Secondary | ICD-10-CM | POA: Diagnosis not present

## 2020-09-03 DIAGNOSIS — I517 Cardiomegaly: Secondary | ICD-10-CM | POA: Diagnosis not present

## 2020-09-03 LAB — URINALYSIS, ROUTINE W REFLEX MICROSCOPIC
Bilirubin Urine: NEGATIVE
Hgb urine dipstick: NEGATIVE
Ketones, ur: NEGATIVE
Nitrite: NEGATIVE
RBC / HPF: NONE SEEN (ref 0–?)
Specific Gravity, Urine: 1.025 (ref 1.000–1.030)
Total Protein, Urine: NEGATIVE
Urine Glucose: NEGATIVE
Urobilinogen, UA: 0.2 (ref 0.0–1.0)
pH: 6 (ref 5.0–8.0)

## 2020-09-03 LAB — COMPREHENSIVE METABOLIC PANEL
ALT: 21 U/L (ref 0–35)
AST: 31 U/L (ref 0–37)
Albumin: 4.3 g/dL (ref 3.5–5.2)
Alkaline Phosphatase: 51 U/L (ref 39–117)
BUN: 21 mg/dL (ref 6–23)
CO2: 25 mEq/L (ref 19–32)
Calcium: 9.4 mg/dL (ref 8.4–10.5)
Chloride: 106 mEq/L (ref 96–112)
Creatinine, Ser: 0.99 mg/dL (ref 0.40–1.20)
GFR: 52.99 mL/min — ABNORMAL LOW (ref >60.00)
Glucose, Bld: 86 mg/dL (ref 70–99)
Potassium: 4.2 mEq/L (ref 3.5–5.1)
Sodium: 141 mEq/L (ref 135–145)
Total Bilirubin: 0.6 mg/dL (ref 0.2–1.2)
Total Protein: 7.3 g/dL (ref 6.0–8.3)

## 2020-09-03 LAB — SEDIMENTATION RATE: Sed Rate: 24 mm/hr (ref 0–30)

## 2020-09-03 LAB — TSH: TSH: 1.23 u[IU]/mL (ref 0.35–5.50)

## 2020-09-03 LAB — T4, FREE: Free T4: 0.93 ng/dL (ref 0.60–1.60)

## 2020-09-03 IMAGING — DX DG CHEST 2V
2 series · 2 of 2 positions shown · non-contrast
Comparison: [DATE].

CLINICAL DATA: Fatigue.

EXAM:
CHEST - 2 VIEW

[chest pa]
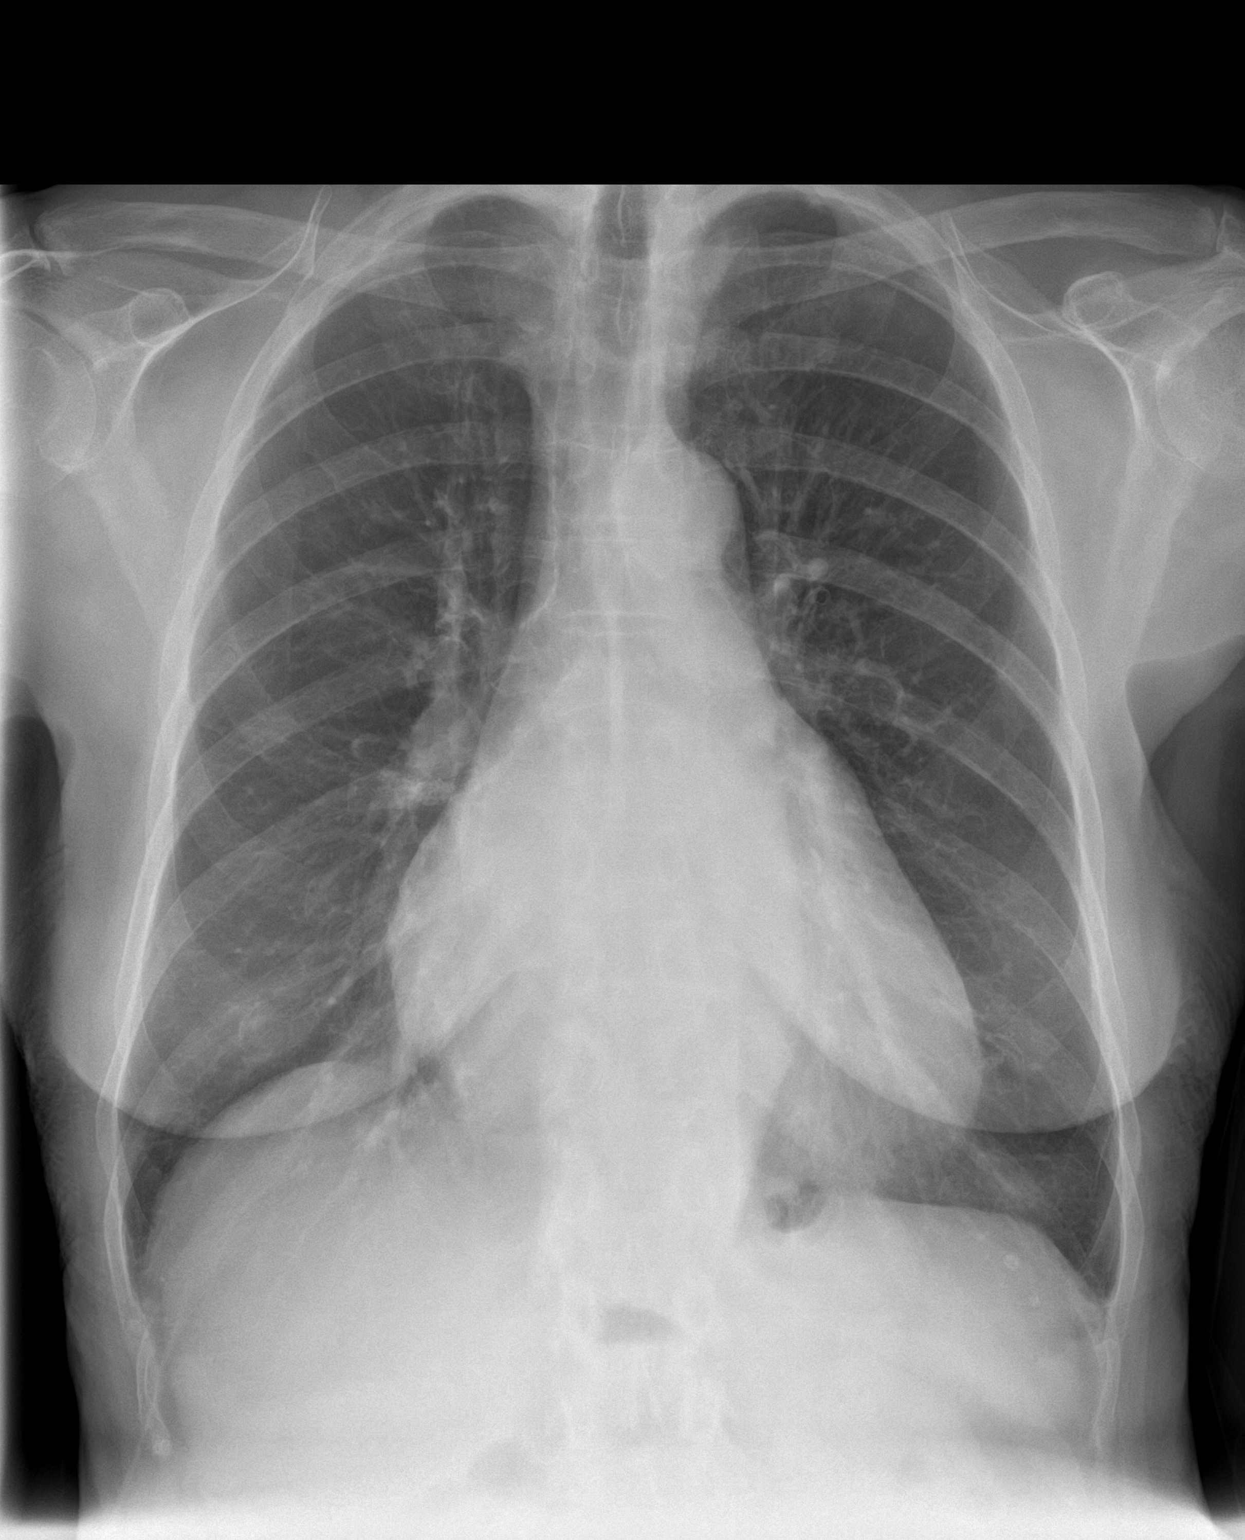

[chest lat]
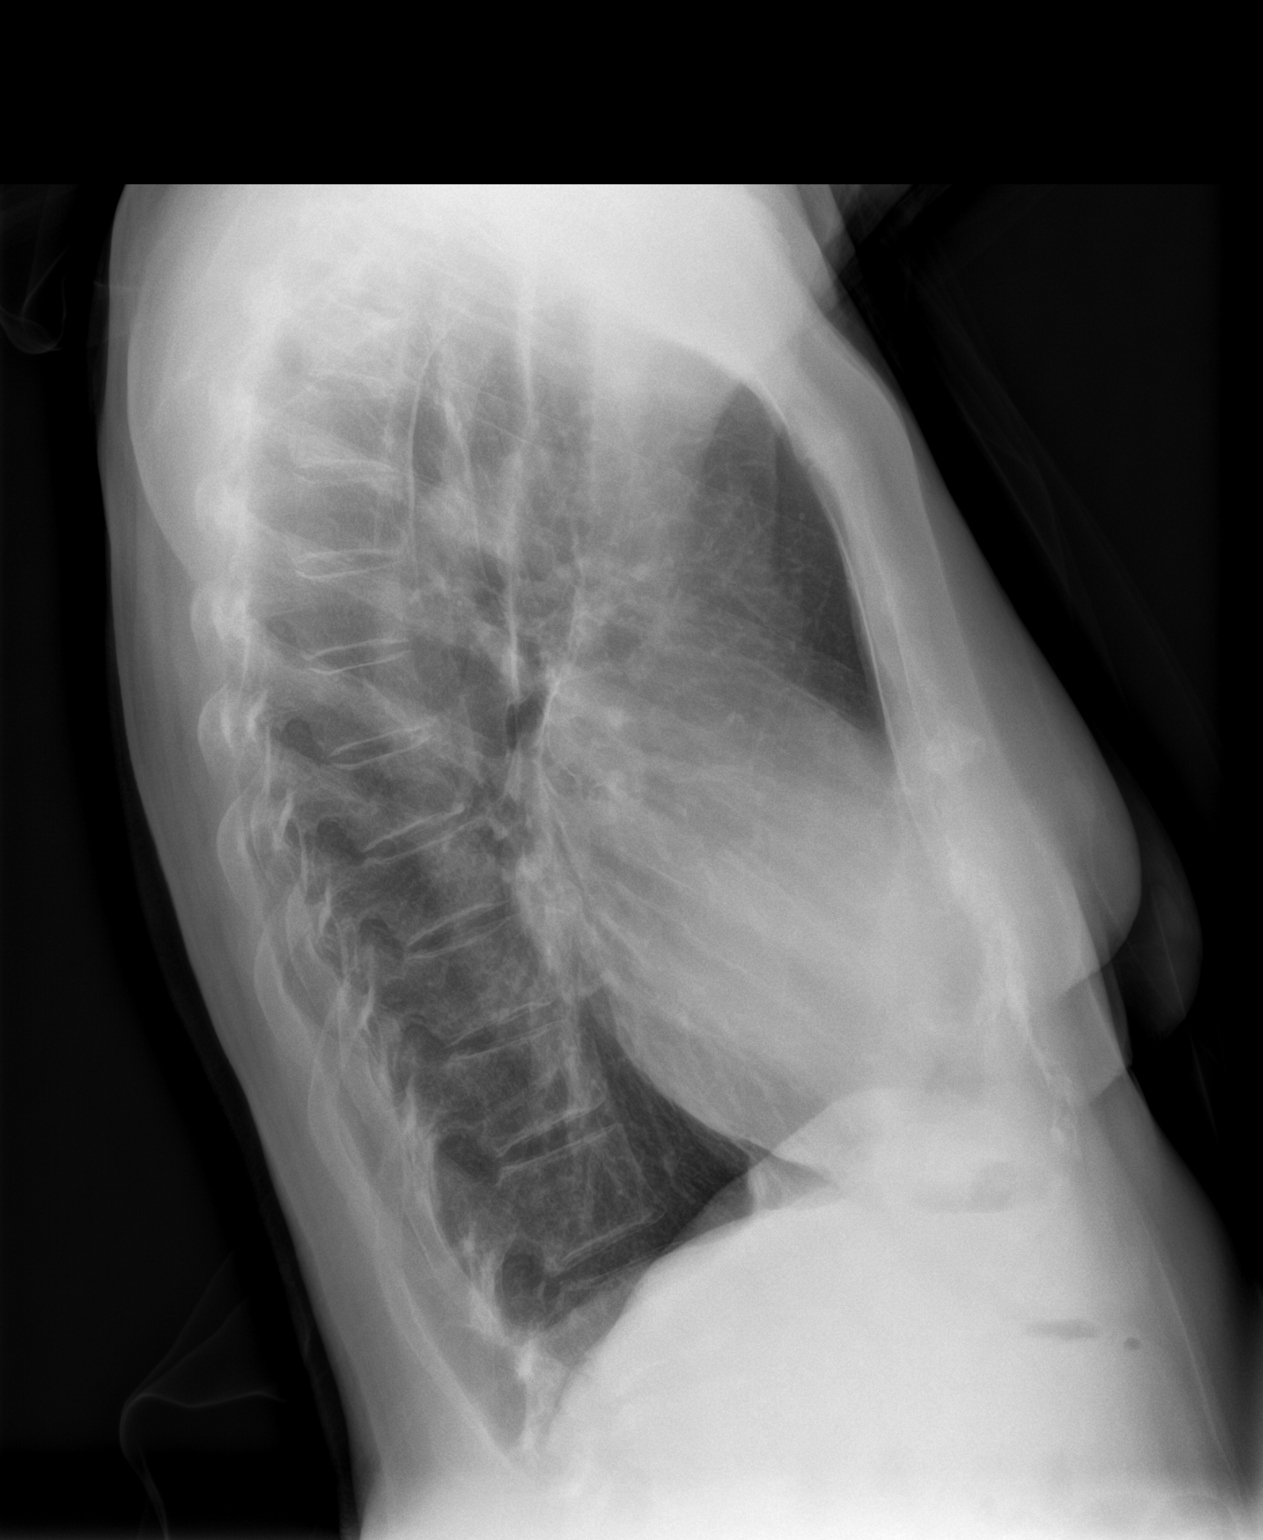

[2 of 2 positions shown; findings below may reference images not displayed]

FINDINGS: Stable cardiomegaly. Both lungs are clear. The visualized skeletal
structures are unremarkable.
IMPRESSION: No active cardiopulmonary disease.

## 2020-09-03 MED ORDER — TRIAMCINOLONE ACETONIDE 0.1 % EX OINT: 1.0000 "application " | TOPICAL_OINTMENT | Freq: Two times a day (BID) | CUTANEOUS | 2 refills | Status: DC

## 2020-09-03 NOTE — Assessment & Plan Note (Addendum)
Chronic fatigue.  Unclear etiology.  Will obtain a chest x-ray.  Check TSH, FT4 and labs

## 2020-09-03 NOTE — Patient Instructions (Addendum)
No make up  Do not wash your face - use water

## 2020-09-03 NOTE — Progress Notes (Signed)
Subjective:  Patient ID: Selena Taylor, female    DOB: [birth year]  Age: 85 y.o. MRN: [id number]  CC: Rash (Pt states rash is on her face x's 6 weeks. Went to dermatologist they rx hydrocortisone 2% oint.Marland Kitchen still no better )   HPI Selena Taylor presents for rash on face x 6 wks - itchy Pt saw derm twice - on HCT cream for Rosacea CBC - OK 2 wks ago F/u A fib - bystolic was reduced due to low BP    Outpatient Medications Prior to Visit  Medication Sig Dispense Refill   Cholecalciferol (VITAMIN D3) 2000 units capsule Take 1 capsule (2,000 Units total) by mouth daily. 100 capsule 3   denosumab (PROLIA) 60 MG/ML SOSY injection Inject 60 mg into the skin every 6 (six) months.     diltiazem (CARDIZEM CD) 240 MG 24 hr capsule TAKE 1 CAPSULE(240 MG) BY MOUTH DAILY 90 capsule 2   furosemide (LASIX) 40 MG tablet Take 1 tablet (40 mg total) by mouth as needed. 30 tablet 0   hydrocortisone 2.5 % ointment Apply topically 2 (two) times daily.     nebivolol (BYSTOLIC) 5 MG tablet Take 0.5 tablets (2.5 mg total) by mouth daily. 45 tablet 3   ramipril (ALTACE) 10 MG capsule TAKE 1 CAPSULE(10 MG) BY MOUTH DAILY 30 capsule 11   Rivaroxaban (XARELTO) 15 MG TABS tablet TAKE 1 TABLET(15 MG) BY MOUTH DAILY WITH SUPPER 90 tablet 1   zolpidem (AMBIEN) 10 MG tablet TAKE 1 TABLET(10 MG) BY MOUTH AT BEDTIME AS NEEDED FOR SLEEP 60 tablet 2   No facility-administered medications prior to visit.    ROS: Review of Systems  Constitutional:  Positive for fatigue. Negative for activity change, appetite change, chills and unexpected weight change.  HENT:  Negative for congestion, mouth sores and sinus pressure.   Eyes:  Negative for visual disturbance.  Respiratory:  Negative for cough and chest tightness.   Gastrointestinal:  Negative for abdominal pain and nausea.  Genitourinary:  Negative for difficulty urinating, frequency and vaginal pain.  Musculoskeletal:  Negative for back pain and gait problem.  Skin:   Positive for rash. Negative for pallor.  Neurological:  Negative for dizziness, tremors, weakness, numbness and headaches.  Psychiatric/Behavioral:  Negative for confusion and sleep disturbance.    Objective:  BP 138/90 (BP Location: Left Arm)   Pulse 91   Temp 98.1 F (36.7 C) (Oral)   Ht 5\' 2"  (1.575 m)   Wt 127 lb 9.6 oz (57.9 kg)   SpO2 96%   BMI 23.34 kg/m   BP Readings from Last 3 Encounters:  09/03/20 138/90  08/13/20 102/60  04/11/20 120/84    Wt Readings from Last 3 Encounters:  09/03/20 127 lb 9.6 oz (57.9 kg)  08/13/20 125 lb 9.6 oz (57 kg)  04/11/20 124 lb 9.6 oz (56.5 kg)    Physical Exam Constitutional:      General: She is not in acute distress.    Appearance: She is well-developed.  HENT:     Head: Normocephalic.     Right Ear: External ear normal.     Left Ear: External ear normal.     Nose: Nose normal.  Eyes:     General:        Right eye: No discharge.        Left eye: No discharge.     Conjunctiva/sclera: Conjunctivae normal.     Pupils: Pupils are equal, round, and reactive to light.  Neck:     Thyroid: No thyromegaly.     Vascular: No JVD.     Trachea: No tracheal deviation.  Cardiovascular:     Rate and Rhythm: Normal rate and regular rhythm.     Heart sounds: Normal heart sounds.  Pulmonary:     Effort: No respiratory distress.     Breath sounds: No stridor. No wheezing.  Abdominal:     General: Bowel sounds are normal. There is no distension.     Palpations: Abdomen is soft. There is no mass.     Tenderness: There is no abdominal tenderness. There is no guarding or rebound.  Musculoskeletal:        General: No tenderness.     Cervical back: Normal range of motion and neck supple.  Lymphadenopathy:     Cervical: No cervical adenopathy.  Skin:    Findings: Erythema and rash present.  Neurological:     Cranial Nerves: No cranial nerve deficit.     Motor: No abnormal muscle tone.     Coordination: Coordination normal.     Deep  Tendon Reflexes: Reflexes normal.  Psychiatric:        Behavior: Behavior normal.        Thought Content: Thought content normal.        Judgment: Judgment normal.  Dry erythematous scaly skin on the face especially around the eyes, on both cheeks and on the chin  Lab Results  Component Value Date   WBC 5.9 08/21/2020   HGB 12.9 08/21/2020   HCT 38.0 08/21/2020   PLT 201 08/21/2020   GLUCOSE 83 04/21/2020   CHOL 163 10/15/2014   TRIG 93.0 10/15/2014   HDL 79.80 10/15/2014   LDLCALC 65 10/15/2014   ALT 14 06/16/2016   AST 21 06/16/2016   NA 140 04/21/2020   K 4.7 04/21/2020   CL 101 04/21/2020   CREATININE 1.16 (H) 04/21/2020   BUN 32 (H) 04/21/2020   CO2 22 04/21/2020   TSH 1.650 06/16/2016   INR 2.03 (H) 02/29/2012   HGBA1C 6.5 01/28/2014    MR Lumbar Spine Wo Contrast  Result Date: 05/28/2019 CLINICAL DATA:  Chronic low back pain. EXAM: MRI LUMBAR SPINE WITHOUT CONTRAST TECHNIQUE: Multiplanar, multisequence MR imaging of the lumbar spine was performed. No intravenous contrast was administered. COMPARISON:  Lumbar radiographs 02/17/2018 FINDINGS: Segmentation: There are five lumbar type vertebral bodies. The last full intervertebral disc space is labeled L5-S1. This correlates with the radiographs. Alignment: Normal overall alignment. Very slight degenerative anterolisthesis of L3 and L4. Vertebrae: Endplate reactive changes but no worrisome bone lesions or fractures. Conus medullaris and cauda equina: Conus extends to the L2 level. Conus and cauda equina appear normal. Paraspinal and other soft tissues: No significant findings. Disc levels: T12-L1: No significant findings. L1-2: Mild facet disease but no disc protrusions, spinal or foraminal stenosis. L2-3: Moderate degenerative disc disease with a bulging degenerated annulus and osteophytic ridging. There is also moderate facet disease and the combination contributes to mild to moderate bilateral lateral recess stenosis. Mild  foraminal encroachment bilaterally also. L3-4: Advanced degenerative disc disease and mild facet disease. Bulging annulus with flattening of the ventral thecal sac. There is also mild foraminal encroachment bilaterally. L4-5: Bulging and slightly uncovered disc with slight flattening of the ventral thecal sac. There is also moderate facet disease and slight thickening and buckling of the ligamentum flavum third no significant spinal stenosis. Early spinal and mild bilateral lateral recess stenosis. No foraminal stenosis. L5-S1: Moderate to  advanced facet disease and slight diffuse bulging annulus but no significant spinal or foraminal stenosis. IMPRESSION: 1. Degenerative lumbar spondylosis with multilevel disc disease and facet disease. 2. Mild to moderate bilateral lateral recess stenosis and mild bilateral foraminal encroachment at L2-3. 3. Mild bilateral foraminal encroachment at L3-4. 4. Early spinal and mild bilateral lateral recess stenosis at L4-5. Electronically Signed   By: Rudie Meyer M.D.   On: 05/28/2019 14:57    Assessment & Plan:     Follow-up: No follow-ups on file.  Sonda Primes, MD

## 2020-09-03 NOTE — Assessment & Plan Note (Addendum)
  On Xarelto, Bystolic, Cartia - bystolic was reduced due to low BP Get CMET, CXR

## 2020-09-03 NOTE — Assessment & Plan Note (Addendum)
Rosacea vs other, ie dermatomyositis ?etiology D/c make-up, sun exposure Triamc oint 0.1% prescribed CBC, CMET, thyroid tests CXR

## 2020-09-12 ENCOUNTER — Ambulatory Visit (INDEPENDENT_AMBULATORY_CARE_PROVIDER_SITE_OTHER): Payer: Medicare Other

## 2020-09-12 ENCOUNTER — Telehealth: Payer: Self-pay | Admitting: Internal Medicine

## 2020-09-12 ENCOUNTER — Other Ambulatory Visit: Payer: Self-pay | Admitting: Internal Medicine

## 2020-09-12 ENCOUNTER — Other Ambulatory Visit: Payer: Self-pay

## 2020-09-12 DIAGNOSIS — Z0000 Encounter for general adult medical examination without abnormal findings: Secondary | ICD-10-CM

## 2020-09-12 MED ORDER — NEBIVOLOL HCL 5 MG PO TABS
2.5000 mg | ORAL_TABLET | Freq: Every day | ORAL | 8 refills | Status: DC
Start: 2020-09-12 — End: 2021-05-04

## 2020-09-12 NOTE — Progress Notes (Addendum)
Subjective:   Selena Taylor is a 85 y.o. female who presents for Medicare Annual (Subsequent) preventive examination.  Review of Systems     Cardiac Risk Factors include: advanced age (>68men, >57 women);family history of premature cardiovascular disease;hypertension     Objective:    There were no vitals filed for this visit. There is no height or weight on file to calculate BMI.  Advanced Directives 09/12/2020 12/13/2018 03/22/2018 05/24/2016 10/07/2014 02/25/2012 02/23/2012  Does Patient Have a Medical Advance Directive? Yes Yes Yes Yes Yes Patient has advance directive, copy not in chart Patient has advance directive, copy not in chart  Type of Advance Directive Living will;Healthcare Power of [employer] Power of [place];Living will Healthcare Power of [place];Living will Healthcare Power of [place];Living will - Healthcare Power of [place];Living will -  Does patient want to make changes to medical advance directive? No - Patient declined - - - - - -  Copy of Healthcare Power of Attorney in Chart? No - copy requested No - copy requested No - copy requested No - copy requested Yes Copy requested from other (Comment) Copy requested from other (Comment)  Pre-existing out of facility DNR order (yellow form or pink MOST form) - - - - - No -    Current Medications (verified) Outpatient Encounter Medications as of 09/12/2020  Medication Sig   Cholecalciferol (VITAMIN D3) 2000 units capsule Take 1 capsule (2,000 Units total) by mouth daily.   denosumab (PROLIA) 60 MG/ML SOSY injection Inject 60 mg into the skin every 6 (six) months.   diltiazem (CARDIZEM CD) 240 MG 24 hr capsule TAKE 1 CAPSULE(240 MG) BY MOUTH DAILY   furosemide (LASIX) 40 MG tablet Take 1 tablet (40 mg total) by mouth as needed.   hydrocortisone 2.5 % ointment Apply topically 2 (two) times daily.   nebivolol (BYSTOLIC) 5 MG tablet Take 0.5 tablets (2.5 mg total) by mouth daily.   ramipril (ALTACE) 10 MG capsule  TAKE 1 CAPSULE(10 MG) BY MOUTH DAILY   Rivaroxaban (XARELTO) 15 MG TABS tablet TAKE 1 TABLET(15 MG) BY MOUTH DAILY WITH SUPPER   triamcinolone ointment (KENALOG) 0.1 % Apply 1 application topically 2 (two) times daily.   zolpidem (AMBIEN) 10 MG tablet TAKE 1 TABLET(10 MG) BY MOUTH AT BEDTIME AS NEEDED FOR SLEEP   No facility-administered encounter medications on file as of 09/12/2020.    Allergies (verified) Other and Procaine hcl   History: Past Medical History:  Diagnosis Date   Anxiety    CHF (congestive heart failure) (HCC)    Chronic anticoagulation    on coumadin   Endometriosis    HTN (hypertension)    Osteopenia    PAF (paroxysmal atrial fibrillation) (HCC)    has been on Flecainide in the past. Stopped 02/10/11; Remains on coumadin anticoagulation; intol to Rythmol and failed DCCV; noted to be in AFlutter 5/13;  Echo 01/2011:  EF 55-60%, mild MR, mild LAE.  Myoview 6/12:  No ischemia, EF 74%   Pericardial effusion    Pericardial effusion 02/23/2012   Shortness of breath    TIA (transient ischemic attack)    Past Surgical History:  Procedure Laterality Date   APPENDECTOMY     CARDIOVERSION  01/14/2011   Procedure: CARDIOVERSION;  Surgeon: Duke Salvia, MD;  Location: [facility] OR;  Service: Cardiovascular;  Laterality: N/A;  To be completed in Neuro OR 33 time slot 0830 12/20   CARDIOVERSION  05/27/2011   Procedure: CARDIOVERSION;  Surgeon: Duke Salvia, MD;  Location: [facility] OR;  Service: Cardiovascular;  Laterality: N/A;   CATARACT EXTRACTION Bilateral    PERICARDIAL WINDOW  02/25/2012   Procedure: PERICARDIAL WINDOW;  Surgeon: Delight OvensEdward B Gerhardt, MD;  [facility];  Service: Thoracic;  Laterality: N/A;   POLYPECTOMY     skin cancer removal     TEE WITHOUT CARDIOVERSION  02/25/2012   Procedure: TRANSESOPHAGEAL ECHOCARDIOGRAM (TEE);  Surgeon: Delight OvensEdward B Gerhardt, MD;  [facility];  Service: Thoracic;  Laterality: N/A;   TOTAL HYSTERECTOMY AND BILATERAL SALPINGOOPHERECTOMY      Family History  Problem Relation Age of Onset   Cancer Brother        myeloma   Heart disease Other        Father age 83   Social History   Socioeconomic History   Marital status: Widowed    Spouse name: Not on file   Number of children: 1   Years of education: Not on file   Highest education level: Not on file  Occupational History   Occupation: retired  Tobacco Use   Smoking status: Never   Smokeless tobacco: Never  Vaping Use   Vaping Use: Never used  Substance and Sexual Activity   Alcohol use: Yes    Alcohol/week: 14.0 standard drinks    Types: 14 Glasses of wine per week    Comment: 1-2 glasses of wine nightly   Drug use: No   Sexual activity: Not Currently  Other Topics Concern   Not on file  Social History Narrative   HSG; Became a stewardness..   Married - 1959.Marland Kitchen.   1 son - '65; 1 daughter '60; 2 grandchildren..   Occupation: Retired..   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   End of life Care: no DNR, DNI, no futile or heroic measures.            Social Determinants of Health   Financial Resource Strain: Low Risk    Difficulty of Paying Living Expenses: Not hard at all  Food Insecurity: No Food Insecurity   Worried About Programme researcher, broadcasting/film/videounning Out of Food in the Last Year: Never true   Ran Out of Food in the Last Year: Never true  Transportation Needs: No Transportation Needs   Lack of Transportation (Medical): No   Lack of Transportation (Non-Medical): No  Physical Activity: Sufficiently Active   Days of Exercise per Week: 5 days   Minutes of Exercise per Session: 30 min  Stress: No Stress Concern Present   Feeling of Stress : Not at all  Social Connections: Moderately Integrated   Frequency of Communication with Friends and Family: More than three times a week   Frequency of Social Gatherings with Friends and Family: More than three times a week   Attends Religious Services: More than 4 times per year   Active Member of [employer] or Organizations: Yes    Attends BankerClub or Organization Meetings: More than 4 times per year   Marital Status: Widowed    Tobacco Counseling Counseling given: Not Answered   Clinical Intake:  Pre-visit preparation completed: Yes  Pain : No/denies pain     Nutritional Risks: None Diabetes: No  How often do you need to have someone help you when you read instructions, pamphlets, or other written materials from your doctor or pharmacy?: 1 - Never What is the last grade level you completed in school?: 2 years of college  Diabetic? no  Interpreter Needed?: No  Information entered by :: Susie CassetteShenika Idaly Verret, LPN  Activities of Daily Living In your present state of health, do you have any difficulty performing the following activities: 09/12/2020  Hearing? N  Vision? N  Difficulty concentrating or making decisions? N  Walking or climbing stairs? N  Dressing or bathing? N  Doing errands, shopping? N  Preparing Food and eating ? N  Using the Toilet? N  In the past six months, have you accidently leaked urine? N  Do you have problems with loss of bowel control? N  Managing your Medications? N  Managing your Finances? N  Housekeeping or managing your Housekeeping? N  Some recent data might be hidden    Patient Care Team: Plotnikov, Georgina Quint, MD as PCP - General (Internal Medicine) Duke Salvia, MD as PCP - Cardiology (Cardiology) Duke Salvia, MD as PCP - Electrophysiology (Cardiology) Duke Salvia, MD as Consulting Physician (Cardiology) Venancio Poisson, MD as Consulting Physician (Dermatology) Mckinley Jewel, MD as Consulting Physician (Ophthalmology) Janet Berlin, MD as Consulting Physician (Ophthalmology)  Indicate any recent Medical Services you may have received from other than [facility] providers in the past year (date may be approximate).     Assessment:   This is a routine wellness examination for Selena Taylor.  Hearing/Vision screen Hearing Screening - Comments:: Patient denied any  hearing difficulty. Vision Screening - Comments:: Lens implants done.  Annual eye exam done by Mckinley Jewel, MD.    Dietary issues and exercise activities discussed: Current Exercise Habits: Home exercise routine, Type of exercise: walking, Time (Minutes): 30, Frequency (Times/Week): 5, Weekly Exercise (Minutes/Week): 150, Intensity: Moderate, Exercise limited by: orthopedic condition(s)   Goals Addressed               This Visit's Progress     Client understands the importance of follow-up with providers by attending scheduled visits (pt-stated)        My goal in to increase my physical activity by walking more.      Depression Screen PHQ 2/9 Scores 09/12/2020 12/13/2018 11/23/2017 05/24/2016 10/07/2014  PHQ - 2 Score 0 0 0 0 0    Fall Risk Fall Risk  09/12/2020 12/13/2018 11/23/2017 05/24/2016 10/07/2014  Falls in the past year? 0 0 No No No  Number falls in past yr: 0 0 - - -  Injury with Fall? 0 0 - - -  Risk for fall due to : No Fall Risks - - - -  Follow up Falls evaluation completed - - - -    FALL RISK PREVENTION PERTAINING TO THE HOME:  Any stairs in or around the home? No  If so, are there any without handrails? No  Home free of loose throw rugs in walkways, pet beds, electrical cords, etc? Yes  Adequate lighting in your home to reduce risk of falls? Yes   ASSISTIVE DEVICES UTILIZED TO PREVENT FALLS:  Life alert? No  Use of a cane, walker or w/c? No  Grab bars in the bathroom? No  Shower chair or bench in shower? No  Elevated toilet seat or a handicapped toilet? No   TIMED UP AND GO:  Was the test performed? No .  Length of time to ambulate 10 feet: n/a sec.   Gait steady and fast without use of assistive device  Cognitive Function: Normal cognitive status assessed by direct observation by this Nurse Health Advisor. No abnormalities found.          Immunizations Immunization History  Administered Date(s) Administered   Fluad Quad(high Dose 65+)  10/23/2018, 11/16/2019  Influenza Split 11/18/2011   Influenza Whole 12/08/2007, 11/18/2008, 11/17/2009   Influenza, High Dose Seasonal PF 11/08/2012, 10/29/2013, 10/07/2014, 10/27/2015, 11/18/2016, 10/17/2017   PFIZER(Purple Top)SARS-COV-2 Vaccination 03/05/2019, 03/30/2019   Pneumococcal Conjugate-13 08/14/2014   Pneumococcal Polysaccharide-23 11/27/2002, 06/04/2016   Td 08/11/2009   Zoster, Live 02/18/2009    TDAP status: Due, Education has been provided regarding the importance of this vaccine. Advised may receive this vaccine at local pharmacy or Health Dept. Aware to provide a copy of the vaccination record if obtained from local pharmacy or Health Dept. Verbalized acceptance and understanding.  Flu Vaccine status: Up to date  Pneumococcal vaccine status: Up to date  Covid-19 vaccine status: Completed vaccines  Qualifies for Shingles Vaccine? Yes   Zostavax completed Yes   Shingrix Completed?: No.    Education has been provided regarding the importance of this vaccine. Patient has been advised to call insurance company to determine out of pocket expense if they have not yet received this vaccine. Advised may also receive vaccine at local pharmacy or Health Dept. Verbalized acceptance and understanding.  Screening Tests Health Maintenance  Topic Date Due   Zoster Vaccines- Shingrix (1 of 2) Never done   COVID-19 Vaccine (3 - Pfizer risk series) 04/27/2019   TETANUS/TDAP  08/12/2019   INFLUENZA VACCINE  08/25/2020   DEXA SCAN  Completed   PNA vac Low Risk Adult  Completed   HPV VACCINES  Aged Out    Health Maintenance  Health Maintenance Due  Topic Date Due   Zoster Vaccines- Shingrix (1 of 2) Never done   COVID-19 Vaccine (3 - Pfizer risk series) 04/27/2019   TETANUS/TDAP  08/12/2019   INFLUENZA VACCINE  08/25/2020    Colorectal cancer screening: No longer required.   Mammogram status: Completed 06/29/2017. Repeat every year  Bone Density status: Completed  06/22/2016. Results reflect: Bone density results: OSTEOPOROSIS. Repeat every 2 years.  Lung Cancer Screening: (Low Dose CT Chest recommended if Age 57-80 years, 30 pack-year currently smoking OR have quit w/in 15years.) does not qualify.   Lung Cancer Screening Referral: no  Additional Screening:  Hepatitis C Screening: does not qualify; Completed no  Vision Screening: Recommended annual ophthalmology exams for early detection of glaucoma and other disorders of the eye. Is the patient up to date with their annual eye exam?  Yes  Who is the provider or what is the name of the office in which the patient attends annual eye exams? Janet Berlin, MD If pt is not established with a provider, would they like to be referred to a provider to establish care? No .   Dental Screening: Recommended annual dental exams for proper oral hygiene  Community Resource Referral / Chronic Care Management: CRR required this visit?  No   CCM required this visit?  No      Plan:     I have personally reviewed and noted the following in the patient's chart:   Medical and social history Use of alcohol, tobacco or illicit drugs  Current medications and supplements including opioid prescriptions.  Functional ability and status Nutritional status Physical activity Advanced directives List of other physicians Hospitalizations, surgeries, and ER visits in previous 12 months Vitals Screenings to include cognitive, depression, and falls Referrals and appointments  In addition, I have reviewed and discussed with patient certain preventive protocols, quality metrics, and best practice recommendations. A written personalized care plan for preventive services as well as general preventive health recommendations were provided to patient.  Mickeal Needy, LPN   10/11/6058   Nurse Notes:  Patient is cogitatively intact. There were no vitals filed for this visit. There is no height or weight on file  to calculate BMI. Patient stated that she has no issues with gait or balance; does not use any assistive devices. Medications reviewed with patient; no opioid use noted.  Medical screening examination/treatment/procedure(s) were performed by non-physician practitioner and as supervising physician I was immediately available for consultation/collaboration.  I agree with above. Jacinta Shoe, MD

## 2020-09-12 NOTE — Telephone Encounter (Signed)
*  STAT* If patient is at the pharmacy, call can be transferred to refill team.   1. Which medications need to be refilled? (please list name of each medication and dose if known) nebivolol (BYSTOLIC) 5 MG tablet   2. Which pharmacy/location (including street and city if local pharmacy) is medication to be sent to? [id number] - [place], [place] - [address] AT [facility] [address]  3. Do they need a 30 day or 90 day supply? 30 ds

## 2020-09-12 NOTE — Patient Instructions (Addendum)
Selena Taylor , Thank you for taking time to come for your Medicare Wellness Visit. I appreciate your ongoing commitment to your health goals. Please review the following plan we discussed and let me know if I can assist you in the future.   Screening recommendations/referrals: Colonoscopy: not a candidate for screening due to age Mammogram: last done 06/29/2017 Bone Density: last done 06/22/2016 Recommended yearly ophthalmology/optometry visit for glaucoma screening and checkup Recommended yearly dental visit for hygiene and checkup  Vaccinations: Influenza vaccine: 11/16/2019 Pneumococcal vaccine: 08/14/2014, 06/04/2016 Tdap vaccine: 08/11/2009; due every 10 years (overdue) Shingles vaccine: never done; Please call your insurance company to determine your out of pocket expense for the Shingrix vaccine. You may receive this vaccine at your local pharmacy. Covid-19:03/05/2019, 03/30/2019  Advanced directives: Please bring a copy of your health care power of attorney and living will to the office at your convenience.  Conditions/risks identified: Yes; Client understands the importance of follow-up with providers by attending scheduled visits and discussed goals to eat healthier, increase physical activity, exercise the brain, socialize more, get enough sleep and make time for laughter.  Next appointment: Please schedule your next Medicare Wellness Visit with your Nurse Health Advisor in 1 year by calling [phone].   Preventive Care 83 Years and Older, Female Preventive care refers to lifestyle choices and visits with your health care provider that can promote health and wellness. What does preventive care include? A yearly physical exam. This is also called an annual well check. Dental exams once or twice a year. Routine eye exams. Ask your health care provider how often you should have your eyes checked. Personal lifestyle choices, including: Daily care of your teeth and gums. Regular physical  activity. Eating a healthy diet. Avoiding tobacco and drug use. Limiting alcohol use. Practicing safe sex. Taking low-dose aspirin every day. Taking vitamin and mineral supplements as recommended by your health care provider. What happens during an annual well check? The services and screenings done by your health care provider during your annual well check will depend on your age, overall health, lifestyle risk factors, and family history of disease. Counseling  Your health care provider may ask you questions about your: Alcohol use. Tobacco use. Drug use. Emotional well-being. Home and relationship well-being. Sexual activity. Eating habits. History of falls. Memory and ability to understand (cognition). Work and work Astronomer. Reproductive health. Screening  You may have the following tests or measurements: Height, weight, and BMI. Blood pressure. Lipid and cholesterol levels. These may be checked every 5 years, or more frequently if you are over 32 years old. Skin check. Lung cancer screening. You may have this screening every year starting at age 74 if you have a 30-pack-year history of smoking and currently smoke or have quit within the past 15 years. Fecal occult blood test (FOBT) of the stool. You may have this test every year starting at age 52. Flexible sigmoidoscopy or colonoscopy. You may have a sigmoidoscopy every 5 years or a colonoscopy every 10 years starting at age 9. Hepatitis C blood test. Hepatitis B blood test. Sexually transmitted disease (STD) testing. Diabetes screening. This is done by checking your blood sugar (glucose) after you have not eaten for a while (fasting). You may have this done every 1-3 years. Bone density scan. This is done to screen for osteoporosis. You may have this done starting at age 61. Mammogram. This may be done every 1-2 years. Talk to your health care provider about how often you should have  regular mammograms. Talk with your  health care provider about your test results, treatment options, and if necessary, the need for more tests. Vaccines  Your health care provider may recommend certain vaccines, such as: Influenza vaccine. This is recommended every year. Tetanus, diphtheria, and acellular pertussis (Tdap, Td) vaccine. You may need a Td booster every 10 years. Zoster vaccine. You may need this after age 40. Pneumococcal 13-valent conjugate (PCV13) vaccine. One dose is recommended after age 72. Pneumococcal polysaccharide (PPSV23) vaccine. One dose is recommended after age 20. Talk to your health care provider about which screenings and vaccines you need and how often you need them. This information is not intended to replace advice given to you by your health care provider. Make sure you discuss any questions you have with your health care provider. Document Released: 02/07/2015 Document Revised: 10/01/2015 Document Reviewed: 11/12/2014 Elsevier Interactive Patient Education  2017 [place] Prevention in the Home Falls can cause injuries. They can happen to people of all ages. There are many things you can do to make your home safe and to help prevent falls. What can I do on the outside of my home? Regularly fix the edges of walkways and driveways and fix any cracks. Remove anything that might make you trip as you walk through a door, such as a raised step or threshold. Trim any bushes or trees on the path to your home. Use bright outdoor lighting. Clear any walking paths of anything that might make someone trip, such as rocks or tools. Regularly check to see if handrails are loose or broken. Make sure that both sides of any steps have handrails. Any raised decks and porches should have guardrails on the edges. Have any leaves, snow, or ice cleared regularly. Use sand or salt on walking paths during winter. Clean up any spills in your garage right away. This includes oil or grease spills. What can I  do in the bathroom? Use night lights. Install grab bars by the toilet and in the tub and shower. Do not use towel bars as grab bars. Use non-skid mats or decals in the tub or shower. If you need to sit down in the shower, use a plastic, non-slip stool. Keep the floor dry. Clean up any water that spills on the floor as soon as it happens. Remove soap buildup in the tub or shower regularly. Attach bath mats securely with double-sided non-slip rug tape. Do not have throw rugs and other things on the floor that can make you trip. What can I do in the bedroom? Use night lights. Make sure that you have a light by your bed that is easy to reach. Do not use any sheets or blankets that are too big for your bed. They should not hang down onto the floor. Have a firm chair that has side arms. You can use this for support while you get dressed. Do not have throw rugs and other things on the floor that can make you trip. What can I do in the kitchen? Clean up any spills right away. Avoid walking on wet floors. Keep items that you use a lot in easy-to-reach places. If you need to reach something above you, use a strong step stool that has a grab bar. Keep electrical cords out of the way. Do not use floor polish or wax that makes floors slippery. If you must use wax, use non-skid floor wax. Do not have throw rugs and other things on the floor that  can make you trip. What can I do with my stairs? Do not leave any items on the stairs. Make sure that there are handrails on both sides of the stairs and use them. Fix handrails that are broken or loose. Make sure that handrails are as long as the stairways. Check any carpeting to make sure that it is firmly attached to the stairs. Fix any carpet that is loose or worn. Avoid having throw rugs at the top or bottom of the stairs. If you do have throw rugs, attach them to the floor with carpet tape. Make sure that you have a light switch at the top of the stairs  and the bottom of the stairs. If you do not have them, ask someone to add them for you. What else can I do to help prevent falls? Wear shoes that: Do not have high heels. Have rubber bottoms. Are comfortable and fit you well. Are closed at the toe. Do not wear sandals. If you use a stepladder: Make sure that it is fully opened. Do not climb a closed stepladder. Make sure that both sides of the stepladder are locked into place. Ask someone to hold it for you, if possible. Clearly mark and make sure that you can see: Any grab bars or handrails. First and last steps. Where the edge of each step is. Use tools that help you move around (mobility aids) if they are needed. These include: Canes. Walkers. Scooters. Crutches. Turn on the lights when you go into a dark area. Replace any light bulbs as soon as they burn out. Set up your furniture so you have a clear path. Avoid moving your furniture around. If any of your floors are uneven, fix them. If there are any pets around you, be aware of where they are. Review your medicines with your doctor. Some medicines can make you feel dizzy. This can increase your chance of falling. Ask your doctor what other things that you can do to help prevent falls. This information is not intended to replace advice given to you by your health care provider. Make sure you discuss any questions you have with your health care provider. Document Released: 11/07/2008 Document Revised: 06/19/2015 Document Reviewed: 02/15/2014 Elsevier Interactive Patient Education  2017 [employer].

## 2020-09-18 ENCOUNTER — Ambulatory Visit (INDEPENDENT_AMBULATORY_CARE_PROVIDER_SITE_OTHER): Payer: Medicare Other | Admitting: Internal Medicine

## 2020-09-18 ENCOUNTER — Other Ambulatory Visit: Payer: Self-pay

## 2020-09-18 ENCOUNTER — Encounter: Payer: Self-pay | Admitting: Internal Medicine

## 2020-09-18 VITALS — BP 122/78 | HR 83 | Temp 98.0°F | Ht 62.0 in | Wt 125.8 lb

## 2020-09-18 DIAGNOSIS — Z0000 Encounter for general adult medical examination without abnormal findings: Secondary | ICD-10-CM | POA: Diagnosis not present

## 2020-09-18 DIAGNOSIS — I480 Paroxysmal atrial fibrillation: Secondary | ICD-10-CM | POA: Diagnosis not present

## 2020-09-18 DIAGNOSIS — R21 Rash and other nonspecific skin eruption: Secondary | ICD-10-CM

## 2020-09-18 DIAGNOSIS — M810 Age-related osteoporosis without current pathological fracture: Secondary | ICD-10-CM

## 2020-09-18 DIAGNOSIS — E785 Hyperlipidemia, unspecified: Secondary | ICD-10-CM

## 2020-09-18 MED ORDER — ZOLPIDEM TARTRATE 10 MG PO TABS: ORAL_TABLET | ORAL | 1 refills | Status: DC

## 2020-09-18 NOTE — Assessment & Plan Note (Signed)
On Prolia q 6 months

## 2020-09-18 NOTE — Progress Notes (Signed)
Subjective:  Patient ID: Selena Taylor, female    DOB: [birth year]  Age: 85 y.o. MRN: [id number]  CC: Annual Exam   HPI NALANIE WINIECKI presents for a well exam F/u on facial rash - much better  Outpatient Medications Prior to Visit  Medication Sig Dispense Refill   Cholecalciferol (VITAMIN D3) 2000 units capsule Take 1 capsule (2,000 Units total) by mouth daily. 100 capsule 3   denosumab (PROLIA) 60 MG/ML SOSY injection Inject 60 mg into the skin every 6 (six) months.     diltiazem (CARDIZEM CD) 240 MG 24 hr capsule TAKE 1 CAPSULE(240 MG) BY MOUTH DAILY 90 capsule 2   hydrocortisone 2.5 % ointment Apply topically 2 (two) times daily.     nebivolol (BYSTOLIC) 5 MG tablet Take 0.5 tablets (2.5 mg total) by mouth daily. 15 tablet 8   ramipril (ALTACE) 10 MG capsule TAKE 1 CAPSULE(10 MG) BY MOUTH DAILY 30 capsule 11   Rivaroxaban (XARELTO) 15 MG TABS tablet TAKE 1 TABLET(15 MG) BY MOUTH DAILY WITH SUPPER 90 tablet 1   triamcinolone ointment (KENALOG) 0.1 % Apply 1 application topically 2 (two) times daily. 80 g 2   zolpidem (AMBIEN) 10 MG tablet TAKE 1 TABLET(10 MG) BY MOUTH AT BEDTIME AS NEEDED FOR SLEEP 60 tablet 2   furosemide (LASIX) 40 MG tablet Take 1 tablet (40 mg total) by mouth as needed. (Patient not taking: Reported on 09/18/2020) 30 tablet 0   No facility-administered medications prior to visit.    ROS: Review of Systems  Constitutional:  Negative for activity change, appetite change, chills, fatigue and unexpected weight change.  HENT:  Negative for congestion, mouth sores and sinus pressure.   Eyes:  Negative for visual disturbance.  Respiratory:  Negative for cough and chest tightness.   Gastrointestinal:  Negative for abdominal pain and nausea.  Genitourinary:  Negative for difficulty urinating, frequency and vaginal pain.  Musculoskeletal:  Negative for back pain and gait problem.  Skin:  Negative for pallor and rash.  Neurological:  Negative for dizziness, tremors,  weakness, numbness and headaches.  Psychiatric/Behavioral:  Negative for confusion and sleep disturbance. The patient is not nervous/anxious.    Objective:  BP 122/78 (BP Location: Left Arm)   Pulse 83   Temp 98 F (36.7 C) (Oral)   Ht 5\' 2"  (1.575 m)   Wt 125 lb 12.8 oz (57.1 kg)   SpO2 94%   BMI 23.01 kg/m   BP Readings from Last 3 Encounters:  09/18/20 122/78  09/03/20 138/90  08/13/20 102/60    Wt Readings from Last 3 Encounters:  09/18/20 125 lb 12.8 oz (57.1 kg)  09/03/20 127 lb 9.6 oz (57.9 kg)  08/13/20 125 lb 9.6 oz (57 kg)    Physical Exam Constitutional:      General: She is not in acute distress.    Appearance: She is well-developed.  HENT:     Head: Normocephalic.     Right Ear: External ear normal.     Left Ear: External ear normal.     Nose: Nose normal.  Eyes:     General:        Right eye: No discharge.        Left eye: No discharge.     Conjunctiva/sclera: Conjunctivae normal.     Pupils: Pupils are equal, round, and reactive to light.  Neck:     Thyroid: No thyromegaly.     Vascular: No JVD.     Trachea: No tracheal  deviation.  Cardiovascular:     Rate and Rhythm: Normal rate. Rhythm irregular.     Heart sounds: Normal heart sounds.  Pulmonary:     Effort: No respiratory distress.     Breath sounds: No stridor. No wheezing.  Abdominal:     General: Bowel sounds are normal. There is no distension.     Palpations: Abdomen is soft. There is no mass.     Tenderness: There is no abdominal tenderness. There is no guarding or rebound.  Musculoskeletal:        General: No tenderness.     Cervical back: Normal range of motion and neck supple. No rigidity.  Lymphadenopathy:     Cervical: No cervical adenopathy.  Skin:    Findings: No erythema or rash.  Neurological:     Cranial Nerves: No cranial nerve deficit.     Motor: No abnormal muscle tone.     Coordination: Coordination normal.     Deep Tendon Reflexes: Reflexes normal.  Psychiatric:         Behavior: Behavior normal.        Thought Content: Thought content normal.        Judgment: Judgment normal.    Lab Results  Component Value Date   WBC 5.9 08/21/2020   HGB 12.9 08/21/2020   HCT 38.0 08/21/2020   PLT 201 08/21/2020   GLUCOSE 86 09/03/2020   CHOL 163 10/15/2014   TRIG 93.0 10/15/2014   HDL 79.80 10/15/2014   LDLCALC 65 10/15/2014   ALT 21 09/03/2020   AST 31 09/03/2020   NA 141 09/03/2020   K 4.2 09/03/2020   CL 106 09/03/2020   CREATININE 0.99 09/03/2020   BUN 21 09/03/2020   CO2 25 09/03/2020   TSH 1.23 09/03/2020   INR 2.03 (H) 02/29/2012   HGBA1C 6.5 01/28/2014    MR Lumbar Spine Wo Contrast  Result Date: 05/28/2019 CLINICAL DATA:  Chronic low back pain. EXAM: MRI LUMBAR SPINE WITHOUT CONTRAST TECHNIQUE: Multiplanar, multisequence MR imaging of the lumbar spine was performed. No intravenous contrast was administered. COMPARISON:  Lumbar radiographs 02/17/2018 FINDINGS: Segmentation: There are five lumbar type vertebral bodies. The last full intervertebral disc space is labeled L5-S1. This correlates with the radiographs. Alignment: Normal overall alignment. Very slight degenerative anterolisthesis of L3 and L4. Vertebrae: Endplate reactive changes but no worrisome bone lesions or fractures. Conus medullaris and cauda equina: Conus extends to the L2 level. Conus and cauda equina appear normal. Paraspinal and other soft tissues: No significant findings. Disc levels: T12-L1: No significant findings. L1-2: Mild facet disease but no disc protrusions, spinal or foraminal stenosis. L2-3: Moderate degenerative disc disease with a bulging degenerated annulus and osteophytic ridging. There is also moderate facet disease and the combination contributes to mild to moderate bilateral lateral recess stenosis. Mild foraminal encroachment bilaterally also. L3-4: Advanced degenerative disc disease and mild facet disease. Bulging annulus with flattening of the ventral thecal  sac. There is also mild foraminal encroachment bilaterally. L4-5: Bulging and slightly uncovered disc with slight flattening of the ventral thecal sac. There is also moderate facet disease and slight thickening and buckling of the ligamentum flavum third no significant spinal stenosis. Early spinal and mild bilateral lateral recess stenosis. No foraminal stenosis. L5-S1: Moderate to advanced facet disease and slight diffuse bulging annulus but no significant spinal or foraminal stenosis. IMPRESSION: 1. Degenerative lumbar spondylosis with multilevel disc disease and facet disease. 2. Mild to moderate bilateral lateral recess stenosis and mild bilateral foraminal encroachment  at L2-3. 3. Mild bilateral foraminal encroachment at L3-4. 4. Early spinal and mild bilateral lateral recess stenosis at L4-5. Electronically Signed   By: Rudie Meyer M.D.   On: 05/28/2019 14:57    Assessment & Plan:     Sonda Primes, MD

## 2020-09-18 NOTE — Assessment & Plan Note (Signed)
Much better 

## 2020-09-18 NOTE — Assessment & Plan Note (Signed)
   We discussed age appropriate health related issues, including available/recomended screening tests and vaccinations. Labs were ordered to be later reviewed . All questions were answered. We discussed one or more of the following - seat belt use, use of sunscreen/sun exposure exercise, fall risk reduction, second hand smoke exposure, firearm use and storage, seat belt use, a need for adhering to healthy diet and exercise. Labs were ordered.  All questions were answered. Get a tDAP 2022 Shinggrix Eye exam, mammo

## 2020-09-18 NOTE — Assessment & Plan Note (Signed)
Cont on Xarelto, Bystolic, [place]

## 2020-09-20 ENCOUNTER — Other Ambulatory Visit: Payer: Self-pay | Admitting: Internal Medicine

## 2020-10-24 ENCOUNTER — Ambulatory Visit: Payer: Medicare Other | Admitting: Internal Medicine

## 2020-10-27 ENCOUNTER — Telehealth: Payer: Self-pay | Admitting: Internal Medicine

## 2020-10-27 NOTE — Telephone Encounter (Signed)
Begin Bystolic 5mg  - 1/2 tablet by mouth daily per 08/13/20 OV note...  08/15/20 with [facility]e [address]/ [place]... advised the dose prescribed for the pt.

## 2020-10-27 NOTE — Telephone Encounter (Signed)
Pt c/o medication issue:  1. Name of Medication:  nebivolol (BYSTOLIC) 5 MG tablet  2. How are you currently taking this medication (dosage and times per day)?   3. Are you having a reaction (difficulty breathing--STAT)?   4. What is your medication issue?   Christiane Ha with [facility] is requesting to clarify dosage/instructions.

## 2020-10-29 ENCOUNTER — Telehealth: Payer: Self-pay | Admitting: Internal Medicine

## 2020-10-29 NOTE — Telephone Encounter (Signed)
New Message:     Patient have discovered she having been taking her Nebivolol wrong.- She have been taking  5 mg instead of 2,.5 mg a day. She is now out of it. She wants to know if Dr Graciela Husbands wants her to continue taking the 5 mg of Nebivolol? She says her blood pressure have pretty good taking the 5 mg. Whatever milligram Dr Graciela Husbands decides for her to take, she will need it called in to her pharmacy please.

## 2020-10-30 ENCOUNTER — Ambulatory Visit: Payer: Medicare Other | Admitting: Internal Medicine

## 2020-10-30 NOTE — Telephone Encounter (Signed)
Spoke with pt and advised pt to continue medication as ordered Nebivolol 5mg  - (1/2 tablet) 2.5mg  by mouth daily.  Pt advised medication was changes at last appointment with Dr d/t her complaints of fatigue.  Pt states she never reduced the medication to 1/2 tablet but she will try that as she continues with fatigue.  She will continue to monitor heart rate and blood pressure and will contact RN next week if needed.  Pt advised to check with pharmacy as she should have refills left on her current Rx.  Pt verbalizes understanding and agrees with current plan.

## 2020-10-30 NOTE — Telephone Encounter (Signed)
Attempted phone call to pt.  Left voicemail message to contact RN at [phone].  Nebivolol 5 mg Rx was sent to pharmacy on 09/12/2020 with 8 RF.  Pt will need to continue medication as ordered Nebivolol 5mg  - (1/2 tablet) 2.5mg  by mouth daily.

## 2020-11-10 ENCOUNTER — Ambulatory Visit (INDEPENDENT_AMBULATORY_CARE_PROVIDER_SITE_OTHER): Payer: Medicare Other

## 2020-11-10 ENCOUNTER — Other Ambulatory Visit: Payer: Self-pay

## 2020-11-10 DIAGNOSIS — Z23 Encounter for immunization: Secondary | ICD-10-CM

## 2020-11-24 DIAGNOSIS — H02102 Unspecified ectropion of right lower eyelid: Secondary | ICD-10-CM | POA: Diagnosis not present

## 2020-11-24 DIAGNOSIS — H26492 Other secondary cataract, left eye: Secondary | ICD-10-CM | POA: Diagnosis not present

## 2020-11-24 DIAGNOSIS — H02105 Unspecified ectropion of left lower eyelid: Secondary | ICD-10-CM | POA: Diagnosis not present

## 2020-11-24 DIAGNOSIS — H18513 Endothelial corneal dystrophy, bilateral: Secondary | ICD-10-CM | POA: Diagnosis not present

## 2020-11-29 ENCOUNTER — Telehealth: Payer: Self-pay

## 2020-11-29 NOTE — Telephone Encounter (Signed)
Prior Auth required for Prolia  PA PROCESS DETAILS: PA is required. Providers may call Medical Utilization at [phone] to initiate. Forms may be accessed online at [email or web address] 

## 2020-12-01 NOTE — Telephone Encounter (Signed)
PA initiated via [email or web address]     

## 2020-12-02 NOTE — Telephone Encounter (Signed)
Tiffany w/ [employer] calling in w/ approval for Prolia  Can call [phone] opt 5 if needed

## 2020-12-04 NOTE — Telephone Encounter (Signed)
Pt ready for scheduling on or after 11/30/20  Out-of-pocket cost due at time of visit: $295  Primary: BCBS Prolia co-insurance: 20% (approximately $270) Admin fee co-insurance: 20% (approximately $25)  Secondary: n/a Prolia co-insurance:  Admin fee co-insurance:   Deductible: does not apply  Prior Auth: APPROVED PA#: CoverMyMeds KEY: [id number] Valid: 12/01/20-12/01/21  ** This summary of benefits is an estimation of the patient's out-of-pocket cost. Exact cost may vary based on individual plan coverage.

## 2020-12-05 DIAGNOSIS — L821 Other seborrheic keratosis: Secondary | ICD-10-CM | POA: Diagnosis not present

## 2020-12-05 DIAGNOSIS — Z85828 Personal history of other malignant neoplasm of skin: Secondary | ICD-10-CM | POA: Diagnosis not present

## 2020-12-05 DIAGNOSIS — L570 Actinic keratosis: Secondary | ICD-10-CM | POA: Diagnosis not present

## 2020-12-05 DIAGNOSIS — L820 Inflamed seborrheic keratosis: Secondary | ICD-10-CM | POA: Diagnosis not present

## 2020-12-22 ENCOUNTER — Other Ambulatory Visit: Payer: Self-pay

## 2020-12-22 ENCOUNTER — Ambulatory Visit (INDEPENDENT_AMBULATORY_CARE_PROVIDER_SITE_OTHER): Payer: Medicare Other

## 2020-12-22 DIAGNOSIS — M810 Age-related osteoporosis without current pathological fracture: Secondary | ICD-10-CM

## 2020-12-22 MED ORDER — DENOSUMAB 60 MG/ML ~~LOC~~ SOSY
60.0000 mg | PREFILLED_SYRINGE | Freq: Once | SUBCUTANEOUS | Status: AC
  Administered 2020-12-22: 11:00:00 60 mg via SUBCUTANEOUS

## 2020-12-22 NOTE — Progress Notes (Signed)
Pt was given Prolia w/o any complications. ?

## 2020-12-27 NOTE — Telephone Encounter (Signed)
Last Prolia inj 12/22/20 Next Prolia inj due 06/22/21

## 2021-01-21 DIAGNOSIS — R001 Bradycardia, unspecified: Secondary | ICD-10-CM | POA: Insufficient documentation

## 2021-01-23 ENCOUNTER — Ambulatory Visit: Payer: Medicare Other | Admitting: Internal Medicine

## 2021-01-23 ENCOUNTER — Other Ambulatory Visit: Payer: Self-pay

## 2021-01-23 ENCOUNTER — Encounter: Payer: Self-pay | Admitting: Internal Medicine

## 2021-01-23 VITALS — BP 114/62 | HR 86 | Ht 62.0 in | Wt 126.6 lb

## 2021-01-23 DIAGNOSIS — I480 Paroxysmal atrial fibrillation: Secondary | ICD-10-CM

## 2021-01-23 DIAGNOSIS — R001 Bradycardia, unspecified: Secondary | ICD-10-CM

## 2021-01-23 NOTE — Patient Instructions (Signed)

## 2021-01-23 NOTE — Progress Notes (Signed)
Patient ID: Selena Taylor, female   DOB: 10-16-1935, 85 y.o.   MRN: [id number]       Patient Care Team: Cassandria Anger, MD as PCP - General (Internal Medicine) Deboraha Sprang, MD as PCP - Cardiology (Cardiology) Deboraha Sprang, MD as PCP - Electrophysiology (Cardiology) Deboraha Sprang, MD as Consulting Physician (Cardiology) Rolm Bookbinder, MD as Consulting Physician (Dermatology) Shon Hough, MD as Consulting Physician (Ophthalmology) Marygrace Drought, MD as Consulting Physician (Ophthalmology)   HPI  Selena Taylor is a 85 y.o. female Seen in followup for atrial fibrillation and pericarditis. Because of persistent symptoms she had undergone a pericardial window.  She takes Rivaroxaban    Her husband died May 18, 2014. Her son has moved from [place].  He is scheduled to move out in May  On Anticoagulation;  no  bleeding     Today, she reports feeling very fatigued, including when she first wakes up. Occasionally she falls asleep easily when sitting in her chair. Typiically she falls asleep at midnight, and wakes up at 5 AM. Sometimes she has been told by a family member that she snores.  No new medications in the last years.  She has not noticed any change since decreasing bystolic. She stopped taking tramadol 6 months ago.  She stopped checking her blood pressure shortly after her previous visit. At that time her readings would fluctuate often, but she denies any associated lightheadedness/dizziness.  Of note, she endorses a chest cold that has been persistent for 5 weeks.  The patient denies chest pain, shortness of breath, nocturnal dyspnea, orthopnea or peripheral edema.  There have been no palpitations, or syncope.   Patient denies symptoms of GI intolerance, sun sensitivity, neurological symptoms attributable to amiodarone.    Date Cr K Hgb  6/17 0.89 4.6 13.9  5/18 1.02 4.6 13.8   7/20  1.03 4.9   7/21 0.93 4.5 13.4  3/22 1.1 4.7    8/22 0.99 4.2 12.9 (7/22)    DATE TEST EF%   1/13 Echo    1/14 Echo    7/15 Echo 50-55% LAE   4/22 Echo 55-60%     Past Medical History:  Diagnosis Date   Anxiety    CHF (congestive heart failure) (HCC)    Chronic anticoagulation    on coumadin   Endometriosis    HTN (hypertension)    Osteopenia    PAF (paroxysmal atrial fibrillation) ([place])    has been on Flecainide in the past. Stopped 02/10/11; Remains on coumadin anticoagulation; intol to Rythmol and failed DCCV; noted to be in AFlutter 5/13;  Echo 01/2011:  EF 55-60%, mild MR, mild LAE.  Myoview 6/12:  No ischemia, EF 74%   Pericardial effusion    Pericardial effusion 02/23/2012   Shortness of breath    TIA (transient ischemic attack)     Past Surgical History:  Procedure Laterality Date   APPENDECTOMY     CARDIOVERSION  01/14/2011   Procedure: CARDIOVERSION;  Surgeon: Deboraha Sprang, MD;  Location: [facility];  Service: Cardiovascular;  Laterality: N/A;  To be completed in Neuro OR 33 time slot 0830 12/20   CARDIOVERSION  05/27/2011   Procedure: CARDIOVERSION;  Surgeon: Deboraha Sprang, MD;  Location: [place];  Service: Cardiovascular;  Laterality: N/A;   CATARACT EXTRACTION Bilateral    PERICARDIAL WINDOW  02/25/2012   Procedure: PERICARDIAL WINDOW;  Surgeon: Grace Isaac, MD;  Location: [place];  Service: Thoracic;  Laterality: N/A;   POLYPECTOMY  skin cancer removal     TEE WITHOUT CARDIOVERSION  02/25/2012   Procedure: TRANSESOPHAGEAL ECHOCARDIOGRAM (TEE);  Surgeon: Grace Isaac, MD;  Location: [facility] OR;  Service: Thoracic;  Laterality: N/A;   TOTAL HYSTERECTOMY AND BILATERAL SALPINGOOPHERECTOMY      Current Outpatient Medications  Medication Sig Dispense Refill   Cholecalciferol (VITAMIN D3) 2000 units capsule Take 1 capsule (2,000 Units total) by mouth daily. 100 capsule 3   denosumab (PROLIA) 60 MG/ML SOSY injection Inject 60 mg into the skin every 6 (six) months.     diltiazem (CARDIZEM CD) 240 MG 24 hr capsule TAKE 1 CAPSULE(240 MG) BY  MOUTH DAILY 90 capsule 2   hydrocortisone 2.5 % ointment Apply topically 2 (two) times daily.     nebivolol (BYSTOLIC) 5 MG tablet Take 0.5 tablets (2.5 mg total) by mouth daily. 15 tablet 8   ramipril (ALTACE) 10 MG capsule TAKE 1 CAPSULE(10 MG) BY MOUTH DAILY 30 capsule 11   Rivaroxaban (XARELTO) 15 MG TABS tablet TAKE 1 TABLET(15 MG) BY MOUTH DAILY WITH SUPPER 90 tablet 1   triamcinolone ointment (KENALOG) 0.1 % Apply 1 application topically 2 (two) times daily. 80 g 2   zolpidem (AMBIEN) 10 MG tablet TAKE 1 TABLET(10 MG) BY MOUTH AT BEDTIME AS NEEDED FOR SLEEP 90 tablet 1   No current facility-administered medications for this visit.    Allergies  Allergen Reactions   Other Other (See Comments)    Novocaine.  REACTION:  Rapid heart rate   Procaine Hcl     Rapid heart rate. The pt can tolerate Lidocaine OK.    Review of Systems negative except from HPI and PMH  Physical Exam: BP 114/62    Pulse 86    Ht 5\' 2"  (1.575 m)    Wt 126 lb 9.6 oz (57.4 kg)    SpO2 97%    BMI 23.16 kg/m  Well developed and well nourished in no acute distress HENT normal Neck supple with JVP-flat Clear Device pocket well healed; without hematoma or erythema.  There is no tethering  Irregularly Irregular rate and rhythm with controlled  ventricular response , no  gallop No  murmur Abd-soft with active BS No Clubbing cyanosis  edema Skin-warm and dry A & Oriented  Grossly normal sensory and motor function  ECG atrial fibrillation at 86  -/07/36 Nonspecific   T wave changes     Assessment and  Plan  Atrial fibrillation with a Controlled ventricular response   Renal insufficiency   Bradycardia question chronotropic incompetence  Hypertension  Fatigue  On anticoagulation with Xarelto at 15 mg dose for her renal function.  Continue  Blood pressures well controlled without lightheadedness.  Somewhat variable at home.  We will continue the diltiazem [id number], Bystolic 2.5 Altace 10  Fatigue may  well be sleep apnea although I wonder why would you show up an 85 year old.  She does snore and have significant daytime somnolence.  However, with her son now at home she is sleeping only about 5 hours a night as she waits for him to come home.  Suggested that she go to bed earlier and see how it works.I, Virl Axe, MD, have reviewed all documentation for this visit. The documentation on 01/23/21 for the exam, diagnosis, procedures, and orders are all accurate and complete.   I,Mathew Stumpf,acting as a scribe for Virl Axe, MD.,have documented all relevant documentation on the behalf of Virl Axe, MD,as directed by  Virl Axe, MD while in the presence  of Sherryl Manges, MD.   I, Sherryl Manges, MD, have reviewed all documentation for this visit. The documentation on 01/23/21 for the exam, diagnosis, procedures, and orders are all accurate and complete.

## 2021-02-19 ENCOUNTER — Other Ambulatory Visit: Payer: Self-pay

## 2021-02-19 MED ORDER — RIVAROXABAN 15 MG PO TABS: ORAL_TABLET | ORAL | 1 refills | Status: DC

## 2021-02-19 NOTE — Telephone Encounter (Signed)
Prescription refill request for Xarelto received.  Indication: Afib  Last office visit:01/23/21 Selena Taylor)  Weight: 57.4kg Age: 86 Scr: 1.16 (04/21/20)  CrCl: 33.30ml/min  Appropriate dose and refill sent to requested pharmacy.

## 2021-02-24 DIAGNOSIS — Z85828 Personal history of other malignant neoplasm of skin: Secondary | ICD-10-CM | POA: Diagnosis not present

## 2021-02-24 DIAGNOSIS — D485 Neoplasm of uncertain behavior of skin: Secondary | ICD-10-CM | POA: Diagnosis not present

## 2021-02-24 DIAGNOSIS — C44729 Squamous cell carcinoma of skin of left lower limb, including hip: Secondary | ICD-10-CM | POA: Diagnosis not present

## 2021-03-26 ENCOUNTER — Telehealth: Payer: Self-pay

## 2021-03-26 ENCOUNTER — Other Ambulatory Visit: Payer: Self-pay | Admitting: Internal Medicine

## 2021-03-26 NOTE — Telephone Encounter (Signed)
Pt is calling requesting a 90 day supply refill on: ?zolpidem (AMBIEN) 10 MG tablet. ? ?Pharmacy: ?[id number] [place], [place] - [address] AT [facility] [place] [address] ? ?LOV 09/18/20 ?ROV 04/09/21 ? ?Pt CB [phone] ? ?

## 2021-03-27 NOTE — Telephone Encounter (Signed)
Rx was sent to doctor for approval ?

## 2021-03-27 NOTE — Telephone Encounter (Signed)
LOV 09/18/20 ?Next scheduled OV 04/09/21 ? ? ?

## 2021-04-09 ENCOUNTER — Ambulatory Visit: Payer: Medicare Other | Admitting: Internal Medicine

## 2021-04-23 ENCOUNTER — Ambulatory Visit: Payer: Medicare Other | Admitting: Internal Medicine

## 2021-04-27 NOTE — Telephone Encounter (Signed)
Prolia VOB initiated via [email or web address] ? ?Last OV: 09/18/20 ?Next OV:  ?Last Prolia inj: 12/22/21 ?Next Prolia inj DUE: 06/22/21 ? ?

## 2021-04-30 ENCOUNTER — Encounter: Payer: Self-pay | Admitting: Internal Medicine

## 2021-04-30 ENCOUNTER — Ambulatory Visit (INDEPENDENT_AMBULATORY_CARE_PROVIDER_SITE_OTHER): Payer: Medicare Other | Admitting: Internal Medicine

## 2021-04-30 DIAGNOSIS — I480 Paroxysmal atrial fibrillation: Secondary | ICD-10-CM

## 2021-04-30 DIAGNOSIS — R21 Rash and other nonspecific skin eruption: Secondary | ICD-10-CM

## 2021-04-30 DIAGNOSIS — I10 Essential (primary) hypertension: Secondary | ICD-10-CM | POA: Diagnosis not present

## 2021-04-30 DIAGNOSIS — M545 Low back pain, unspecified: Secondary | ICD-10-CM

## 2021-04-30 DIAGNOSIS — G8929 Other chronic pain: Secondary | ICD-10-CM

## 2021-04-30 DIAGNOSIS — R251 Tremor, unspecified: Secondary | ICD-10-CM

## 2021-04-30 MED ORDER — HYDROCORTISONE 2.5 % EX OINT: TOPICAL_OINTMENT | Freq: Three times a day (TID) | CUTANEOUS | 2 refills | Status: DC

## 2021-04-30 NOTE — Assessment & Plan Note (Signed)
On  Bystolic, Cartia, Altace ?

## 2021-04-30 NOTE — Assessment & Plan Note (Addendum)
Pt stopped Tramadol. ? Potential benefits of a short/long term opioids use as well as potential risks (i.e. addiction risk, apnea etc) and complications (i.e. Somnolence, constipation and others) were explained to the patient and were aknowledged. ?On "Relief Factor" - helping ?Blue-Emu cream was recommended to use 2-3 times a day ? ?

## 2021-04-30 NOTE — Progress Notes (Signed)
? ?Subjective:  ?Patient ID: Selena Taylor, female    DOB: Dec 28, 1935  Age: 86 y.o. MRN: [id number] ? ?CC: No chief complaint on file. ? ? ?HPI ?Selena SaltJoyce H Taylor presents for rash on face - L cheek (pt saw Dr Nicholas LoseLomax) - HCT cream is helping ? ?F/u on LBP/OA ? ?F/u on tremor ? ?Pt stopped Tramadol. ? ?On "Relief Factor" - helping ? ? ? ?Outpatient Medications Prior to Visit  ?Medication Sig Dispense Refill  ? Cholecalciferol (VITAMIN D3) 2000 units capsule Take 1 capsule (2,000 Units total) by mouth daily. 100 capsule 3  ? denosumab (PROLIA) 60 MG/ML SOSY injection Inject 60 mg into the skin every 6 (six) months.    ? diltiazem (CARDIZEM CD) 240 MG 24 hr capsule TAKE 1 CAPSULE(240 MG) BY MOUTH DAILY 90 capsule 2  ? nebivolol (BYSTOLIC) 5 MG tablet Take 0.5 tablets (2.5 mg total) by mouth daily. 15 tablet 8  ? ramipril (ALTACE) 10 MG capsule TAKE 1 CAPSULE(10 MG) BY MOUTH DAILY 30 capsule 11  ? Rivaroxaban (XARELTO) 15 MG TABS tablet TAKE 1 TABLET(15 MG) BY MOUTH DAILY WITH SUPPER 90 tablet 1  ? zolpidem (AMBIEN) 10 MG tablet TAKE 1 TABLET BY MOUTH AT BEDTIME AS NEEDED FOR SLEEP 30 tablet 5  ? hydrocortisone 2.5 % ointment Apply topically 2 (two) times daily.    ? triamcinolone ointment (KENALOG) 0.1 % Apply 1 application topically 2 (two) times daily. 80 g 2  ? ?No facility-administered medications prior to visit.  ? ? ?ROS: ?Review of Systems  ?Constitutional:  Negative for activity change, appetite change, chills, fatigue and unexpected weight change.  ?HENT:  Negative for congestion, mouth sores and sinus pressure.   ?Eyes:  Negative for visual disturbance.  ?Respiratory:  Negative for cough and chest tightness.   ?Gastrointestinal:  Negative for abdominal pain and nausea.  ?Genitourinary:  Negative for difficulty urinating, frequency and vaginal pain.  ?Musculoskeletal:  Positive for arthralgias and back pain. Negative for gait problem.  ?Skin:  Positive for rash. Negative for pallor.  ?Neurological:  Negative for  dizziness, tremors, weakness, numbness and headaches.  ?Psychiatric/Behavioral:  Negative for confusion and sleep disturbance.   ? ?Objective:  ?BP 120/84 (BP Location: Left Arm, Patient Position: Sitting, Cuff Size: Large)   Pulse 64   Temp 98 ?F (36.7 ?C) (Oral)   Ht 5\' 2"  (1.575 m)   Wt 128 lb (58.1 kg)   SpO2 99%   BMI 23.41 kg/m?  ? ?BP Readings from Last 3 Encounters:  ?04/30/21 120/84  ?01/23/21 114/62  ?09/18/20 122/78  ? ? ?Wt Readings from Last 3 Encounters:  ?04/30/21 128 lb (58.1 kg)  ?01/23/21 126 lb 9.6 oz (57.4 kg)  ?09/18/20 125 lb 12.8 oz (57.1 kg)  ? ? ?Physical Exam ?Constitutional:   ?   General: She is not in acute distress. ?   Appearance: She is well-developed.  ?HENT:  ?   Head: Normocephalic.  ?   Right Ear: External ear normal.  ?   Left Ear: External ear normal.  ?   Nose: Nose normal.  ?Eyes:  ?   General:     ?   Right eye: No discharge.     ?   Left eye: No discharge.  ?   Conjunctiva/sclera: Conjunctivae normal.  ?   Pupils: Pupils are equal, round, and reactive to light.  ?Neck:  ?   Thyroid: No thyromegaly.  ?   Vascular: No JVD.  ?   Trachea:  No tracheal deviation.  ?Cardiovascular:  ?   Rate and Rhythm: Normal rate. Rhythm irregular.  ?   Heart sounds: Normal heart sounds.  ?Pulmonary:  ?   Effort: No respiratory distress.  ?   Breath sounds: No stridor. No wheezing.  ?Abdominal:  ?   General: Bowel sounds are normal. There is no distension.  ?   Palpations: Abdomen is soft. There is no mass.  ?   Tenderness: There is no abdominal tenderness. There is no guarding or rebound.  ?Musculoskeletal:     ?   General: No tenderness.  ?   Cervical back: Normal range of motion and neck supple. No rigidity.  ?Lymphadenopathy:  ?   Cervical: No cervical adenopathy.  ?Skin: ?   Findings: No erythema or rash.  ?Neurological:  ?   Cranial Nerves: No cranial nerve deficit.  ?   Motor: No abnormal muscle tone.  ?   Coordination: Coordination normal.  ?   Deep Tendon Reflexes: Reflexes normal.   ?Psychiatric:     ?   Behavior: Behavior normal.     ?   Thought Content: Thought content normal.     ?   Judgment: Judgment normal.  ? ? ?Lab Results  ?Component Value Date  ? WBC 5.9 08/21/2020  ? HGB 12.9 08/21/2020  ? HCT 38.0 08/21/2020  ? PLT 201 08/21/2020  ? GLUCOSE 86 09/03/2020  ? CHOL 163 10/15/2014  ? TRIG 93.0 10/15/2014  ? HDL 79.80 10/15/2014  ? LDLCALC 65 10/15/2014  ? ALT 21 09/03/2020  ? AST 31 09/03/2020  ? NA 141 09/03/2020  ? K 4.2 09/03/2020  ? CL 106 09/03/2020  ? CREATININE 0.99 09/03/2020  ? BUN 21 09/03/2020  ? CO2 25 09/03/2020  ? TSH 1.23 09/03/2020  ? INR 2.03 (H) 02/29/2012  ? HGBA1C 6.5 01/28/2014  ? ? ?MR Lumbar Spine Wo Contrast ? ?Result Date: 05/28/2019 ?CLINICAL DATA:  Chronic low back pain. EXAM: MRI LUMBAR SPINE WITHOUT CONTRAST TECHNIQUE: Multiplanar, multisequence MR imaging of the lumbar spine was performed. No intravenous contrast was administered. COMPARISON:  Lumbar radiographs 02/17/2018 FINDINGS: Segmentation: There are five lumbar type vertebral bodies. The last full intervertebral disc space is labeled L5-S1. This correlates with the radiographs. Alignment: Normal overall alignment. Very slight degenerative anterolisthesis of L3 and L4. Vertebrae: Endplate reactive changes but no worrisome bone lesions or fractures. Conus medullaris and cauda equina: Conus extends to the L2 level. Conus and cauda equina appear normal. Paraspinal and other soft tissues: No significant findings. Disc levels: T12-L1: No significant findings. L1-2: Mild facet disease but no disc protrusions, spinal or foraminal stenosis. L2-3: Moderate degenerative disc disease with a bulging degenerated annulus and osteophytic ridging. There is also moderate facet disease and the combination contributes to mild to moderate bilateral lateral recess stenosis. Mild foraminal encroachment bilaterally also. L3-4: Advanced degenerative disc disease and mild facet disease. Bulging annulus with flattening of the  ventral thecal sac. There is also mild foraminal encroachment bilaterally. L4-5: Bulging and slightly uncovered disc with slight flattening of the ventral thecal sac. There is also moderate facet disease and slight thickening and buckling of the ligamentum flavum third no significant spinal stenosis. Early spinal and mild bilateral lateral recess stenosis. No foraminal stenosis. L5-S1: Moderate to advanced facet disease and slight diffuse bulging annulus but no significant spinal or foraminal stenosis. IMPRESSION: 1. Degenerative lumbar spondylosis with multilevel disc disease and facet disease. 2. Mild to moderate bilateral lateral recess stenosis and mild bilateral  foraminal encroachment at L2-3. 3. Mild bilateral foraminal encroachment at L3-4. 4. Early spinal and mild bilateral lateral recess stenosis at L4-5. Electronically Signed   By: Rudie Meyer M.D.   On: 05/28/2019 14:57  ? ? ?Assessment & Plan:  ? ?Problem List Items Addressed This Visit   ? ? Essential hypertension  ?  On  Bystolic, Cartia, Altace ?  ?  ? Atrial fibrillation (HCC)  ?   ?Cont on Xarelto, Bystolic, Cartia ?  ?  ? Tremor  ?  Xanax prn - very rare ? Potential benefits of a long term benzodiazepines  use as well as potential risks  and complications were explained to the patient and were aknowledged. ?  ?  ? Low back pain  ?  Pt stopped Tramadol. ? Potential benefits of a short/long term opioids use as well as potential risks (i.e. addiction risk, apnea etc) and complications (i.e. Somnolence, constipation and others) were explained to the patient and were aknowledged. ?On "Relief Factor" - helping ?Blue-Emu cream was recommended to use 2-3 times a day ? ?  ?  ? Rash and nonspecific skin eruption  ?  Better - Rash L cheek (pt saw Dr Nicholas Lose) - HCT cream is helping ?Skin Bx suggested ?  ?  ?  ? ? ?Meds ordered this encounter  ?Medications  ? hydrocortisone 2.5 % ointment  ?  Sig: Apply topically 3 (three) times daily.  ?  Dispense:  60 g  ?   Refill:  2  ?  ? ? ?Follow-up: Return in about 6 months (around 10/30/2021) for Wellness Exam. ? ?Sonda Primes, MD ?

## 2021-04-30 NOTE — Assessment & Plan Note (Signed)
?  Cont on Xarelto, Bystolic, Cartia ?

## 2021-04-30 NOTE — Assessment & Plan Note (Signed)
Better - Rash L cheek (pt saw Dr Nicholas Lose) - HCT cream is helping ?Skin Bx suggested ?

## 2021-04-30 NOTE — Assessment & Plan Note (Signed)
Xanax prn - very rare ? Potential benefits of a long term benzodiazepines  use as well as potential risks  and complications were explained to the patient and were aknowledged. ?

## 2021-05-01 ENCOUNTER — Other Ambulatory Visit: Payer: Self-pay | Admitting: *Deleted

## 2021-05-01 MED ORDER — DILTIAZEM HCL ER COATED BEADS 240 MG PO CP24: ORAL_CAPSULE | ORAL | 3 refills | Status: DC

## 2021-05-04 ENCOUNTER — Other Ambulatory Visit: Payer: Self-pay | Admitting: *Deleted

## 2021-05-04 MED ORDER — NEBIVOLOL HCL 5 MG PO TABS: 2.5000 mg | ORAL_TABLET | Freq: Every day | ORAL | 8 refills | Status: DC

## 2021-05-16 NOTE — Telephone Encounter (Signed)
Prior auth required for Prolia ? ?PA PROCESS DETAILS: PA is required. Providers may call Medical Utilization at [phone] to initiate. ?Forms may be accessed online at ?[email or web address] & faxed back to [phone] ?

## 2021-05-27 NOTE — Telephone Encounter (Addendum)
Prior auth required for PROLIA  PA PROCESS DETAILS: PA is required. Providers may call Medical Utilization at [phone] to initiate. Forms may be accessed online at [email or web address] & faxed back to [phone] .   

## 2021-06-24 ENCOUNTER — Other Ambulatory Visit: Payer: Self-pay | Admitting: *Deleted

## 2021-06-24 MED ORDER — RIVAROXABAN 15 MG PO TABS: ORAL_TABLET | ORAL | 1 refills | Status: DC

## 2021-06-24 NOTE — Telephone Encounter (Signed)
Prescription refill request for Xarelto received.  Indication: afib  Last office visit:Selena Taylor 01/23/2021 Weight: 58.1 kg  Age: 86 Scr: 0.99. 09/03/2020 CrCl: 39 ml/min   Refill sent.

## 2021-06-25 NOTE — Telephone Encounter (Signed)
Pt ready for scheduling on or after 06/22/21  Out-of-pocket cost due at time of visit: $276  Primary: [employer] [place] [employer] Prolia co-insurance: 20% (approximately $276) Admin fee co-insurance: $0  Secondary: n/a Prolia co-insurance:  Admin fee co-insurance:   Deductible: does not apply  Prior Auth: APPROVED PA#: CoverMyMeds KEY: [id number] Valid: 12/01/20-12/01/21   ** This summary of benefits is an estimation of the patient's out-of-pocket cost. Exact cost may vary based on individual plan coverage.

## 2021-06-26 ENCOUNTER — Emergency Department (HOSPITAL_COMMUNITY)
Admission: EM | Admit: 2021-06-26 | Discharge: 2021-06-26 | Disposition: A | Discharge status: Home / Self Care | Payer: Medicare Other | Attending: Emergency Medicine | Admitting: Emergency Medicine

## 2021-06-26 ENCOUNTER — Other Ambulatory Visit: Payer: Self-pay

## 2021-06-26 ENCOUNTER — Emergency Department (HOSPITAL_COMMUNITY): Payer: Medicare Other

## 2021-06-26 ENCOUNTER — Telehealth: Payer: Self-pay | Admitting: Internal Medicine

## 2021-06-26 ENCOUNTER — Encounter (HOSPITAL_COMMUNITY): Payer: Self-pay

## 2021-06-26 DIAGNOSIS — Z8673 Personal history of transient ischemic attack (TIA), and cerebral infarction without residual deficits: Secondary | ICD-10-CM | POA: Diagnosis not present

## 2021-06-26 DIAGNOSIS — I10 Essential (primary) hypertension: Secondary | ICD-10-CM | POA: Diagnosis not present

## 2021-06-26 DIAGNOSIS — I4891 Unspecified atrial fibrillation: Secondary | ICD-10-CM | POA: Insufficient documentation

## 2021-06-26 DIAGNOSIS — G319 Degenerative disease of nervous system, unspecified: Secondary | ICD-10-CM | POA: Diagnosis not present

## 2021-06-26 DIAGNOSIS — Z7901 Long term (current) use of anticoagulants: Secondary | ICD-10-CM | POA: Diagnosis not present

## 2021-06-26 DIAGNOSIS — H539 Unspecified visual disturbance: Secondary | ICD-10-CM

## 2021-06-26 DIAGNOSIS — G9389 Other specified disorders of brain: Secondary | ICD-10-CM | POA: Diagnosis not present

## 2021-06-26 DIAGNOSIS — H53122 Transient visual loss, left eye: Secondary | ICD-10-CM | POA: Diagnosis not present

## 2021-06-26 DIAGNOSIS — H538 Other visual disturbances: Secondary | ICD-10-CM | POA: Diagnosis not present

## 2021-06-26 LAB — COMPREHENSIVE METABOLIC PANEL
ALT: 17 U/L (ref 0–44)
AST: 27 U/L (ref 15–41)
Albumin: 4 g/dL (ref 3.5–5.0)
Alkaline Phosphatase: 58 U/L (ref 38–126)
Anion gap: 10 (ref 5–15)
BUN: 18 mg/dL (ref 8–23)
CO2: 22 mmol/L (ref 22–32)
Calcium: 9 mg/dL (ref 8.9–10.3)
Chloride: 107 mmol/L (ref 98–111)
Creatinine, Ser: 1 mg/dL (ref 0.44–1.00)
GFR, Estimated: 56 mL/min — ABNORMAL LOW (ref >60)
Glucose, Bld: 148 mg/dL — ABNORMAL HIGH (ref 70–99)
Potassium: 4.2 mmol/L (ref 3.5–5.1)
Sodium: 139 mmol/L (ref 135–145)
Total Bilirubin: 0.3 mg/dL (ref 0.3–1.2)
Total Protein: 7.2 g/dL (ref 6.5–8.1)

## 2021-06-26 LAB — RAPID URINE DRUG SCREEN, HOSP PERFORMED
Amphetamines: NOT DETECTED
Barbiturates: NOT DETECTED
Benzodiazepines: NOT DETECTED
Cocaine: NOT DETECTED
Opiates: NOT DETECTED
Tetrahydrocannabinol: NOT DETECTED

## 2021-06-26 LAB — CBC
HCT: 46 % (ref 36.0–46.0)
Hemoglobin: 14.7 g/dL (ref 12.0–15.0)
MCH: 32.7 pg (ref 26.0–34.0)
MCHC: 32 g/dL (ref 30.0–36.0)
MCV: 102.4 fL — ABNORMAL HIGH (ref 80.0–100.0)
Platelets: 192 10*3/uL (ref 150–400)
RBC: 4.49 MIL/uL (ref 3.87–5.11)
RDW: 13.5 % (ref 11.5–15.5)
WBC: 4.8 10*3/uL (ref 4.0–10.5)
nRBC: 0 % (ref 0.0–0.2)

## 2021-06-26 LAB — PROTIME-INR
INR: 1.1 (ref 0.8–1.2)
Prothrombin Time: 14.1 seconds (ref 11.4–15.2)

## 2021-06-26 IMAGING — MR MR HEAD W/O CM
8 of 10 series · 35 of 48 positions shown · non-contrast
Comparison: Prior CT from earlier the same day.

CLINICAL DATA: Initial evaluation for acute TIA.

EXAM:
MRI HEAD WITHOUT CONTRAST
MRA HEAD WITHOUT CONTRAST
TECHNIQUE: Multiplanar, multi-echo pulse sequences of the brain and surrounding
structures were acquired without intravenous contrast. Angiographic
images of the Circle of Willis were acquired using MRA technique
without intravenous contrast.

[Series 3: DWI · axial · 3.0mm · 1.09mm/px · z∈[-63,+88]mm · 9 of 104 slices shown (1 of 4)]
[im 1/104]
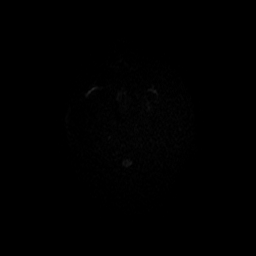
[im 13/104]
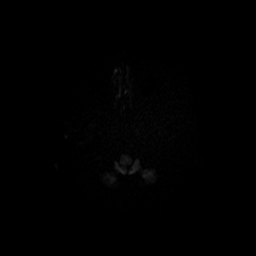
[im 26/104]
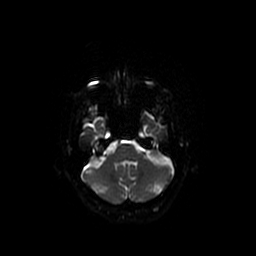
[im 39/104]
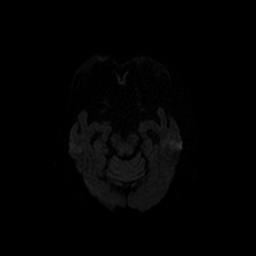
[im 52/104]
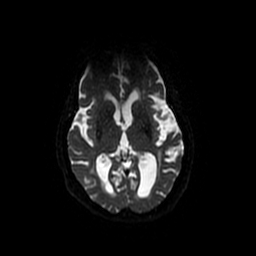
[im 65/104]
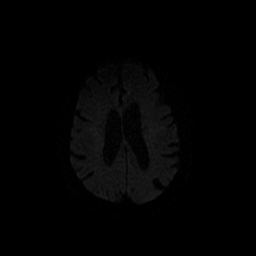
[im 78/104]
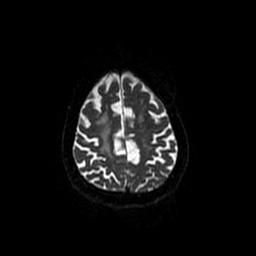
[im 91/104]
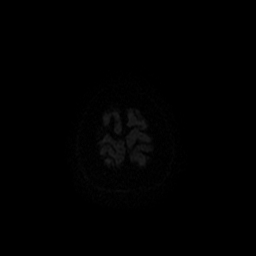
[im 104/104]
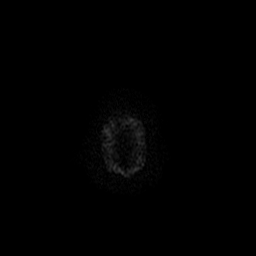

[Series 5: DWI · coronal · 5.0mm · 1.09mm/px · 7 of 74 slices shown (2 of 4)]
[im 1/74]
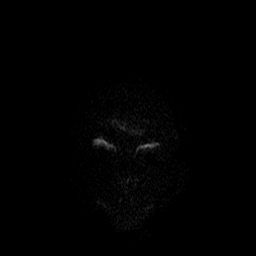
[im 13/74]
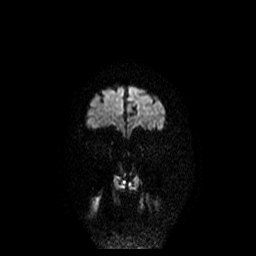
[im 25/74]
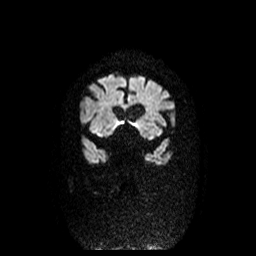
[im 37/74]
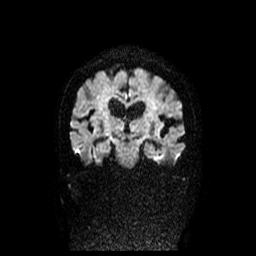
[im 49/74]
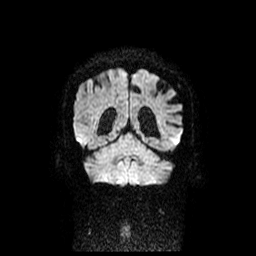
[im 61/74]
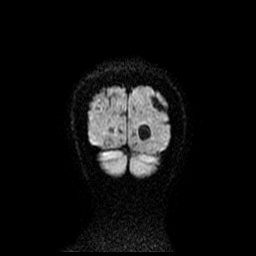
[im 74/74]
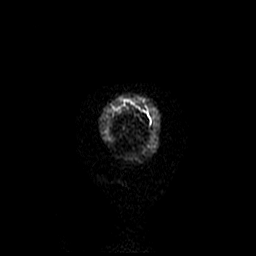

[Series 6: T1 · sagittal · 5.0mm · 0.47mm/px · 1 of 23 slices shown]
[im 1/23]
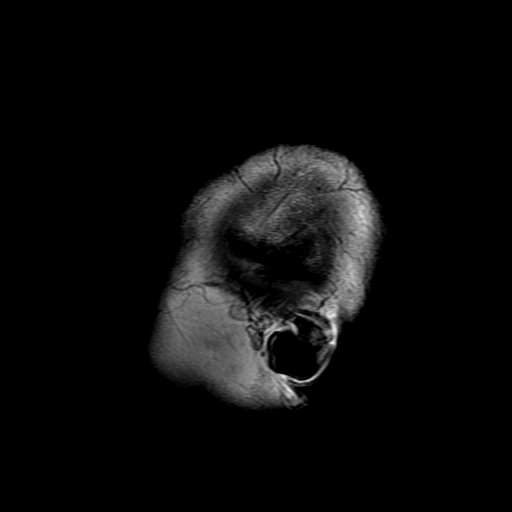

[Series 7: T2 · axial · 5.0mm · 0.43mm/px · z∈[-57,+90]mm · 3 of 26 slices shown (1 of 2)]
[im 1/26]
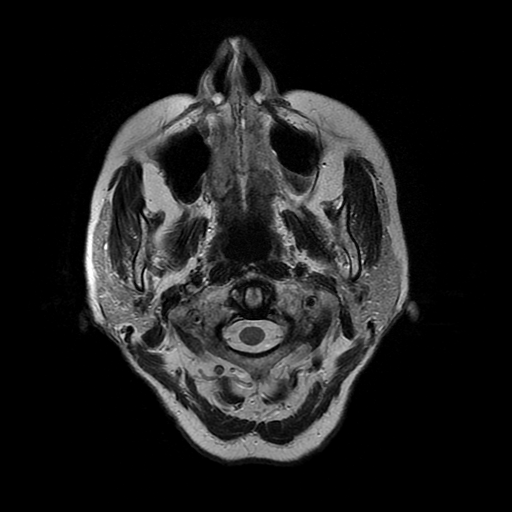
[im 13/26]
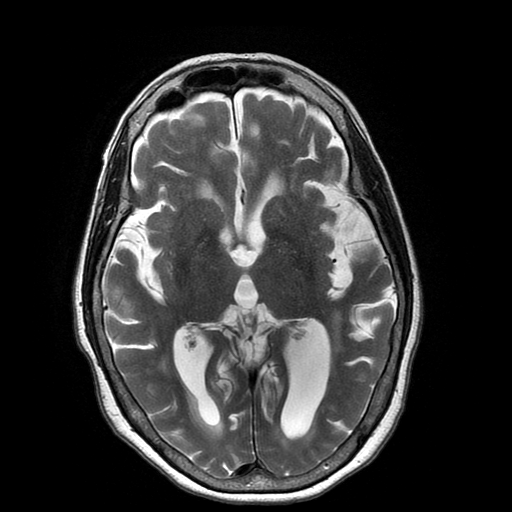
[im 26/26]
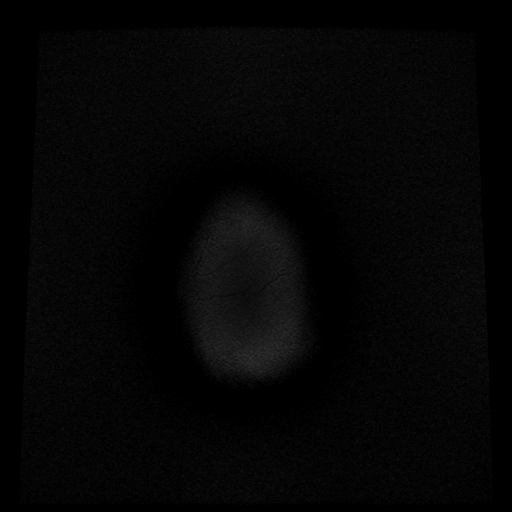

[Series 8: FLAIR · axial · 3.0mm · 0.43mm/px · z∈[-57,+90]mm · 3 of 26 slices shown]
[im 1/26]
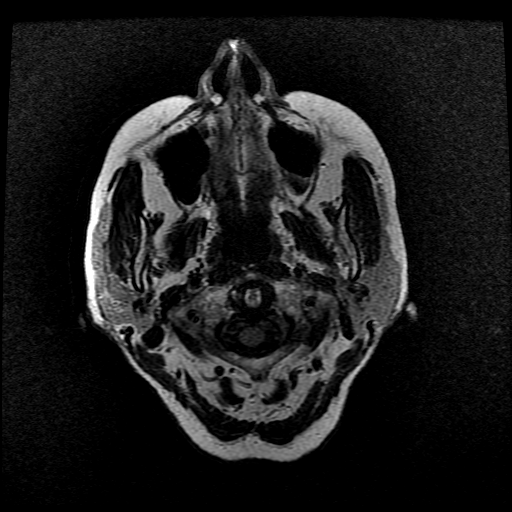
[im 13/26]
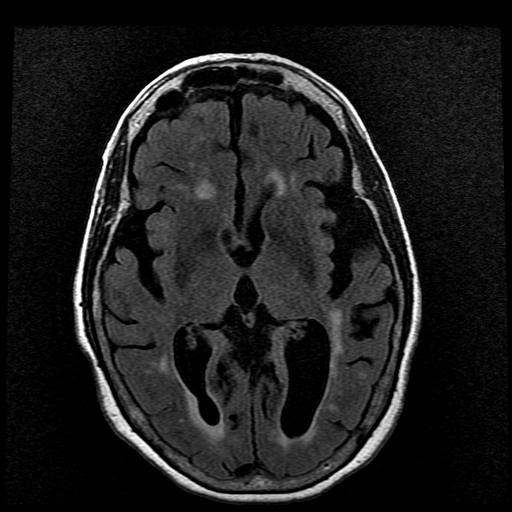
[im 26/26]
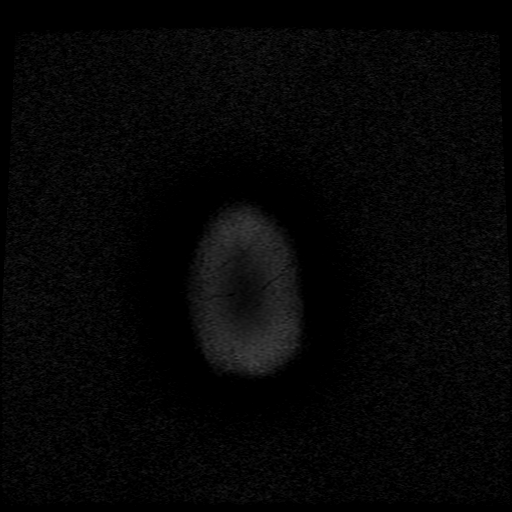

[Series 11: T2 · coronal · 5.0mm · 0.39mm/px · 3 of 28 slices shown (2 of 2)]
[im 1/28]
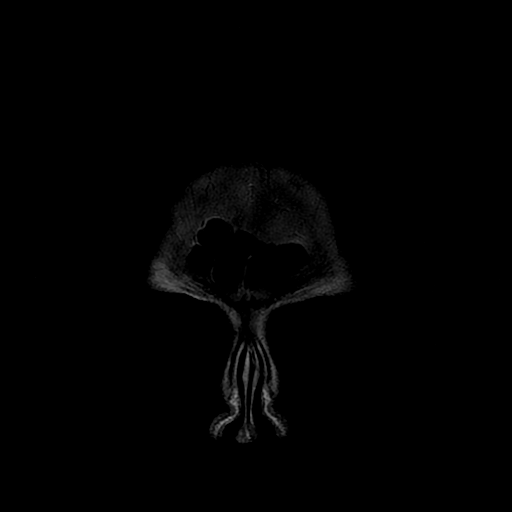
[im 14/28]
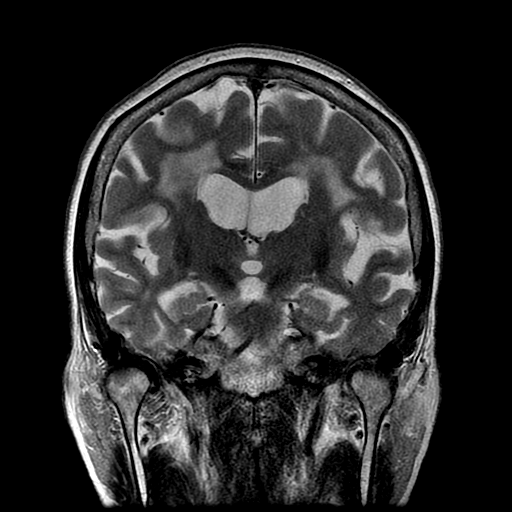
[im 28/28]
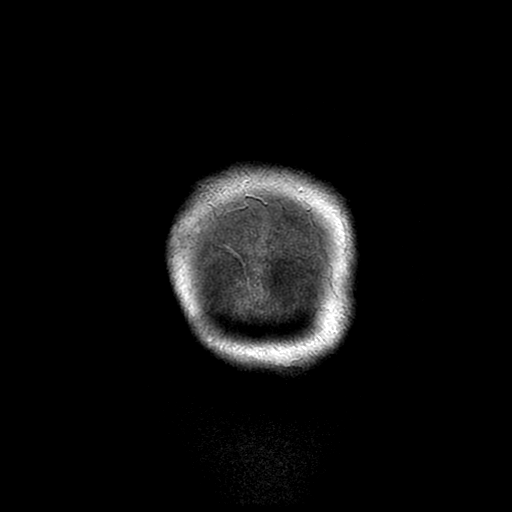

[Series 300: DWI · axial · 3.0mm · 1.09mm/px · z∈[-63,+88]mm · 5 of 52 slices shown (3 of 4)]
[im 1/52]
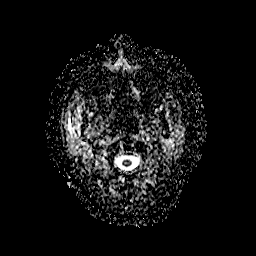
[im 13/52]
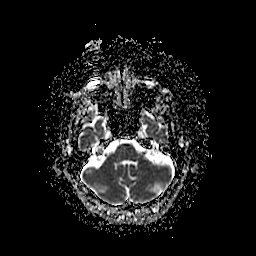
[im 26/52]
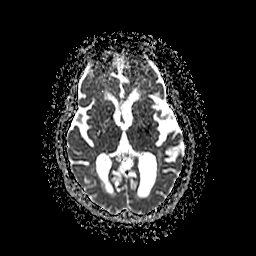
[im 39/52]
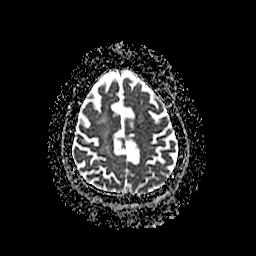
[im 52/52]
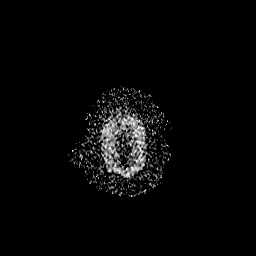

[Series 500: DWI · coronal · 5.0mm · 1.09mm/px · 4 of 37 slices shown (4 of 4)]
[im 1/37]
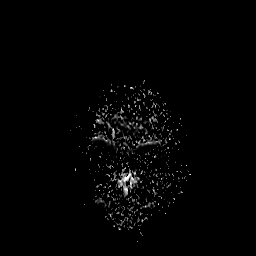
[im 13/37]
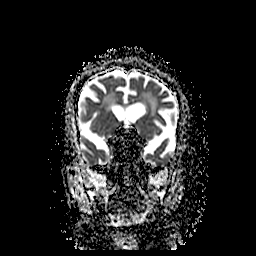
[im 25/37]
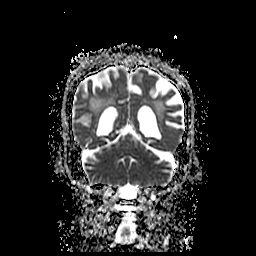
[im 37/37]
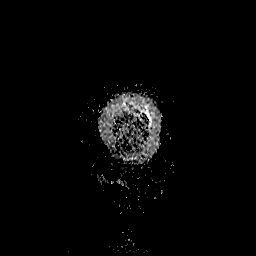

[35 of 48 positions shown; findings below may reference images not displayed]

FINDINGS: MRI HEAD FINDINGS

Brain: Generalized age-related cerebral atrophy with moderate
chronic small vessel ischemic disease. Small remote lacunar infarct
present at the right frontal corona radiata. No evidence for acute
or subacute ischemia. No other areas of chronic cortical infarction.
No acute or chronic intracranial blood products.

No mass lesion, mass effect or midline shift. No hydrocephalus or
extra-axial fluid collection. Pituitary gland suprasellar region
within normal limits.

Vascular: Major intracranial vascular flow voids are maintained.

Skull and upper cervical spine: Cranial junction with normal limits.
Bone marrow signal intensity normal. No scalp soft tissue
abnormality.

Sinuses/Orbits: Prior bilateral ocular lens replacement. Paranasal
sinuses are largely clear. No mastoid effusion.

Other: None.

MRA HEAD FINDINGS

Anterior circulation: Both internal carotid arteries patent to the
termini without stenosis or other abnormality. A1 segments, anterior
communicating artery complex common anterior cerebral arteries
patent without stenosis. Normal in stenosis or occlusion. No
proximal MCA branch occlusion. Distal MCA branches perfused and
symmetric. Distal small vessel atheromatous irregularity.

Posterior circulation: Both vertebral arteries patent to the
vertebrobasilar junction without stenosis. Left vertebral artery
dominant. Left PICA patent. Right PICA not well seen. Basilar patent
to its distal aspect without stenosis. Superior cerebral arteries
patent bilaterally. Right PCA supplied via the basilar. Fetal type
origin of the left PCA. Both PCAs patent to their distal aspects
without stenosis.

Anatomic variants: As above.  No aneurysm.
IMPRESSION: MRI HEAD IMPRESSION:

1. No acute intracranial abnormality.
2. Generalized age-related cerebral atrophy with moderate chronic
small vessel ischemic disease.
3. Small remote lacunar infarct involving the right frontal corona
radiata.

MRA HEAD IMPRESSION:

1. Negative intracranial MRA for large vessel occlusion. No
hemodynamically significant or correctable stenosis.
2. Distal small vessel atheromatous irregularity.

## 2021-06-26 IMAGING — CT CT HEAD W/O CM
4 of 5 series · 15 of 47 positions shown, 17 images · non-contrast
Comparison: Report from head CT [DATE] (images unavailable).

CLINICAL DATA: Provided history: Transient ischemic attack.
Additional history provided: Blurry vision in left eye lasting
several minutes.



[Series 3: head without · axial · non-contrast · 0.43mm/px · z∈[-156,-42]mm · 5 of 35 slices shown, 7 images]
[im 6/35  brain]
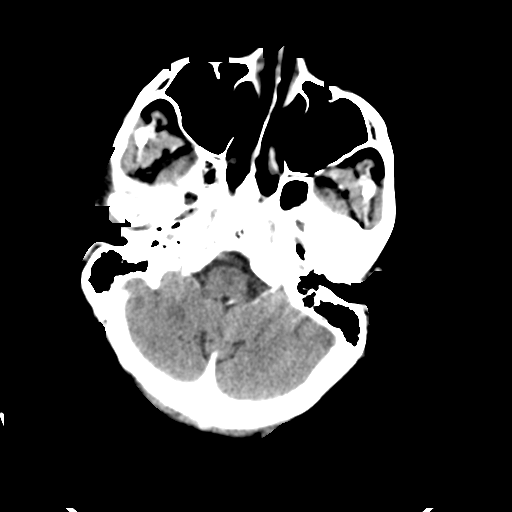
[im 6/35  bone]
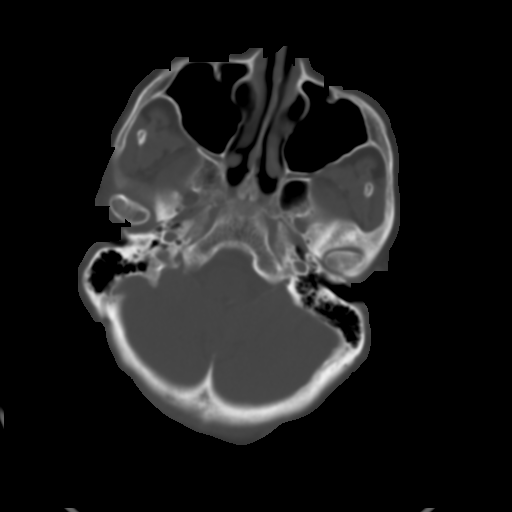
[im 12/35  brain]
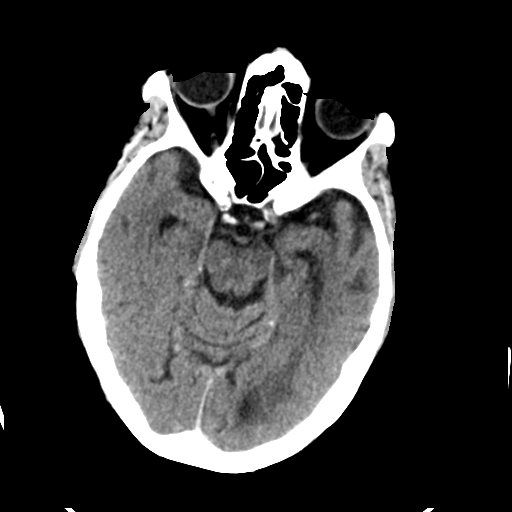
[im 18/35  brain]
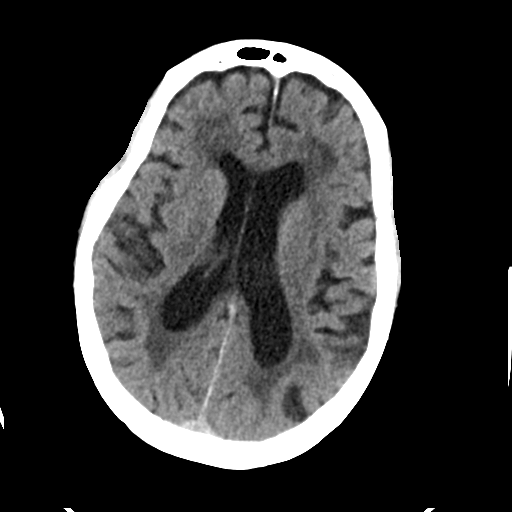
[im 23/35  brain]
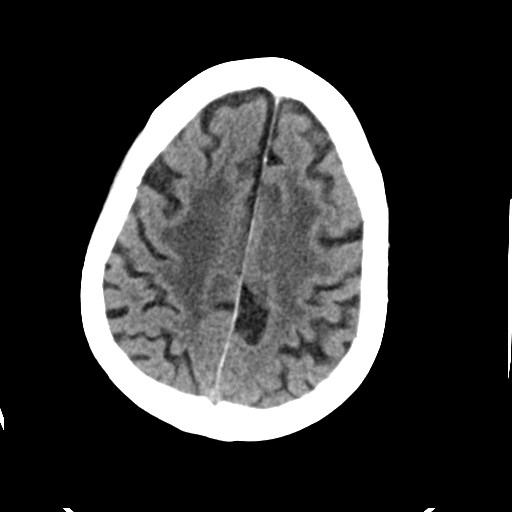
[im 29/35  brain]
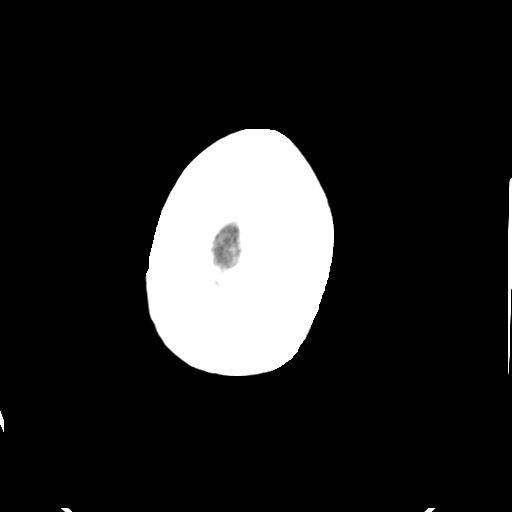
[im 29/35  bone]
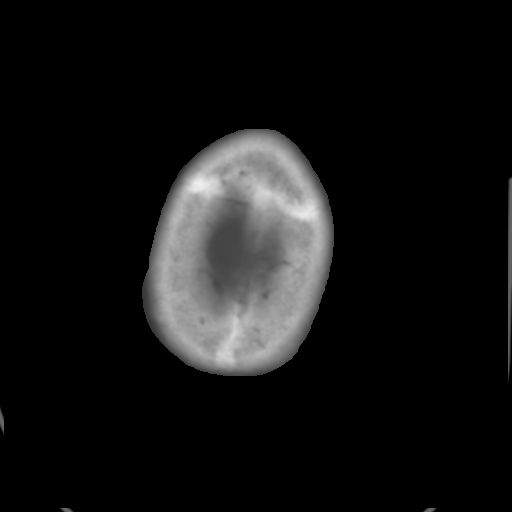

[Series 5: head without cor · coronal · non-contrast · 0.32mm/px · 3 of 72 slices shown]
[im 24/72  brain]
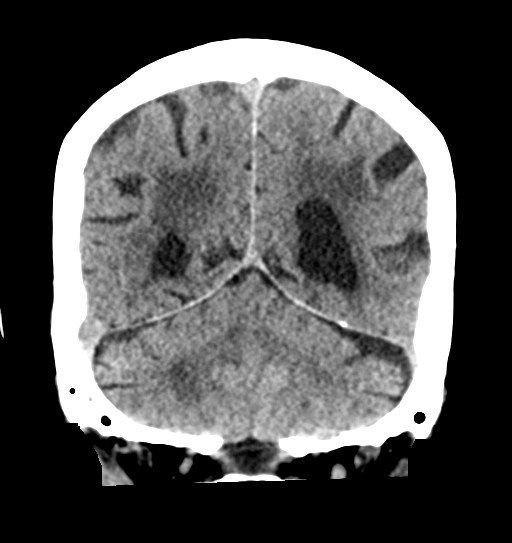
[im 32/72  brain]
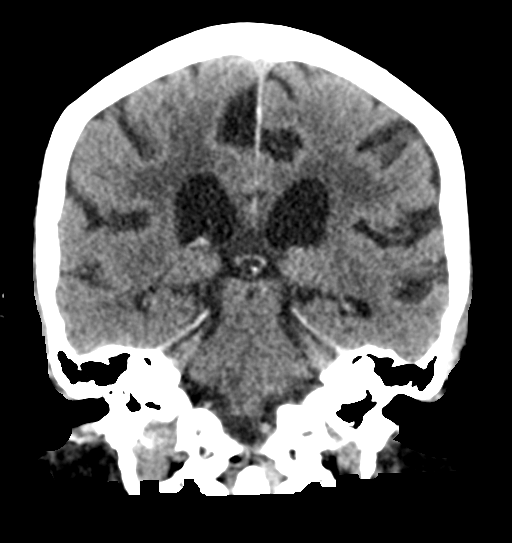
[im 40/72  brain]
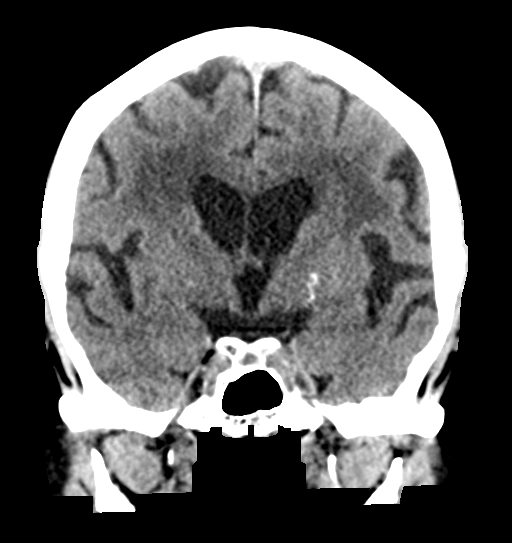

[Series 6: head without sag · sagittal · non-contrast · 0.33mm/px · 3 of 57 slices shown]
[im 19/57  brain]
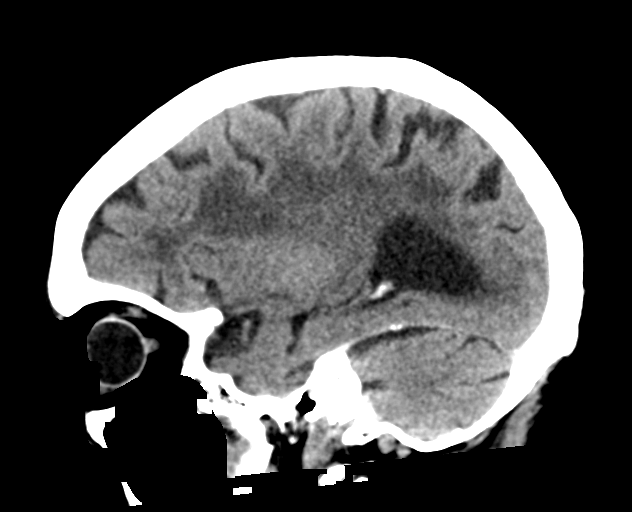
[im 29/57  brain]
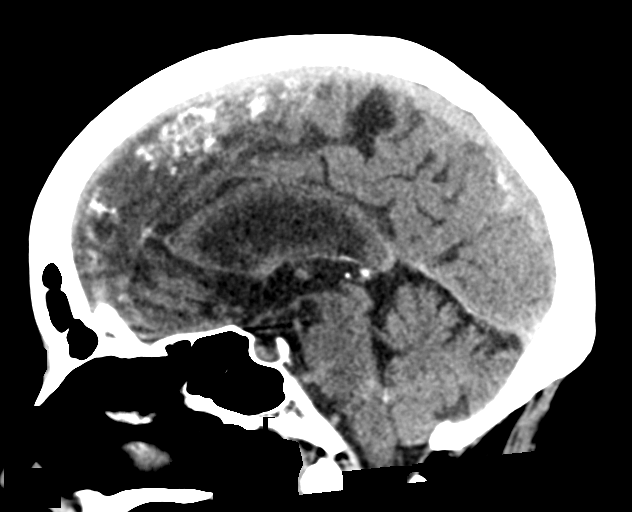
[im 38/57  brain]
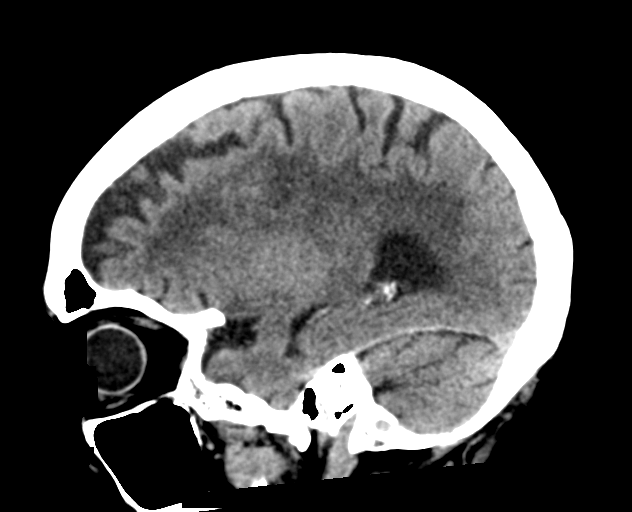

[Series 7: true axial · axial · 0.33mm/px · z∈[-130,-36]mm · 4 of 33 slices shown]
[im 7/33  brain]
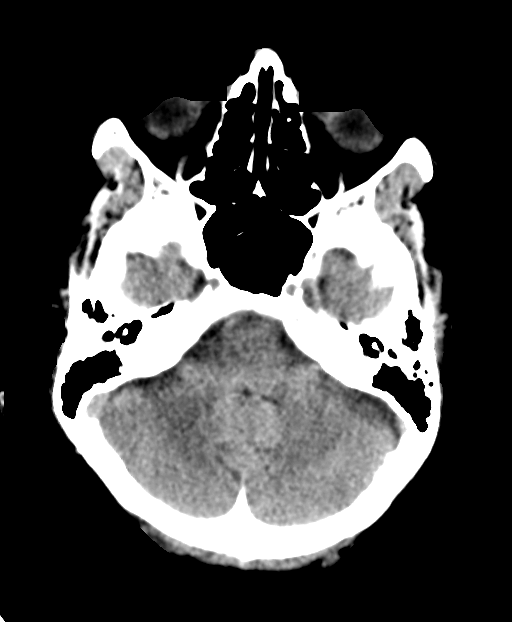
[im 13/33  brain]
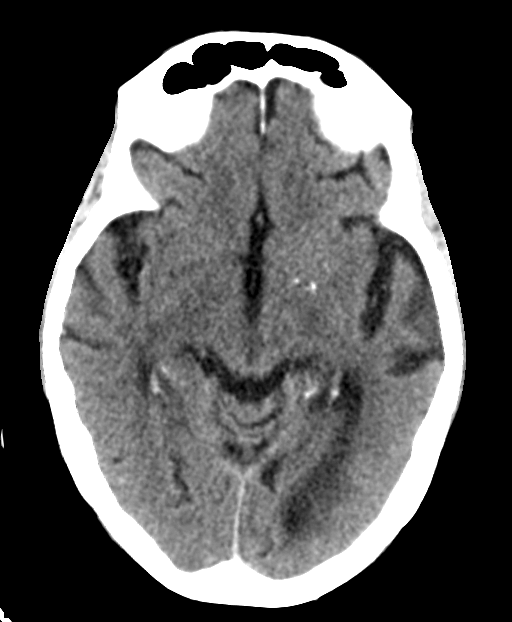
[im 20/33  brain]
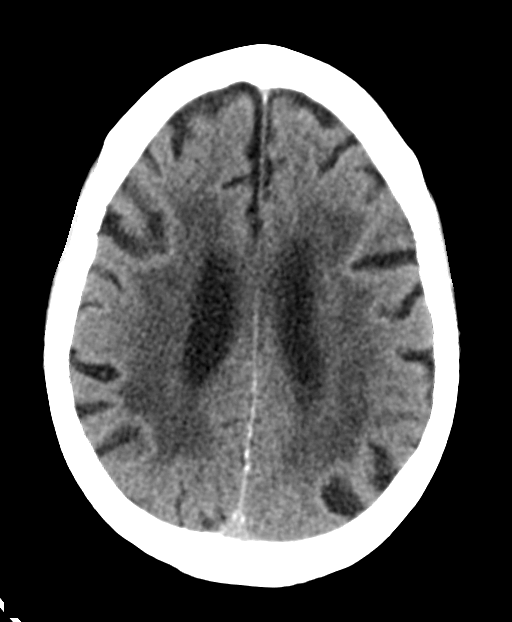
[im 26/33  brain]
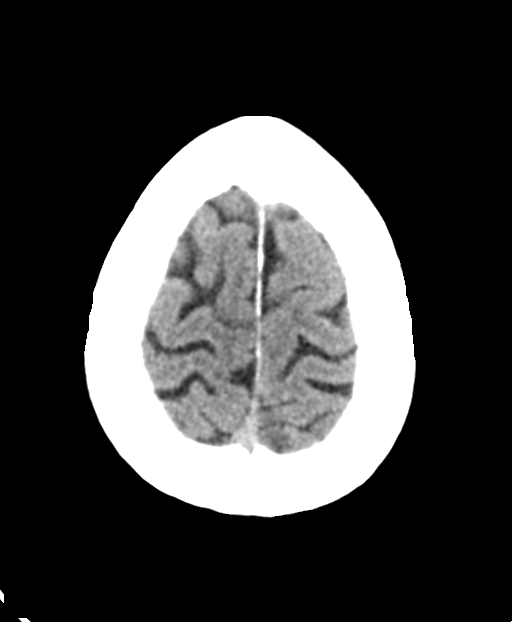

[15 of 47 positions shown; findings below may reference images not displayed]

FINDINGS: Brain:

Mild-to-moderate generalized cerebral atrophy.

Advanced patchy and confluent hypoattenuation within the cerebral
white matter, nonspecific but compatible with chronic small vessel
ischemic disease.

4 mm parenchymal calcification within the right frontal lobe
periventricular white matter, likely benign (series 3, image 20).

There is no acute intracranial hemorrhage.

No demarcated cortical infarct.

No extra-axial fluid collection.

No evidence of an intracranial mass.

No midline shift.

Vascular: No hyperdense vessel. Atherosclerotic calcifications.

Skull: No fracture or aggressive osseous lesion.

Sinuses/Orbits: No mass or acute finding within the imaged orbits.
Minimal mucosal thickening within the left frontal ethmoidal recess
IMPRESSION: No evidence of acute intracranial abnormality.

Advanced chronic small vessel ischemic changes within the cerebral
white matter.

Mild-to-moderate generalized cerebral atrophy.

## 2021-06-26 IMAGING — MR MR MRA HEAD W/O CM
3 series · 15 of 48 positions shown · non-contrast
Comparison: Prior CT from earlier the same day.

CLINICAL DATA: Initial evaluation for acute TIA.

EXAM:
MRI HEAD WITHOUT CONTRAST
MRA HEAD WITHOUT CONTRAST
TECHNIQUE: Multiplanar, multi-echo pulse sequences of the brain and surrounding
structures were acquired without intravenous contrast. Angiographic
images of the Circle of Willis were acquired using MRA technique
without intravenous contrast.

[Series 4: (id) mt fs · axial · 1.4mm · 0.43mm/px · z∈[-53,+35]mm · 13 of 136 slices shown]
[im 1/136]
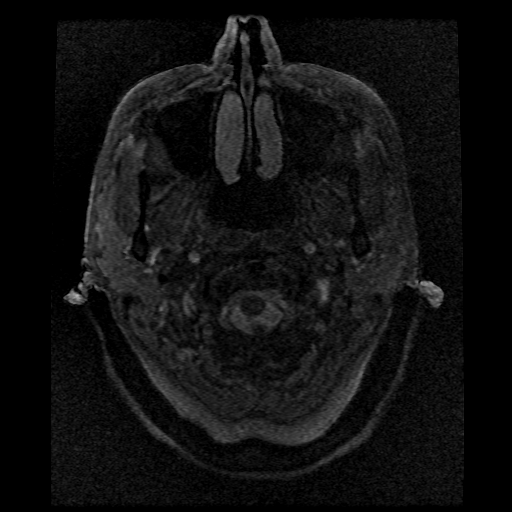
[im 4/136]
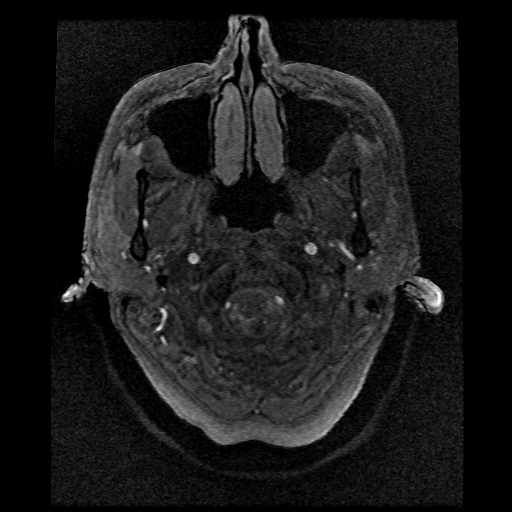
[im 7/136]
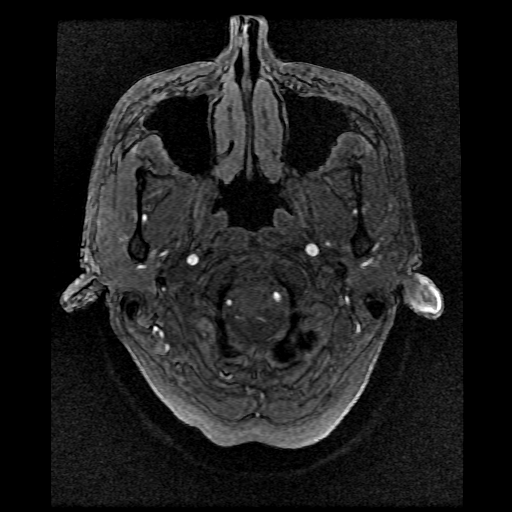
[im 22/136]
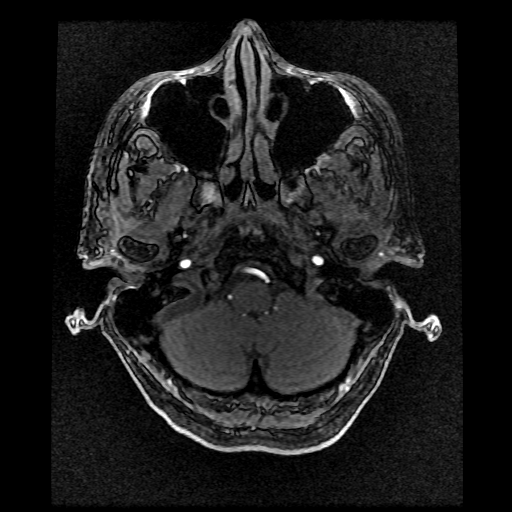
[im 25/136]
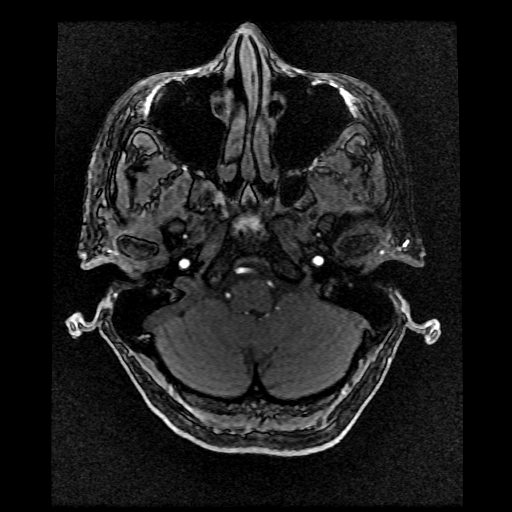
[im 43/136]
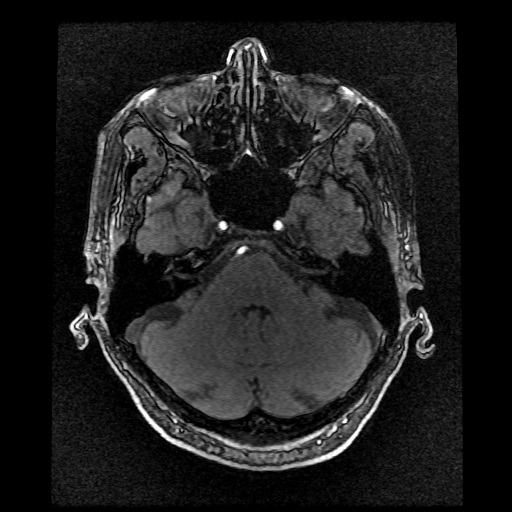
[im 61/136]
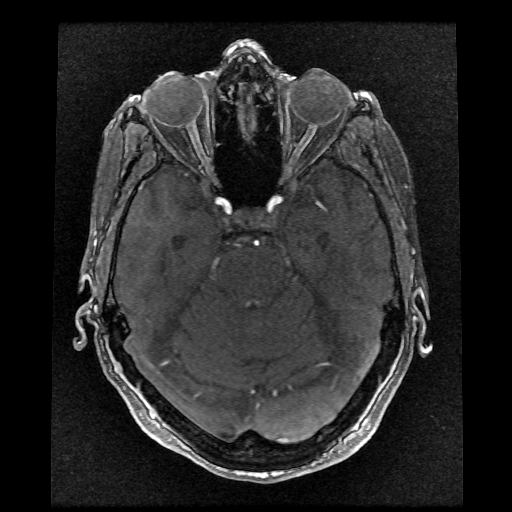
[im 70/136]
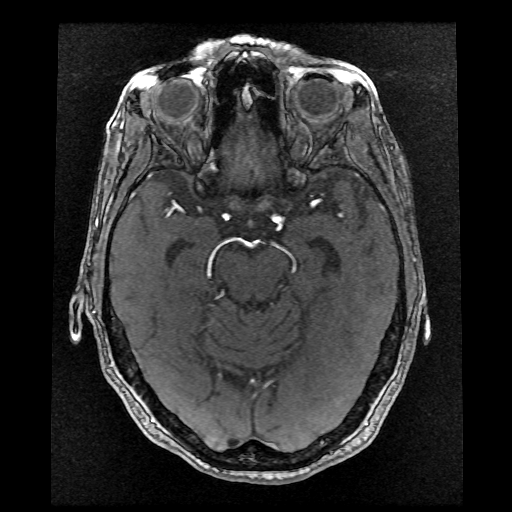
[im 76/136]
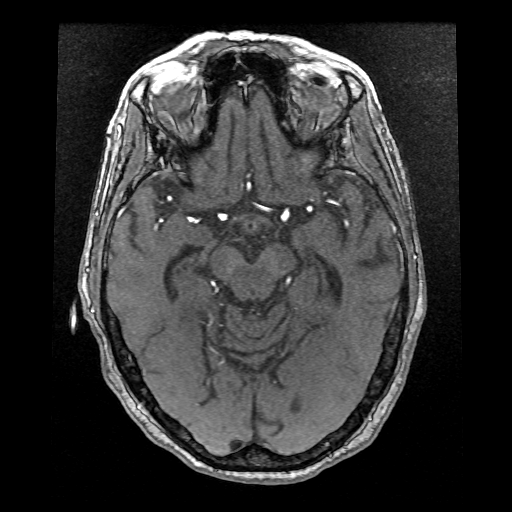
[im 94/136]
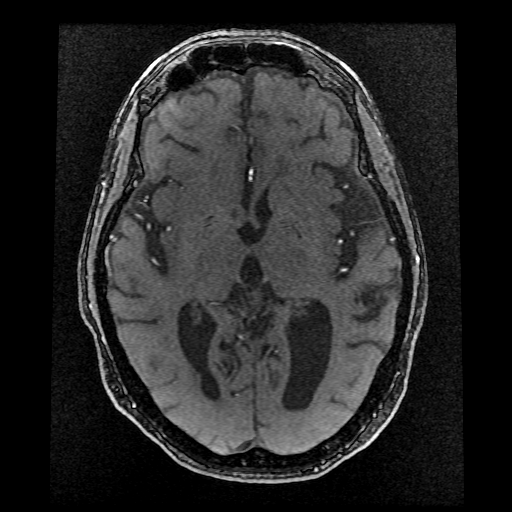
[im 112/136]
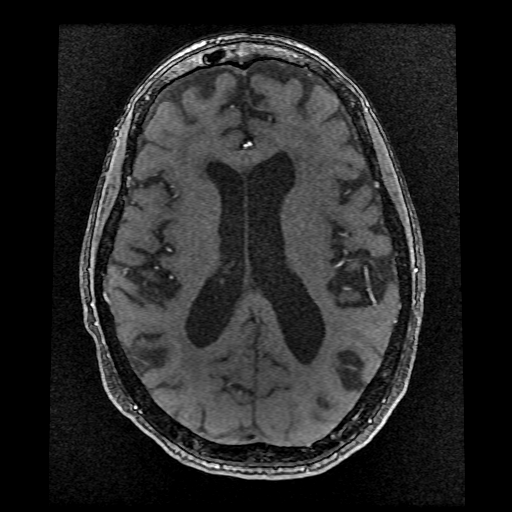
[im 115/136]
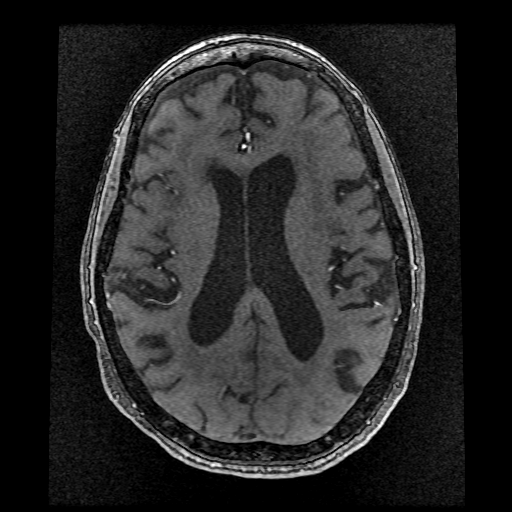
[im 130/136]
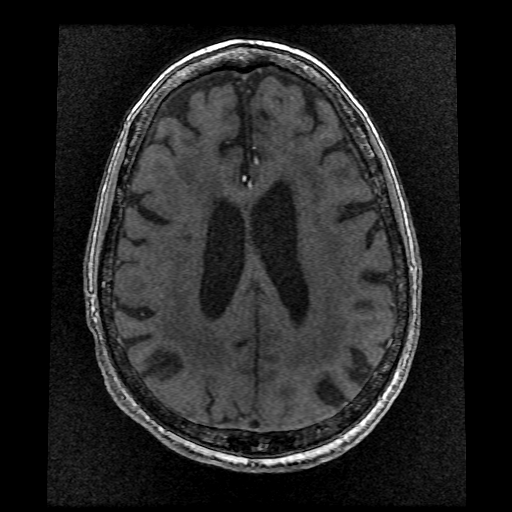

[Series 400: col:(id) mt fs · axial · 1.4mm · 0.43mm/px · 1 of 1 slices shown]
[im 1/1]
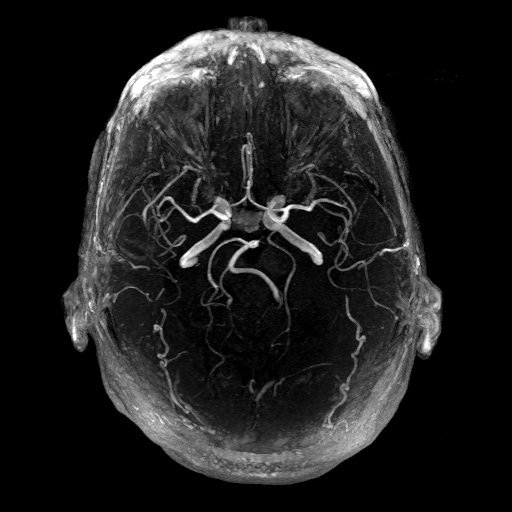

[Series 403: projection images · axial · 1.4mm · 0.21mm/px · 1 of 1 slices shown]
[im 1/1]
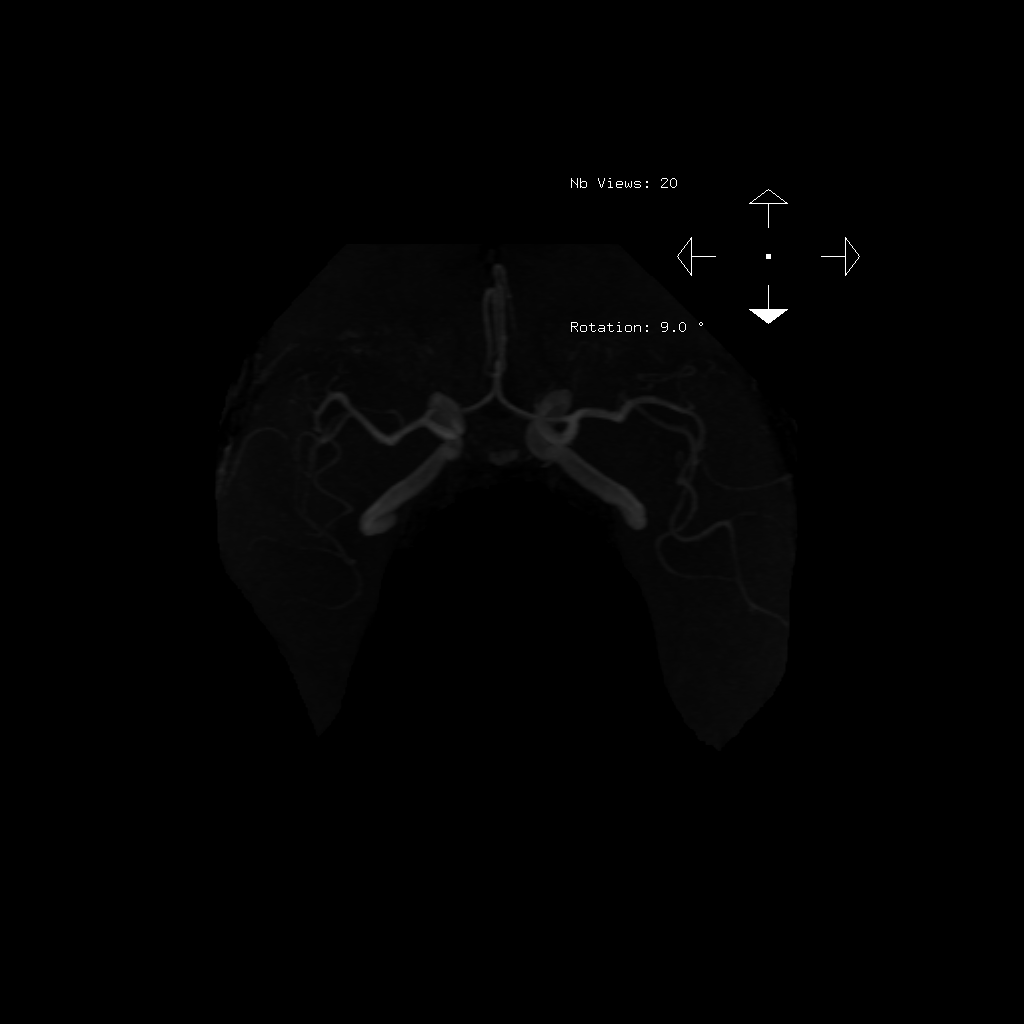

[15 of 48 positions shown; findings below may reference images not displayed]

FINDINGS: MRI HEAD FINDINGS

Brain: Generalized age-related cerebral atrophy with moderate
chronic small vessel ischemic disease. Small remote lacunar infarct
present at the right frontal corona radiata. No evidence for acute
or subacute ischemia. No other areas of chronic cortical infarction.
No acute or chronic intracranial blood products.

No mass lesion, mass effect or midline shift. No hydrocephalus or
extra-axial fluid collection. Pituitary gland suprasellar region
within normal limits.

Vascular: Major intracranial vascular flow voids are maintained.

Skull and upper cervical spine: Cranial junction with normal limits.
Bone marrow signal intensity normal. No scalp soft tissue
abnormality.

Sinuses/Orbits: Prior bilateral ocular lens replacement. Paranasal
sinuses are largely clear. No mastoid effusion.

Other: None.

MRA HEAD FINDINGS

Anterior circulation: Both internal carotid arteries patent to the
termini without stenosis or other abnormality. A1 segments, anterior
communicating artery complex common anterior cerebral arteries
patent without stenosis. Normal in stenosis or occlusion. No
proximal MCA branch occlusion. Distal MCA branches perfused and
symmetric. Distal small vessel atheromatous irregularity.

Posterior circulation: Both vertebral arteries patent to the
vertebrobasilar junction without stenosis. Left vertebral artery
dominant. Left PICA patent. Right PICA not well seen. Basilar patent
to its distal aspect without stenosis. Superior cerebral arteries
patent bilaterally. Right PCA supplied via the basilar. Fetal type
origin of the left PCA. Both PCAs patent to their distal aspects
without stenosis.

Anatomic variants: As above.  No aneurysm.
IMPRESSION: MRI HEAD IMPRESSION:

1. No acute intracranial abnormality.
2. Generalized age-related cerebral atrophy with moderate chronic
small vessel ischemic disease.
3. Small remote lacunar infarct involving the right frontal corona
radiata.

MRA HEAD IMPRESSION:

1. Negative intracranial MRA for large vessel occlusion. No
hemodynamically significant or correctable stenosis.
2. Distal small vessel atheromatous irregularity.

## 2021-06-26 NOTE — Discharge Instructions (Addendum)
Your work-up today did not show evidence of acute stroke.  Given the transient symptoms, neurology recommended you following with both outpatient neurology as well as ophthalmology.  Please rest and stay hydrated.  Any symptoms change or worsen acutely, please return to the nearest emergency department.

## 2021-06-26 NOTE — ED Provider Notes (Signed)
[facility] EMERGENCY DEPARTMENT Provider Note   CSN: [id number] Arrival date & time: 06/26/21  1541     History  Chief Complaint  Patient presents with   Blurred Vision    Selena Taylor is a 86 y.o. female.  The history is provided by the patient, a relative and medical records. No language interpreter was used.  Eye Problem Location:  Left eye Quality: blurry vision. Severity:  Severe Onset quality:  Sudden Duration:  5 minutes Timing:  Rare Progression:  Resolved Chronicity:  New Relieved by:  Nothing Worsened by:  Nothing Ineffective treatments:  None tried Associated symptoms: blurred vision and decreased vision   Associated symptoms: no crusting, no double vision, no facial rash, no headaches, no inflammation, no nausea, no numbness, no photophobia, no redness, no swelling, no tearing, no tingling, no vomiting and no weakness       Home Medications Prior to Admission medications   Medication Sig Start Date End Date Taking? Authorizing Provider  Cholecalciferol (VITAMIN D3) 2000 units capsule Take 1 capsule (2,000 Units total) by mouth daily. 08/13/16   Plotnikov, Evie Lacks, MD  denosumab (PROLIA) 60 MG/ML SOSY injection Inject 60 mg into the skin every 6 (six) months.    [provider]  diltiazem (CARDIZEM CD) 240 MG 24 hr capsule TAKE 1 CAPSULE(240 MG) BY MOUTH DAILY 05/01/21   Deboraha Sprang, MD  hydrocortisone 2.5 % ointment Apply topically 3 (three) times daily. 04/30/21   Plotnikov, Evie Lacks, MD  nebivolol (BYSTOLIC) 5 MG tablet Take 0.5 tablets (2.5 mg total) by mouth daily. 05/04/21   Deboraha Sprang, MD  ramipril (ALTACE) 10 MG capsule TAKE 1 CAPSULE(10 MG) BY MOUTH DAILY 08/22/20   Deboraha Sprang, MD  Rivaroxaban (XARELTO) 15 MG TABS tablet TAKE 1 TABLET(15 MG) BY MOUTH DAILY WITH SUPPER 06/24/21   Deboraha Sprang, MD  zolpidem (AMBIEN) 10 MG tablet TAKE 1 TABLET BY MOUTH AT BEDTIME AS NEEDED FOR SLEEP 03/30/21   Plotnikov, Evie Lacks, MD       Allergies    Other and Procaine hcl    Review of Systems   Review of Systems  Constitutional:  Negative for chills, diaphoresis, fatigue and fever.  HENT:  Negative for congestion, ear pain and sore throat.   Eyes:  Positive for blurred vision and visual disturbance (resolved now). Negative for double vision, photophobia, pain and redness.  Respiratory:  Negative for cough, chest tightness, shortness of breath and wheezing.   Cardiovascular:  Negative for chest pain and palpitations.  Gastrointestinal:  Negative for abdominal pain, nausea and vomiting.  Genitourinary:  Negative for dysuria and hematuria.  Musculoskeletal:  Negative for arthralgias and back pain.  Skin:  Negative for color change and rash.  Neurological:  Negative for dizziness, tingling, seizures, syncope, speech difficulty, weakness, light-headedness, numbness and headaches.  Psychiatric/Behavioral:  Negative for agitation and confusion.   All other systems reviewed and are negative.  Physical Exam Updated Vital Signs BP (!) 186/94 (BP Location: Right Arm)   Pulse 97   Temp 99 F (37.2 C) (Oral)   Resp 18   Ht 5\' 2"  (1.575 m)   Wt 58.1 kg   SpO2 100%   BMI 23.41 kg/m  Physical Exam Vitals and nursing note reviewed.  Constitutional:      General: She is not in acute distress.    Appearance: She is well-developed. She is not ill-appearing, toxic-appearing or diaphoretic.  HENT:     Head:  Normocephalic and atraumatic.     Mouth/Throat:     Mouth: Mucous membranes are moist.  Eyes:     General: No scleral icterus.       Right eye: No discharge.        Left eye: No discharge.     Extraocular Movements: Extraocular movements intact.     Conjunctiva/sclera: Conjunctivae normal.     Pupils: Pupils are equal, round, and reactive to light.  Cardiovascular:     Rate and Rhythm: Normal rate and regular rhythm.     Heart sounds: No murmur heard. Pulmonary:     Effort: Pulmonary effort is normal. No  respiratory distress.     Breath sounds: Normal breath sounds.  Abdominal:     Palpations: Abdomen is soft.     Tenderness: There is no abdominal tenderness. There is no guarding or rebound.  Musculoskeletal:        General: No swelling.     Cervical back: Neck supple.  Skin:    General: Skin is warm and dry.     Capillary Refill: Capillary refill takes less than 2 seconds.  Neurological:     General: No focal deficit present.     Mental Status: She is alert.     Cranial Nerves: No cranial nerve deficit, dysarthria or facial asymmetry.     Sensory: No sensory deficit.     Motor: No weakness, abnormal muscle tone or seizure activity.     Coordination: Coordination normal.     Comments: Patient had subtle red desaturation in the left eye compared to her right.  No other focal deficits seen.  Psychiatric:        Mood and Affect: Mood normal.    ED Results / Procedures / Treatments   Labs (all labs ordered are listed, but only abnormal results are displayed) Labs Reviewed  CBC - Abnormal; Notable for the following components:      Result Value   MCV 102.4 (*)    All other components within normal limits  COMPREHENSIVE METABOLIC PANEL - Abnormal; Notable for the following components:   Glucose, Bld 148 (*)    GFR, Estimated 56 (*)    All other components within normal limits  RAPID URINE DRUG SCREEN, HOSP PERFORMED  PROTIME-INR    EKG EKG Interpretation  Date/Time:  Friday June 26 2021 15:49:36 EDT Ventricular Rate:  110 PR Interval:    QRS Duration: 74 QT Interval:  304 QTC Calculation: 411 R Axis:   42 Text Interpretation: Atrial fibrillation with rapid ventricular response Septal infarct , age undetermined Marked ST abnormality, possible inferior subendocardial injury Abnormal ECG When compared with ECG of 26-Feb-2012 07:09, PREVIOUS ECG IS PRESENT when compared to prior, similar afib with faster rate and some more pronounced st depression throughout. No STEMI  Confirmed by Antony Blackbird [phone]) on 06/26/2021 4:55:37 PM  Radiology CT HEAD WO CONTRAST  Result Date: 06/26/2021 CLINICAL DATA:  Provided history: Transient ischemic attack. Additional history provided: Blurry vision in left eye lasting several minutes. EXAM: CT HEAD WITHOUT CONTRAST TECHNIQUE: Contiguous axial images were obtained from the base of the skull through the vertex without intravenous contrast. RADIATION DOSE REDUCTION: This exam was performed according to the departmental dose-optimization program which includes automated exposure control, adjustment of the mA and/or kV according to patient size and/or use of iterative reconstruction technique. COMPARISON:  Report from head CT 09/17/1998 (images unavailable). FINDINGS: Brain: Mild-to-moderate generalized cerebral atrophy. Advanced patchy and confluent hypoattenuation within the cerebral white  matter, nonspecific but compatible with chronic small vessel ischemic disease. 4 mm parenchymal calcification within the right frontal lobe periventricular white matter, likely benign (series 3, image 20). There is no acute intracranial hemorrhage. No demarcated cortical infarct. No extra-axial fluid collection. No evidence of an intracranial mass. No midline shift. Vascular: No hyperdense vessel. Atherosclerotic calcifications. Skull: No fracture or aggressive osseous lesion. Sinuses/Orbits: No mass or acute finding within the imaged orbits. Minimal mucosal thickening within the left frontal ethmoidal recess IMPRESSION: No evidence of acute intracranial abnormality. Advanced chronic small vessel ischemic changes within the cerebral white matter. Mild-to-moderate generalized cerebral atrophy. Electronically Signed   By: Kellie Simmering D.O.   On: 06/26/2021 16:35    Procedures Procedures    Medications Ordered in ED Medications - No data to display  ED Course/ Medical Decision Making/ A&P                           Medical Decision Making   MIHIKA DONOGHUE is a 86 y.o. female with a past medical history significant for hypertension, previous TIA, and atrial fibrillation on Xarelto who presents at the direction of her cardiologist for further evaluation of transient left vision changes.  According to patient, she was playing chess with her family at around 12:30 PM when she had approximately 5 minutes of left eye blurry vision.  She reports that it felt like it was "underwater" and very blurry throughout.  There was no vision change in the right eye reportedly.  She denies diplopia.  Denies any headache or neck pain.  Denies any temple pain.  Denies any nausea, vomiting, numbness, tingling, weakness of extremities.  Did not have facial droop or speech difficulties.  She simply reports the vision changes that went away causing her to call her cardiologist.  She does have that her blood pressure has been higher in the 180s but she denies any trauma, neck manipulation, chiropractor, or massage therapy of the neck recently.  Denies any other complaints.  On exam, lungs clear and chest nontender.  Abdomen nontender.  No carotid bruit appreciated.  Neck nontender.  Patient has intact sensation, strength, and pulses in extremities.  She does have diffuse mild tremor at rest.  Pupils are symmetric and reactive with normal extraocular movements.  No tenderness of the face or temporal area.  No rash seen on the face.  Patient did have some mild abnormality with red desaturation in the left eye compared to the right when looking at a red object.  Patient was seen in triage and had a CT of the head that did not show acute stroke but did show some chronic microvascular changes.  I spoke to neurology who agreed with getting an MRI without of the brain and MRA of the brain.  He did not feel that a with and without contrast image of the brain and orbits would be needed.  If there is no evidence of stroke, he feels she would be appropriate to follow-up with  ophthalmology.  Anticipate reassessment after work-up.  7:21 PM MRI just returned without evidence of acute stroke.  Remote lacunar infarct seen.  MRA does not show LVO or acute obstruction.  Given the lack of stroke seen today, we will have patient discharged and she will follow-up with her PCP and outpatient neurology team.         Final Clinical Impression(s) / ED Diagnoses Final diagnoses:  Transient vision disturbance of left  eye    Rx / DC Orders ED Discharge Orders     None       Clinical Impression: 1. Transient vision disturbance of left eye     Disposition: Discharge  Condition: Good  I have discussed the results, Dx and Tx plan with the pt(& family if present). He/she/they expressed understanding and agree(s) with the plan. Discharge instructions discussed at great length. Strict return precautions discussed and pt &/or family have verbalized understanding of the instructions. No further questions at time of discharge.    New Prescriptions   No medications on file    Follow Up: Stacy Brownlee, [place] [address] [place] [phone]    your ophthalmologist        Minaal Struckman, Gwenyth Allegra, MD 06/26/21 [phone]

## 2021-06-26 NOTE — Telephone Encounter (Addendum)
Reviewed information with Dr Acie Fredrickson, DOD who advises with pt's history of TIA and elevated BP she should be further evaluated in the ED.   Pt advised of recommendation per Dr Acie Fredrickson and agrees with current plan.

## 2021-06-26 NOTE — ED Triage Notes (Signed)
Patient reports around 1200 she had left eyed blurry vision that lasted a few minutes and called cards that sent her here bc she has hx of tx and afib.  Takes xarelto.

## 2021-06-26 NOTE — Telephone Encounter (Signed)
Patient c/o Palpitations:  High priority if patient c/o lightheadedness, shortness of breath, or chest pain  How long have you had palpitations/irregular HR/ Afib? Are you having the symptoms now? TODAY  Are you currently experiencing lightheadedness, SOB or CP?  No  Do you have a history of afib (atrial fibrillation) or irregular heart rhythm? Yes  Have you checked your BP or HR? (document readings if available): NO  Are you experiencing any other symptoms? Blurred vision and shaky

## 2021-06-26 NOTE — ED Notes (Signed)
Patient transported to MRI 

## 2021-06-26 NOTE — Telephone Encounter (Signed)
Spoke with pt who reports she feels "shaky" today and had an episode of blurred vision in her left eye which lasted for about 5 minutes,  It has since resolved.  She denies current CP, dizziness or SOB but reports she does feel anxious. The pt is in permanent Afib and anticoagulated.  Requested she check her BP and HR as she has not taken today.  Current BP - 185/101.  She is taking her medications as prescribed. Pt advised Dr Graciela Husbands is working in the hospital today but will discuss with Dr Elease Hashimoto, DOD.  Reviewed ED precautions.  Pt verbalizes understanding and agrees with current plan.

## 2021-06-26 NOTE — ED Provider Triage Note (Signed)
Emergency Medicine Provider Triage Evaluation Note  Selena Taylor , a 86 y.o. female  was evaluated in triage.  Pt complains of visual changes. Approximately 4 hrs pt experienced transient blurry vision to L eye lasting for a few minutes and resolved.  She reached out to her cardiologist and was recommended to come to the ER for TIA work up.  Has hx of TIA in the past, currently on Xarelto for afib.  Denies headache, confusion, focal numbness or focal weakness.  Endorse increase stress, her sister died recently  Review of Systems  Positive: As above Negative: As above  Physical Exam  BP (!) 186/94 (BP Location: Right Arm)   Pulse 97   Temp 99 F (37.2 C) (Oral)   Resp 18   SpO2 100%  Gen:   Awake, no distress   Resp:  Normal effort  MSK:   Moves extremities without difficulty  Other:    Medical Decision Making  Medically screening exam initiated at 3:52 PM.  Appropriate orders placed.  Selena Taylor was informed that the remainder of the evaluation will be completed by another provider, this initial triage assessment does not replace that evaluation, and the importance of remaining in the ED until their evaluation is complete.     Domenic Moras, PA-C 06/26/21 1600

## 2021-07-06 ENCOUNTER — Ambulatory Visit: Payer: Medicare Other | Admitting: Internal Medicine

## 2021-07-08 ENCOUNTER — Encounter: Payer: Self-pay | Admitting: Internal Medicine

## 2021-07-08 ENCOUNTER — Ambulatory Visit: Payer: Medicare Other | Admitting: Internal Medicine

## 2021-07-08 VITALS — BP 140/90 | HR 84 | Temp 98.2°F | Ht 62.0 in

## 2021-07-08 DIAGNOSIS — I10 Essential (primary) hypertension: Secondary | ICD-10-CM | POA: Diagnosis not present

## 2021-07-08 DIAGNOSIS — G43109 Migraine with aura, not intractable, without status migrainosus: Secondary | ICD-10-CM | POA: Diagnosis not present

## 2021-07-08 DIAGNOSIS — M810 Age-related osteoporosis without current pathological fracture: Secondary | ICD-10-CM

## 2021-07-08 DIAGNOSIS — R059 Cough, unspecified: Secondary | ICD-10-CM | POA: Insufficient documentation

## 2021-07-08 DIAGNOSIS — R052 Subacute cough: Secondary | ICD-10-CM

## 2021-07-08 MED ORDER — BENZONATATE 200 MG PO CAPS: 200.0000 mg | ORAL_CAPSULE | Freq: Three times a day (TID) | ORAL | 0 refills | Status: DC | PRN

## 2021-07-08 MED ORDER — LOSARTAN POTASSIUM-HCTZ 100-12.5 MG PO TABS: 1.0000 | ORAL_TABLET | Freq: Every day | ORAL | 3 refills | Status: DC

## 2021-07-08 MED ORDER — NEBIVOLOL HCL 5 MG PO TABS: 5.0000 mg | ORAL_TABLET | Freq: Every day | ORAL | 3 refills | Status: DC

## 2021-07-08 MED ORDER — DENOSUMAB 60 MG/ML ~~LOC~~ SOSY
60.0000 mg | PREFILLED_SYRINGE | Freq: Once | SUBCUTANEOUS | Status: AC
  Administered 2021-07-08: 60 mg via SUBCUTANEOUS

## 2021-07-08 NOTE — Assessment & Plan Note (Signed)
Worse Cont on  Bystolic, Cardizem CD. Add Losartan HCT Altace d/c due to cough

## 2021-07-08 NOTE — Patient Instructions (Signed)
Blue-Emu cream -- use 2-3 times a day ? ?

## 2021-07-08 NOTE — Assessment & Plan Note (Signed)
Post-URI Tessalon prn Pt declined CXR

## 2021-07-08 NOTE — Assessment & Plan Note (Signed)
Recurrent - L eye and no HA MRI/MRA w/o acute CVA 06/2021

## 2021-07-08 NOTE — Assessment & Plan Note (Signed)
Cont w/Prolia. Given today

## 2021-07-08 NOTE — Progress Notes (Signed)
Subjective:  Patient ID: Selena Taylor, female    DOB: [birth year]  Age: 86 y.o. MRN: [id number]  CC: No chief complaint on file.   HPI Selena Taylor presents for a blurred vision  episode on 6/2 lasted 4 min. Pt went to ER.  Per Dr Tegeler: "Selena Taylor is a 86 y.o. female with a past medical history significant for hypertension, previous TIA, and atrial fibrillation on Xarelto who presents at the direction of her cardiologist for further evaluation of transient left vision changes.  According to patient, she was playing chess with her family at around 12:30 PM when she had approximately 5 minutes of left eye blurry vision.  She reports that it felt like it was "underwater" and very blurry throughout.  There was no vision change in the right eye reportedly.  She denies diplopia.  Denies any headache or neck pain.  Denies any temple pain.  Denies any nausea, vomiting, numbness, tingling, weakness of extremities.  Did not have facial droop or speech difficulties.  She simply reports the vision changes that went away causing her to call her cardiologist.  She does have that her blood pressure has been higher in the 180s but she denies any trauma, neck manipulation, chiropractor, or massage therapy of the neck recently.  Denies any other complaints.   MRI just returned without evidence of acute stroke.  Remote lacunar infarct seen.  MRA does not show LVO or acute obstruction."  F/u HTN - elevated BP at home, SBP 150s - she increased Bystolic to 5 mg/d  C/o cough after URI - better   Outpatient Medications Prior to Visit  Medication Sig Dispense Refill   Cholecalciferol (VITAMIN D3) 2000 units capsule Take 1 capsule (2,000 Units total) by mouth daily. 100 capsule 3   denosumab (PROLIA) 60 MG/ML SOSY injection Inject 60 mg into the skin every 6 (six) months.     diltiazem (CARDIZEM CD) 240 MG 24 hr capsule TAKE 1 CAPSULE(240 MG) BY MOUTH DAILY 90 capsule 3   hydrocortisone 2.5 % ointment Apply  topically 3 (three) times daily. 60 g 2   Rivaroxaban (XARELTO) 15 MG TABS tablet TAKE 1 TABLET(15 MG) BY MOUTH DAILY WITH SUPPER 90 tablet 1   zolpidem (AMBIEN) 10 MG tablet TAKE 1 TABLET BY MOUTH AT BEDTIME AS NEEDED FOR SLEEP 30 tablet 5   nebivolol (BYSTOLIC) 5 MG tablet Take 0.5 tablets (2.5 mg total) by mouth daily. 15 tablet 8   ramipril (ALTACE) 10 MG capsule TAKE 1 CAPSULE(10 MG) BY MOUTH DAILY 30 capsule 11   No facility-administered medications prior to visit.    ROS: Review of Systems  Constitutional:  Negative for activity change, appetite change, chills, fatigue and unexpected weight change.  HENT:  Negative for congestion, mouth sores and sinus pressure.   Eyes:  Negative for visual disturbance.  Respiratory:  Positive for cough. Negative for chest tightness.   Gastrointestinal:  Negative for abdominal pain and nausea.  Genitourinary:  Negative for difficulty urinating, frequency and vaginal pain.  Musculoskeletal:  Negative for back pain and gait problem.  Skin:  Negative for pallor and rash.  Neurological:  Negative for dizziness, tremors, weakness, numbness and headaches.  Psychiatric/Behavioral:  Negative for confusion and sleep disturbance.     Objective:  BP 140/90 (BP Location: Left Arm, Patient Position: Sitting, Cuff Size: Normal)   Pulse 84   Temp 98.2 F (36.8 C) (Oral)   Ht 5\' 2"  (1.575 m)   SpO2 97%  BMI 23.41 kg/m   BP Readings from Last 3 Encounters:  07/08/21 140/90  06/26/21 (!) 160/88  04/30/21 120/84    Wt Readings from Last 3 Encounters:  06/26/21 128 lb (58.1 kg)  04/30/21 128 lb (58.1 kg)  01/23/21 126 lb 9.6 oz (57.4 kg)    Physical Exam Constitutional:      General: She is not in acute distress.    Appearance: Normal appearance. She is well-developed.  HENT:     Head: Normocephalic.     Right Ear: External ear normal.     Left Ear: External ear normal.     Nose: Nose normal.  Eyes:     General:        Right eye: No  discharge.        Left eye: No discharge.     Conjunctiva/sclera: Conjunctivae normal.     Pupils: Pupils are equal, round, and reactive to light.  Neck:     Thyroid: No thyromegaly.     Vascular: No JVD.     Trachea: No tracheal deviation.  Cardiovascular:     Rate and Rhythm: Normal rate. Rhythm irregular.     Heart sounds: Normal heart sounds.  Pulmonary:     Effort: No respiratory distress.     Breath sounds: No stridor. No wheezing.  Abdominal:     General: Bowel sounds are normal. There is no distension.     Palpations: Abdomen is soft. There is no mass.     Tenderness: There is no abdominal tenderness. There is no guarding or rebound.  Musculoskeletal:        General: No tenderness.     Cervical back: Normal range of motion and neck supple. No rigidity.  Lymphadenopathy:     Cervical: No cervical adenopathy.  Skin:    Findings: No erythema or rash.  Neurological:     Mental Status: She is oriented to person, place, and time.     Cranial Nerves: No cranial nerve deficit.     Motor: No weakness or abnormal muscle tone.     Coordination: Coordination normal.     Gait: Gait normal.     Deep Tendon Reflexes: Reflexes normal.  Psychiatric:        Behavior: Behavior normal.        Thought Content: Thought content normal.        Judgment: Judgment normal.     A total time of 45 minutes was spent preparing to see the patient, reviewing tests, x-rays, MRI and other medical records.  Also, obtaining history and performing comprehensive physical exam.  Additionally, counseling the patient regarding the above listed issues.   Finally, documenting clinical information in the health records, coordination of care, educating the patient re occular migraine, ACE related cough, HTN.    Lab Results  Component Value Date   WBC 4.8 06/26/2021   HGB 14.7 06/26/2021   HCT 46.0 06/26/2021   PLT 192 06/26/2021   GLUCOSE 148 (H) 06/26/2021   CHOL 163 10/15/2014   TRIG 93.0 10/15/2014    HDL 79.80 10/15/2014   LDLCALC 65 10/15/2014   ALT 17 06/26/2021   AST 27 06/26/2021   NA 139 06/26/2021   K 4.2 06/26/2021   CL 107 06/26/2021   CREATININE 1.00 06/26/2021   BUN 18 06/26/2021   CO2 22 06/26/2021   TSH 1.23 09/03/2020   INR 1.1 06/26/2021   HGBA1C 6.5 01/28/2014    MR BRAIN WO CONTRAST  Result Date: 06/26/2021 CLINICAL  DATA:  Initial evaluation for acute TIA. EXAM: MRI HEAD WITHOUT CONTRAST MRA HEAD WITHOUT CONTRAST TECHNIQUE: Multiplanar, multi-echo pulse sequences of the brain and surrounding structures were acquired without intravenous contrast. Angiographic images of the Circle of Willis were acquired using MRA technique without intravenous contrast. COMPARISON:  Prior CT from earlier the same day. FINDINGS: MRI HEAD FINDINGS Brain: Generalized age-related cerebral atrophy with moderate chronic small vessel ischemic disease. Small remote lacunar infarct present at the right frontal corona radiata. No evidence for acute or subacute ischemia. No other areas of chronic cortical infarction. No acute or chronic intracranial blood products. No mass lesion, mass effect or midline shift. No hydrocephalus or extra-axial fluid collection. Pituitary gland suprasellar region within normal limits. Vascular: Major intracranial vascular flow voids are maintained. Skull and upper cervical spine: Cranial junction with normal limits. Bone marrow signal intensity normal. No scalp soft tissue abnormality. Sinuses/Orbits: Prior bilateral ocular lens replacement. Paranasal sinuses are largely clear. No mastoid effusion. Other: None. MRA HEAD FINDINGS Anterior circulation: Both internal carotid arteries patent to the termini without stenosis or other abnormality. A1 segments, anterior communicating artery complex common anterior cerebral arteries patent without stenosis. Normal in stenosis or occlusion. No proximal MCA branch occlusion. Distal MCA branches perfused and symmetric. Distal small vessel  atheromatous irregularity. Posterior circulation: Both vertebral arteries patent to the vertebrobasilar junction without stenosis. Left vertebral artery dominant. Left PICA patent. Right PICA not well seen. Basilar patent to its distal aspect without stenosis. Superior cerebral arteries patent bilaterally. Right PCA supplied via the basilar. Fetal type origin of the left PCA. Both PCAs patent to their distal aspects without stenosis. Anatomic variants: As above.  No aneurysm. IMPRESSION: MRI HEAD IMPRESSION: 1. No acute intracranial abnormality. 2. Generalized age-related cerebral atrophy with moderate chronic small vessel ischemic disease. 3. Small remote lacunar infarct involving the right frontal corona radiata. MRA HEAD IMPRESSION: 1. Negative intracranial MRA for large vessel occlusion. No hemodynamically significant or correctable stenosis. 2. Distal small vessel atheromatous irregularity. Electronically Signed   By: Jeannine Boga M.D.   On: 06/26/2021 19:18   MR ANGIO HEAD WO CONTRAST  Result Date: 06/26/2021 CLINICAL DATA:  Initial evaluation for acute TIA. EXAM: MRI HEAD WITHOUT CONTRAST MRA HEAD WITHOUT CONTRAST TECHNIQUE: Multiplanar, multi-echo pulse sequences of the brain and surrounding structures were acquired without intravenous contrast. Angiographic images of the Circle of Willis were acquired using MRA technique without intravenous contrast. COMPARISON:  Prior CT from earlier the same day. FINDINGS: MRI HEAD FINDINGS Brain: Generalized age-related cerebral atrophy with moderate chronic small vessel ischemic disease. Small remote lacunar infarct present at the right frontal corona radiata. No evidence for acute or subacute ischemia. No other areas of chronic cortical infarction. No acute or chronic intracranial blood products. No mass lesion, mass effect or midline shift. No hydrocephalus or extra-axial fluid collection. Pituitary gland suprasellar region within normal limits. Vascular:  Major intracranial vascular flow voids are maintained. Skull and upper cervical spine: Cranial junction with normal limits. Bone marrow signal intensity normal. No scalp soft tissue abnormality. Sinuses/Orbits: Prior bilateral ocular lens replacement. Paranasal sinuses are largely clear. No mastoid effusion. Other: None. MRA HEAD FINDINGS Anterior circulation: Both internal carotid arteries patent to the termini without stenosis or other abnormality. A1 segments, anterior communicating artery complex common anterior cerebral arteries patent without stenosis. Normal in stenosis or occlusion. No proximal MCA branch occlusion. Distal MCA branches perfused and symmetric. Distal small vessel atheromatous irregularity. Posterior circulation: Both vertebral arteries patent to the vertebrobasilar  junction without stenosis. Left vertebral artery dominant. Left PICA patent. Right PICA not well seen. Basilar patent to its distal aspect without stenosis. Superior cerebral arteries patent bilaterally. Right PCA supplied via the basilar. Fetal type origin of the left PCA. Both PCAs patent to their distal aspects without stenosis. Anatomic variants: As above.  No aneurysm. IMPRESSION: MRI HEAD IMPRESSION: 1. No acute intracranial abnormality. 2. Generalized age-related cerebral atrophy with moderate chronic small vessel ischemic disease. 3. Small remote lacunar infarct involving the right frontal corona radiata. MRA HEAD IMPRESSION: 1. Negative intracranial MRA for large vessel occlusion. No hemodynamically significant or correctable stenosis. 2. Distal small vessel atheromatous irregularity. Electronically Signed   By: Jeannine Boga M.D.   On: 06/26/2021 19:18   CT HEAD WO CONTRAST  Result Date: 06/26/2021 CLINICAL DATA:  Provided history: Transient ischemic attack. Additional history provided: Blurry vision in left eye lasting several minutes. EXAM: CT HEAD WITHOUT CONTRAST TECHNIQUE: Contiguous axial images were  obtained from the base of the skull through the vertex without intravenous contrast. RADIATION DOSE REDUCTION: This exam was performed according to the departmental dose-optimization program which includes automated exposure control, adjustment of the mA and/or kV according to patient size and/or use of iterative reconstruction technique. COMPARISON:  Report from head CT 09/17/1998 (images unavailable). FINDINGS: Brain: Mild-to-moderate generalized cerebral atrophy. Advanced patchy and confluent hypoattenuation within the cerebral white matter, nonspecific but compatible with chronic small vessel ischemic disease. 4 mm parenchymal calcification within the right frontal lobe periventricular white matter, likely benign (series 3, image 20). There is no acute intracranial hemorrhage. No demarcated cortical infarct. No extra-axial fluid collection. No evidence of an intracranial mass. No midline shift. Vascular: No hyperdense vessel. Atherosclerotic calcifications. Skull: No fracture or aggressive osseous lesion. Sinuses/Orbits: No mass or acute finding within the imaged orbits. Minimal mucosal thickening within the left frontal ethmoidal recess IMPRESSION: No evidence of acute intracranial abnormality. Advanced chronic small vessel ischemic changes within the cerebral white matter. Mild-to-moderate generalized cerebral atrophy. Electronically Signed   By: Kellie Simmering D.O.   On: 06/26/2021 16:35    Assessment & Plan:   Problem List Items Addressed This Visit     Cough    Post-URI Tessalon prn Pt declined CXR      HTN (hypertension)    Worse Cont on  Bystolic, Cardizem CD. Add Losartan HCT Altace d/c due to cough      Relevant Medications   losartan-hydrochlorothiazide (HYZAAR) 100-12.5 MG tablet   nebivolol (BYSTOLIC) 5 MG tablet   Ocular migraine    Recurrent - L eye and no HA MRI/MRA w/o acute CVA 06/2021      Relevant Medications   losartan-hydrochlorothiazide (HYZAAR) 100-12.5 MG tablet    nebivolol (BYSTOLIC) 5 MG tablet   Osteoporosis - Primary    Cont w/Prolia. Given today         Meds ordered this encounter  Medications   benzonatate (TESSALON) 200 MG capsule    Sig: Take 1 capsule (200 mg total) by mouth 3 (three) times daily as needed for cough.    Dispense:  30 capsule    Refill:  0   losartan-hydrochlorothiazide (HYZAAR) 100-12.5 MG tablet    Sig: Take 1 tablet by mouth daily.    Dispense:  90 tablet    Refill:  3   nebivolol (BYSTOLIC) 5 MG tablet    Sig: Take 1 tablet (5 mg total) by mouth daily.    Dispense:  90 tablet    Refill:  3  denosumab (PROLIA) injection 60 mg    Order Specific Question:   Patient is enrolled in [facility] program for this medication and I have provided a copy of the Prolia Medication Guide and Patient Brochure.    Answer:   No    Order Specific Question:   I have reviewed with the patient the information in the Prolia Medication Guide and Patient Counseling Chart including the serious risks of Prolia and symptoms of each risk.    Answer:   No    Order Specific Question:   I have advised the patient to seek medical attention if they have signs or symptoms of any of the serious risks.    Answer:   No      Follow-up: Return in about 3 months (around 10/08/2021) for a follow-up visit.  Walker Kehr, MD

## 2021-07-25 NOTE — Telephone Encounter (Signed)
Last Prolia inj 07/08/21 Next Prolia inj due 01/08/22  Prior Auth: APPROVED PA#: [id number] KEY: [id number] Valid: 12/01/20-12/01/21

## 2021-08-11 DIAGNOSIS — L821 Other seborrheic keratosis: Secondary | ICD-10-CM | POA: Diagnosis not present

## 2021-08-11 DIAGNOSIS — L720 Epidermal cyst: Secondary | ICD-10-CM | POA: Diagnosis not present

## 2021-08-11 DIAGNOSIS — Z85828 Personal history of other malignant neoplasm of skin: Secondary | ICD-10-CM | POA: Diagnosis not present

## 2021-08-11 DIAGNOSIS — L570 Actinic keratosis: Secondary | ICD-10-CM | POA: Diagnosis not present

## 2021-09-10 ENCOUNTER — Telehealth: Payer: Self-pay | Admitting: Internal Medicine

## 2021-09-10 NOTE — Telephone Encounter (Signed)
N/A unable to leave a message for patient to call back to schedule Medicare Annual Wellness Visit   Last AWV  09/12/20  Please schedule at anytime with [facility] Advisor if patient calls the office back.      Any questions, please call me at [phone]

## 2021-09-23 DIAGNOSIS — Z85828 Personal history of other malignant neoplasm of skin: Secondary | ICD-10-CM | POA: Diagnosis not present

## 2021-09-23 DIAGNOSIS — L570 Actinic keratosis: Secondary | ICD-10-CM | POA: Diagnosis not present

## 2021-09-23 DIAGNOSIS — L720 Epidermal cyst: Secondary | ICD-10-CM | POA: Diagnosis not present

## 2021-09-23 DIAGNOSIS — Z419 Encounter for procedure for purposes other than remedying health state, unspecified: Secondary | ICD-10-CM | POA: Diagnosis not present

## 2021-10-02 ENCOUNTER — Other Ambulatory Visit: Payer: Self-pay | Admitting: Internal Medicine

## 2021-10-05 ENCOUNTER — Telehealth: Payer: Self-pay | Admitting: Internal Medicine

## 2021-10-05 NOTE — Telephone Encounter (Signed)
VM for pt tp rtn my call to schedule AWV with [facility] call back # [phone]

## 2021-10-06 ENCOUNTER — Telehealth: Payer: Self-pay | Admitting: Internal Medicine

## 2021-10-06 NOTE — Telephone Encounter (Signed)
LVM for pt to rtn my call to schedule AWV with [facility] call back # [phone] 

## 2021-10-07 NOTE — Telephone Encounter (Signed)
Pt called concerning refill on her zolpidem.Marland KitchenRaechel Chute

## 2021-10-26 DIAGNOSIS — I788 Other diseases of capillaries: Secondary | ICD-10-CM | POA: Diagnosis not present

## 2021-10-26 DIAGNOSIS — L570 Actinic keratosis: Secondary | ICD-10-CM | POA: Diagnosis not present

## 2021-10-26 DIAGNOSIS — Z85828 Personal history of other malignant neoplasm of skin: Secondary | ICD-10-CM | POA: Diagnosis not present

## 2021-10-26 DIAGNOSIS — D692 Other nonthrombocytopenic purpura: Secondary | ICD-10-CM | POA: Diagnosis not present

## 2021-11-09 ENCOUNTER — Ambulatory Visit (INDEPENDENT_AMBULATORY_CARE_PROVIDER_SITE_OTHER): Payer: Medicare Other

## 2021-11-09 VITALS — BP 116/60 | HR 96 | Temp 97.5°F | Ht 62.0 in | Wt 129.0 lb

## 2021-11-09 DIAGNOSIS — Z23 Encounter for immunization: Secondary | ICD-10-CM | POA: Diagnosis not present

## 2021-11-09 DIAGNOSIS — Z0000 Encounter for general adult medical examination without abnormal findings: Secondary | ICD-10-CM

## 2021-11-09 NOTE — Patient Instructions (Addendum)
[name] , Thank you for taking time to come for your Medicare Wellness Visit. I appreciate your ongoing commitment to your health goals. Please review the following plan we discussed and let me know if I can assist you in the future.   These are the goals we discussed:  Goals      DIET - INCREASE WATER INTAKE        This is a list of the screening recommended for you and due dates:  Health Maintenance  Topic Date Due   Zoster (Shingles) Vaccine (1 of 2) Never done   COVID-19 Vaccine (3 - Pfizer risk series) 04/27/2019   Tetanus Vaccine  08/12/2019   Pneumonia Vaccine  Completed   Flu Shot  Completed   DEXA scan (bone density measurement)  Completed   HPV Vaccine  Aged Out    Advanced directives: Yes  Conditions/risks identified: Yes  Next appointment: Follow up in one year for your annual wellness visit.   Preventive Care 22 Years and Older, Female Preventive care refers to lifestyle choices and visits with your health care provider that can promote health and wellness. What does preventive care include? A yearly physical exam. This is also called an annual well check. Dental exams once or twice a year. Routine eye exams. Ask your health care provider how often you should have your eyes checked. Personal lifestyle choices, including: Daily care of your teeth and gums. Regular physical activity. Eating a healthy diet. Avoiding tobacco and drug use. Limiting alcohol use. Practicing safe sex. Taking low-dose aspirin every day. Taking vitamin and mineral supplements as recommended by your health care provider. What happens during an annual well check? The services and screenings done by your health care provider during your annual well check will depend on your age, overall health, lifestyle risk factors, and family history of disease. Counseling  Your health care provider may ask you questions about your: Alcohol use. Tobacco use. Drug use. Emotional well-being. Home  and relationship well-being. Sexual activity. Eating habits. History of falls. Memory and ability to understand (cognition). Work and work Astronomer. Reproductive health. Screening  You may have the following tests or measurements: Height, weight, and BMI. Blood pressure. Lipid and cholesterol levels. These may be checked every 5 years, or more frequently if you are over 36 years old. Skin check. Lung cancer screening. You may have this screening every year starting at age 57 if you have a 30-pack-year history of smoking and currently smoke or have quit within the past 15 years. Fecal occult blood test (FOBT) of the stool. You may have this test every year starting at age 84. Flexible sigmoidoscopy or colonoscopy. You may have a sigmoidoscopy every 5 years or a colonoscopy every 10 years starting at age 69. Hepatitis C blood test. Hepatitis B blood test. Sexually transmitted disease (STD) testing. Diabetes screening. This is done by checking your blood sugar (glucose) after you have not eaten for a while (fasting). You may have this done every 1-3 years. Bone density scan. This is done to screen for osteoporosis. You may have this done starting at age 73. Mammogram. This may be done every 1-2 years. Talk to your health care provider about how often you should have regular mammograms. Talk with your health care provider about your test results, treatment options, and if necessary, the need for more tests. Vaccines  Your health care provider may recommend certain vaccines, such as: Influenza vaccine. This is recommended every year. Tetanus, diphtheria, and acellular  pertussis (Tdap, Td) vaccine. You may need a Td booster every 10 years. Zoster vaccine. You may need this after age 63. Pneumococcal 13-valent conjugate (PCV13) vaccine. One dose is recommended after age 68. Pneumococcal polysaccharide (PPSV23) vaccine. One dose is recommended after age 64. Talk to your health care provider  about which screenings and vaccines you need and how often you need them. This information is not intended to replace advice given to you by your health care provider. Make sure you discuss any questions you have with your health care provider. Document Released: 02/07/2015 Document Revised: 10/01/2015 Document Reviewed: 11/12/2014 Elsevier Interactive Patient Education  2017 [place] Prevention in the Home Falls can cause injuries. They can happen to people of all ages. There are many things you can do to make your home safe and to help prevent falls. What can I do on the outside of my home? Regularly fix the edges of walkways and driveways and fix any cracks. Remove anything that might make you trip as you walk through a door, such as a raised step or threshold. Trim any bushes or trees on the path to your home. Use bright outdoor lighting. Clear any walking paths of anything that might make someone trip, such as rocks or tools. Regularly check to see if handrails are loose or broken. Make sure that both sides of any steps have handrails. Any raised decks and porches should have guardrails on the edges. Have any leaves, snow, or ice cleared regularly. Use sand or salt on walking paths during winter. Clean up any spills in your garage right away. This includes oil or grease spills. What can I do in the bathroom? Use night lights. Install grab bars by the toilet and in the tub and shower. Do not use towel bars as grab bars. Use non-skid mats or decals in the tub or shower. If you need to sit down in the shower, use a plastic, non-slip stool. Keep the floor dry. Clean up any water that spills on the floor as soon as it happens. Remove soap buildup in the tub or shower regularly. Attach bath mats securely with double-sided non-slip rug tape. Do not have throw rugs and other things on the floor that can make you trip. What can I do in the bedroom? Use night lights. Make sure  that you have a light by your bed that is easy to reach. Do not use any sheets or blankets that are too big for your bed. They should not hang down onto the floor. Have a firm chair that has side arms. You can use this for support while you get dressed. Do not have throw rugs and other things on the floor that can make you trip. What can I do in the kitchen? Clean up any spills right away. Avoid walking on wet floors. Keep items that you use a lot in easy-to-reach places. If you need to reach something above you, use a strong step stool that has a grab bar. Keep electrical cords out of the way. Do not use floor polish or wax that makes floors slippery. If you must use wax, use non-skid floor wax. Do not have throw rugs and other things on the floor that can make you trip. What can I do with my stairs? Do not leave any items on the stairs. Make sure that there are handrails on both sides of the stairs and use them. Fix handrails that are broken or loose. Make sure that handrails  are as long as the stairways. Check any carpeting to make sure that it is firmly attached to the stairs. Fix any carpet that is loose or worn. Avoid having throw rugs at the top or bottom of the stairs. If you do have throw rugs, attach them to the floor with carpet tape. Make sure that you have a light switch at the top of the stairs and the bottom of the stairs. If you do not have them, ask someone to add them for you. What else can I do to help prevent falls? Wear shoes that: Do not have high heels. Have rubber bottoms. Are comfortable and fit you well. Are closed at the toe. Do not wear sandals. If you use a stepladder: Make sure that it is fully opened. Do not climb a closed stepladder. Make sure that both sides of the stepladder are locked into place. Ask someone to hold it for you, if possible. Clearly mark and make sure that you can see: Any grab bars or handrails. First and last steps. Where the edge of  each step is. Use tools that help you move around (mobility aids) if they are needed. These include: Canes. Walkers. Scooters. Crutches. Turn on the lights when you go into a dark area. Replace any light bulbs as soon as they burn out. Set up your furniture so you have a clear path. Avoid moving your furniture around. If any of your floors are uneven, fix them. If there are any pets around you, be aware of where they are. Review your medicines with your doctor. Some medicines can make you feel dizzy. This can increase your chance of falling. Ask your doctor what other things that you can do to help prevent falls. This information is not intended to replace advice given to you by your health care provider. Make sure you discuss any questions you have with your health care provider. Document Released: 11/07/2008 Document Revised: 06/19/2015 Document Reviewed: 02/15/2014 Elsevier Interactive Patient Education  2017 [employer].

## 2021-11-09 NOTE — Progress Notes (Addendum)
Subjective:   Selena Taylor is a 86 y.o. female who presents for Medicare Annual (Subsequent) preventive examination.  Review of Systems     Cardiac Risk Factors include: advanced age (>76men, >73 women);family history of premature cardiovascular disease;hypertension     Objective:    Today's Vitals   11/09/21 0931  BP: 116/60  Pulse: 96  Temp: (!) 97.5 F (36.4 C)  SpO2: 98%  Weight: 129 lb (58.5 kg)  Height: 5\' 2"  (1.575 m)  PainSc: 0-No pain   Body mass index is 23.59 kg/m.     11/09/2021    9:54 AM 06/26/2021    3:55 PM 09/12/2020   10:56 AM 12/13/2018   11:07 AM 03/22/2018   11:13 AM 05/24/2016   11:32 AM 10/07/2014    9:55 AM  Advanced Directives  Does Patient Have a Medical Advance Directive? Yes No Yes Yes Yes Yes Yes  Type of Paramedic of [place];Living will  Living will;Healthcare Power of [place];Living will [place];Living will [place];Living will   Does patient want to make changes to medical advance directive?   No - Patient declined      Copy of [place] in Chart? No - copy requested  No - copy requested No - copy requested No - copy requested No - copy requested Yes  Would patient like information on creating a medical advance directive?  No - Patient declined         Current Medications (verified) Outpatient Encounter Medications as of 11/09/2021  Medication Sig   benzonatate (TESSALON) 200 MG capsule Take 1 capsule (200 mg total) by mouth 3 (three) times daily as needed for cough.   Cholecalciferol (VITAMIN D3) 2000 units capsule Take 1 capsule (2,000 Units total) by mouth daily.   denosumab (PROLIA) 60 MG/ML SOSY injection Inject 60 mg into the skin every 6 (six) months.   diltiazem (CARDIZEM CD) 240 MG 24 hr capsule TAKE 1 CAPSULE(240 MG) BY MOUTH DAILY   hydrocortisone 2.5 % ointment Apply topically 3 (three) times daily.    losartan-hydrochlorothiazide (HYZAAR) 100-12.5 MG tablet Take 1 tablet by mouth daily.   nebivolol (BYSTOLIC) 5 MG tablet Take 1 tablet (5 mg total) by mouth daily. (Patient taking differently: Take 5 mg by mouth daily. Take 1/2 tablet QD)   Rivaroxaban (XARELTO) 15 MG TABS tablet TAKE 1 TABLET(15 MG) BY MOUTH DAILY WITH SUPPER   zolpidem (AMBIEN) 10 MG tablet TAKE 1 TABLET(10 MG) BY MOUTH AT BEDTIME AS NEEDED FOR SLEEP   No facility-administered encounter medications on file as of 11/09/2021.    Allergies (verified) Other and Procaine hcl   History: Past Medical History:  Diagnosis Date   Anxiety    CHF (congestive heart failure) (HCC)    Chronic anticoagulation    on coumadin   Endometriosis    HTN (hypertension)    Osteopenia    PAF (paroxysmal atrial fibrillation) (HCC)    has been on Flecainide in the past. Stopped 02/10/11; Remains on coumadin anticoagulation; intol to Rythmol and failed DCCV; noted to be in AFlutter 5/13;  Echo 01/2011:  EF 55-60%, mild MR, mild LAE.  Myoview 6/12:  No ischemia, EF 74%   Pericardial effusion    Pericardial effusion 02/23/2012   Shortness of breath    TIA (transient ischemic attack)    Past Surgical History:  Procedure Laterality Date   APPENDECTOMY     CARDIOVERSION  01/14/2011  Procedure: CARDIOVERSION;  Surgeon: Deboraha Sprang, MD;  Location: [facility];  Service: Cardiovascular;  Laterality: N/A;  To be completed in Neuro OR 33 time slot 0830 12/20   CARDIOVERSION  05/27/2011   Procedure: CARDIOVERSION;  Surgeon: Deboraha Sprang, MD;  Location: [facility];  Service: Cardiovascular;  Laterality: N/A;   CATARACT EXTRACTION Bilateral    PERICARDIAL WINDOW  02/25/2012   Procedure: PERICARDIAL WINDOW;  Surgeon: Grace Isaac, MD;  Location: [facility];  Service: Thoracic;  Laterality: N/A;   POLYPECTOMY     skin cancer removal     TEE WITHOUT CARDIOVERSION  02/25/2012   Procedure: TRANSESOPHAGEAL ECHOCARDIOGRAM (TEE);  Surgeon: Grace Isaac, MD;   Location: [facility];  Service: Thoracic;  Laterality: N/A;   TOTAL HYSTERECTOMY AND BILATERAL SALPINGOOPHERECTOMY     Family History  Problem Relation Age of Onset   Cancer Brother        myeloma   Heart disease Other        Father age 20   Social History   Socioeconomic History   Marital status: Widowed    Spouse name: Not on file   Number of children: 1   Years of education: Not on file   Highest education level: Not on file  Occupational History   Occupation: retired  Tobacco Use   Smoking status: Never   Smokeless tobacco: Never  Vaping Use   Vaping Use: Never used  Substance and Sexual Activity   Alcohol use: Yes    Alcohol/week: 14.0 standard drinks of alcohol    Types: 14 Glasses of wine per week    Comment: 1-2 glasses of wine nightly   Drug use: No   Sexual activity: Not Currently  Other Topics Concern   Not on file  Social History Narrative   HSG; Became a stewardness..   Married - 1959.Marland Kitchen   1 son - '65; 1 daughter '60; 2 grandchildren..   Occupation: Retired..   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   End of life Care: no DNR, DNI, no futile or heroic measures.            Social Determinants of Health   Financial Resource Strain: Low Risk  (11/09/2021)   Overall Financial Resource Strain (CARDIA)    Difficulty of Paying Living Expenses: Not hard at all  Food Insecurity: No Food Insecurity (11/09/2021)   Hunger Vital Sign    Worried About Running Out of Food in the Last Year: Never true    Ran Out of Food in the Last Year: Never true  Transportation Needs: No Transportation Needs (11/09/2021)   PRAPARE - Hydrologist (Medical): No    Lack of Transportation (Non-Medical): No  Physical Activity: Sufficiently Active (11/09/2021)   Exercise Vital Sign    Days of Exercise per Week: 5 days    Minutes of Exercise per Session: 30 min  Stress: No Stress Concern Present (11/09/2021)   [place]    Feeling of Stress : Not at all  Social Connections: Moderately Integrated (11/09/2021)   Social Connection and Isolation Panel [NHANES]    Frequency of Communication with Friends and Family: More than three times a week    Frequency of Social Gatherings with Friends and Family: More than three times a week    Attends Religious Services: More than 4 times per year    Active Member of [employer] or Organizations: Yes  Attends Archivist Meetings: More than 4 times per year    Marital Status: Widowed    Tobacco Counseling Counseling given: Not Answered   Clinical Intake:  Pre-visit preparation completed: Yes  Pain : No/denies pain Pain Score: 0-No pain     BMI - recorded: 23.59 Nutritional Status: BMI of 19-24  Normal Nutritional Risks: None Diabetes: No  How often do you need to have someone help you when you read instructions, pamphlets, or other written materials from your doctor or pharmacy?: 1 - Never What is the last grade level you completed in school?: HSG  Diabetic? no  Interpreter Needed?: No  Information entered by :: Lisette Abu, LPN.   Activities of Daily Living    11/09/2021    9:35 AM  In your present state of health, do you have any difficulty performing the following activities:  Hearing? 0  Vision? 0  Difficulty concentrating or making decisions? 0  Walking or climbing stairs? 0  Dressing or bathing? 0  Doing errands, shopping? 0  Preparing Food and eating ? N  Using the Toilet? N  In the past six months, have you accidently leaked urine? N  Do you have problems with loss of bowel control? N  Managing your Medications? N  Managing your Finances? N  Housekeeping or managing your Housekeeping? N    Patient Care Team: Plotnikov, Evie Lacks, MD as PCP - General (Internal Medicine) Deboraha Sprang, MD as PCP - Cardiology (Cardiology) Deboraha Sprang, MD as PCP - Electrophysiology  (Cardiology) Deboraha Sprang, MD as Consulting Physician (Cardiology) Rolm Bookbinder, MD as Consulting Physician (Dermatology) Shon Hough, MD as Consulting Physician (Ophthalmology) Marygrace Drought, MD as Consulting Physician (Ophthalmology)  Indicate any recent Medical Services you may have received from other than [facility] providers in the past year (date may be approximate).     Assessment:   This is a routine wellness examination for Sherece.  Hearing/Vision screen Hearing Screening - Comments:: Denies hearing difficulties   Vision Screening - Comments:: Wears rx glasses - up to date with routine eye exams with Marygrace Drought, MD.   Dietary issues and exercise activities discussed: Current Exercise Habits: Home exercise routine, Type of exercise: walking, Time (Minutes): 30, Frequency (Times/Week): 5, Weekly Exercise (Minutes/Week): 150, Intensity: Moderate, Exercise limited by: orthopedic condition(s)   Goals Addressed             This Visit's Progress    DIET - INCREASE WATER INTAKE        Depression Screen    11/09/2021    9:32 AM 07/08/2021    4:16 PM 09/12/2020   10:55 AM 12/13/2018   11:17 AM 11/23/2017    1:46 PM 05/24/2016   11:33 AM 10/07/2014    9:58 AM  PHQ 2/9 Scores  PHQ - 2 Score 0 0 0 0 0 0 0    Fall Risk    11/09/2021    9:35 AM 07/08/2021    4:16 PM 09/12/2020   10:58 AM 12/13/2018   11:17 AM 11/23/2017    1:46 PM  Fall Risk   Falls in the past year? 0 0 0 0 No  Number falls in past yr: 0 0 0 0   Injury with Fall? 0 0 0 0   Risk for fall due to : No Fall Risks No Fall Risks No Fall Risks    Follow up Falls prevention discussed Falls evaluation completed Falls evaluation completed      FALL RISK  PREVENTION PERTAINING TO THE HOME:  Any stairs in or around the home? No  If so, are there any without handrails? No  Home free of loose throw rugs in walkways, pet beds, electrical cords, etc? Yes  Adequate lighting in your home to reduce risk of  falls? Yes   ASSISTIVE DEVICES UTILIZED TO PREVENT FALLS:  Life alert? No  Use of a cane, walker or w/c? No  Grab bars in the bathroom? Yes  Shower chair or bench in shower? No  Elevated toilet seat or a handicapped toilet? No   TIMED UP AND GO:  Was the test performed? Yes .  Length of time to ambulate 10 feet: 6 sec.   Gait steady and fast without use of assistive device  Cognitive Function:        11/09/2021    9:38 AM  6CIT Screen  What Year? 0 points  What month? 0 points  What time? 0 points  Count back from 20 0 points  Months in reverse 0 points  Repeat phrase 0 points  Total Score 0 points    Immunizations Immunization History  Administered Date(s) Administered   Fluad Quad(high Dose 65+) 10/23/2018, 11/16/2019, 11/10/2020   Influenza Split 11/18/2011   Influenza Whole 12/08/2007, 11/18/2008, 11/17/2009   Influenza, High Dose Seasonal PF 11/08/2012, 10/29/2013, 10/07/2014, 10/27/2015, 11/18/2016, 10/17/2017   PFIZER(Purple Top)SARS-COV-2 Vaccination 03/05/2019, 03/30/2019   Pneumococcal Conjugate-13 08/14/2014   Pneumococcal Polysaccharide-23 11/27/2002, 06/04/2016   Td 08/11/2009   Zoster, Live 02/18/2009    TDAP status: Due, Education has been provided regarding the importance of this vaccine. Advised may receive this vaccine at local pharmacy or Health Dept. Aware to provide a copy of the vaccination record if obtained from local pharmacy or Health Dept. Verbalized acceptance and understanding.  Flu Vaccine status: Completed at today's visit  Pneumococcal vaccine status: Up to date  Covid-19 vaccine status: Completed vaccines  Qualifies for Shingles Vaccine? Yes   Zostavax completed Yes   Shingrix Completed?: No.    Education has been provided regarding the importance of this vaccine. Patient has been advised to call insurance company to determine out of pocket expense if they have not yet received this vaccine. Advised may also receive vaccine at  local pharmacy or Health Dept. Verbalized acceptance and understanding.  Screening Tests Health Maintenance  Topic Date Due   Zoster Vaccines- Shingrix (1 of 2) Never done   COVID-19 Vaccine (3 - Pfizer risk series) 04/27/2019   TETANUS/TDAP  08/12/2019   INFLUENZA VACCINE  08/25/2021   Pneumonia Vaccine 57+ Years old  Completed   DEXA SCAN  Completed   HPV VACCINES  Aged Out    Health Maintenance  Health Maintenance Due  Topic Date Due   Zoster Vaccines- Shingrix (1 of 2) Never done   COVID-19 Vaccine (3 - Pfizer risk series) 04/27/2019   TETANUS/TDAP  08/12/2019   INFLUENZA VACCINE  08/25/2021    Colorectal cancer screening: No longer required.   Mammogram status: No longer required due to age.  Bone Density screening: Patient on Prolia  Lung Cancer Screening: (Low Dose CT Chest recommended if Age 62-80 years, 30 pack-year currently smoking OR have quit w/in 15years.) does not qualify.   Lung Cancer Screening Referral: no  Additional Screening:  Hepatitis C Screening: does not qualify; Completed no  Vision Screening: Recommended annual ophthalmology exams for early detection of glaucoma and other disorders of the eye. Is the patient up to date with their annual eye exam?  Yes  Who is the provider or what is the name of the office in which the patient attends annual eye exams? Marygrace Drought, MD. If pt is not established with a provider, would they like to be referred to a provider to establish care? No .   Dental Screening: Recommended annual dental exams for proper oral hygiene  Community Resource Referral / Chronic Care Management: CRR required this visit?  No   CCM required this visit?  No      Plan:     I have personally reviewed and noted the following in the patient's chart:   Medical and social history Use of alcohol, tobacco or illicit drugs  Current medications and supplements including opioid prescriptions. Patient is not currently taking opioid  prescriptions. Functional ability and status Nutritional status Physical activity Advanced directives List of other physicians Hospitalizations, surgeries, and ER visits in previous 12 months Vitals Screenings to include cognitive, depression, and falls Referrals and appointments  In addition, I have reviewed and discussed with patient certain preventive protocols, quality metrics, and best practice recommendations. A written personalized care plan for preventive services as well as general preventive health recommendations were provided to patient.     Sheral Flow, LPN   [id number]   Nurse Notes: N/A    Medical screening examination/treatment/procedure(s) were performed by non-physician practitioner and as supervising physician I was immediately available for consultation/collaboration.  I agree with above. Lew Dawes, MD

## 2021-11-26 ENCOUNTER — Encounter: Payer: Medicare Other | Admitting: Internal Medicine

## 2021-11-27 ENCOUNTER — Telehealth: Payer: Self-pay | Admitting: Internal Medicine

## 2021-11-27 ENCOUNTER — Other Ambulatory Visit (HOSPITAL_COMMUNITY): Payer: Self-pay

## 2021-11-27 DIAGNOSIS — I480 Paroxysmal atrial fibrillation: Secondary | ICD-10-CM

## 2021-11-27 MED ORDER — RIVAROXABAN 15 MG PO TABS: ORAL_TABLET | ORAL | 1 refills | Status: DC

## 2021-11-27 NOTE — Telephone Encounter (Signed)
Submitted a Prior Authorization request to [facility] Medicare Part D for PROLIA via CoverMyMeds. Will update once we receive a response.   Key: Selena Taylor

## 2021-11-27 NOTE — Telephone Encounter (Signed)
Prolia benefits verification submitted 

## 2021-11-27 NOTE — Telephone Encounter (Addendum)
Patient Advocate Encounter  Prior Authorization for Prolia 60MG /ML syringes has been approved.    KeyJunious Taylor Effective dates: 11/27/2021 through 11/28/2022  Pharmacy benefit copay: $389.89

## 2021-11-27 NOTE — Telephone Encounter (Signed)
[employer] called and stated that Selena Taylor was approved for her Prolia injections. It was approved for Nov 27, 2021 through Nov. 4, 2024.

## 2021-11-27 NOTE — Telephone Encounter (Signed)
*  STAT* If patient is at the pharmacy, call can be transferred to refill team.   1. Which medications need to be refilled? (please list name of each medication and dose if known) Rivaroxaban (XARELTO) 15 MG TABS tablet   2. Which pharmacy/location (including street and city if local pharmacy) is medication to be sent to?  [facility] [place], [address] AT [address]    3. Do they need a 30 day or 90 day supply? [place]

## 2021-11-30 NOTE — Telephone Encounter (Signed)
Pt ready for scheduling on or after 01/08/2022   Out-of-pocket cost due at time of visit: $276   Primary: [employer] [place] [employer] Prolia co-insurance: 20% (approximately $276) Admin fee co-insurance: $0   Secondary: n/a Prolia co-insurance:  Admin fee co-insurance:    Deductible: does not apply   Prior Auth: APPROVED PA#: CoverMyMeds KEY: [id number] Valid: 12/01/20-12/01/21    ** This summary of benefits is an estimation of the patient's out-of-pocket cost. Exact cost may vary based on individual plan coverage.

## 2021-12-02 NOTE — Telephone Encounter (Signed)
Updated PA-  [employer] called and stated that Selena Taylor was approved for her Prolia injections. It was approved for Nov 27, 2021 through Nov. 4, 2024.

## 2021-12-03 ENCOUNTER — Encounter: Payer: Self-pay | Admitting: Internal Medicine

## 2021-12-03 ENCOUNTER — Telehealth: Payer: Self-pay | Admitting: *Deleted

## 2021-12-03 ENCOUNTER — Ambulatory Visit (INDEPENDENT_AMBULATORY_CARE_PROVIDER_SITE_OTHER): Payer: Medicare Other | Admitting: Internal Medicine

## 2021-12-03 VITALS — BP 130/72 | HR 71 | Temp 98.0°F | Ht 62.0 in | Wt 130.6 lb

## 2021-12-03 DIAGNOSIS — I10 Essential (primary) hypertension: Secondary | ICD-10-CM | POA: Diagnosis not present

## 2021-12-03 DIAGNOSIS — R5382 Chronic fatigue, unspecified: Secondary | ICD-10-CM | POA: Diagnosis not present

## 2021-12-03 DIAGNOSIS — F5104 Psychophysiologic insomnia: Secondary | ICD-10-CM | POA: Diagnosis not present

## 2021-12-03 MED ORDER — HYDROXYZINE HCL 25 MG PO TABS: 25.0000 mg | ORAL_TABLET | Freq: Every evening | ORAL | 2 refills | Status: DC | PRN

## 2021-12-03 MED ORDER — ZOLPIDEM TARTRATE ER 12.5 MG PO TBCR: 12.5000 mg | EXTENDED_RELEASE_TABLET | Freq: Every evening | ORAL | 3 refills | Status: DC | PRN

## 2021-12-03 MED ORDER — HYDROXYZINE HCL 25 MG PO TABS: 25.0000 mg | ORAL_TABLET | Freq: Every evening | ORAL | 2 refills | Status: AC | PRN

## 2021-12-03 NOTE — Assessment & Plan Note (Signed)
Better w/insomnia Rx and on less Bystolic

## 2021-12-03 NOTE — Progress Notes (Signed)
Subjective:  Patient ID: Selena Taylor, female    DOB: 08-08-1935  Age: 86 y.o. MRN: [id number]  CC: Annual Exam and Medication Problem (Want to discuss Zolpidem)   HPI Selena Taylor presents for insomnia - Zolpidem is inconsistent - asking for a brand name - it is too $$$ F/u HTN  Outpatient Medications Prior to Visit  Medication Sig Dispense Refill   Cholecalciferol (VITAMIN D3) 2000 units capsule Take 1 capsule (2,000 Units total) by mouth daily. 100 capsule 3   denosumab (PROLIA) 60 MG/ML SOSY injection Inject 60 mg into the skin every 6 (six) months.     diltiazem (CARDIZEM CD) 240 MG 24 hr capsule TAKE 1 CAPSULE(240 MG) BY MOUTH DAILY 90 capsule 3   hydrocortisone 2.5 % ointment Apply topically 3 (three) times daily. 60 g 2   losartan-hydrochlorothiazide (HYZAAR) 100-12.5 MG tablet Take 1 tablet by mouth daily. 90 tablet 3   nebivolol (BYSTOLIC) 5 MG tablet Take 1 tablet (5 mg total) by mouth daily. (Patient taking differently: Take 5 mg by mouth daily. Take 1/2 tablet QD) 90 tablet 3   Rivaroxaban (XARELTO) 15 MG TABS tablet TAKE 1 TABLET(15 MG) BY MOUTH DAILY WITH SUPPER 90 tablet 1   zolpidem (AMBIEN) 10 MG tablet TAKE 1 TABLET(10 MG) BY MOUTH AT BEDTIME AS NEEDED FOR SLEEP 90 tablet 0   benzonatate (TESSALON) 200 MG capsule Take 1 capsule (200 mg total) by mouth 3 (three) times daily as needed for cough. (Patient not taking: Reported on 12/03/2021) 30 capsule 0   No facility-administered medications prior to visit.    ROS: Review of Systems  Constitutional:  Negative for activity change, appetite change, chills, fatigue and unexpected weight change.  HENT:  Negative for congestion, mouth sores and sinus pressure.   Eyes:  Negative for visual disturbance.  Respiratory:  Negative for cough and chest tightness.   Gastrointestinal:  Negative for abdominal pain and nausea.  Genitourinary:  Negative for difficulty urinating, frequency and vaginal pain.  Musculoskeletal:  Negative  for back pain and gait problem.  Skin:  Negative for pallor and rash.  Neurological:  Negative for dizziness, tremors, weakness, numbness and headaches.  Psychiatric/Behavioral:  Positive for sleep disturbance. Negative for confusion.     Objective:  BP 130/72 (BP Location: Left Arm)   Pulse 71   Temp 98 F (36.7 C) (Oral)   Ht 5\' 2"  (1.575 m)   Wt 130 lb 9.6 oz (59.2 kg)   SpO2 98%   BMI 23.89 kg/m   BP Readings from Last 3 Encounters:  12/03/21 130/72  11/09/21 116/60  07/08/21 140/90    Wt Readings from Last 3 Encounters:  12/03/21 130 lb 9.6 oz (59.2 kg)  11/09/21 129 lb (58.5 kg)  06/26/21 128 lb (58.1 kg)    Physical Exam Constitutional:      General: She is not in acute distress.    Appearance: She is well-developed.  HENT:     Head: Normocephalic.     Right Ear: External ear normal.     Left Ear: External ear normal.     Nose: Nose normal.  Eyes:     General:        Right eye: No discharge.        Left eye: No discharge.     Conjunctiva/sclera: Conjunctivae normal.     Pupils: Pupils are equal, round, and reactive to light.  Neck:     Thyroid: No thyromegaly.     Vascular:  No JVD.     Trachea: No tracheal deviation.  Cardiovascular:     Rate and Rhythm: Normal rate and regular rhythm.     Heart sounds: Normal heart sounds.  Pulmonary:     Effort: No respiratory distress.     Breath sounds: No stridor. No wheezing.  Abdominal:     General: Bowel sounds are normal. There is no distension.     Palpations: Abdomen is soft. There is no mass.     Tenderness: There is no abdominal tenderness. There is no guarding or rebound.  Musculoskeletal:        General: No tenderness.     Cervical back: Normal range of motion and neck supple. No rigidity.  Lymphadenopathy:     Cervical: No cervical adenopathy.  Skin:    Findings: No erythema or rash.  Neurological:     Cranial Nerves: No cranial nerve deficit.     Motor: No abnormal muscle tone.      Coordination: Coordination normal.     Deep Tendon Reflexes: Reflexes normal.  Psychiatric:        Behavior: Behavior normal.        Thought Content: Thought content normal.        Judgment: Judgment normal.     Lab Results  Component Value Date   WBC 4.8 06/26/2021   HGB 14.7 06/26/2021   HCT 46.0 06/26/2021   PLT 192 06/26/2021   GLUCOSE 148 (H) 06/26/2021   CHOL 163 10/15/2014   TRIG 93.0 10/15/2014   HDL 79.80 10/15/2014   LDLCALC 65 10/15/2014   ALT 17 06/26/2021   AST 27 06/26/2021   NA 139 06/26/2021   K 4.2 06/26/2021   CL 107 06/26/2021   CREATININE 1.00 06/26/2021   BUN 18 06/26/2021   CO2 22 06/26/2021   TSH 1.23 09/03/2020   INR 1.1 06/26/2021   HGBA1C 6.5 01/28/2014    MR BRAIN WO CONTRAST  Result Date: 06/26/2021 CLINICAL DATA:  Initial evaluation for acute TIA. EXAM: MRI HEAD WITHOUT CONTRAST MRA HEAD WITHOUT CONTRAST TECHNIQUE: Multiplanar, multi-echo pulse sequences of the brain and surrounding structures were acquired without intravenous contrast. Angiographic images of the Circle of Willis were acquired using MRA technique without intravenous contrast. COMPARISON:  Prior CT from earlier the same day. FINDINGS: MRI HEAD FINDINGS Brain: Generalized age-related cerebral atrophy with moderate chronic small vessel ischemic disease. Small remote lacunar infarct present at the right frontal corona radiata. No evidence for acute or subacute ischemia. No other areas of chronic cortical infarction. No acute or chronic intracranial blood products. No mass lesion, mass effect or midline shift. No hydrocephalus or extra-axial fluid collection. Pituitary gland suprasellar region within normal limits. Vascular: Major intracranial vascular flow voids are maintained. Skull and upper cervical spine: Cranial junction with normal limits. Bone marrow signal intensity normal. No scalp soft tissue abnormality. Sinuses/Orbits: Prior bilateral ocular lens replacement. Paranasal sinuses  are largely clear. No mastoid effusion. Other: None. MRA HEAD FINDINGS Anterior circulation: Both internal carotid arteries patent to the termini without stenosis or other abnormality. A1 segments, anterior communicating artery complex common anterior cerebral arteries patent without stenosis. Normal in stenosis or occlusion. No proximal MCA branch occlusion. Distal MCA branches perfused and symmetric. Distal small vessel atheromatous irregularity. Posterior circulation: Both vertebral arteries patent to the vertebrobasilar junction without stenosis. Left vertebral artery dominant. Left PICA patent. Right PICA not well seen. Basilar patent to its distal aspect without stenosis. Superior cerebral arteries patent bilaterally. Right PCA supplied  via the basilar. Fetal type origin of the left PCA. Both PCAs patent to their distal aspects without stenosis. Anatomic variants: As above.  No aneurysm. IMPRESSION: MRI HEAD IMPRESSION: 1. No acute intracranial abnormality. 2. Generalized age-related cerebral atrophy with moderate chronic small vessel ischemic disease. 3. Small remote lacunar infarct involving the right frontal corona radiata. MRA HEAD IMPRESSION: 1. Negative intracranial MRA for large vessel occlusion. No hemodynamically significant or correctable stenosis. 2. Distal small vessel atheromatous irregularity. Electronically Signed   By: Jeannine Boga M.D.   On: 06/26/2021 19:18   MR ANGIO HEAD WO CONTRAST  Result Date: 06/26/2021 CLINICAL DATA:  Initial evaluation for acute TIA. EXAM: MRI HEAD WITHOUT CONTRAST MRA HEAD WITHOUT CONTRAST TECHNIQUE: Multiplanar, multi-echo pulse sequences of the brain and surrounding structures were acquired without intravenous contrast. Angiographic images of the Circle of Willis were acquired using MRA technique without intravenous contrast. COMPARISON:  Prior CT from earlier the same day. FINDINGS: MRI HEAD FINDINGS Brain: Generalized age-related cerebral atrophy with  moderate chronic small vessel ischemic disease. Small remote lacunar infarct present at the right frontal corona radiata. No evidence for acute or subacute ischemia. No other areas of chronic cortical infarction. No acute or chronic intracranial blood products. No mass lesion, mass effect or midline shift. No hydrocephalus or extra-axial fluid collection. Pituitary gland suprasellar region within normal limits. Vascular: Major intracranial vascular flow voids are maintained. Skull and upper cervical spine: Cranial junction with normal limits. Bone marrow signal intensity normal. No scalp soft tissue abnormality. Sinuses/Orbits: Prior bilateral ocular lens replacement. Paranasal sinuses are largely clear. No mastoid effusion. Other: None. MRA HEAD FINDINGS Anterior circulation: Both internal carotid arteries patent to the termini without stenosis or other abnormality. A1 segments, anterior communicating artery complex common anterior cerebral arteries patent without stenosis. Normal in stenosis or occlusion. No proximal MCA branch occlusion. Distal MCA branches perfused and symmetric. Distal small vessel atheromatous irregularity. Posterior circulation: Both vertebral arteries patent to the vertebrobasilar junction without stenosis. Left vertebral artery dominant. Left PICA patent. Right PICA not well seen. Basilar patent to its distal aspect without stenosis. Superior cerebral arteries patent bilaterally. Right PCA supplied via the basilar. Fetal type origin of the left PCA. Both PCAs patent to their distal aspects without stenosis. Anatomic variants: As above.  No aneurysm. IMPRESSION: MRI HEAD IMPRESSION: 1. No acute intracranial abnormality. 2. Generalized age-related cerebral atrophy with moderate chronic small vessel ischemic disease. 3. Small remote lacunar infarct involving the right frontal corona radiata. MRA HEAD IMPRESSION: 1. Negative intracranial MRA for large vessel occlusion. No hemodynamically  significant or correctable stenosis. 2. Distal small vessel atheromatous irregularity. Electronically Signed   By: Jeannine Boga M.D.   On: 06/26/2021 19:18   CT HEAD WO CONTRAST  Result Date: 06/26/2021 CLINICAL DATA:  Provided history: Transient ischemic attack. Additional history provided: Blurry vision in left eye lasting several minutes. EXAM: CT HEAD WITHOUT CONTRAST TECHNIQUE: Contiguous axial images were obtained from the base of the skull through the vertex without intravenous contrast. RADIATION DOSE REDUCTION: This exam was performed according to the departmental dose-optimization program which includes automated exposure control, adjustment of the mA and/or kV according to patient size and/or use of iterative reconstruction technique. COMPARISON:  Report from head CT 09/17/1998 (images unavailable). FINDINGS: Brain: Mild-to-moderate generalized cerebral atrophy. Advanced patchy and confluent hypoattenuation within the cerebral white matter, nonspecific but compatible with chronic small vessel ischemic disease. 4 mm parenchymal calcification within the right frontal lobe periventricular white matter, likely benign (series  3, image 20). There is no acute intracranial hemorrhage. No demarcated cortical infarct. No extra-axial fluid collection. No evidence of an intracranial mass. No midline shift. Vascular: No hyperdense vessel. Atherosclerotic calcifications. Skull: No fracture or aggressive osseous lesion. Sinuses/Orbits: No mass or acute finding within the imaged orbits. Minimal mucosal thickening within the left frontal ethmoidal recess IMPRESSION: No evidence of acute intracranial abnormality. Advanced chronic small vessel ischemic changes within the cerebral white matter. Mild-to-moderate generalized cerebral atrophy. Electronically Signed   By: Jackey Loge D.O.   On: 06/26/2021 16:35    Assessment & Plan:   Problem List Items Addressed This Visit     Insomnia - Primary     Zolpidem  is inconsistent in it's effect - asking for a brand name - it is too $$$ Will try Zolpidem CR Given Hydroxyzine Rx prn to try without or with Zolpidem CR w/caution       HTN (hypertension)    Cont on Bystolic, Cardizem CD, Losartan HCT      Fatigue    Better w/insomnia Rx and on less Bystolic         Meds ordered this encounter  Medications   zolpidem (AMBIEN CR) 12.5 MG CR tablet    Sig: Take 1 tablet (12.5 mg total) by mouth at bedtime as needed for sleep.    Dispense:  30 tablet    Refill:  3   DISCONTD: hydrOXYzine (ATARAX) 25 MG tablet    Sig: Take 1-2 tablets (25-50 mg total) by mouth at bedtime as needed (sleep).    Dispense:  60 tablet    Refill:  2   hydrOXYzine (ATARAX) 25 MG tablet    Sig: Take 1-2 tablets (25-50 mg total) by mouth at bedtime as needed (sleep).    Dispense:  60 tablet    Refill:  2      Follow-up: Return in about 3 months (around 03/05/2022) for a follow-up visit.  Sonda Primes, MD

## 2021-12-03 NOTE — Assessment & Plan Note (Addendum)
Zolpidem is inconsistent in it's effect - asking for a brand name - it is too $$$ Will try Zolpidem CR Given Hydroxyzine Rx prn to try without or with Zolpidem CR w/caution

## 2021-12-03 NOTE — Telephone Encounter (Signed)
Pt is needing PA on Zolpidem CR submitted w/ [id number].. PA has been sent to insurance.Marland KitchenRaechel Chute

## 2021-12-03 NOTE — Assessment & Plan Note (Signed)
Cont on Bystolic, Cardizem CD, Losartan HCT 

## 2021-12-07 ENCOUNTER — Telehealth: Payer: Self-pay | Admitting: *Deleted

## 2021-12-07 NOTE — Telephone Encounter (Signed)
Rec'd fax pt needing PA on Hydroxyzine. Submitted w/ [id number] PA sent to [facility].Marland KitchenRaechel Chute

## 2021-12-07 NOTE — Telephone Encounter (Signed)
Rec'd determination med was APPROVED. Effective from 12/03/2021 through 12/04/2022. FAXED APPROVAL TO [facility],,/L[facility]

## 2021-12-07 NOTE — Telephone Encounter (Signed)
Rec'd determination back med was DENIED. Insurance not covering two meds for sleep../l,mb

## 2022-01-22 ENCOUNTER — Ambulatory Visit: Payer: Medicare Other | Attending: Internal Medicine | Admitting: Internal Medicine

## 2022-01-22 ENCOUNTER — Encounter: Payer: Self-pay | Admitting: Internal Medicine

## 2022-01-22 ENCOUNTER — Other Ambulatory Visit: Payer: Self-pay | Admitting: *Deleted

## 2022-01-22 VITALS — BP 124/70 | HR 101 | Ht 62.0 in | Wt 127.0 lb

## 2022-01-22 DIAGNOSIS — R001 Bradycardia, unspecified: Secondary | ICD-10-CM

## 2022-01-22 DIAGNOSIS — I480 Paroxysmal atrial fibrillation: Secondary | ICD-10-CM

## 2022-01-22 MED ORDER — DILTIAZEM HCL ER COATED BEADS 240 MG PO CP24: ORAL_CAPSULE | ORAL | 3 refills | Status: DC

## 2022-01-22 MED ORDER — NEBIVOLOL HCL 2.5 MG PO TABS: 2.5000 mg | ORAL_TABLET | Freq: Every day | ORAL | 3 refills | Status: DC

## 2022-01-22 MED ORDER — LOSARTAN POTASSIUM-HCTZ 50-12.5 MG PO TABS
1.0000 | ORAL_TABLET | Freq: Every day | ORAL | 3 refills | Status: DC
Start: 2022-01-22 — End: 2022-11-02

## 2022-01-22 NOTE — Progress Notes (Signed)
Patient ID: Selena Taylor, female   DOB: 1935-07-16, 86 y.o.   MRN: [id number]       Patient Care Team: Cassandria Anger, MD as PCP - General (Internal Medicine) Deboraha Sprang, MD as PCP - Cardiology (Cardiology) Deboraha Sprang, MD as PCP - Electrophysiology (Cardiology) Deboraha Sprang, MD as Consulting Physician (Cardiology) Rolm Bookbinder, MD as Consulting Physician (Dermatology) Shon Hough, MD as Consulting Physician (Ophthalmology) Marygrace Drought, MD as Consulting Physician (Ophthalmology)   HPI  Selena Taylor is a 86 y.o. female Seen in followup for atrial fibrillation and pericarditis. Because of persistent symptoms she had undergone a pericardial window.  She takes Rivaroxaban    Her husband died May 11, 2014. Her son has moved from [place].    On Anticoagulation; no bleeding.  Energy level is significantly improved with the discontinuation of Atarax and the addition of Ambien.  Also concurrent with this was a decrease in the nebivolol from 5--2.5.      The patient denies chest pain, shortness of breath, nocturnal dyspnea, orthopnea or peripheral edema.  There have been no palpitations, lightheadedness or syncope.     Date Cr K Hgb  6/17 0.89 4.6 13.9  5/18 1.02 4.6 13.8   7/20  1.03 4.9   7/21 0.93 4.5 13.4  3/22 1.1 4.7    8/22 0.99 4.2 12.9 (7/22)  6/23 1.0 4.2 14.7   DATE TEST EF%   1/13 Echo    1/14 Echo    7/15 Echo 50-55% LAE   4/22 Echo 55-60%     Past Medical History:  Diagnosis Date   Anxiety    CHF (congestive heart failure) (HCC)    Chronic anticoagulation    on coumadin   Endometriosis    HTN (hypertension)    Osteopenia    PAF (paroxysmal atrial fibrillation) ([place])    has been on Flecainide in the past. Stopped 02/10/11; Remains on coumadin anticoagulation; intol to Rythmol and failed DCCV; noted to be in AFlutter 5/13;  Echo 01/2011:  EF 55-60%, mild MR, mild LAE.  Myoview 6/12:  No ischemia, EF 74%   Pericardial effusion     Pericardial effusion 02/23/2012   Shortness of breath    TIA (transient ischemic attack)     Past Surgical History:  Procedure Laterality Date   APPENDECTOMY     CARDIOVERSION  01/14/2011   Procedure: CARDIOVERSION;  Surgeon: Deboraha Sprang, MD;  Location: [facility];  Service: Cardiovascular;  Laterality: N/A;  To be completed in Neuro OR 33 time slot 0830 12/20   CARDIOVERSION  05/27/2011   Procedure: CARDIOVERSION;  Surgeon: Deboraha Sprang, MD;  Location: [facility];  Service: Cardiovascular;  Laterality: N/A;   CATARACT EXTRACTION Bilateral    PERICARDIAL WINDOW  02/25/2012   Procedure: PERICARDIAL WINDOW;  Surgeon: Grace Isaac, MD;  Location: [facility];  Service: Thoracic;  Laterality: N/A;   POLYPECTOMY     skin cancer removal     TEE WITHOUT CARDIOVERSION  02/25/2012   Procedure: TRANSESOPHAGEAL ECHOCARDIOGRAM (TEE);  Surgeon: Grace Isaac, MD;  Location: [facility] OR;  Service: Thoracic;  Laterality: N/A;   TOTAL HYSTERECTOMY AND BILATERAL SALPINGOOPHERECTOMY      Current Outpatient Medications  Medication Sig Dispense Refill   Cholecalciferol (VITAMIN D3) 2000 units capsule Take 1 capsule (2,000 Units total) by mouth daily. 100 capsule 3   denosumab (PROLIA) 60 MG/ML SOSY injection Inject 60 mg into the skin every 6 (six) months.  diltiazem (CARDIZEM CD) 240 MG 24 hr capsule TAKE 1 CAPSULE(240 MG) BY MOUTH DAILY 90 capsule 3   losartan-hydrochlorothiazide (HYZAAR) 100-12.5 MG tablet Take 1 tablet by mouth daily. 90 tablet 3   nebivolol (BYSTOLIC) 5 MG tablet Take 1 tablet (5 mg total) by mouth daily. (Patient taking differently: Take 5 mg by mouth daily. Take 1/2 tablet QD) 90 tablet 3   Rivaroxaban (XARELTO) 15 MG TABS tablet TAKE 1 TABLET(15 MG) BY MOUTH DAILY WITH SUPPER 90 tablet 1   zolpidem (AMBIEN CR) 12.5 MG CR tablet Take 1 tablet (12.5 mg total) by mouth at bedtime as needed for sleep. 30 tablet 3   hydrocortisone 2.5 % ointment Apply topically 3 (three) times daily. (Patient  not taking: Reported on 01/22/2022) 60 g 2   hydrOXYzine (ATARAX) 25 MG tablet Take 1-2 tablets (25-50 mg total) by mouth at bedtime as needed (sleep). (Patient not taking: Reported on 01/22/2022) 60 tablet 2   No current facility-administered medications for this visit.    Allergies  Allergen Reactions   Other Other (See Comments)    Novocaine.  REACTION:  Rapid heart rate   Procaine Hcl     Rapid heart rate. The pt can tolerate Lidocaine OK.    Review of Systems negative except from HPI and PMH  Physical Exam: BP 124/70   Pulse (!) 101   Ht 5\' 2"  (1.575 m)   Wt 127 lb (57.6 kg)   SpO2 97%   BMI 23.23 kg/m  Well developed and nourished in no acute distress HENT normal Neck supple with JVP-flat Carotids brisk and full without bruits Clear Irregularly irregular rate and rhythm with rapid ventricular response, no murmurs or gallops Abd-soft with active BS without hepatomegaly No Clubbing cyanosis edema Skin-warm and dry A & Oriented  Grossly normal sensory and motor function   Assessment and  Plan  Atrial fibrillation permanent with a Controlled ventricular response   Bradycardia question chronotropic incompetence  Hypertension  Fatigue  Renal insufficiency Grade 3  Creatinine clearance varies by mechanism from 39 with a C-G calculator versus 55 with the CKD-EPI but has not calculations of the studies were made with the former, we will continue her Xarelto at 15 mg daily  No bleeding..  There is been some confusion as to her medications.  Per Dr. last note, he wanted her to continue losartan HCT, nebivolol and diltiazem.  She is alternating the losartan and the diltiazem; with her heart rate increased today, we will have her take her diltiazem regularly at 240, we will have her continue her nebivolol at 2.5 having half the dose because of fatigue, and have her cut her losartan HCT in half and take 50/6.25.  She is to see him again in a few weeks and he can make  further adjustments as he sees fit.

## 2022-01-22 NOTE — Patient Instructions (Signed)
Medication Instructions:  Your physician has recommended you make the following change in your medication:  1) Take your diltiazem 240 mg every day 2) DECREASE Losartan-HCTZ to 50-12.5 daily 3) DECREASE bystolic to 2.5 mg daily  *If you need a refill on your cardiac medications before your next appointment, please call your pharmacy*  Follow Up: At [facility], you and your health needs are our priority.  As part of our continuing mission to provide you with exceptional heart care, we have created designated Provider Care Teams.  These Care Teams include your primary Cardiologist (physician) and Advanced Practice Providers (APPs -  Physician Assistants and Nurse Practitioners) who all work together to provide you with the care you need, when you need it.  Your next appointment:   1 year(s)  The format for your next appointment:   In Person  Provider:   You may see Sherryl Manges, MD or one of the following Advanced Practice Providers on your designated Care Team:   Francis Dowse, [place] Casimiro Needle "Mardelle Matte" Lanna Poche, [place]    Important Information About Sugar

## 2022-01-26 ENCOUNTER — Ambulatory Visit: Payer: Medicare Other | Admitting: Internal Medicine

## 2022-01-26 ENCOUNTER — Telehealth: Payer: Self-pay | Admitting: Internal Medicine

## 2022-01-26 NOTE — Telephone Encounter (Signed)
Patient is calling requesting a callback from the nurse to go over medications and how to take them. Please advise.

## 2022-01-28 NOTE — Telephone Encounter (Signed)
Spoke with pt who states she found her AVS and has been able to review her new medications doses and has no further questions or concerns at this time.

## 2022-03-25 DIAGNOSIS — Z85828 Personal history of other malignant neoplasm of skin: Secondary | ICD-10-CM | POA: Diagnosis not present

## 2022-03-25 DIAGNOSIS — L820 Inflamed seborrheic keratosis: Secondary | ICD-10-CM | POA: Diagnosis not present

## 2022-03-25 DIAGNOSIS — D1801 Hemangioma of skin and subcutaneous tissue: Secondary | ICD-10-CM | POA: Diagnosis not present

## 2022-03-25 DIAGNOSIS — L821 Other seborrheic keratosis: Secondary | ICD-10-CM | POA: Diagnosis not present

## 2022-03-25 DIAGNOSIS — L718 Other rosacea: Secondary | ICD-10-CM | POA: Diagnosis not present

## 2022-03-31 ENCOUNTER — Telehealth: Payer: Self-pay | Admitting: Internal Medicine

## 2022-03-31 NOTE — Telephone Encounter (Signed)
Patient wants to know if she can be taken off of the Bystolic - she is gaining weight and does not want to stay on this because she feels this is the cause.  Patient's number:  [phone]

## 2022-04-01 ENCOUNTER — Telehealth: Payer: Self-pay | Admitting: Internal Medicine

## 2022-04-01 NOTE — Telephone Encounter (Signed)
Previous message was an error. Left message to call the clinic.

## 2022-04-01 NOTE — Telephone Encounter (Signed)
Pt c/o medication issue:  1. Name of Medication:   nebivolol (BYSTOLIC) 2.5 MG tablet  losartan-hydrochlorothiazide (HYZAAR) 50-12.5 MG tablet   2. How are you currently taking this medication (dosage and times per day)?  As prescribed  3. Are you having a reaction (difficulty breathing--STAT)?   No  4. What is your medication issue?   Patient states she wants to go off of these medications as she has had increased weight gain.  Patient wants to go back on diltiazem (CARDIZEM CD) 240 MG 24 hr capsule.

## 2022-04-01 NOTE — Telephone Encounter (Signed)
The patient has been notified of the result and verbalized understanding.  All questions (if any) were answered. Gershon Crane, LPN [phone] [id number] [phone]

## 2022-04-01 NOTE — Telephone Encounter (Signed)
Please Inform Patient We had made adjustments to make things better and we can readjust 1) what is her BP] 2) what is her HR 3) having symptoms of Afib  Thanks

## 2022-04-01 NOTE — Telephone Encounter (Signed)
Called patient back about her message. Patient stated that she has gained 10 pounds since December when Dr. Caryl Comes changed her medications. Dr. Caryl Comes decreased her Hyzaar from 100/12.5 mg to [id number] mg, and Bystolic from 5 mg to 2.5 mg. Patient stated she continues to take her diltiazem. Patient stated the only thing that has changed is her medications. Patient is tired of gaining weight. Patient is active and walks for exercise. Will send message to pharmacist and Dr. Caryl Comes for advisement.

## 2022-04-01 NOTE — Telephone Encounter (Signed)
Patient returned RN's call. 

## 2022-04-02 MED ORDER — NEBIVOLOL HCL 2.5 MG PO TABS: 2.5000 mg | ORAL_TABLET | Freq: Every day | ORAL | 0 refills | Status: DC

## 2022-04-02 NOTE — Telephone Encounter (Signed)
Called pt gave her MD response. Pt states its actually her Losartan/HCTZ that was rx by Dr. Caryl Comes. She will be seeing him and will go over it. She has no problem w/ Bystolic and is requesting refill. Inform pt will let MD know and sent rx [facility].Marland KitchenJohny Chess

## 2022-04-02 NOTE — Telephone Encounter (Signed)
Follow Up:    Patient was calling back.t

## 2022-04-02 NOTE — Telephone Encounter (Signed)
Okay to stop Bystolic.  Schedule follow-up appointment with me in a month.  Thank you

## 2022-04-02 NOTE — Telephone Encounter (Signed)
LVM for patient to return call to office.

## 2022-04-02 NOTE — Telephone Encounter (Signed)
Called pt no answer LMOM w/MD status will give her call soon as he address the msg../lmb

## 2022-04-02 NOTE — Telephone Encounter (Signed)
Patient state she feels great she just does not like the weight gain. Denies any Chest pain, shortness of breath, edema, headache or dizziness. Her blood pressure is 164/89, which she took while we were on the phone. HR 108. She did state that she took all her medications but feel her blood pressure and heart rate is up because she has been running around and rushing all morning. This is the first time she has checked her blood pressure and heart rate since last checked in the office. Please advise?

## 2022-04-02 NOTE — Telephone Encounter (Signed)
Patient is returning call. Requesting return call.  

## 2022-04-04 MED ORDER — NEBIVOLOL HCL 2.5 MG PO TABS
2.5000 mg | ORAL_TABLET | Freq: Every day | ORAL | 3 refills | Status: DC
Start: 2022-04-04 — End: 2022-04-05

## 2022-04-04 NOTE — Telephone Encounter (Signed)
Noted! Thank you

## 2022-04-05 ENCOUNTER — Telehealth: Payer: Self-pay | Admitting: Internal Medicine

## 2022-04-05 MED ORDER — ZOLPIDEM TARTRATE ER 12.5 MG PO TBCR
12.5000 mg | EXTENDED_RELEASE_TABLET | Freq: Every evening | ORAL | 3 refills | Status: DC | PRN
Start: 2022-04-05 — End: 2022-04-22

## 2022-04-05 MED ORDER — NEBIVOLOL HCL 2.5 MG PO TABS: 2.5000 mg | ORAL_TABLET | Freq: Every day | ORAL | 2 refills | Status: DC

## 2022-04-05 NOTE — Telephone Encounter (Signed)
Updated pharmacy pls resend../lmb 

## 2022-04-05 NOTE — Telephone Encounter (Signed)
Okay.  Thanks.

## 2022-04-05 NOTE — Telephone Encounter (Signed)
Reviewed instructions with pt who states understanding.  She will call back after 2 weeks with her BP readings.

## 2022-04-05 NOTE — Telephone Encounter (Signed)
Ask patient to monitor home blood pressure 1-2 times daily for the next 2 weeks.  She should take BP in the mornings, before meds and breakfast, ideally sitting with the cuff on her arm for 3-5 minutes before starting the device.  Repeat BP later that day in the evening, at least 30 min after any food.  Again sit for 3-5 minutes before starting test.  With this information we can better determine what home BP really is and make medication recommendations from that

## 2022-04-05 NOTE — Telephone Encounter (Signed)
Patient said her regular pharmacy is out of zolpidem (AMBIEN CR) 12.5 MG CR tablet. She would like for it to be sent to the [facility] on [address] in [place]. She said they told her they had 20 tablets.

## 2022-04-09 ENCOUNTER — Telehealth: Payer: Self-pay | Admitting: Internal Medicine

## 2022-04-09 NOTE — Telephone Encounter (Signed)
Patient would like for doctor Plotnikov to send a formulary form to [facility] so patient can have the original ambien filled.  Please fax to [facility]  Phone number for [employer]:  [phone] - customer service  [phone] - Provider LIne

## 2022-04-13 NOTE — Telephone Encounter (Signed)
It was done.  Thanks °

## 2022-04-22 ENCOUNTER — Ambulatory Visit (INDEPENDENT_AMBULATORY_CARE_PROVIDER_SITE_OTHER): Payer: Medicare Other | Admitting: Internal Medicine

## 2022-04-22 ENCOUNTER — Encounter: Payer: Self-pay | Admitting: Internal Medicine

## 2022-04-22 VITALS — BP 120/78 | HR 64 | Temp 98.6°F | Ht 62.0 in | Wt 130.0 lb

## 2022-04-22 DIAGNOSIS — G459 Transient cerebral ischemic attack, unspecified: Secondary | ICD-10-CM | POA: Diagnosis not present

## 2022-04-22 DIAGNOSIS — Z7901 Long term (current) use of anticoagulants: Secondary | ICD-10-CM | POA: Diagnosis not present

## 2022-04-22 DIAGNOSIS — R7309 Other abnormal glucose: Secondary | ICD-10-CM | POA: Diagnosis not present

## 2022-04-22 DIAGNOSIS — I10 Essential (primary) hypertension: Secondary | ICD-10-CM

## 2022-04-22 MED ORDER — ZOLPIDEM TARTRATE ER 12.5 MG PO TBCR: 12.5000 mg | EXTENDED_RELEASE_TABLET | Freq: Every evening | ORAL | 5 refills | Status: DC | PRN

## 2022-04-22 NOTE — Assessment & Plan Note (Signed)
Monitor CBG 

## 2022-04-22 NOTE — Assessment & Plan Note (Signed)
On Xarelto 

## 2022-04-22 NOTE — Progress Notes (Signed)
Subjective:  Patient ID: Selena Taylor, female    DOB: 26-Nov-1935  Age: 87 y.o. MRN: [id number]  CC: Medication Refill (Pt needs meds refilled)   HPI Selena Taylor presents for HTN, A fib, OA C/o HR 100 at times  Outpatient Medications Prior to Visit  Medication Sig Dispense Refill   Cholecalciferol (VITAMIN D3) 2000 units capsule Take 1 capsule (2,000 Units total) by mouth daily. 100 capsule 3   denosumab (PROLIA) 60 MG/ML SOSY injection Inject 60 mg into the skin every 6 (six) months.     diltiazem (CARDIZEM CD) 240 MG 24 hr capsule TAKE 1 CAPSULE(240 MG) BY MOUTH DAILY 90 capsule 3   FINACEA 15 % FOAM SMARTSIG:Sparingly Topical Daily     hydrOXYzine (ATARAX) 25 MG tablet Take 1-2 tablets (25-50 mg total) by mouth at bedtime as needed (sleep). 60 tablet 2   losartan-hydrochlorothiazide (HYZAAR) 50-12.5 MG tablet Take 1 tablet by mouth daily. 90 tablet 3   nebivolol (BYSTOLIC) 2.5 MG tablet Take 1 tablet (2.5 mg total) by mouth daily. 90 tablet 2   Rivaroxaban (XARELTO) 15 MG TABS tablet TAKE 1 TABLET(15 MG) BY MOUTH DAILY WITH SUPPER 90 tablet 1   zolpidem (AMBIEN CR) 12.5 MG CR tablet Take 1 tablet (12.5 mg total) by mouth at bedtime as needed for sleep. 30 tablet 3   hydrocortisone 2.5 % ointment Apply topically 3 (three) times daily. (Patient not taking: Reported on 01/22/2022) 60 g 2   No facility-administered medications prior to visit.    ROS: Review of Systems  Constitutional:  Negative for activity change, appetite change, chills, fatigue and unexpected weight change.  HENT:  Negative for congestion, mouth sores and sinus pressure.   Eyes:  Negative for visual disturbance.  Respiratory:  Negative for cough and chest tightness.   Cardiovascular:  Negative for palpitations and leg swelling.  Gastrointestinal:  Negative for abdominal pain and nausea.  Genitourinary:  Negative for difficulty urinating, frequency and vaginal pain.  Musculoskeletal:  Negative for back pain and  gait problem.  Skin:  Negative for pallor and rash.  Neurological:  Negative for dizziness, tremors, weakness, numbness and headaches.  Psychiatric/Behavioral:  Negative for confusion and sleep disturbance.     Objective:  BP 120/78 (BP Location: Right Arm, Patient Position: Sitting, Cuff Size: Normal)   Pulse 64   Temp 98.6 F (37 C) (Oral)   Ht 5\' 2"  (1.575 m)   Wt 130 lb (59 kg)   SpO2 98%   BMI 23.78 kg/m   BP Readings from Last 3 Encounters:  04/22/22 120/78  01/22/22 124/70  12/03/21 130/72    Wt Readings from Last 3 Encounters:  04/22/22 130 lb (59 kg)  01/22/22 127 lb (57.6 kg)  12/03/21 130 lb 9.6 oz (59.2 kg)    Physical Exam Constitutional:      General: She is not in acute distress.    Appearance: Normal appearance. She is well-developed.  HENT:     Head: Normocephalic.     Right Ear: External ear normal.     Left Ear: External ear normal.     Nose: Nose normal.  Eyes:     General:        Right eye: No discharge.        Left eye: No discharge.     Conjunctiva/sclera: Conjunctivae normal.     Pupils: Pupils are equal, round, and reactive to light.  Neck:     Thyroid: No thyromegaly.  Vascular: No JVD.     Trachea: No tracheal deviation.  Cardiovascular:     Rate and Rhythm: Normal rate and regular rhythm.     Heart sounds: Normal heart sounds.  Pulmonary:     Effort: No respiratory distress.     Breath sounds: No stridor. No wheezing.  Abdominal:     General: Bowel sounds are normal. There is no distension.     Palpations: Abdomen is soft. There is no mass.     Tenderness: There is no abdominal tenderness. There is no guarding or rebound.  Musculoskeletal:        General: No tenderness.     Cervical back: Normal range of motion and neck supple. No rigidity.  Lymphadenopathy:     Cervical: No cervical adenopathy.  Skin:    Findings: No erythema or rash.  Neurological:     Cranial Nerves: No cranial nerve deficit.     Motor: No abnormal  muscle tone.     Coordination: Coordination normal.     Deep Tendon Reflexes: Reflexes normal.  Psychiatric:        Behavior: Behavior normal.        Thought Content: Thought content normal.        Judgment: Judgment normal.     Lab Results  Component Value Date   WBC 4.8 06/26/2021   HGB 14.7 06/26/2021   HCT 46.0 06/26/2021   PLT 192 06/26/2021   GLUCOSE 148 (H) 06/26/2021   CHOL 163 10/15/2014   TRIG 93.0 10/15/2014   HDL 79.80 10/15/2014   LDLCALC 65 10/15/2014   ALT 17 06/26/2021   AST 27 06/26/2021   NA 139 06/26/2021   K 4.2 06/26/2021   CL 107 06/26/2021   CREATININE 1.00 06/26/2021   BUN 18 06/26/2021   CO2 22 06/26/2021   TSH 1.23 09/03/2020   INR 1.1 06/26/2021   HGBA1C 6.5 01/28/2014    MR BRAIN WO CONTRAST  Result Date: 06/26/2021 CLINICAL DATA:  Initial evaluation for acute TIA. EXAM: MRI HEAD WITHOUT CONTRAST MRA HEAD WITHOUT CONTRAST TECHNIQUE: Multiplanar, multi-echo pulse sequences of the brain and surrounding structures were acquired without intravenous contrast. Angiographic images of the Circle of Willis were acquired using MRA technique without intravenous contrast. COMPARISON:  Prior CT from earlier the same day. FINDINGS: MRI HEAD FINDINGS Brain: Generalized age-related cerebral atrophy with moderate chronic small vessel ischemic disease. Small remote lacunar infarct present at the right frontal corona radiata. No evidence for acute or subacute ischemia. No other areas of chronic cortical infarction. No acute or chronic intracranial blood products. No mass lesion, mass effect or midline shift. No hydrocephalus or extra-axial fluid collection. Pituitary gland suprasellar region within normal limits. Vascular: Major intracranial vascular flow voids are maintained. Skull and upper cervical spine: Cranial junction with normal limits. Bone marrow signal intensity normal. No scalp soft tissue abnormality. Sinuses/Orbits: Prior bilateral ocular lens replacement.  Paranasal sinuses are largely clear. No mastoid effusion. Other: None. MRA HEAD FINDINGS Anterior circulation: Both internal carotid arteries patent to the termini without stenosis or other abnormality. A1 segments, anterior communicating artery complex common anterior cerebral arteries patent without stenosis. Normal in stenosis or occlusion. No proximal MCA branch occlusion. Distal MCA branches perfused and symmetric. Distal small vessel atheromatous irregularity. Posterior circulation: Both vertebral arteries patent to the vertebrobasilar junction without stenosis. Left vertebral artery dominant. Left PICA patent. Right PICA not well seen. Basilar patent to its distal aspect without stenosis. Superior cerebral arteries patent bilaterally. Right PCA  supplied via the basilar. Fetal type origin of the left PCA. Both PCAs patent to their distal aspects without stenosis. Anatomic variants: As above.  No aneurysm. IMPRESSION: MRI HEAD IMPRESSION: 1. No acute intracranial abnormality. 2. Generalized age-related cerebral atrophy with moderate chronic small vessel ischemic disease. 3. Small remote lacunar infarct involving the right frontal corona radiata. MRA HEAD IMPRESSION: 1. Negative intracranial MRA for large vessel occlusion. No hemodynamically significant or correctable stenosis. 2. Distal small vessel atheromatous irregularity. Electronically Signed   By: Jeannine Boga M.D.   On: 06/26/2021 19:18   MR ANGIO HEAD WO CONTRAST  Result Date: 06/26/2021 CLINICAL DATA:  Initial evaluation for acute TIA. EXAM: MRI HEAD WITHOUT CONTRAST MRA HEAD WITHOUT CONTRAST TECHNIQUE: Multiplanar, multi-echo pulse sequences of the brain and surrounding structures were acquired without intravenous contrast. Angiographic images of the Circle of Willis were acquired using MRA technique without intravenous contrast. COMPARISON:  Prior CT from earlier the same day. FINDINGS: MRI HEAD FINDINGS Brain: Generalized age-related  cerebral atrophy with moderate chronic small vessel ischemic disease. Small remote lacunar infarct present at the right frontal corona radiata. No evidence for acute or subacute ischemia. No other areas of chronic cortical infarction. No acute or chronic intracranial blood products. No mass lesion, mass effect or midline shift. No hydrocephalus or extra-axial fluid collection. Pituitary gland suprasellar region within normal limits. Vascular: Major intracranial vascular flow voids are maintained. Skull and upper cervical spine: Cranial junction with normal limits. Bone marrow signal intensity normal. No scalp soft tissue abnormality. Sinuses/Orbits: Prior bilateral ocular lens replacement. Paranasal sinuses are largely clear. No mastoid effusion. Other: None. MRA HEAD FINDINGS Anterior circulation: Both internal carotid arteries patent to the termini without stenosis or other abnormality. A1 segments, anterior communicating artery complex common anterior cerebral arteries patent without stenosis. Normal in stenosis or occlusion. No proximal MCA branch occlusion. Distal MCA branches perfused and symmetric. Distal small vessel atheromatous irregularity. Posterior circulation: Both vertebral arteries patent to the vertebrobasilar junction without stenosis. Left vertebral artery dominant. Left PICA patent. Right PICA not well seen. Basilar patent to its distal aspect without stenosis. Superior cerebral arteries patent bilaterally. Right PCA supplied via the basilar. Fetal type origin of the left PCA. Both PCAs patent to their distal aspects without stenosis. Anatomic variants: As above.  No aneurysm. IMPRESSION: MRI HEAD IMPRESSION: 1. No acute intracranial abnormality. 2. Generalized age-related cerebral atrophy with moderate chronic small vessel ischemic disease. 3. Small remote lacunar infarct involving the right frontal corona radiata. MRA HEAD IMPRESSION: 1. Negative intracranial MRA for large vessel occlusion. No  hemodynamically significant or correctable stenosis. 2. Distal small vessel atheromatous irregularity. Electronically Signed   By: Jeannine Boga M.D.   On: 06/26/2021 19:18   CT HEAD WO CONTRAST  Result Date: 06/26/2021 CLINICAL DATA:  Provided history: Transient ischemic attack. Additional history provided: Blurry vision in left eye lasting several minutes. EXAM: CT HEAD WITHOUT CONTRAST TECHNIQUE: Contiguous axial images were obtained from the base of the skull through the vertex without intravenous contrast. RADIATION DOSE REDUCTION: This exam was performed according to the departmental dose-optimization program which includes automated exposure control, adjustment of the mA and/or kV according to patient size and/or use of iterative reconstruction technique. COMPARISON:  Report from head CT 09/17/1998 (images unavailable). FINDINGS: Brain: Mild-to-moderate generalized cerebral atrophy. Advanced patchy and confluent hypoattenuation within the cerebral white matter, nonspecific but compatible with chronic small vessel ischemic disease. 4 mm parenchymal calcification within the right frontal lobe periventricular white matter, likely benign (  series 3, image 20). There is no acute intracranial hemorrhage. No demarcated cortical infarct. No extra-axial fluid collection. No evidence of an intracranial mass. No midline shift. Vascular: No hyperdense vessel. Atherosclerotic calcifications. Skull: No fracture or aggressive osseous lesion. Sinuses/Orbits: No mass or acute finding within the imaged orbits. Minimal mucosal thickening within the left frontal ethmoidal recess IMPRESSION: No evidence of acute intracranial abnormality. Advanced chronic small vessel ischemic changes within the cerebral white matter. Mild-to-moderate generalized cerebral atrophy. Electronically Signed   By: Kellie Simmering D.O.   On: 06/26/2021 16:35    Assessment & Plan:   Problem List Items Addressed This Visit        Cardiovascular and Mediastinum   HTN (hypertension) - Primary    Cont on Bystolic, Cardizem CD, Losartan HCT      TIA (transient ischemic attack)    Cont on Bystolic, Cardizem CD, Losartan HCT Cont on Bystolic, Cardizem CD, Losartan HCT        Other   Chronic anticoagulation    On Xarelto      Elevated glucose    Monitor CBG         Meds ordered this encounter  Medications   zolpidem (AMBIEN CR) 12.5 MG CR tablet    Sig: Take 1 tablet (12.5 mg total) by mouth at bedtime as needed for sleep.    Dispense:  30 tablet    Refill:  5      Follow-up: Return in about 6 months (around 10/23/2022) for Wellness Exam.  Walker Kehr, MD

## 2022-04-22 NOTE — Assessment & Plan Note (Signed)
Cont on Bystolic, Cardizem CD, Losartan HCT Cont on Bystolic, Cardizem CD, Losartan HCT

## 2022-04-22 NOTE — Assessment & Plan Note (Signed)
Cont on Bystolic, Cardizem CD, Losartan HCT

## 2022-05-27 ENCOUNTER — Other Ambulatory Visit: Payer: Self-pay | Admitting: Internal Medicine

## 2022-05-27 NOTE — Telephone Encounter (Signed)
Patient said to cancel this - she found her handwritten rx

## 2022-06-24 ENCOUNTER — Telehealth: Payer: Self-pay | Admitting: Internal Medicine

## 2022-06-24 NOTE — Telephone Encounter (Signed)
Patient called and has questions about her medication. She would like to be called after 3:00 p.m. today.  RE:  Zolpidem  Number:  [phone]

## 2022-06-24 NOTE — Telephone Encounter (Signed)
Called pt she states pharmacy is out of Zolpidem 12.5 mg.. the manufacturer is out with no release date. Requesting MD to rx something else.Marland KitchenRaechel Chute

## 2022-06-25 NOTE — Telephone Encounter (Signed)
Pt return call back gave her Dr. Lawana Chambers response. Pt want to wait on Dr. Macario Golds for change.Marland KitchenRaechel Chute

## 2022-06-25 NOTE — Telephone Encounter (Signed)
Called pt no answer LMOM RTC.../lmb 

## 2022-06-25 NOTE — Telephone Encounter (Signed)
This is controlled and without PCP in office should not be changed outside of a visit. Recommend acute with anyone for alternative or patient can call other pharmacies and transfer rx

## 2022-06-29 NOTE — Telephone Encounter (Signed)
We can go back to using a regular zolpidem 10 mg at at bedtime.  Let me know.  Thanks

## 2022-06-29 NOTE — Telephone Encounter (Signed)
Pt states that she does not want to go back to the 10 mg, she would like a different medication that is equal strength to the 12.5 mg.

## 2022-06-30 ENCOUNTER — Other Ambulatory Visit: Payer: Self-pay | Admitting: Internal Medicine

## 2022-06-30 DIAGNOSIS — H524 Presbyopia: Secondary | ICD-10-CM | POA: Diagnosis not present

## 2022-06-30 DIAGNOSIS — I480 Paroxysmal atrial fibrillation: Secondary | ICD-10-CM

## 2022-06-30 MED ORDER — ZALEPLON 10 MG PO CAPS: 10.0000 mg | ORAL_CAPSULE | Freq: Every evening | ORAL | 5 refills | Status: DC | PRN

## 2022-06-30 NOTE — Telephone Encounter (Signed)
OK Sonata Thx

## 2022-06-30 NOTE — Telephone Encounter (Signed)
Called pt no answer LMOM MD sent rx ro pof.Marland KitchenRaechel Taylor

## 2022-06-30 NOTE — Telephone Encounter (Signed)
Prescription refill request for Xarelto received.  Indication:  Afib Last office visit: 01/22/22 Graciela Husbands)  Weight: 59kg Age: 87 Scr: 1.00 (06/26/21)  CrCl: 39.56ml/min  Labs overdue. Called pt and made her aware. Pt stated she can go to [facility] next week on Thursday, 07/08/22. Lab orders placed and lab appt scheduled. Pt verbalized understanding. Refill sent to prevent any missed doses.

## 2022-06-30 NOTE — Telephone Encounter (Signed)
Patient called stating that just a 10 mg. Of this med was sent in.  She had asked for 12.5 mg.  Please call patient and advise:  [phone]

## 2022-07-01 ENCOUNTER — Telehealth: Payer: Self-pay | Admitting: Internal Medicine

## 2022-07-01 NOTE — Telephone Encounter (Signed)
Called pt to get clarification per msg on 06/25/22 pt states pharmacy  was out of the Zolpidem 12.5 mg. When MD return on 06/29/22 he stated she can go back on the Zolpidem 10 mg. Pt then called back and states she did not want the Zolpidem 10 she wanted something else in it place. So MD rx Sonata 10 mg. Pt did not answer LMPM RTC.Marland KitchenRaechel Chute

## 2022-07-01 NOTE — Telephone Encounter (Signed)
Patient called and said that the pharmacy has to talk to you so she can get her 12.5 ambien since she already picked up the 10mg .  Please Call [facility] on [address].  Phone:  [phone]

## 2022-07-02 NOTE — Telephone Encounter (Signed)
Pt has called and stated the pharmacy is needing the MD okay to fill the rx for 12.5 ambien since she already picked up the 10mg  of Sanata. Pt states the 10mg  is not working and she has nt been able to sleep and that she has a heart condition that it is important she sleeps due to this.

## 2022-07-02 NOTE — Telephone Encounter (Signed)
This medication Sonata does not come and 12.5 milligram dose.  It is a different medication.  Thanks

## 2022-07-05 NOTE — Telephone Encounter (Signed)
Okay.  Please return unused Sonata to the pharmacy or bring to the office.  Thank you

## 2022-07-08 ENCOUNTER — Ambulatory Visit: Payer: Medicare Other | Attending: Internal Medicine

## 2022-07-08 DIAGNOSIS — I480 Paroxysmal atrial fibrillation: Secondary | ICD-10-CM

## 2022-07-09 LAB — CBC
Hematocrit: 42.8 % (ref 34.0–46.6)
Hemoglobin: 14.2 g/dL (ref 11.1–15.9)
MCH: 32.3 pg (ref 26.6–33.0)
MCHC: 33.2 g/dL (ref 31.5–35.7)
MCV: 98 fL — ABNORMAL HIGH (ref 79–97)
Platelets: 199 10*3/uL (ref 150–450)
RBC: 4.39 x10E6/uL (ref 3.77–5.28)
RDW: 12.4 % (ref 11.7–15.4)
WBC: 4.6 10*3/uL (ref 3.4–10.8)

## 2022-07-09 LAB — BASIC METABOLIC PANEL
BUN/Creatinine Ratio: 29 — ABNORMAL HIGH (ref 12–28)
BUN: 27 mg/dL (ref 8–27)
CO2: 22 mmol/L (ref 20–29)
Calcium: 9.4 mg/dL (ref 8.7–10.3)
Chloride: 102 mmol/L (ref 96–106)
Creatinine, Ser: 0.92 mg/dL (ref 0.57–1.00)
Glucose: 94 mg/dL (ref 70–99)
Potassium: 4.2 mmol/L (ref 3.5–5.2)
Sodium: 139 mmol/L (ref 134–144)
eGFR: 61 mL/min/{1.73_m2} (ref >59)

## 2022-08-11 DIAGNOSIS — L820 Inflamed seborrheic keratosis: Secondary | ICD-10-CM | POA: Diagnosis not present

## 2022-10-04 ENCOUNTER — Other Ambulatory Visit: Payer: Self-pay | Admitting: Internal Medicine

## 2022-10-04 DIAGNOSIS — I480 Paroxysmal atrial fibrillation: Secondary | ICD-10-CM

## 2022-10-04 NOTE — Telephone Encounter (Signed)
Prescription refill request for Xarelto received.  Indication: Afib  Last office visit: 01/23/23 Graciela Husbands)  Weight: 59kg Age: 87 Scr: 0.92 (07/08/22)  CrCl: 42.14ml/min  Appropriate dose. Refill sent.

## 2022-10-14 DIAGNOSIS — L820 Inflamed seborrheic keratosis: Secondary | ICD-10-CM | POA: Diagnosis not present

## 2022-10-14 DIAGNOSIS — Z85820 Personal history of malignant melanoma of skin: Secondary | ICD-10-CM | POA: Diagnosis not present

## 2022-10-14 DIAGNOSIS — L821 Other seborrheic keratosis: Secondary | ICD-10-CM | POA: Diagnosis not present

## 2022-10-14 DIAGNOSIS — L570 Actinic keratosis: Secondary | ICD-10-CM | POA: Diagnosis not present

## 2022-10-14 DIAGNOSIS — Z85828 Personal history of other malignant neoplasm of skin: Secondary | ICD-10-CM | POA: Diagnosis not present

## 2022-10-14 DIAGNOSIS — L853 Xerosis cutis: Secondary | ICD-10-CM | POA: Diagnosis not present

## 2022-10-18 ENCOUNTER — Telehealth: Payer: Self-pay | Admitting: Internal Medicine

## 2022-10-18 NOTE — Telephone Encounter (Signed)
Pt c/o medication issue:  1. Name of Medication:   losartan-hydrochlorothiazide (HYZAAR) 50-12.5 MG tablet    2. How are you currently taking this medication (dosage and times per day)?   Take 1 tablet by mouth daily.    3. Are you having a reaction (difficulty breathing--STAT)? No  4. What is your medication issue? Pt states that medication causes her to urinate all day. She would like to know if she is able to take just the Losartan without the HTZ. Please advise

## 2022-10-19 NOTE — Telephone Encounter (Signed)
Spoke with pt who reports she has not taken Losratan-hydrochlorothiazide in months because of the increase in urination.  She does not take her BP regularly.  She states she feels as if she has been going in and out of Afib more often but this is not unusual for her.  She is taking her other medications as prescribed.   Pt advised to check her BP and HR daily for the next 5 days and contact the office with those readings.  Pt denies current CP, SOB or dizziness.  Reviewed ED precautions for Afib and RN will attempt to secure an earlier appointment with EP or Afib clinic.  Pt verbalizes understanding and thanked Char[employer] fundraiser for the call.

## 2022-10-19 NOTE — Telephone Encounter (Signed)
Attempted phone call to pt.  Left voicemail message to contact office at [phone]. ?

## 2022-10-19 NOTE — Telephone Encounter (Signed)
Pt returning call

## 2022-10-25 ENCOUNTER — Telehealth: Payer: Self-pay | Admitting: Internal Medicine

## 2022-10-25 NOTE — Telephone Encounter (Signed)
Patient called to report her BP readings as requested.  Patient stated she is trying to get off of her Losartan medication.  142/91  HR 100  9/24 148/87  HR 83  9/25 152/83  HR 98  9/26 136/83  HR 89  6/27 136/93  HR 94  9/28

## 2022-10-26 NOTE — Telephone Encounter (Signed)
Attempted phone call to pt.  OK per Epic to leave detailed voicemail message.  Pt advised BP readings received and encouraged pt to contact PCP to address blood pressures since pt does not want to and not been taking Losartan hydrochlorothiazide due to increased urination.  Will contact pt with f/u appointment with Dr Graciela Husbands or APP as soon she is scheduled.

## 2022-10-26 NOTE — Telephone Encounter (Signed)
Spoke with pt and advised appointment scheduled with Francis Dowse, PA-C on 11/04/2022 at 245pm.  Pt states she will f/u with her PCP for BP.  Pt verbalizes understanding and thanked Charity fundraiser for the call.

## 2022-11-02 ENCOUNTER — Encounter: Payer: Self-pay | Admitting: Internal Medicine

## 2022-11-02 ENCOUNTER — Ambulatory Visit (INDEPENDENT_AMBULATORY_CARE_PROVIDER_SITE_OTHER): Payer: Medicare Other | Admitting: Internal Medicine

## 2022-11-02 VITALS — BP 130/88 | HR 113 | Temp 97.7°F | Ht 62.0 in | Wt 115.0 lb

## 2022-11-02 DIAGNOSIS — I10 Essential (primary) hypertension: Secondary | ICD-10-CM | POA: Diagnosis not present

## 2022-11-02 DIAGNOSIS — I480 Paroxysmal atrial fibrillation: Secondary | ICD-10-CM | POA: Diagnosis not present

## 2022-11-02 DIAGNOSIS — G8929 Other chronic pain: Secondary | ICD-10-CM

## 2022-11-02 DIAGNOSIS — F5104 Psychophysiologic insomnia: Secondary | ICD-10-CM

## 2022-11-02 DIAGNOSIS — M545 Low back pain, unspecified: Secondary | ICD-10-CM | POA: Diagnosis not present

## 2022-11-02 MED ORDER — ZOLPIDEM TARTRATE ER 12.5 MG PO TBCR
12.5000 mg | EXTENDED_RELEASE_TABLET | Freq: Every evening | ORAL | 1 refills | Status: DC | PRN
Start: 2022-11-02 — End: 2023-04-07

## 2022-11-02 MED ORDER — LOSARTAN POTASSIUM 50 MG PO TABS: 50.0000 mg | ORAL_TABLET | Freq: Every day | ORAL | 3 refills | Status: DC

## 2022-11-02 MED ORDER — TRAMADOL HCL 50 MG PO TABS: 50.0000 mg | ORAL_TABLET | Freq: Three times a day (TID) | ORAL | 1 refills | Status: AC | PRN

## 2022-11-02 NOTE — Assessment & Plan Note (Signed)
?  Cont on Xarelto, Bystolic, Cartia ?

## 2022-11-02 NOTE — Progress Notes (Signed)
Subjective:  Patient ID: Selena Taylor, female    DOB: 1935-02-15  Age: 87 y.o. MRN: [id number]  CC: Hypertension (Discuss medications and elevated BP)   HPI CHEREEN BARRECA presents for HTN. Nakyla stopped Losartan HCT 4 mo ago due to constant urination... SBP 136-142 C/o LBP - worse, Tramadol F/u A fib, insomnia   Outpatient Medications Prior to Visit  Medication Sig Dispense Refill   Cholecalciferol (VITAMIN D3) 2000 units capsule Take 1 capsule (2,000 Units total) by mouth daily. 100 capsule 3   denosumab (PROLIA) 60 MG/ML SOSY injection Inject 60 mg into the skin every 6 (six) months.     diltiazem (CARDIZEM CD) 240 MG 24 hr capsule TAKE 1 CAPSULE(240 MG) BY MOUTH DAILY 90 capsule 3   FINACEA 15 % FOAM SMARTSIG:Sparingly Topical Daily     hydrOXYzine (ATARAX) 25 MG tablet Take 1-2 tablets (25-50 mg total) by mouth at bedtime as needed (sleep). 60 tablet 2   nebivolol (BYSTOLIC) 2.5 MG tablet Take 1 tablet (2.5 mg total) by mouth daily. 90 tablet 2   Rivaroxaban (XARELTO) 15 MG TABS tablet TAKE 1 TABLET(15 MG) BY MOUTH DAILY WITH SUPPER. 90 tablet 1   losartan-hydrochlorothiazide (HYZAAR) 50-12.5 MG tablet Take 1 tablet by mouth daily. 90 tablet 3   zaleplon (SONATA) 10 MG capsule Take 1 capsule (10 mg total) by mouth at bedtime as needed for sleep. 30 capsule 5   zolpidem (AMBIEN CR) 12.5 MG CR tablet TAKE 1 TABLET BY MOUTH AT BEDTIME AS NEEDED FOR SLEEP 100 tablet 1   No facility-administered medications prior to visit.    ROS: Review of Systems  Constitutional:  Positive for fatigue. Negative for activity change, appetite change, chills and unexpected weight change.  HENT:  Negative for congestion, mouth sores and sinus pressure.   Eyes:  Negative for visual disturbance.  Respiratory:  Negative for cough and chest tightness.   Gastrointestinal:  Negative for abdominal pain and nausea.  Genitourinary:  Negative for difficulty urinating, frequency and vaginal pain.   Musculoskeletal:  Positive for back pain. Negative for gait problem.  Skin:  Negative for pallor and rash.  Neurological:  Negative for dizziness, tremors, weakness, numbness and headaches.  Psychiatric/Behavioral:  Negative for confusion and sleep disturbance.     Objective:  BP 130/88 (BP Location: Left Arm, Patient Position: Sitting, Cuff Size: Normal)   Pulse (!) 113   Temp 97.7 F (36.5 C) (Oral)   Ht 5\' 2"  (1.575 m)   Wt 115 lb (52.2 kg)   SpO2 98%   BMI 21.03 kg/m   BP Readings from Last 3 Encounters:  11/02/22 130/88  04/22/22 120/78  01/22/22 124/70    Wt Readings from Last 3 Encounters:  11/02/22 115 lb (52.2 kg)  04/22/22 130 lb (59 kg)  01/22/22 127 lb (57.6 kg)    Physical Exam Constitutional:      General: She is not in acute distress.    Appearance: Normal appearance. She is well-developed.  HENT:     Head: Normocephalic.     Right Ear: External ear normal.     Left Ear: External ear normal.     Nose: Nose normal.  Eyes:     General:        Right eye: No discharge.        Left eye: No discharge.     Conjunctiva/sclera: Conjunctivae normal.     Pupils: Pupils are equal, round, and reactive to light.  Neck:  Thyroid: No thyromegaly.     Vascular: No JVD.     Trachea: No tracheal deviation.  Cardiovascular:     Rate and Rhythm: Normal rate. Rhythm irregular.     Heart sounds: Normal heart sounds.  Pulmonary:     Effort: No respiratory distress.     Breath sounds: No stridor. No wheezing.  Abdominal:     General: Bowel sounds are normal. There is no distension.     Palpations: Abdomen is soft. There is no mass.     Tenderness: There is no abdominal tenderness. There is no guarding or rebound.  Musculoskeletal:        General: No tenderness.     Cervical back: Normal range of motion and neck supple. No rigidity.  Lymphadenopathy:     Cervical: No cervical adenopathy.  Skin:    Findings: No erythema or rash.  Neurological:     Cranial  Nerves: No cranial nerve deficit.     Motor: No abnormal muscle tone.     Coordination: Coordination normal.     Deep Tendon Reflexes: Reflexes normal.  Psychiatric:        Behavior: Behavior normal.        Thought Content: Thought content normal.        Judgment: Judgment normal.   LS w/pain  Lab Results  Component Value Date   WBC 4.6 07/08/2022   HGB 14.2 07/08/2022   HCT 42.8 07/08/2022   PLT 199 07/08/2022   GLUCOSE 94 07/08/2022   CHOL 163 10/15/2014   TRIG 93.0 10/15/2014   HDL 79.80 10/15/2014   LDLCALC 65 10/15/2014   ALT 17 06/26/2021   AST 27 06/26/2021   NA 139 07/08/2022   K 4.2 07/08/2022   CL 102 07/08/2022   CREATININE 0.92 07/08/2022   BUN 27 07/08/2022   CO2 22 07/08/2022   TSH 1.23 09/03/2020   INR 1.1 06/26/2021   HGBA1C 6.5 01/28/2014    MR BRAIN WO CONTRAST  Result Date: 06/26/2021 CLINICAL DATA:  Initial evaluation for acute TIA. EXAM: MRI HEAD WITHOUT CONTRAST MRA HEAD WITHOUT CONTRAST TECHNIQUE: Multiplanar, multi-echo pulse sequences of the brain and surrounding structures were acquired without intravenous contrast. Angiographic images of the Circle of Willis were acquired using MRA technique without intravenous contrast. COMPARISON:  Prior CT from earlier the same day. FINDINGS: MRI HEAD FINDINGS Brain: Generalized age-related cerebral atrophy with moderate chronic small vessel ischemic disease. Small remote lacunar infarct present at the right frontal corona radiata. No evidence for acute or subacute ischemia. No other areas of chronic cortical infarction. No acute or chronic intracranial blood products. No mass lesion, mass effect or midline shift. No hydrocephalus or extra-axial fluid collection. Pituitary gland suprasellar region within normal limits. Vascular: Major intracranial vascular flow voids are maintained. Skull and upper cervical spine: Cranial junction with normal limits. Bone marrow signal intensity normal. No scalp soft tissue  abnormality. Sinuses/Orbits: Prior bilateral ocular lens replacement. Paranasal sinuses are largely clear. No mastoid effusion. Other: None. MRA HEAD FINDINGS Anterior circulation: Both internal carotid arteries patent to the termini without stenosis or other abnormality. A1 segments, anterior communicating artery complex common anterior cerebral arteries patent without stenosis. Normal in stenosis or occlusion. No proximal MCA branch occlusion. Distal MCA branches perfused and symmetric. Distal small vessel atheromatous irregularity. Posterior circulation: Both vertebral arteries patent to the vertebrobasilar junction without stenosis. Left vertebral artery dominant. Left PICA patent. Right PICA not well seen. Basilar patent to its distal aspect without stenosis. Superior  cerebral arteries patent bilaterally. Right PCA supplied via the basilar. Fetal type origin of the left PCA. Both PCAs patent to their distal aspects without stenosis. Anatomic variants: As above.  No aneurysm. IMPRESSION: MRI HEAD IMPRESSION: 1. No acute intracranial abnormality. 2. Generalized age-related cerebral atrophy with moderate chronic small vessel ischemic disease. 3. Small remote lacunar infarct involving the right frontal corona radiata. MRA HEAD IMPRESSION: 1. Negative intracranial MRA for large vessel occlusion. No hemodynamically significant or correctable stenosis. 2. Distal small vessel atheromatous irregularity. Electronically Signed   By: Rise Mu M.D.   On: 06/26/2021 19:18   MR ANGIO HEAD WO CONTRAST  Result Date: 06/26/2021 CLINICAL DATA:  Initial evaluation for acute TIA. EXAM: MRI HEAD WITHOUT CONTRAST MRA HEAD WITHOUT CONTRAST TECHNIQUE: Multiplanar, multi-echo pulse sequences of the brain and surrounding structures were acquired without intravenous contrast. Angiographic images of the Circle of Willis were acquired using MRA technique without intravenous contrast. COMPARISON:  Prior CT from earlier the  same day. FINDINGS: MRI HEAD FINDINGS Brain: Generalized age-related cerebral atrophy with moderate chronic small vessel ischemic disease. Small remote lacunar infarct present at the right frontal corona radiata. No evidence for acute or subacute ischemia. No other areas of chronic cortical infarction. No acute or chronic intracranial blood products. No mass lesion, mass effect or midline shift. No hydrocephalus or extra-axial fluid collection. Pituitary gland suprasellar region within normal limits. Vascular: Major intracranial vascular flow voids are maintained. Skull and upper cervical spine: Cranial junction with normal limits. Bone marrow signal intensity normal. No scalp soft tissue abnormality. Sinuses/Orbits: Prior bilateral ocular lens replacement. Paranasal sinuses are largely clear. No mastoid effusion. Other: None. MRA HEAD FINDINGS Anterior circulation: Both internal carotid arteries patent to the termini without stenosis or other abnormality. A1 segments, anterior communicating artery complex common anterior cerebral arteries patent without stenosis. Normal in stenosis or occlusion. No proximal MCA branch occlusion. Distal MCA branches perfused and symmetric. Distal small vessel atheromatous irregularity. Posterior circulation: Both vertebral arteries patent to the vertebrobasilar junction without stenosis. Left vertebral artery dominant. Left PICA patent. Right PICA not well seen. Basilar patent to its distal aspect without stenosis. Superior cerebral arteries patent bilaterally. Right PCA supplied via the basilar. Fetal type origin of the left PCA. Both PCAs patent to their distal aspects without stenosis. Anatomic variants: As above.  No aneurysm. IMPRESSION: MRI HEAD IMPRESSION: 1. No acute intracranial abnormality. 2. Generalized age-related cerebral atrophy with moderate chronic small vessel ischemic disease. 3. Small remote lacunar infarct involving the right frontal corona radiata. MRA HEAD  IMPRESSION: 1. Negative intracranial MRA for large vessel occlusion. No hemodynamically significant or correctable stenosis. 2. Distal small vessel atheromatous irregularity. Electronically Signed   By: Rise Mu M.D.   On: 06/26/2021 19:18   CT HEAD WO CONTRAST  Result Date: 06/26/2021 CLINICAL DATA:  Provided history: Transient ischemic attack. Additional history provided: Blurry vision in left eye lasting several minutes. EXAM: CT HEAD WITHOUT CONTRAST TECHNIQUE: Contiguous axial images were obtained from the base of the skull through the vertex without intravenous contrast. RADIATION DOSE REDUCTION: This exam was performed according to the departmental dose-optimization program which includes automated exposure control, adjustment of the mA and/or kV according to patient size and/or use of iterative reconstruction technique. COMPARISON:  Report from head CT 09/17/1998 (images unavailable). FINDINGS: Brain: Mild-to-moderate generalized cerebral atrophy. Advanced patchy and confluent hypoattenuation within the cerebral white matter, nonspecific but compatible with chronic small vessel ischemic disease. 4 mm parenchymal calcification within the right frontal  lobe periventricular white matter, likely benign (series 3, image 20). There is no acute intracranial hemorrhage. No demarcated cortical infarct. No extra-axial fluid collection. No evidence of an intracranial mass. No midline shift. Vascular: No hyperdense vessel. Atherosclerotic calcifications. Skull: No fracture or aggressive osseous lesion. Sinuses/Orbits: No mass or acute finding within the imaged orbits. Minimal mucosal thickening within the left frontal ethmoidal recess IMPRESSION: No evidence of acute intracranial abnormality. Advanced chronic small vessel ischemic changes within the cerebral white matter. Mild-to-moderate generalized cerebral atrophy. Electronically Signed   By: Jackey Loge D.O.   On: 06/26/2021 16:35    Assessment &  Plan:   Problem List Items Addressed This Visit     Insomnia - Primary     Zolpidem is inconsistent in it's effect - asking for a brand name - it is too $$$ Will try Zolpidem CR Given Hydroxyzine Rx prn to try without or with Zolpidem CR w/caution       HTN (hypertension)    Cont on Bystolic, Cardizem CD Start Losartan       Relevant Medications   losartan (COZAAR) 50 MG tablet   Atrial fibrillation (HCC)     Cont on Xarelto, Bystolic, Cartia      Relevant Medications   losartan (COZAAR) 50 MG tablet   Low back pain    Re-start Tramadol prn  Potential benefits of a short/long term opioids use as well as potential risks (i.e. addiction risk, apnea etc) and complications (i.e. Somnolence, constipation and others) were explained to the patient and were aknowledged. Blue-Emu cream did not help       Relevant Medications   traMADol (ULTRAM) 50 MG tablet      Meds ordered this encounter  Medications   losartan (COZAAR) 50 MG tablet    Sig: Take 1 tablet (50 mg total) by mouth daily.    Dispense:  90 tablet    Refill:  3   zolpidem (AMBIEN CR) 12.5 MG CR tablet    Sig: Take 1 tablet (12.5 mg total) by mouth at bedtime as needed. for sleep    Dispense:  100 tablet    Refill:  1    'SUN' manufacturer   traMADol (ULTRAM) 50 MG tablet    Sig: Take 1 tablet (50 mg total) by mouth every 8 (eight) hours as needed.    Dispense:  20 tablet    Refill:  1      Follow-up: Return in about 3 months (around 02/02/2023) for a follow-up visit.  Sonda Primes, MD

## 2022-11-02 NOTE — Assessment & Plan Note (Addendum)
Cont on Bystolic, Cardizem CD Start Losartan

## 2022-11-02 NOTE — Assessment & Plan Note (Signed)
Re-start Tramadol prn  Potential benefits of a short/long term opioids use as well as potential risks (i.e. addiction risk, apnea etc) and complications (i.e. Somnolence, constipation and others) were explained to the patient and were aknowledged. Blue-Emu cream did not help

## 2022-11-02 NOTE — Assessment & Plan Note (Signed)
Zolpidem is inconsistent in it's effect - asking for a brand name - it is too $$$ Will try Zolpidem CR Given Hydroxyzine Rx prn to try without or with Zolpidem CR w/caution

## 2022-11-04 ENCOUNTER — Ambulatory Visit: Payer: Medicare Other | Admitting: Physician Assistant

## 2022-11-30 ENCOUNTER — Ambulatory Visit (INDEPENDENT_AMBULATORY_CARE_PROVIDER_SITE_OTHER): Payer: Medicare Other | Admitting: Radiology

## 2022-11-30 DIAGNOSIS — Z23 Encounter for immunization: Secondary | ICD-10-CM | POA: Diagnosis not present

## 2022-11-30 NOTE — Progress Notes (Cosign Needed Addendum)
Patient here for regular dose flu shot. Patient tolerated well with no complications   Medical screening examination/treatment/procedure(s) were performed by non-physician practitioner and as supervising physician I was immediately available for consultation/collaboration.  I agree with above. Jacinta Shoe, MD

## 2023-01-28 ENCOUNTER — Ambulatory Visit: Payer: Medicare Other | Attending: Internal Medicine | Admitting: Internal Medicine

## 2023-01-28 ENCOUNTER — Encounter: Payer: Self-pay | Admitting: Internal Medicine

## 2023-01-28 VITALS — BP 120/70 | HR 82 | Ht 62.0 in | Wt 115.6 lb

## 2023-01-28 DIAGNOSIS — R001 Bradycardia, unspecified: Secondary | ICD-10-CM

## 2023-01-28 DIAGNOSIS — I4821 Permanent atrial fibrillation: Secondary | ICD-10-CM

## 2023-01-28 NOTE — Progress Notes (Signed)
 Patient ID: AFTYN NOTT, female   DOB: 1935-02-11, 88 y.o.   MRN: [id number]       Patient Care Team: [name], MD as PCP - General (Internal Medicine) [name], MD as PCP - Cardiology (Cardiology) [name], MD as PCP - Electrophysiology (Cardiology) [name], MD as Consulting Physician (Cardiology) [name], MD as Consulting Physician (Dermatology) [name], MD as Consulting Physician (Ophthalmology) [name], MD as Consulting Physician (Ophthalmology)   HPI  Selena Taylor is a 88 y.o. female Seen in followup for atrial fibrillation and pericarditis. Because of persistent symptoms she had undergone a pericardial window.  She takes Rivaroxaban     Her husband died 24-Jan-2015. Her son has moved from [place].    On Anticoagulation; no bleeding.  The patient denies chest pain, shortness of breath, nocturnal dyspnea, orthopnea or peripheral edema.  There have been no palpitations, lightheadedness or syncope.     Date Cr K Hgb  6/17 0.89 4.6 13.9  5/18 1.02 4.6 13.8   7/20  1.03 4.9   7/21 0.93 4.5 13.4  3/22 1.1 4.7    8/22 0.99 4.2 12.9 (7/22)  6/23 1.0 4.2 14.7  6/24 0.92 4.2 14.2   DATE TEST EF%   1/13 Echo    1/14 Echo    7/15 Echo 50-55% LAE   4/22 Echo 55-60%     Past Medical History:  Diagnosis Date   Anxiety    CHF (congestive heart failure) (HCC)    Chronic anticoagulation    on coumadin    Endometriosis    HTN (hypertension)    Osteopenia    PAF (paroxysmal atrial fibrillation) (HCC)    has been on Flecainide  in the past. Stopped 02/10/11; Remains on coumadin  anticoagulation; intol to Rythmol  and failed DCCV; noted to be in AFlutter 5/13;  Echo 01/2011:  EF 55-60%, mild MR, mild LAE.  Myoview  6/12:  No ischemia, EF 74%   Pericardial effusion    Pericardial effusion 02/23/2012   Shortness of breath    TIA (transient ischemic attack)     Past Surgical History:  Procedure Laterality Date   APPENDECTOMY      CARDIOVERSION  01/14/2011   Procedure: CARDIOVERSION;  Surgeon: Elspeth BROCKS Fernande, MD;  Location: [facility] OR;  Service: Cardiovascular;  Laterality: N/A;  To be completed in Neuro OR 33 time slot 0830 12/20   CARDIOVERSION  05/27/2011   Procedure: CARDIOVERSION;  Surgeon: Elspeth BROCKS Fernande, MD;  Location: [facility] OR;  Service: Cardiovascular;  Laterality: N/A;   CATARACT EXTRACTION Bilateral    PERICARDIAL WINDOW  02/25/2012   Procedure: PERICARDIAL WINDOW;  Surgeon: Dallas KATHEE Jude, MD;  Location: [facility] OR;  Service: Thoracic;  Laterality: N/A;   POLYPECTOMY     skin cancer removal     TEE WITHOUT CARDIOVERSION  02/25/2012   Procedure: TRANSESOPHAGEAL ECHOCARDIOGRAM (TEE);  Surgeon: Dallas KATHEE Jude, MD;  Location: [facility] OR;  Service: Thoracic;  Laterality: N/A;   TOTAL HYSTERECTOMY AND BILATERAL SALPINGOOPHERECTOMY      Current Outpatient Medications  Medication Sig Dispense Refill   Cholecalciferol  (VITAMIN D3) 2000 units capsule Take 1 capsule (2,000 Units total) by mouth daily. 100 capsule 3   denosumab  (PROLIA ) 60 MG/ML SOSY injection Inject 60 mg into the skin every 6 (six) months.     diltiazem  (CARDIZEM  CD) 240 MG 24 hr capsule TAKE 1 CAPSULE(240 MG) BY MOUTH DAILY 90 capsule 3   hydrOXYzine  (ATARAX ) 25 MG tablet Take 1-2 tablets (  25-50 mg total) by mouth at bedtime as needed (sleep). 60 tablet 2   losartan  (COZAAR ) 50 MG tablet Take 1 tablet (50 mg total) by mouth daily. 90 tablet 3   nebivolol  (BYSTOLIC ) 2.5 MG tablet Take 1 tablet (2.5 mg total) by mouth daily. 90 tablet 2   Rivaroxaban  (XARELTO ) 15 MG TABS tablet TAKE 1 TABLET(15 MG) BY MOUTH DAILY WITH SUPPER. 90 tablet 1   traMADol  (ULTRAM ) 50 MG tablet Take 1 tablet (50 mg total) by mouth every 8 (eight) hours as needed. (Patient taking differently: Take 25 mg by mouth every 8 (eight) hours as needed.) 20 tablet 1   zolpidem  (AMBIEN  CR) 12.5 MG CR tablet Take 1 tablet (12.5 mg total) by mouth at bedtime as needed. for sleep 100 tablet 1   No  current facility-administered medications for this visit.    Allergies  Allergen Reactions   Other Other (See Comments)    Novocaine.  REACTION:  Rapid heart rate   Procaine Hcl     Rapid heart rate. The pt can tolerate Lidocaine  OK.    Review of Systems negative except from HPI and PMH  Physical Exam: BP 120/70   Pulse 82   Ht 5' 2 (1.575 m)   Wt 115 lb 9.6 oz (52.4 kg)   SpO2 98%   BMI 21.14 kg/m  Well developed and well nourished in no acute distress HENT normal Neck supple with JVP-flat   Irregular rate and rhythm with controlled  ventricular response  no  gallop No  murmur Abd-soft with active BS No Clubbing cyanosis  edema Skin-warm and dry A & Oriented  Grossly normal sensory and motor function  ECG atrial fibrillation at 82 Interval-/07/37     Assessment and  Plan  Atrial fibrillation permanent with a Controlled ventricular response   Bradycardia question chronotropic incompetence  Hypertension  Fatigue  Renal insufficiency Grade 3  No symptoms.  Continue diltiazem  and Bystolic  for rate control.  No bleeding on the Xarelto .  Appropriately dosed for an estimated GFR of 38 at 50 mg daily.  Pressures well-controlled.  Continue losartan  Bystolic  and diltiazem 

## 2023-01-28 NOTE — Patient Instructions (Signed)

## 2023-03-02 ENCOUNTER — Telehealth: Payer: Self-pay | Admitting: Internal Medicine

## 2023-03-02 NOTE — Telephone Encounter (Signed)
 Copied from CRM [id number]. Topic: Appointments - Scheduling Inquiry for Clinic >> Mar 02, 2023  4:08 PM Joanell B wrote: Reason for CRM: Pt stated that she would like for someone to give her a call to confirm when she is due for a scheduled Prolia  shot appointment.

## 2023-03-03 ENCOUNTER — Telehealth: Payer: Self-pay | Admitting: Internal Medicine

## 2023-03-03 ENCOUNTER — Other Ambulatory Visit (HOSPITAL_COMMUNITY): Payer: Self-pay

## 2023-03-03 ENCOUNTER — Telehealth: Payer: Self-pay

## 2023-03-03 NOTE — Telephone Encounter (Signed)
 Selena Taylor

## 2023-03-03 NOTE — Telephone Encounter (Signed)
 Pt is needing an updated PA for her Prolia  benefits.

## 2023-03-03 NOTE — Telephone Encounter (Signed)
 error

## 2023-03-03 NOTE — Telephone Encounter (Signed)
 Copied from CRM [id number]. Topic: Appointments - Scheduling Inquiry for Clinic >> Mar 03, 2023 11:57 AM Drema MATSU wrote: Reason for CRM: Patient called to check on her request from yesterday in regards to Prolia  Shot. Advised patient that she should receive a callback before the end of day today.

## 2023-03-03 NOTE — Telephone Encounter (Signed)
 Pt ready for scheduling for Prolia  on or after : 03/03/23  Out-of-pocket cost due at time of visit: $325  Number of injection/visits approved: --  Primary: [employer] [place] Medicare Prolia  co-insurance: 20% Admin fee co-insurance: 100%  Secondary: N/A Prolia  co-insurance:  Admin fee co-insurance:   Medical Benefit Details: Date Benefits were checked: 03/03/23 Deductible: no/ Coinsurance: 20%/ Admin Fee: 100%  Prior Auth: not required (medical buy and bill) PA# Expiration Date:   # of doses approved:  Pharmacy benefit: Copay $-- If patient wants fill through the pharmacy benefit please send prescription to:  -- , and include estimated need by date in rx notes. Pharmacy will ship medication directly to the office.  Patient not eligible for Prolia  Copay Card. Copay Card can make patient's cost as little as $25. Link to apply: https://www.amgensupportplus.com/copay  ** This summary of benefits is an estimation of the patient's out-of-pocket cost. Exact cost may very based on individual plan coverage.

## 2023-03-09 ENCOUNTER — Telehealth: Payer: Self-pay

## 2023-03-09 DIAGNOSIS — M810 Age-related osteoporosis without current pathological fracture: Secondary | ICD-10-CM

## 2023-03-09 NOTE — Telephone Encounter (Signed)
Called and left a voice message for patient to call back regarding their prolia injection. Patient has had pre-certification done and needs to be scheduled.

## 2023-03-18 NOTE — Telephone Encounter (Signed)
 Patient is cleared for PROLIA injection per PA information on 03/02/2023.  provider note last on 10/2022. Medication is CLINIC supplied. ZOXWR:$[id number]

## 2023-04-06 ENCOUNTER — Other Ambulatory Visit: Payer: Self-pay | Admitting: Internal Medicine

## 2023-04-06 DIAGNOSIS — I480 Paroxysmal atrial fibrillation: Secondary | ICD-10-CM

## 2023-04-06 NOTE — Telephone Encounter (Signed)
 Prescription refill request for Xarelto received.  Indication: Afib  Last office visit: 01/28/23 Selena Taylor)  Weight: 52.4kg Age: 88 Scr: 0.92 (07/08/22)  CrCl: 36.48ml/min  Appropriate dose. Refill sent.

## 2023-04-08 NOTE — Telephone Encounter (Signed)
 2nd attempt to call patient and left a voice message for them to call back.

## 2023-04-08 NOTE — Telephone Encounter (Signed)
 Called and spoke with patient and informed them of their OOP cost and scheduled them for their prolia injection. Will also attempt to call their insurance provider to check on their deductible to see if this amount has changed.

## 2023-04-11 MED ORDER — DENOSUMAB 60 MG/ML ~~LOC~~ SOSY
60.0000 mg | PREFILLED_SYRINGE | Freq: Once | SUBCUTANEOUS | Status: AC
  Administered 2023-04-15: 60 mg via SUBCUTANEOUS

## 2023-04-11 NOTE — Addendum Note (Signed)
 Addended by: Delsa Grana R on: 04/11/2023 01:44 PM   Modules accepted: Orders

## 2023-04-13 ENCOUNTER — Other Ambulatory Visit: Payer: Self-pay | Admitting: Internal Medicine

## 2023-04-15 ENCOUNTER — Ambulatory Visit: Payer: Medicare Other

## 2023-04-15 ENCOUNTER — Ambulatory Visit (INDEPENDENT_AMBULATORY_CARE_PROVIDER_SITE_OTHER)

## 2023-04-15 VITALS — BP 122/90 | HR 65 | Ht 62.0 in | Wt 115.6 lb

## 2023-04-15 DIAGNOSIS — Z780 Asymptomatic menopausal state: Secondary | ICD-10-CM

## 2023-04-15 DIAGNOSIS — M810 Age-related osteoporosis without current pathological fracture: Secondary | ICD-10-CM | POA: Diagnosis not present

## 2023-04-15 DIAGNOSIS — Z0000 Encounter for general adult medical examination without abnormal findings: Secondary | ICD-10-CM

## 2023-04-15 MED ORDER — DENOSUMAB 60 MG/ML ~~LOC~~ SOSY: 60.0000 mg | PREFILLED_SYRINGE | Freq: Once | SUBCUTANEOUS | Status: AC

## 2023-04-15 NOTE — Progress Notes (Signed)
 Subjective:   Selena Taylor is a 88 y.o. who presents for a Medicare Wellness preventive visit.  Visit Complete: In person  Persons Participating in Visit: Patient.  AWV Questionnaire: No: Patient Medicare AWV questionnaire was not completed prior to this visit.  Cardiac Risk Factors include: advanced age (>30men, >52 women);hypertension;Other (see comment), Risk factor comments: Atrial fibrillation,  TIA (transient ischemic attack)     Objective:    Today's Vitals   04/15/23 0959  Weight: 115 lb 9.6 oz (52.4 kg)  Height: 5\' 2"  (1.575 m)   Body mass index is 21.14 kg/m.     04/15/2023   10:08 AM 11/09/2021    9:54 AM 06/26/2021    3:55 PM 09/12/2020   10:56 AM 12/13/2018   11:07 AM 03/22/2018   11:13 AM 05/24/2016   11:32 AM  Advanced Directives  Does Patient Have a Medical Advance Directive? Yes Yes No Yes Yes Yes Yes  Type of Estate agent of [place];Living will Healthcare Power of [place];Living will  Living will;Healthcare Power of [employer] Power of [place];Living will Healthcare Power of [place];Living will Healthcare Power of [place];Living will  Does patient want to make changes to medical advance directive?    No - Patient declined     Copy of Healthcare Power of Attorney in Chart? No - copy requested No - copy requested  No - copy requested No - copy requested No - copy requested No - copy requested  Would patient like information on creating a medical advance directive?   No - Patient declined        Current Medications (verified) Outpatient Encounter Medications as of 04/15/2023  Medication Sig   Cholecalciferol (VITAMIN D3) 2000 units capsule Take 1 capsule (2,000 Units total) by mouth daily.   denosumab (PROLIA) 60 MG/ML SOSY injection Inject 60 mg into the skin every 6 (six) months.   diltiazem (CARDIZEM CD) 240 MG 24 hr capsule TAKE 1 CAPSULE(240 MG) BY MOUTH DAILY   hydrOXYzine (ATARAX) 25 MG tablet Take 1-2 tablets (25-50  mg total) by mouth at bedtime as needed (sleep).   losartan (COZAAR) 50 MG tablet Take 1 tablet (50 mg total) by mouth daily.   nebivolol (BYSTOLIC) 2.5 MG tablet TAKE 1 TABLET(2.5 MG) BY MOUTH DAILY   traMADol (ULTRAM) 50 MG tablet Take 1 tablet (50 mg total) by mouth every 8 (eight) hours as needed. (Patient taking differently: Take 25 mg by mouth every 8 (eight) hours as needed.)   XARELTO 15 MG TABS tablet TAKE 1 TABLET(15 MG) BY MOUTH DAILY WITH SUPPER   zolpidem (AMBIEN CR) 12.5 MG CR tablet TAKE 1 TABLET BY MOUTH AT BEDTIME AS NEEDED FOR SLEEP   Facility-Administered Encounter Medications as of 04/15/2023  Medication   denosumab (PROLIA) injection 60 mg    Allergies (verified) Other and Procaine hcl   History: Past Medical History:  Diagnosis Date   Anxiety    CHF (congestive heart failure) (HCC)    Chronic anticoagulation    on coumadin   Endometriosis    HTN (hypertension)    Osteopenia    PAF (paroxysmal atrial fibrillation) (HCC)    has been on Flecainide in the past. Stopped 02/10/11; Remains on coumadin anticoagulation; intol to Rythmol and failed DCCV; noted to be in AFlutter 5/13;  Echo 01/2011:  EF 55-60%, mild MR, mild LAE.  Myoview 6/12:  No ischemia, EF 74%   Pericardial effusion    Pericardial effusion 02/23/2012   Shortness of breath  TIA (transient ischemic attack)    Past Surgical History:  Procedure Laterality Date   APPENDECTOMY     CARDIOVERSION  01/14/2011   Procedure: CARDIOVERSION;  Surgeon: Duke Salvia, MD;  Location: [facility] OR;  Service: Cardiovascular;  Laterality: N/A;  To be completed in Neuro OR 33 time slot 0830 12/20   CARDIOVERSION  05/27/2011   Procedure: CARDIOVERSION;  Surgeon: Duke Salvia, MD;  Location: [facility] OR;  Service: Cardiovascular;  Laterality: N/A;   CATARACT EXTRACTION Bilateral    PERICARDIAL WINDOW  02/25/2012   Procedure: PERICARDIAL WINDOW;  Surgeon: Delight Ovens, MD;  Location: [facility] OR;  Service: Thoracic;  Laterality:  N/A;   POLYPECTOMY     skin cancer removal     TEE WITHOUT CARDIOVERSION  02/25/2012   Procedure: TRANSESOPHAGEAL ECHOCARDIOGRAM (TEE);  Surgeon: Delight Ovens, MD;  Location: [facility] OR;  Service: Thoracic;  Laterality: N/A;   TOTAL HYSTERECTOMY AND BILATERAL SALPINGOOPHERECTOMY     Family History  Problem Relation Age of Onset   Cancer Brother        myeloma   Heart disease Other        Father age 10   Social History   Socioeconomic History   Marital status: Widowed    Spouse name: Not on file   Number of children: 1   Years of education: Not on file   Highest education level: Not on file  Occupational History   Occupation: retired  Tobacco Use   Smoking status: Never   Smokeless tobacco: Never  Vaping Use   Vaping status: Never Used  Substance and Sexual Activity   Alcohol use: Yes    Alcohol/week: 14.0 standard drinks of alcohol    Types: 14 Glasses of wine per week    Comment: 1-2 glasses of wine nightly   Drug use: No   Sexual activity: Not Currently  Other Topics Concern   Not on file  Social History Narrative   HSG; Became a stewardness..Married - 1959.Marland Kitchen1 son - '65; 1 daughter '60; 2 grandchildren..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         Son lives with patient/2025   Social Drivers of Health   Financial Resource Strain: Low Risk  (04/15/2023)   Overall Financial Resource Strain (CARDIA)    Difficulty of Paying Living Expenses: Not hard at all  Food Insecurity: No Food Insecurity (04/15/2023)   Hunger Vital Sign    Worried About Running Out of Food in the Last Year: Never true    Ran Out of Food in the Last Year: Never true  Transportation Needs: No Transportation Needs (04/15/2023)   PRAPARE - Administrator, Civil Service (Medical): No    Lack of Transportation (Non-Medical): No  Physical Activity: Insufficiently Active (04/15/2023)   Exercise Vital Sign     Days of Exercise per Week: 4 days    Minutes of Exercise per Session: 20 min  Stress: No Stress Concern Present (04/15/2023)   Selena Taylor of Occupational Health - Occupational Stress Questionnaire    Feeling of Stress : Not at all  Social Connections: Moderately Isolated (04/15/2023)   Social Connection and Isolation Panel [NHANES]    Frequency of Communication with Friends and Family: Never    Frequency of Social Gatherings with Friends and Family: Twice a week    Attends Religious Services: More than 4 times per year    Active Member  of Clubs or Organizations: Yes    Attends Banker Meetings: Never    Marital Status: Widowed    Tobacco Counseling Counseling given: Not Answered    Clinical Intake:  Pre-visit preparation completed: Yes  Pain : No/denies pain     BMI - recorded: 21.14 Nutritional Status: BMI of 19-24  Normal Nutritional Risks: None Diabetes: No  Lab Results  Component Value Date   HGBA1C 6.5 01/28/2014     How often do you need to have someone help you when you read instructions, pamphlets, or other written materials from your doctor or pharmacy?: 1 - Never  Interpreter Needed?: No  Information entered by :: Berenice Oehlert, RMA   Activities of Daily Living     04/15/2023    9:48 AM  In your present state of health, do you have any difficulty performing the following activities:  Hearing? 0  Vision? 0  Difficulty concentrating or making decisions? 0  Walking or climbing stairs? 0  Dressing or bathing? 0  Doing errands, shopping? 0  Preparing Food and eating ? N  Using the Toilet? N  In the past six months, have you accidently leaked urine? N  Do you have problems with loss of bowel control? N  Managing your Medications? N  Managing your Finances? N  Housekeeping or managing your Housekeeping? N    Patient Care Team: Plotnikov, Georgina Quint, MD as PCP - General (Internal Medicine) Duke Salvia, MD as PCP - Cardiology  (Cardiology) Duke Salvia, MD as PCP - Electrophysiology (Cardiology) Duke Salvia, MD as Consulting Physician (Cardiology) Venancio Poisson, MD as Consulting Physician (Dermatology) Mckinley Jewel, MD as Consulting Physician (Ophthalmology) Janet Berlin, MD as Consulting Physician (Ophthalmology)  Indicate any recent Medical Services you may have received from other than [facility] providers in the past year (date may be approximate).     Assessment:   This is a routine wellness examination for Selena Taylor.  Hearing/Vision screen Hearing Screening - Comments:: Denies hearing difficulties   Vision Screening - Comments:: Denies vision issues.    Goals Addressed             This Visit's Progress    DIET - INCREASE WATER INTAKE   On track      Depression Screen     04/15/2023   10:10 AM 11/02/2022   10:18 AM 04/22/2022   10:06 AM 12/03/2021   10:07 AM 11/09/2021    9:32 AM 07/08/2021    4:16 PM 09/12/2020   10:55 AM  PHQ 2/9 Scores  PHQ - 2 Score 0 0 0 0 0 0 0  PHQ- 9 Score 0   2       Fall Risk     04/15/2023   10:08 AM 11/02/2022   10:17 AM 04/22/2022   10:06 AM 12/03/2021   10:06 AM 11/09/2021    9:35 AM  Fall Risk   Falls in the past year? 0 0 0 0 0  Number falls in past yr: 0 0 0 0 0  Injury with Fall? 0 0 0 0 0  Risk for fall due to : No Fall Risks No Fall Risks No Fall Risks No Fall Risks No Fall Risks  Follow up Falls prevention discussed;Falls evaluation completed Falls evaluation completed Falls evaluation completed  Falls prevention discussed    MEDICARE RISK AT HOME:  Medicare Risk at Home Any stairs in or around the home?: No Home free of loose throw rugs in walkways, pet beds,  electrical cords, etc?: Yes Adequate lighting in your home to reduce risk of falls?: Yes Life alert?: No Use of a cane, walker or w/c?: No Grab bars in the bathroom?: Yes Shower chair or bench in shower?: No Elevated toilet seat or a handicapped toilet?: No  TIMED UP AND  GO:  Was the test performed?  Yes  Length of time to ambulate 10 feet: 15 sec Gait steady and fast without use of assistive device  Cognitive Function: Normal: Normal cognitive status assessed by direct observation by this Clinical Health Advisor. No abnormalities found. Patient is able to answer questions in an accurate and timely manner.        11/09/2021    9:38 AM  6CIT Screen  What Year? 0 points  What month? 0 points  What time? 0 points  Count back from 20 0 points  Months in reverse 0 points  Repeat phrase 0 points  Total Score 0 points    Immunizations Immunization History  Administered Date(s) Administered   Fluad Quad(high Dose 65+) 10/23/2018, 11/16/2019, 11/10/2020, 11/09/2021   Influenza Split 11/18/2011   Influenza Whole 12/08/2007, 11/18/2008, 11/17/2009   Influenza, High Dose Seasonal PF 11/08/2012, 10/29/2013, 10/07/2014, 10/27/2015, 11/18/2016, 10/17/2017   Influenza, Seasonal, Injecte, Preservative Fre 11/30/2022   PFIZER(Purple Top)SARS-COV-2 Vaccination 03/05/2019, 03/30/2019   Pneumococcal Conjugate-13 08/14/2014   Pneumococcal Polysaccharide-23 11/27/2002, 06/04/2016   Td 08/11/2009   Zoster, Live 02/18/2009    Screening Tests Health Maintenance  Topic Date Due   COVID-19 Vaccine (3 - Pfizer risk series) 04/27/2019   DTaP/Tdap/Td (2 - Tdap) 08/12/2019   Medicare Annual Wellness (AWV)  04/14/2024   Pneumonia Vaccine 17+ Years old  Completed   INFLUENZA VACCINE  Completed   DEXA SCAN  Completed   HPV VACCINES  Aged Out   Zoster Vaccines- Shingrix  Discontinued    Health Maintenance  Health Maintenance Due  Topic Date Due   COVID-19 Vaccine (3 - Pfizer risk series) 04/27/2019   DTaP/Tdap/Td (2 - Tdap) 08/12/2019   Health Maintenance Items Addressed: DEXA ordered  Additional Screening:  Vision Screening: Recommended annual ophthalmology exams for early detection of glaucoma and other disorders of the eye.  Dental Screening:  Recommended annual dental exams for proper oral hygiene  Community Resource Referral / Chronic Care Management: CRR required this visit?  No   CCM required this visit?  No     Plan:     I have personally reviewed and noted the following in the patient's chart:   Medical and social history Use of alcohol, tobacco or illicit drugs  Current medications and supplements including opioid prescriptions. Patient is currently taking opioid prescriptions. Information provided to patient regarding non-opioid alternatives. Patient advised to discuss non-opioid treatment plan with their provider. Functional ability and status Nutritional status Physical activity Advanced directives List of other physicians Hospitalizations, surgeries, and ER visits in previous 12 months Vitals Screenings to include cognitive, depression, and falls Referrals and appointments  In addition, I have reviewed and discussed with patient certain preventive protocols, quality metrics, and best practice recommendations. A written personalized care plan for preventive services as well as general preventive health recommendations were provided to patient.     Avyon Herendeen L Candida Vetter, CMA   04/15/2023   After Visit Summary: (MyChart) Due to this being a telephonic visit, the after visit summary with patients personalized plan was offered to patient via MyChart   Notes: Please refer to Routing Comments.

## 2023-04-15 NOTE — Patient Instructions (Addendum)
 [name] , Thank you for taking time to come for your Medicare Wellness Visit. I appreciate your ongoing commitment to your health goals. Please review the following plan we discussed and let me know if I can assist you in the future.   Referrals/Orders/Follow-Ups/Clinician Recommendations: It was nice to meet you today.  You are due for a Tetanus vaccine. You have an order for:  [x]   Bone Density     Please call for appointment:  [facility] [address] [place], [place] [address] [phone]   Make sure to wear two-piece clothing.  No lotions, powders, or deodorants the day of the appointment. Make sure to bring picture ID and insurance card.  Bring list of medications you are currently taking including any supplements.    This is a list of the screening recommended for you and due dates:  Health Maintenance  Topic Date Due   COVID-19 Vaccine (3 - Pfizer risk series) 04/27/2019   DTaP/Tdap/Td vaccine (2 - Tdap) 08/12/2019   Medicare Annual Wellness Visit  11/10/2022   Pneumonia Vaccine  Completed   Flu Shot  Completed   DEXA scan (bone density measurement)  Completed   HPV Vaccine  Aged Out   Zoster (Shingles) Vaccine  Discontinued    Advanced directives: (Copy Requested) Please bring a copy of your health care power of attorney and living will to the office to be added to your chart at your convenience. You can mail to [facility] [address] [place], [place] [address] or email to [email or web address]  Next Medicare Annual Wellness Visit scheduled for next year: Yes  Managing Pain Without Opioids Opioids are strong medicines used to treat moderate to severe pain. For some people, especially those who have long-term (chronic) pain, opioids may not be the best choice for pain management due to: Side effects like nausea, constipation, and sleepiness. The risk of addiction (opioid use disorder). The longer you take opioids,  the greater your risk of addiction. Pain that lasts for more than 3 months is called chronic pain. Managing chronic pain usually requires more than one approach and is often provided by a team of health care providers working together (multidisciplinary approach). Pain management may be done at a pain management center or pain clinic. How to manage pain without the use of opioids Use non-opioid medicines Non-opioid medicines for pain may include: Over-the-counter or prescription non-steroidal anti-inflammatory drugs (NSAIDs). These may be the first medicines used for pain. They work well for muscle and bone pain, and they reduce swelling. Acetaminophen. This over-the-counter medicine may work well for milder pain but not swelling. Antidepressants. These may be used to treat chronic pain. A certain type of antidepressant (tricyclics) is often used. These medicines are given in lower doses for pain than when used for depression. Anticonvulsants. These are usually used to treat seizures but may also reduce nerve (neuropathic) pain. Muscle relaxants. These relieve pain caused by sudden muscle tightening (spasms). You may also use a pain medicine that is applied to the skin as a patch, cream, or gel (topical analgesic), such as a numbing medicine. These may cause fewer side effects than medicines taken by mouth. Do certain therapies as directed Some therapies can help with pain management. They include: Physical therapy. You will do exercises to gain strength and flexibility. A physical therapist may teach you exercises to move and stretch parts of your body that are weak, stiff, or painful. You can learn these exercises  at physical therapy visits and practice them at home. Physical therapy may also involve: Massage. Heat wraps or applying heat or cold to affected areas. Electrical signals that interrupt pain signals (transcutaneous electrical nerve stimulation, TENS). Weak lasers that reduce pain and  swelling (low-level laser therapy). Signals from your body that help you learn to regulate pain (biofeedback). Occupational therapy. This helps you to learn ways to function at home and work with less pain. Recreational therapy. This involves trying new activities or hobbies, such as a physical activity or drawing. Mental health therapy, including: Cognitive behavioral therapy (CBT). This helps you learn coping skills for dealing with pain. Acceptance and commitment therapy (ACT) to change the way you think and react to pain. Relaxation therapies, including muscle relaxation exercises and mindfulness-based stress reduction. Pain management counseling. This may be individual, family, or group counseling.  Receive medical treatments Medical treatments for pain management include: Nerve block injections. These may include a pain blocker and anti-inflammatory medicines. You may have injections: Near the spine to relieve chronic back or neck pain. Into joints to relieve back or joint pain. Into nerve areas that supply a painful area to relieve body pain. Into muscles (trigger point injections) to relieve some painful muscle conditions. A medical device placed near your spine to help block pain signals and relieve nerve pain or chronic back pain (spinal cord stimulation device). Acupuncture. Follow these instructions at home Medicines Take over-the-counter and prescription medicines only as told by your health care provider. If you are taking pain medicine, ask your health care providers about possible side effects to watch out for. Do not drive or use heavy machinery while taking prescription opioid pain medicine. Lifestyle  Do not use drugs or alcohol to reduce pain. If you drink alcohol, limit how much you have to: 0-1 drink a day for women who are not pregnant. 0-2 drinks a day for men. Know how much alcohol is in a drink. In the [place] one drink equals one 12 oz bottle of beer (355 mL),  one 5 oz glass of wine (148 mL), or one 1 oz glass of hard liquor (44 mL). Do not use any products that contain nicotine or tobacco. These products include cigarettes, chewing tobacco, and vaping devices, such as e-cigarettes. If you need help quitting, ask your health care provider. Eat a healthy diet and maintain a healthy weight. Poor diet and excess weight may make pain worse. Eat foods that are high in fiber. These include fresh fruits and vegetables, whole grains, and beans. Limit foods that are high in fat and processed sugars, such as fried and sweet foods. Exercise regularly. Exercise lowers stress and may help relieve pain. Ask your health care provider what activities and exercises are safe for you. If your health care provider approves, join an exercise class that combines movement and stress reduction. Examples include yoga and tai chi. Get enough sleep. Lack of sleep may make pain worse. Lower stress as much as possible. Practice stress reduction techniques as told by your therapist. General instructions Work with all your pain management providers to find the treatments that work best for you. You are an important member of your pain management team. There are many things you can do to reduce pain on your own. Consider joining an online or in-person support group for people who have chronic pain. Keep all follow-up visits. This is important. Where to find more information You can find more information about managing pain without opioids from: American Academy  of Pain Medicine: painmed.org Institute for Chronic Pain: instituteforchronicpain.org American Chronic Pain Association: theacpa.org Contact a health care provider if: You have side effects from pain medicine. Your pain gets worse or does not get better with treatments or home therapy. You are struggling with anxiety or depression. Summary Many types of pain can be managed without opioids. Chronic pain may respond better  to pain management without opioids. Pain is best managed when you and a team of health care providers work together. Pain management without opioids may include non-opioid medicines, medical treatments, physical therapy, mental health therapy, and lifestyle changes. Tell your health care providers if your pain gets worse or is not being managed well enough. This information is not intended to replace advice given to you by your health care provider. Make sure you discuss any questions you have with your health care provider. Document Revised: 04/23/2020 Document Reviewed: 04/23/2020 Elsevier Patient Education  2024 [employer].

## 2023-04-15 NOTE — Progress Notes (Signed)
Prolia given w/o complications 

## 2023-04-18 ENCOUNTER — Telehealth: Payer: Self-pay

## 2023-04-18 NOTE — Telephone Encounter (Signed)
 Pharmacy Patient Advocate Encounter  Received notification from [facility]  that Prior Authorization for Zolpidem ER 12.5 mg tablets has been DENIED.  Full denial letter will be uploaded to the media tab. See denial reason below.   PA #/Case ID/Reference #: --  *drug is not covered under patients plan. Will need to pay out of pocket or therapy will need to be changed

## 2023-04-19 NOTE — Telephone Encounter (Signed)
 Copied from CRM [id number]. Topic: Clinical - Prescription Issue >> Apr 18, 2023  5:04 PM Denese Killings wrote: Reason for CRM: Patient states that she needs Dr. Posey Rea to call [facility] to approve her to continue to stay on medication  for zolpidem (AMBIEN CR) 12.5 MG CR tablet so she can continue getting it and they can continue covering it.

## 2023-04-25 ENCOUNTER — Telehealth: Payer: Self-pay | Admitting: Internal Medicine

## 2023-04-25 NOTE — Telephone Encounter (Signed)
 Copied from CRM [id number]. Topic: Clinical - Prescription Issue >> Apr 25, 2023 10:15 AM Eunice Blase wrote: Reason for CRM: Pt called stated that insurance denied zolpidem (AMBIEN CR) 12.5 MG CR tablet,  must be approved by Dr. Posey Rea. Please call pt at [phone]

## 2023-04-26 NOTE — Telephone Encounter (Signed)
Okay with me. Thanks 

## 2023-04-27 ENCOUNTER — Ambulatory Visit: Admitting: Internal Medicine

## 2023-04-29 NOTE — Telephone Encounter (Signed)
 Called and spoke with patient. Insurance will be requiring a peer to peer before approving medication. Patient will be bringing in forms next Wednesday with information on how to initiate this.

## 2023-05-06 NOTE — Telephone Encounter (Signed)
 Copied from CRM [id number]. Topic: Clinical - Medication Question >> May 06, 2023 10:17 AM Lennart Pall wrote: Reason for CRM: Patient had a question for [facility] about a medication.Marland Kitchen

## 2023-05-11 NOTE — Telephone Encounter (Signed)
 Called and spoke with patient. Informed her that I did receive her form for appeal on her medication. Stated that I would need the signature of Dr.Plotnikov and that he would be back in on Monday. Patient expressed understanding

## 2023-05-19 ENCOUNTER — Ambulatory Visit: Admitting: Internal Medicine

## 2023-05-20 NOTE — Telephone Encounter (Signed)
 Was able to reach out to patient insurance and initiated an expedited appeal over the phone. They informed me the result of this appeal will be given in 72 hours

## 2023-05-23 NOTE — Telephone Encounter (Signed)
 Copied from CRM [id number]. Topic: Referral - Prior Authorization Question >> May 23, 2023 10:09 AM DeAngela L wrote: Reason for CRM: Returned call from [place] with [employer] department calling about the Expedited appeal for patient medication zolpidem  Calling to speak with Zac  Call number for [facility] [phone] this is her direct line please leave a vm if needed

## 2023-05-23 NOTE — Telephone Encounter (Signed)
 Attempted to call back and had to leave a voice message. Informed them that I should be free most of tomorrow for a call back

## 2023-05-25 DIAGNOSIS — H0012 Chalazion right lower eyelid: Secondary | ICD-10-CM | POA: Diagnosis not present

## 2023-05-31 ENCOUNTER — Encounter: Payer: Self-pay | Admitting: Internal Medicine

## 2023-05-31 ENCOUNTER — Ambulatory Visit (INDEPENDENT_AMBULATORY_CARE_PROVIDER_SITE_OTHER): Admitting: Internal Medicine

## 2023-05-31 VITALS — BP 110/80 | HR 73 | Ht 62.0 in | Wt 117.0 lb

## 2023-05-31 DIAGNOSIS — I10 Essential (primary) hypertension: Secondary | ICD-10-CM | POA: Diagnosis not present

## 2023-05-31 DIAGNOSIS — I480 Paroxysmal atrial fibrillation: Secondary | ICD-10-CM

## 2023-05-31 DIAGNOSIS — Z7901 Long term (current) use of anticoagulants: Secondary | ICD-10-CM | POA: Diagnosis not present

## 2023-05-31 DIAGNOSIS — Z0000 Encounter for general adult medical examination without abnormal findings: Secondary | ICD-10-CM

## 2023-05-31 DIAGNOSIS — M810 Age-related osteoporosis without current pathological fracture: Secondary | ICD-10-CM

## 2023-05-31 DIAGNOSIS — F5104 Psychophysiologic insomnia: Secondary | ICD-10-CM

## 2023-05-31 DIAGNOSIS — R7309 Other abnormal glucose: Secondary | ICD-10-CM

## 2023-05-31 MED ORDER — ZOLPIDEM TARTRATE ER 12.5 MG PO TBCR: 12.5000 mg | EXTENDED_RELEASE_TABLET | Freq: Every evening | ORAL | 1 refills | Status: DC | PRN

## 2023-05-31 NOTE — Assessment & Plan Note (Signed)
?  Cont on Xarelto, Bystolic, Cartia ?

## 2023-05-31 NOTE — Addendum Note (Signed)
 Addended by: Janyah Singleterry V on: 05/31/2023 09:57 AM   Modules accepted: Orders

## 2023-05-31 NOTE — Assessment & Plan Note (Signed)
 Check A1c.

## 2023-05-31 NOTE — Assessment & Plan Note (Signed)
   We discussed age appropriate health related issues, including available/recomended screening tests and vaccinations. Labs were ordered to be later reviewed . All questions were answered. We discussed one or more of the following - seat belt use, use of sunscreen/sun exposure exercise, fall risk reduction, second hand smoke exposure, firearm use and storage, seat belt use, a need for adhering to healthy diet and exercise. Labs were ordered.  All questions were answered. Get a tDAP 2022 Shinggrix Eye exam, mammo

## 2023-05-31 NOTE — Assessment & Plan Note (Signed)
 On Xarelto

## 2023-05-31 NOTE — Assessment & Plan Note (Signed)
 On Zolpidem  CR 12.5 mg at bedtime  Potential benefits of a long term benzodiazepines  use as well as potential risks  and complications were explained to the patient and were aknowledged.

## 2023-05-31 NOTE — Progress Notes (Signed)
 Subjective:  Patient ID: Selena Taylor, female    DOB: 22-Mar-1935  Age: 88 y.o. MRN: [id number]  CC: Medical Management of Chronic Issues (Annual Exam. FYI that patient recently had an eye infection, finishing up script of Amox-Clav 500-125 mg and Neo/poly/dex Opth susp 5 ml) and Medication Problem (Insurance will not approve for patient's zolpidem  script without patient attempting 3 of the formulary medications before hand (Belsomra, Dayvigo, Ramelteon, and Temazepam 15 mg or 30 mg))   HPI Latorsha H Timko presents for a well exam F/u - that patient recently had an eye infection, finishing up script of Amox-Clav 500-125 mg and Neo/poly/dex Opth susp 5 ml) and Medication Problem (Insurance will not approve for patient's zolpidem  script without patient attempting 3 of the formulary medications before hand (Belsomra, Dayvigo, Ramelteon, and Temazepam 15 mg or 30 mg)  Outpatient Medications Prior to Visit  Medication Sig Dispense Refill   Cholecalciferol  (VITAMIN D3) 2000 units capsule Take 1 capsule (2,000 Units total) by mouth daily. 100 capsule 3   denosumab  (PROLIA ) 60 MG/ML SOSY injection Inject 60 mg into the skin every 6 (six) months.     diltiazem  (CARDIZEM  CD) 240 MG 24 hr capsule TAKE 1 CAPSULE(240 MG) BY MOUTH DAILY 90 capsule 3   hydrOXYzine  (ATARAX ) 25 MG tablet Take 1-2 tablets (25-50 mg total) by mouth at bedtime as needed (sleep). 60 tablet 2   losartan  (COZAAR ) 50 MG tablet Take 1 tablet (50 mg total) by mouth daily. 90 tablet 3   nebivolol  (BYSTOLIC ) 2.5 MG tablet TAKE 1 TABLET(2.5 MG) BY MOUTH DAILY 90 tablet 3   traMADol  (ULTRAM ) 50 MG tablet Take 1 tablet (50 mg total) by mouth every 8 (eight) hours as needed. (Patient taking differently: Take 25 mg by mouth every 8 (eight) hours as needed.) 20 tablet 1   XARELTO  15 MG TABS tablet TAKE 1 TABLET(15 MG) BY MOUTH DAILY WITH SUPPER 90 tablet 1   zolpidem  (AMBIEN  CR) 12.5 MG CR tablet TAKE 1 TABLET BY MOUTH AT BEDTIME AS NEEDED FOR  SLEEP 100 tablet 1   Facility-Administered Medications Prior to Visit  Medication Dose Route Frequency Provider Last Rate Last Admin   [START ON 10/16/2023] denosumab  (PROLIA ) injection 60 mg  60 mg Subcutaneous Once Kayti Poss V, MD        ROS: Review of Systems  Constitutional:  Negative for activity change, appetite change, chills, fatigue and unexpected weight change.  HENT:  Negative for congestion, mouth sores and sinus pressure.   Eyes:  Negative for visual disturbance.  Respiratory:  Negative for cough and chest tightness.   Gastrointestinal:  Negative for abdominal pain and nausea.  Genitourinary:  Negative for difficulty urinating, frequency and vaginal pain.  Musculoskeletal:  Negative for back pain and gait problem.  Skin:  Negative for pallor and rash.  Neurological:  Negative for dizziness, tremors, weakness, numbness and headaches.  Psychiatric/Behavioral:  Positive for sleep disturbance. Negative for confusion and suicidal ideas.     Objective:  BP 110/80   Pulse 73   Ht 5\' 2"  (1.575 m)   Wt 117 lb (53.1 kg)   SpO2 98%   BMI 21.40 kg/m   BP Readings from Last 3 Encounters:  05/31/23 110/80  04/15/23 (!) 122/90  01/28/23 120/70    Wt Readings from Last 3 Encounters:  05/31/23 117 lb (53.1 kg)  04/15/23 115 lb 9.6 oz (52.4 kg)  01/28/23 115 lb 9.6 oz (52.4 kg)    Physical Exam Constitutional:  General: She is not in acute distress.    Appearance: She is well-developed.  HENT:     Head: Normocephalic.     Right Ear: External ear normal.     Left Ear: External ear normal.     Nose: Nose normal.  Eyes:     General:        Right eye: No discharge.        Left eye: No discharge.     Conjunctiva/sclera: Conjunctivae normal.     Pupils: Pupils are equal, round, and reactive to light.  Neck:     Thyroid : No thyromegaly.     Vascular: No JVD.     Trachea: No tracheal deviation.  Cardiovascular:     Rate and Rhythm: Normal rate and regular  rhythm.     Heart sounds: Normal heart sounds.  Pulmonary:     Effort: No respiratory distress.     Breath sounds: No stridor. No wheezing.  Abdominal:     General: Bowel sounds are normal. There is no distension.     Palpations: Abdomen is soft. There is no mass.     Tenderness: There is no abdominal tenderness. There is no guarding or rebound.  Musculoskeletal:        General: No tenderness.     Cervical back: Normal range of motion and neck supple. No rigidity.  Lymphadenopathy:     Cervical: No cervical adenopathy.  Skin:    Findings: No erythema or rash.  Neurological:     Mental Status: She is oriented to person, place, and time.     Cranial Nerves: No cranial nerve deficit.     Motor: No abnormal muscle tone.     Coordination: Coordination normal.     Deep Tendon Reflexes: Reflexes normal.  Psychiatric:        Behavior: Behavior normal.        Thought Content: Thought content normal.        Judgment: Judgment normal.     Lab Results  Component Value Date   WBC 4.6 07/08/2022   HGB 14.2 07/08/2022   HCT 42.8 07/08/2022   PLT 199 07/08/2022   GLUCOSE 94 07/08/2022   CHOL 163 10/15/2014   TRIG 93.0 10/15/2014   HDL 79.80 10/15/2014   LDLCALC 65 10/15/2014   ALT 17 06/26/2021   AST 27 06/26/2021   NA 139 07/08/2022   K 4.2 07/08/2022   CL 102 07/08/2022   CREATININE 0.92 07/08/2022   BUN 27 07/08/2022   CO2 22 07/08/2022   TSH 1.23 09/03/2020   INR 1.1 06/26/2021   HGBA1C 6.5 01/28/2014    MR BRAIN WO CONTRAST Result Date: 06/26/2021 CLINICAL DATA:  Initial evaluation for acute TIA. EXAM: MRI HEAD WITHOUT CONTRAST MRA HEAD WITHOUT CONTRAST TECHNIQUE: Multiplanar, multi-echo pulse sequences of the brain and surrounding structures were acquired without intravenous contrast. Angiographic images of the Circle of Willis were acquired using MRA technique without intravenous contrast. COMPARISON:  Prior CT from earlier the same day. FINDINGS: MRI HEAD FINDINGS Brain:  Generalized age-related cerebral atrophy with moderate chronic small vessel ischemic disease. Small remote lacunar infarct present at the right frontal corona radiata. No evidence for acute or subacute ischemia. No other areas of chronic cortical infarction. No acute or chronic intracranial blood products. No mass lesion, mass effect or midline shift. No hydrocephalus or extra-axial fluid collection. Pituitary gland suprasellar region within normal limits. Vascular: Major intracranial vascular flow voids are maintained. Skull and upper cervical spine: Cranial  junction with normal limits. Bone marrow signal intensity normal. No scalp soft tissue abnormality. Sinuses/Orbits: Prior bilateral ocular lens replacement. Paranasal sinuses are largely clear. No mastoid effusion. Other: None. MRA HEAD FINDINGS Anterior circulation: Both internal carotid arteries patent to the termini without stenosis or other abnormality. A1 segments, anterior communicating artery complex common anterior cerebral arteries patent without stenosis. Normal in stenosis or occlusion. No proximal MCA branch occlusion. Distal MCA branches perfused and symmetric. Distal small vessel atheromatous irregularity. Posterior circulation: Both vertebral arteries patent to the vertebrobasilar junction without stenosis. Left vertebral artery dominant. Left PICA patent. Right PICA not well seen. Basilar patent to its distal aspect without stenosis. Superior cerebral arteries patent bilaterally. Right PCA supplied via the basilar. Fetal type origin of the left PCA. Both PCAs patent to their distal aspects without stenosis. Anatomic variants: As above.  No aneurysm. IMPRESSION: MRI HEAD IMPRESSION: 1. No acute intracranial abnormality. 2. Generalized age-related cerebral atrophy with moderate chronic small vessel ischemic disease. 3. Small remote lacunar infarct involving the right frontal corona radiata. MRA HEAD IMPRESSION: 1. Negative intracranial MRA for  large vessel occlusion. No hemodynamically significant or correctable stenosis. 2. Distal small vessel atheromatous irregularity. Electronically Signed   By: Virgia Griffins M.D.   On: 06/26/2021 19:18   MR ANGIO HEAD WO CONTRAST Result Date: 06/26/2021 CLINICAL DATA:  Initial evaluation for acute TIA. EXAM: MRI HEAD WITHOUT CONTRAST MRA HEAD WITHOUT CONTRAST TECHNIQUE: Multiplanar, multi-echo pulse sequences of the brain and surrounding structures were acquired without intravenous contrast. Angiographic images of the Circle of Willis were acquired using MRA technique without intravenous contrast. COMPARISON:  Prior CT from earlier the same day. FINDINGS: MRI HEAD FINDINGS Brain: Generalized age-related cerebral atrophy with moderate chronic small vessel ischemic disease. Small remote lacunar infarct present at the right frontal corona radiata. No evidence for acute or subacute ischemia. No other areas of chronic cortical infarction. No acute or chronic intracranial blood products. No mass lesion, mass effect or midline shift. No hydrocephalus or extra-axial fluid collection. Pituitary gland suprasellar region within normal limits. Vascular: Major intracranial vascular flow voids are maintained. Skull and upper cervical spine: Cranial junction with normal limits. Bone marrow signal intensity normal. No scalp soft tissue abnormality. Sinuses/Orbits: Prior bilateral ocular lens replacement. Paranasal sinuses are largely clear. No mastoid effusion. Other: None. MRA HEAD FINDINGS Anterior circulation: Both internal carotid arteries patent to the termini without stenosis or other abnormality. A1 segments, anterior communicating artery complex common anterior cerebral arteries patent without stenosis. Normal in stenosis or occlusion. No proximal MCA branch occlusion. Distal MCA branches perfused and symmetric. Distal small vessel atheromatous irregularity. Posterior circulation: Both vertebral arteries patent to  the vertebrobasilar junction without stenosis. Left vertebral artery dominant. Left PICA patent. Right PICA not well seen. Basilar patent to its distal aspect without stenosis. Superior cerebral arteries patent bilaterally. Right PCA supplied via the basilar. Fetal type origin of the left PCA. Both PCAs patent to their distal aspects without stenosis. Anatomic variants: As above.  No aneurysm. IMPRESSION: MRI HEAD IMPRESSION: 1. No acute intracranial abnormality. 2. Generalized age-related cerebral atrophy with moderate chronic small vessel ischemic disease. 3. Small remote lacunar infarct involving the right frontal corona radiata. MRA HEAD IMPRESSION: 1. Negative intracranial MRA for large vessel occlusion. No hemodynamically significant or correctable stenosis. 2. Distal small vessel atheromatous irregularity. Electronically Signed   By: Virgia Griffins M.D.   On: 06/26/2021 19:18   CT HEAD WO CONTRAST Result Date: 06/26/2021 CLINICAL DATA:  Provided history:  Transient ischemic attack. Additional history provided: Blurry vision in left eye lasting several minutes. EXAM: CT HEAD WITHOUT CONTRAST TECHNIQUE: Contiguous axial images were obtained from the base of the skull through the vertex without intravenous contrast. RADIATION DOSE REDUCTION: This exam was performed according to the departmental dose-optimization program which includes automated exposure control, adjustment of the mA and/or kV according to patient size and/or use of iterative reconstruction technique. COMPARISON:  Report from head CT 09/17/1998 (images unavailable). FINDINGS: Brain: Mild-to-moderate generalized cerebral atrophy. Advanced patchy and confluent hypoattenuation within the cerebral white matter, nonspecific but compatible with chronic small vessel ischemic disease. 4 mm parenchymal calcification within the right frontal lobe periventricular white matter, likely benign (series 3, image 20). There is no acute intracranial  hemorrhage. No demarcated cortical infarct. No extra-axial fluid collection. No evidence of an intracranial mass. No midline shift. Vascular: No hyperdense vessel. Atherosclerotic calcifications. Skull: No fracture or aggressive osseous lesion. Sinuses/Orbits: No mass or acute finding within the imaged orbits. Minimal mucosal thickening within the left frontal ethmoidal recess IMPRESSION: No evidence of acute intracranial abnormality. Advanced chronic small vessel ischemic changes within the cerebral white matter. Mild-to-moderate generalized cerebral atrophy. Electronically Signed   By: Bascom Lily D.O.   On: 06/26/2021 16:35    Assessment & Plan:   Problem List Items Addressed This Visit     Insomnia   On Zolpidem  CR 12.5 mg at bedtime  Potential benefits of a long term benzodiazepines  use as well as potential risks  and complications were explained to the patient and were aknowledged.       HTN (hypertension) - Primary   Cont on Bystolic , Cardizem  CD Start Losartan        Atrial fibrillation (HCC)    Cont on Xarelto , Bystolic , Cartia       Chronic anticoagulation   On Xarelto       Elevated glucose   Check A1c      Well adult exam     We discussed age appropriate health related issues, including available/recomended screening tests and vaccinations. Labs were ordered to be later reviewed . All questions were answered. We discussed one or more of the following - seat belt use, use of sunscreen/sun exposure exercise, fall risk reduction, second hand smoke exposure, firearm use and storage, seat belt use, a need for adhering to healthy diet and exercise. Labs were ordered.  All questions were answered. Get a tDAP 2022 Shinggrix Eye exam, mammo      Osteoporosis   Cont w/Prolia .          Meds ordered this encounter  Medications   zolpidem  (AMBIEN  CR) 12.5 MG CR tablet    Sig: Take 1 tablet (12.5 mg total) by mouth at bedtime as needed. for sleep    Dispense:  100  tablet    Refill:  1      Follow-up: No follow-ups on file.  Anitra Barn, MD

## 2023-05-31 NOTE — Assessment & Plan Note (Signed)
 Cont w/Prolia .

## 2023-05-31 NOTE — Assessment & Plan Note (Signed)
Cont on Bystolic, Cardizem CD Start Losartan

## 2023-06-01 ENCOUNTER — Other Ambulatory Visit (HOSPITAL_COMMUNITY): Payer: Self-pay

## 2023-06-08 ENCOUNTER — Ambulatory Visit: Admitting: Internal Medicine

## 2023-07-12 ENCOUNTER — Other Ambulatory Visit (INDEPENDENT_AMBULATORY_CARE_PROVIDER_SITE_OTHER)

## 2023-07-12 DIAGNOSIS — I10 Essential (primary) hypertension: Secondary | ICD-10-CM | POA: Diagnosis not present

## 2023-07-12 DIAGNOSIS — I480 Paroxysmal atrial fibrillation: Secondary | ICD-10-CM

## 2023-07-12 DIAGNOSIS — Z0000 Encounter for general adult medical examination without abnormal findings: Secondary | ICD-10-CM

## 2023-07-12 LAB — LIPID PANEL
Cholesterol: 169 mg/dL (ref 0–200)
HDL: 88.9 mg/dL (ref >39.00)
LDL Cholesterol: 64 mg/dL (ref 0–99)
NonHDL: 80.36
Total CHOL/HDL Ratio: 2
Triglycerides: 80 mg/dL (ref 0.0–149.0)
VLDL: 16 mg/dL (ref 0.0–40.0)

## 2023-07-12 LAB — CBC WITH DIFFERENTIAL/PLATELET
Basophils Absolute: 0 10*3/uL (ref 0.0–0.1)
Basophils Relative: 0.7 % (ref 0.0–3.0)
Eosinophils Absolute: 0.1 10*3/uL (ref 0.0–0.7)
Eosinophils Relative: 2.8 % (ref 0.0–5.0)
HCT: 42 % (ref 36.0–46.0)
Hemoglobin: 13.9 g/dL (ref 12.0–15.0)
Lymphocytes Relative: 34.5 % (ref 12.0–46.0)
Lymphs Abs: 1.4 10*3/uL (ref 0.7–4.0)
MCHC: 33 g/dL (ref 30.0–36.0)
MCV: 98.7 fl (ref 78.0–100.0)
Monocytes Absolute: 0.4 10*3/uL (ref 0.1–1.0)
Monocytes Relative: 8.4 % (ref 3.0–12.0)
Neutro Abs: 2.2 10*3/uL (ref 1.4–7.7)
Neutrophils Relative %: 53.6 % (ref 43.0–77.0)
Platelets: 169 10*3/uL (ref 150.0–400.0)
RBC: 4.25 Mil/uL (ref 3.87–5.11)
RDW: 14.2 % (ref 11.5–15.5)
WBC: 4.2 10*3/uL (ref 4.0–10.5)

## 2023-07-12 LAB — COMPREHENSIVE METABOLIC PANEL WITH GFR
ALT: 21 U/L (ref 0–35)
AST: 30 U/L (ref 0–37)
Albumin: 4.3 g/dL (ref 3.5–5.2)
Alkaline Phosphatase: 53 U/L (ref 39–117)
BUN: 17 mg/dL (ref 6–23)
CO2: 25 meq/L (ref 19–32)
Calcium: 9.2 mg/dL (ref 8.4–10.5)
Chloride: 105 meq/L (ref 96–112)
Creatinine, Ser: 0.97 mg/dL (ref 0.40–1.20)
GFR: 53.22 mL/min — ABNORMAL LOW
Glucose, Bld: 112 mg/dL — ABNORMAL HIGH (ref 70–99)
Potassium: 3.9 meq/L (ref 3.5–5.1)
Sodium: 139 meq/L (ref 135–145)
Total Bilirubin: 0.7 mg/dL (ref 0.2–1.2)
Total Protein: 7.3 g/dL (ref 6.0–8.3)

## 2023-07-12 LAB — URINALYSIS, ROUTINE W REFLEX MICROSCOPIC
Bilirubin Urine: NEGATIVE
Hgb urine dipstick: NEGATIVE
Ketones, ur: NEGATIVE
Nitrite: NEGATIVE
RBC / HPF: NONE SEEN (ref 0–?)
Specific Gravity, Urine: <=1.005 — AB (ref 1.000–1.030)
Total Protein, Urine: NEGATIVE
Urine Glucose: NEGATIVE
Urobilinogen, UA: 0.2 (ref 0.0–1.0)
pH: 6 (ref 5.0–8.0)

## 2023-07-12 LAB — TSH: TSH: 1.23 u[IU]/mL (ref 0.35–5.50)

## 2023-07-13 ENCOUNTER — Ambulatory Visit: Payer: Self-pay | Admitting: Internal Medicine

## 2023-07-13 DIAGNOSIS — H524 Presbyopia: Secondary | ICD-10-CM | POA: Diagnosis not present

## 2023-09-15 ENCOUNTER — Telehealth: Payer: Self-pay

## 2023-09-15 NOTE — Telephone Encounter (Signed)
 Prolia  VOB initiated via [email or web address]  Next Prolia  inj DUE: 10/16/23

## 2023-09-16 ENCOUNTER — Other Ambulatory Visit (HOSPITAL_COMMUNITY): Payer: Self-pay

## 2023-09-16 NOTE — Telephone Encounter (Addendum)
 PA for medical (buy and bill) submitted via [employer] Case#: [id number]   APPROVED    PA for pharmacy submitted via latent. Key: [id number]   APPROVED. 09/16/23-09/15/24. PA #: [id number]

## 2023-09-19 ENCOUNTER — Other Ambulatory Visit (HOSPITAL_COMMUNITY): Payer: Self-pay

## 2023-09-19 NOTE — Telephone Encounter (Signed)
 SABRA

## 2023-09-19 NOTE — Telephone Encounter (Signed)
 Pt ready for scheduling for PROLIA  on or after : 10/16/23  Option# 1: Buy/Bill (Office supplied medication)  Out-of-pocket cost due at time of clinic visit: $332  Number of injection/visits approved: 2  Primary: [employer] Prolia  co-insurance: 20% Admin fee co-insurance: 0%  Secondary: --- Prolia  co-insurance:  Admin fee co-insurance:   Medical Benefit Details: Date Benefits were checked: 09/19/23 Deductible: NO/ Coinsurance: 20%/ Admin Fee: 0%  Prior Auth: APPROVED PA# [id number]  Expiration Date: 09/16/23-09/15/24  # of doses approved: 2 ----------------------------------------------------------------------- Option# 2- Med Obtained from pharmacy:  Pharmacy benefit: Copay $277.96 (Paid to pharmacy) Admin Fee: 0% (Pay at clinic)  Prior Auth: APPROVED PA# [id number]  Expiration Date: 09/16/23-09/15/24  # of doses approved: 2   If patient wants fill through the pharmacy benefit please send prescription to: [facility], and include estimated need by date in rx notes. Pharmacy will ship medication directly to the office.  Patient NOT eligible for Prolia  Copay Card. Copay Card can make patient's cost as little as $25. Link to apply: [email or web address]://www.amgensupportplus.com/copay  ** This summary of benefits is an estimation of the patient's out-of-pocket cost. Exact cost may very based on individual plan coverage.

## 2023-10-03 ENCOUNTER — Telehealth: Payer: Self-pay

## 2023-10-03 NOTE — Telephone Encounter (Signed)
 Copied from CRM [id number]. Topic: Clinical - Medical Advice >> Oct 03, 2023 10:31 AM Selena Taylor wrote: Reason for CRM: Pt called in wanting to know when her last and next bone density test is. Per her chart it looks like a order was put in March 21st 2025 at pt medicare wellness visit. Not sure if there was a order put in for the next test or when the next test is. Informed pt that someone from clinic will give her a call soon.

## 2023-10-04 ENCOUNTER — Telehealth: Payer: Self-pay | Admitting: Student in an Organized Health Care Education/Training Program

## 2023-10-04 ENCOUNTER — Other Ambulatory Visit: Payer: Self-pay | Admitting: Internal Medicine

## 2023-10-04 ENCOUNTER — Other Ambulatory Visit: Payer: Self-pay

## 2023-10-04 DIAGNOSIS — I480 Paroxysmal atrial fibrillation: Secondary | ICD-10-CM

## 2023-10-04 MED ORDER — RIVAROXABAN 15 MG PO TABS: 15.0000 mg | ORAL_TABLET | Freq: Every day | ORAL | 1 refills | Status: AC

## 2023-10-04 NOTE — Telephone Encounter (Signed)
 Prescription refill request for Xarelto  received.  Indication:afib Last office visit:1/25 Weight:53.1  kg Age:88 Scr:0.97  6/25 CrCl:35.54  ml/min  Prescription refilled

## 2023-10-04 NOTE — Telephone Encounter (Signed)
*  STAT* If patient is at the pharmacy, call can be transferred to refill team.   1. Which medications need to be refilled? (please list name of each medication and dose if known)   XARELTO  15 MG TABS tablet    4. Which pharmacy/location (including street and city if local pharmacy) is medication to be sent to?  [id number] - [place], [place] - [address] AT [facility] OF [address]     5. Do they need a 30 day or 90 day supply? 90   Pt called in stating she is completely out, scheduled 12/29

## 2023-10-06 NOTE — Telephone Encounter (Signed)
 Per DPR can leave detailed message  LM for pt last DEXA was 2018 and it was recommended every 2 years so pt is due. Looks like order was placed in March but when they called pt declined scheduling.advised if she does want this order replaced to call and let us  know

## 2023-10-13 DIAGNOSIS — Z85828 Personal history of other malignant neoplasm of skin: Secondary | ICD-10-CM | POA: Diagnosis not present

## 2023-10-13 DIAGNOSIS — L590 Erythema ab igne [dermatitis ab igne]: Secondary | ICD-10-CM | POA: Diagnosis not present

## 2023-10-13 DIAGNOSIS — L570 Actinic keratosis: Secondary | ICD-10-CM | POA: Diagnosis not present

## 2023-10-13 DIAGNOSIS — L720 Epidermal cyst: Secondary | ICD-10-CM | POA: Diagnosis not present

## 2023-10-13 DIAGNOSIS — Z85820 Personal history of malignant melanoma of skin: Secondary | ICD-10-CM | POA: Diagnosis not present

## 2023-10-18 ENCOUNTER — Other Ambulatory Visit: Payer: Self-pay | Admitting: Internal Medicine

## 2023-10-19 NOTE — Telephone Encounter (Signed)
 Medication: Ambien   Directions: Take 1 tablet (12.5 mg total) by mouth at bedtime as needed.  Last given: 05/31/23 Number refills: 1 Last o/v:  Follow up:  Labs:

## 2023-11-24 ENCOUNTER — Ambulatory Visit

## 2023-12-01 ENCOUNTER — Ambulatory Visit

## 2023-12-05 ENCOUNTER — Ambulatory Visit

## 2023-12-05 DIAGNOSIS — Z23 Encounter for immunization: Secondary | ICD-10-CM | POA: Diagnosis not present

## 2023-12-05 DIAGNOSIS — M25519 Pain in unspecified shoulder: Secondary | ICD-10-CM | POA: Insufficient documentation

## 2023-12-05 NOTE — Progress Notes (Cosign Needed Addendum)
Pt was given HD flu vaccine w/o any complications.  Medical screening examination/treatment/procedure(s) were performed by non-physician practitioner and as supervising physician I was immediately available for consultation/collaboration.  I agree with above. Jacinta Shoe, MD

## 2023-12-06 ENCOUNTER — Other Ambulatory Visit: Payer: Self-pay | Admitting: Internal Medicine

## 2023-12-06 MED ORDER — ZOLPIDEM TARTRATE 10 MG PO TABS: 10.0000 mg | ORAL_TABLET | Freq: Every evening | ORAL | 0 refills | Status: AC | PRN

## 2023-12-06 NOTE — Progress Notes (Signed)
 Changed to Zolpidem  10 per pt's request

## 2024-01-23 ENCOUNTER — Ambulatory Visit
Attending: Student in an Organized Health Care Education/Training Program | Admitting: Student in an Organized Health Care Education/Training Program

## 2024-01-23 ENCOUNTER — Encounter: Payer: Self-pay | Admitting: Student in an Organized Health Care Education/Training Program

## 2024-01-23 VITALS — BP 146/70 | HR 96 | Ht 62.0 in | Wt 127.6 lb

## 2024-01-23 DIAGNOSIS — I4821 Permanent atrial fibrillation: Secondary | ICD-10-CM

## 2024-01-23 DIAGNOSIS — R001 Bradycardia, unspecified: Secondary | ICD-10-CM

## 2024-01-23 NOTE — Patient Instructions (Signed)

## 2024-01-23 NOTE — Progress Notes (Unsigned)
 " Cardiology Office Note   Date: 01/23/24 ID:  Selena Taylor, DOB August 06, 1935, MRN [id number] PCP: [name], MD  [facility] Providers Cardiologist:  Elspeth Sage, MD (Inactive) Electrophysiologist:  Donnice DELENA Primus, MD    History of Present Illness [phone] with permanent AF, prior pericarditis requiring pericardial window, HTN, prior TIA, bradycardia, CKD and fatigue who presents for routine follow-up.   Discussed the use of AI scribe software for clinical note transcription with the patient, who gave verbal consent to proceed. History of Present Illness Selena Taylor is an 88 year old female with atrial fibrillation who presents with fatigue.  She has experienced increased fatigue over the past year, feeling more tired than usual. Despite this, she continues to perform daily activities such as grocery shopping, cleaning, and attending church. Her oxygen levels were noted to be slightly low, but she manages her daily tasks independently.  She has a history of atrial fibrillation and is currently on Xarelto  (rivaroxaban ) and diltiazem , with no reported issues. She also takes Bystolic . In September, she sustained a leg injury at [facility], resulting in significant bleeding, but it has since healed.  She experiences intermittent back spasms every four to five days. Various topical treatments and a heating pad provide some relief, but she avoids muscle relaxants due to their side effects.  Her social history includes living with her son since her husband passed away ten years ago. She plans to move to an assisted living facility by the end of the summer. She remains active in her community and church, with family, including her daughter and great-granddaughter, living nearby.  ROS: fatigue   Studies Reviewed  ECG (01/23/24): AF/VR 98, QRS 76, QT/c 306/390  TTE Result date: 05/09/20  1. Left ventricular ejection fraction, by estimation, is 55 to 60%. The  left ventricle has  normal function. The left ventricle has no regional  wall motion abnormalities. Diastolic function indeterminant due to atrial  fibrillation.   2. Right ventricular systolic function is normal. The right ventricular  size is normal. There is normal pulmonary artery systolic pressure. The  estimated right ventricular systolic pressure is 35.7 mmHg.   3. Left atrial size was severely dilated.   4. Right atrial size was severely dilated.   5. The mitral valve is grossly normal. Mild mitral valve regurgitation.  Moderate mitral annular calcification.   6. The aortic valve is tricuspid. There is mild calcification of the  aortic valve. There is mild thickening of the aortic valve. Aortic valve  regurgitation is trivial. Mild to moderate aortic valve  sclerosis/calcification is present, without any  evidence of aortic stenosis.   7. The inferior vena cava is dilated in size with >50% respiratory  variability, suggesting right atrial pressure of 8 mmHg.   Risk Assessment/Calculations  CHA2DS2-VASc Score = 6  This indicates a 9.7% annual risk of stroke. The patient's score is based upon: CHF History: 0 HTN History: 1 Diabetes History: 0 Stroke History: 2 Vascular Disease History: 0 Age Score: 2 Gender Score: 1  Physical Exam VS:  BP (!) 146/70 (BP Location: Right Arm, Patient Position: Sitting)   Pulse 96   Ht 5' 2 (1.575 m)   Wt 127 lb 9.6 oz (57.9 kg)   SpO2 98%   BMI 23.34 kg/m   Wt Readings from Last 3 Encounters:  01/23/24 127 lb 9.6 oz (57.9 kg)  05/31/23 117 lb (53.1 kg)  04/15/23 115 lb 9.6 oz (52.4 kg)    GEN:  Well nourished, well developed in no acute distress NECK: No JVD; No carotid bruits CARDIAC: irregular rhythm, rate controlled, no murmurs, rubs, gallops RESPIRATORY:  Clear to auscultation without rales, wheezing or rhonchi  ABDOMEN: Soft, non-tender, non-distended EXTREMITIES:  No edema; No deformity   ASSESSMENT AND PLAN 60F with permanent AF, prior  pericarditis requiring pericardial window, HTN, prior TIA, bradycardia, CKD and fatigue who presents for routine follow-up.  Assessment & Plan Permanent atrial fibrillation Chronic atrial fibrillation managed with rivaroxaban  and diltiazem . No anticoagulation issues or side effects. Fatigue noted.  Overall she has been doing fairly well and is planning to move to an assisted living facility at the next 6 months and her son is going to move into her house.  She is looking forward to this as she is tired of keeping up the house.  She remains in permanent AF with severe LA dilation and no further attempts at rhythm control. She is adequately rate controlled. - Continue rivaroxaban  for anticoagulation. - Continue diltiazem  for rate control. - Monitor for bleeding or stroke signs.  Dispo: RTC 1 yr  A total of 25 minutes was spent preparing for the patient, reviewing history, performing exam, document encounter, coordinating care and counseling the patient. 17 minutes was spent with direct patient care.   Signed, Donnice DELENA Primus, MD  "

## 2024-02-29 ENCOUNTER — Other Ambulatory Visit: Payer: Self-pay | Admitting: Internal Medicine

## 2024-03-02 ENCOUNTER — Telehealth: Payer: Self-pay

## 2024-03-02 NOTE — Telephone Encounter (Signed)
 Copied from CRM [id number]. Topic: Clinical - Medication Question >> Mar 02, 2024 11:50 AM Suzen RAMAN wrote: Reason for CRM: Patient called to check on the status of medication refill for  Zolpidem  Tartrate 10 MG. Patient advised per chart that medication refill is still currently pending provider approval.

## 2024-04-17 ENCOUNTER — Ambulatory Visit
